# Patient Record
Sex: Female | Born: 1955 | Race: White | Hispanic: No | Marital: Married | Smoking: Former smoker
Health system: Southern US, Community
[De-identification: ages and names within clinical notes are randomized; demographics above are authoritative.]

## PROBLEM LIST (undated history)

## (undated) DIAGNOSIS — Z973 Presence of spectacles and contact lenses: Secondary | ICD-10-CM

## (undated) DIAGNOSIS — I82409 Acute embolism and thrombosis of unspecified deep veins of unspecified lower extremity: Secondary | ICD-10-CM

## (undated) DIAGNOSIS — S82899A Other fracture of unspecified lower leg, initial encounter for closed fracture: Secondary | ICD-10-CM

## (undated) DIAGNOSIS — I469 Cardiac arrest, cause unspecified: Secondary | ICD-10-CM

## (undated) DIAGNOSIS — G4730 Sleep apnea, unspecified: Secondary | ICD-10-CM

## (undated) DIAGNOSIS — I739 Peripheral vascular disease, unspecified: Secondary | ICD-10-CM

## (undated) DIAGNOSIS — I219 Acute myocardial infarction, unspecified: Secondary | ICD-10-CM

## (undated) DIAGNOSIS — I10 Essential (primary) hypertension: Secondary | ICD-10-CM

## (undated) DIAGNOSIS — E119 Type 2 diabetes mellitus without complications: Secondary | ICD-10-CM

## (undated) DIAGNOSIS — F419 Anxiety disorder, unspecified: Secondary | ICD-10-CM

## (undated) DIAGNOSIS — S065X9A Traumatic subdural hemorrhage with loss of consciousness of unspecified duration, initial encounter: Secondary | ICD-10-CM

## (undated) DIAGNOSIS — E7800 Pure hypercholesterolemia, unspecified: Secondary | ICD-10-CM

## (undated) DIAGNOSIS — F32A Depression, unspecified: Secondary | ICD-10-CM

## (undated) DIAGNOSIS — E669 Obesity, unspecified: Secondary | ICD-10-CM

## (undated) DIAGNOSIS — F329 Major depressive disorder, single episode, unspecified: Secondary | ICD-10-CM

## (undated) DIAGNOSIS — I2510 Atherosclerotic heart disease of native coronary artery without angina pectoris: Secondary | ICD-10-CM

## (undated) DIAGNOSIS — N189 Chronic kidney disease, unspecified: Secondary | ICD-10-CM

## (undated) DIAGNOSIS — Z5189 Encounter for other specified aftercare: Secondary | ICD-10-CM

## (undated) DIAGNOSIS — K219 Gastro-esophageal reflux disease without esophagitis: Secondary | ICD-10-CM

## (undated) DIAGNOSIS — T8859XA Other complications of anesthesia, initial encounter: Secondary | ICD-10-CM

## (undated) DIAGNOSIS — I6529 Occlusion and stenosis of unspecified carotid artery: Secondary | ICD-10-CM

## (undated) DIAGNOSIS — I701 Atherosclerosis of renal artery: Secondary | ICD-10-CM

## (undated) DIAGNOSIS — I639 Cerebral infarction, unspecified: Secondary | ICD-10-CM

## (undated) DIAGNOSIS — G931 Anoxic brain damage, not elsewhere classified: Secondary | ICD-10-CM

## (undated) DIAGNOSIS — M7580 Other shoulder lesions, unspecified shoulder: Secondary | ICD-10-CM

## (undated) DIAGNOSIS — T4145XA Adverse effect of unspecified anesthetic, initial encounter: Secondary | ICD-10-CM

## (undated) DIAGNOSIS — K759 Inflammatory liver disease, unspecified: Secondary | ICD-10-CM

## (undated) HISTORY — DX: Obesity, unspecified: E66.9

## (undated) HISTORY — PX: SPINE SURGERY: SHX786

## (undated) HISTORY — PX: CHOLECYSTECTOMY: SHX55

## (undated) HISTORY — DX: Essential (primary) hypertension: I10

## (undated) HISTORY — DX: Acute embolism and thrombosis of unspecified deep veins of unspecified lower extremity: I82.409

## (undated) HISTORY — DX: Occlusion and stenosis of unspecified carotid artery: I65.29

## (undated) HISTORY — DX: Anoxic brain damage, not elsewhere classified: G93.1

## (undated) HISTORY — DX: Other fracture of unspecified lower leg, initial encounter for closed fracture: S82.899A

## (undated) HISTORY — DX: Peripheral vascular disease, unspecified: I73.9

## (undated) HISTORY — PX: ABDOMINOPLASTY/PANNICULECTOMY: SHX5578

## (undated) HISTORY — DX: Acute myocardial infarction, unspecified: I21.9

## (undated) HISTORY — DX: Atherosclerosis of renal artery: I70.1

## (undated) HISTORY — DX: Other shoulder lesions, unspecified shoulder: M75.80

## (undated) HISTORY — DX: Cerebral infarction, unspecified: I63.9

## (undated) HISTORY — PX: POSTERIOR FUSION LUMBAR SPINE: SUR632

## (undated) HISTORY — DX: Anxiety disorder, unspecified: F41.9

## (undated) HISTORY — PX: BACK SURGERY: SHX140

## (undated) HISTORY — DX: Traumatic subdural hemorrhage with loss of consciousness of unspecified duration, initial encounter: S06.5X9A

---

## 1981-10-03 HISTORY — PX: OVARIAN CYST REMOVAL: SHX89

## 1998-10-03 HISTORY — PX: ABDOMINOPLASTY/PANNICULECTOMY: SHX5578

## 1998-10-03 HISTORY — PX: OTHER SURGICAL HISTORY: SHX169

## 2007-04-03 ENCOUNTER — Other Ambulatory Visit: Admission: RE | Admit: 2007-04-03 | Discharge: 2007-04-03 | Payer: Self-pay | Admitting: Family Medicine

## 2007-04-17 ENCOUNTER — Ambulatory Visit (HOSPITAL_COMMUNITY): Admission: RE | Admit: 2007-04-17 | Discharge: 2007-04-17 | Payer: Self-pay | Admitting: Family Medicine

## 2008-02-07 ENCOUNTER — Emergency Department (HOSPITAL_COMMUNITY): Admission: EM | Admit: 2008-02-07 | Discharge: 2008-02-07 | Payer: Self-pay | Admitting: Emergency Medicine

## 2008-06-04 ENCOUNTER — Other Ambulatory Visit: Admission: RE | Admit: 2008-06-04 | Discharge: 2008-06-04 | Payer: Self-pay | Admitting: Family Medicine

## 2009-02-26 ENCOUNTER — Ambulatory Visit (HOSPITAL_COMMUNITY): Admission: RE | Admit: 2009-02-26 | Discharge: 2009-02-26 | Payer: Self-pay | Admitting: Family Medicine

## 2009-10-03 DIAGNOSIS — I639 Cerebral infarction, unspecified: Secondary | ICD-10-CM

## 2009-10-03 HISTORY — DX: Cerebral infarction, unspecified: I63.9

## 2010-06-03 ENCOUNTER — Ambulatory Visit (HOSPITAL_COMMUNITY): Admission: RE | Admit: 2010-06-03 | Discharge: 2010-06-03 | Payer: Self-pay | Admitting: Family Medicine

## 2010-08-03 DIAGNOSIS — G931 Anoxic brain damage, not elsewhere classified: Secondary | ICD-10-CM

## 2010-08-03 HISTORY — DX: Anoxic brain damage, not elsewhere classified: G93.1

## 2010-08-04 DIAGNOSIS — I469 Cardiac arrest, cause unspecified: Secondary | ICD-10-CM

## 2010-08-04 HISTORY — DX: Cardiac arrest, cause unspecified: I46.9

## 2010-09-02 DIAGNOSIS — Z5189 Encounter for other specified aftercare: Secondary | ICD-10-CM

## 2010-09-02 DIAGNOSIS — IMO0001 Reserved for inherently not codable concepts without codable children: Secondary | ICD-10-CM

## 2010-09-02 HISTORY — PX: CORONARY ARTERY BYPASS GRAFT: SHX141

## 2010-09-02 HISTORY — DX: Encounter for other specified aftercare: Z51.89

## 2010-09-02 HISTORY — DX: Reserved for inherently not codable concepts without codable children: IMO0001

## 2010-09-27 DIAGNOSIS — I219 Acute myocardial infarction, unspecified: Secondary | ICD-10-CM

## 2010-09-27 HISTORY — DX: Acute myocardial infarction, unspecified: I21.9

## 2010-10-03 DIAGNOSIS — F419 Anxiety disorder, unspecified: Secondary | ICD-10-CM

## 2010-10-03 HISTORY — DX: Anxiety disorder, unspecified: F41.9

## 2010-10-21 ENCOUNTER — Encounter
Admission: RE | Admit: 2010-10-21 | Discharge: 2010-11-02 | Payer: Self-pay | Source: Home / Self Care | Attending: Family Medicine | Admitting: Family Medicine

## 2010-11-03 ENCOUNTER — Ambulatory Visit: Payer: PRIVATE HEALTH INSURANCE | Attending: Family Medicine | Admitting: Speech Pathology

## 2010-11-03 ENCOUNTER — Ambulatory Visit: Payer: PRIVATE HEALTH INSURANCE | Admitting: Occupational Therapy

## 2010-11-03 DIAGNOSIS — R5381 Other malaise: Secondary | ICD-10-CM | POA: Insufficient documentation

## 2010-11-03 DIAGNOSIS — R269 Unspecified abnormalities of gait and mobility: Secondary | ICD-10-CM | POA: Insufficient documentation

## 2010-11-03 DIAGNOSIS — I69928 Other speech and language deficits following unspecified cerebrovascular disease: Secondary | ICD-10-CM | POA: Insufficient documentation

## 2010-11-03 DIAGNOSIS — Z5189 Encounter for other specified aftercare: Secondary | ICD-10-CM | POA: Insufficient documentation

## 2010-11-05 ENCOUNTER — Ambulatory Visit
Admission: RE | Admit: 2010-11-05 | Discharge: 2010-11-05 | Disposition: A | Discharge status: Home / Self Care | Payer: PRIVATE HEALTH INSURANCE | Source: Ambulatory Visit

## 2010-11-05 ENCOUNTER — Ambulatory Visit: Payer: PRIVATE HEALTH INSURANCE | Admitting: Physical Therapy

## 2010-11-05 ENCOUNTER — Other Ambulatory Visit: Payer: Self-pay | Admitting: Family Medicine

## 2010-11-05 ENCOUNTER — Ambulatory Visit: Payer: PRIVATE HEALTH INSURANCE

## 2010-11-05 ENCOUNTER — Ambulatory Visit: Payer: Self-pay | Admitting: Physical Therapy

## 2010-11-05 ENCOUNTER — Ambulatory Visit: Payer: PRIVATE HEALTH INSURANCE | Admitting: Occupational Therapy

## 2010-11-05 DIAGNOSIS — R05 Cough: Secondary | ICD-10-CM

## 2010-11-05 DIAGNOSIS — R059 Cough, unspecified: Secondary | ICD-10-CM

## 2010-11-09 ENCOUNTER — Ambulatory Visit: Payer: PRIVATE HEALTH INSURANCE | Admitting: Physical Therapy

## 2010-11-09 ENCOUNTER — Ambulatory Visit: Payer: PRIVATE HEALTH INSURANCE

## 2010-11-09 ENCOUNTER — Ambulatory Visit: Payer: PRIVATE HEALTH INSURANCE | Admitting: Occupational Therapy

## 2010-11-09 ENCOUNTER — Ambulatory Visit: Payer: Self-pay | Admitting: Physical Therapy

## 2010-11-11 ENCOUNTER — Ambulatory Visit: Payer: PRIVATE HEALTH INSURANCE | Admitting: Occupational Therapy

## 2010-11-11 ENCOUNTER — Ambulatory Visit: Payer: PRIVATE HEALTH INSURANCE | Admitting: Physical Therapy

## 2010-11-11 ENCOUNTER — Ambulatory Visit: Payer: Self-pay | Admitting: Physical Therapy

## 2010-11-11 ENCOUNTER — Ambulatory Visit: Payer: PRIVATE HEALTH INSURANCE

## 2010-11-12 ENCOUNTER — Ambulatory Visit: Payer: PRIVATE HEALTH INSURANCE | Admitting: Occupational Therapy

## 2010-11-12 ENCOUNTER — Ambulatory Visit: Payer: PRIVATE HEALTH INSURANCE | Admitting: Physical Therapy

## 2010-11-12 ENCOUNTER — Ambulatory Visit: Payer: PRIVATE HEALTH INSURANCE

## 2010-11-15 ENCOUNTER — Ambulatory Visit: Payer: Self-pay | Admitting: Physical Therapy

## 2010-11-15 ENCOUNTER — Ambulatory Visit: Payer: PRIVATE HEALTH INSURANCE

## 2010-11-15 ENCOUNTER — Ambulatory Visit: Payer: PRIVATE HEALTH INSURANCE | Admitting: Physical Therapy

## 2010-11-15 ENCOUNTER — Ambulatory Visit: Payer: PRIVATE HEALTH INSURANCE | Admitting: Occupational Therapy

## 2010-11-16 ENCOUNTER — Ambulatory Visit: Payer: PRIVATE HEALTH INSURANCE | Admitting: Physical Therapy

## 2010-11-16 ENCOUNTER — Ambulatory Visit: Payer: PRIVATE HEALTH INSURANCE

## 2010-11-16 ENCOUNTER — Ambulatory Visit: Payer: PRIVATE HEALTH INSURANCE | Admitting: Occupational Therapy

## 2010-11-16 ENCOUNTER — Ambulatory Visit: Payer: Self-pay | Admitting: Physical Therapy

## 2010-11-18 ENCOUNTER — Ambulatory Visit: Payer: PRIVATE HEALTH INSURANCE | Admitting: Physical Therapy

## 2010-11-18 ENCOUNTER — Ambulatory Visit: Payer: PRIVATE HEALTH INSURANCE | Admitting: Occupational Therapy

## 2010-11-18 ENCOUNTER — Ambulatory Visit: Payer: PRIVATE HEALTH INSURANCE

## 2010-11-23 ENCOUNTER — Ambulatory Visit: Payer: PRIVATE HEALTH INSURANCE

## 2010-11-23 ENCOUNTER — Ambulatory Visit: Payer: PRIVATE HEALTH INSURANCE | Admitting: Physical Therapy

## 2010-11-23 ENCOUNTER — Ambulatory Visit: Payer: PRIVATE HEALTH INSURANCE | Admitting: Occupational Therapy

## 2010-11-25 ENCOUNTER — Encounter: Payer: Self-pay | Admitting: Occupational Therapy

## 2010-11-25 ENCOUNTER — Ambulatory Visit: Payer: Self-pay | Admitting: Physical Therapy

## 2010-11-26 ENCOUNTER — Encounter: Payer: Self-pay | Admitting: Occupational Therapy

## 2010-11-26 ENCOUNTER — Ambulatory Visit: Payer: Self-pay | Admitting: Physical Therapy

## 2010-11-29 ENCOUNTER — Ambulatory Visit: Payer: Self-pay | Admitting: Physical Therapy

## 2010-11-29 ENCOUNTER — Encounter: Payer: Self-pay | Admitting: Occupational Therapy

## 2010-11-30 ENCOUNTER — Ambulatory Visit: Payer: PRIVATE HEALTH INSURANCE

## 2010-11-30 ENCOUNTER — Ambulatory Visit: Payer: PRIVATE HEALTH INSURANCE | Admitting: Physical Therapy

## 2010-11-30 ENCOUNTER — Encounter: Payer: Self-pay | Admitting: Occupational Therapy

## 2010-12-02 ENCOUNTER — Ambulatory Visit: Payer: PRIVATE HEALTH INSURANCE | Attending: Family Medicine | Admitting: Occupational Therapy

## 2010-12-02 ENCOUNTER — Ambulatory Visit: Payer: Self-pay | Admitting: Physical Therapy

## 2010-12-02 ENCOUNTER — Ambulatory Visit: Payer: PRIVATE HEALTH INSURANCE

## 2010-12-02 DIAGNOSIS — I69928 Other speech and language deficits following unspecified cerebrovascular disease: Secondary | ICD-10-CM | POA: Insufficient documentation

## 2010-12-02 DIAGNOSIS — Z5189 Encounter for other specified aftercare: Secondary | ICD-10-CM | POA: Insufficient documentation

## 2010-12-02 DIAGNOSIS — R269 Unspecified abnormalities of gait and mobility: Secondary | ICD-10-CM | POA: Insufficient documentation

## 2010-12-02 DIAGNOSIS — R5381 Other malaise: Secondary | ICD-10-CM | POA: Insufficient documentation

## 2010-12-06 ENCOUNTER — Encounter: Payer: Self-pay | Admitting: Occupational Therapy

## 2010-12-06 ENCOUNTER — Ambulatory Visit: Payer: Self-pay | Admitting: Physical Therapy

## 2010-12-07 ENCOUNTER — Encounter: Payer: Self-pay | Admitting: Occupational Therapy

## 2010-12-07 ENCOUNTER — Ambulatory Visit: Payer: Self-pay | Admitting: Physical Therapy

## 2010-12-09 ENCOUNTER — Encounter: Payer: Self-pay | Admitting: Occupational Therapy

## 2010-12-09 ENCOUNTER — Ambulatory Visit: Payer: Self-pay | Admitting: Physical Therapy

## 2010-12-13 ENCOUNTER — Encounter (HOSPITAL_COMMUNITY): Payer: PRIVATE HEALTH INSURANCE

## 2010-12-13 ENCOUNTER — Ambulatory Visit (HOSPITAL_BASED_OUTPATIENT_CLINIC_OR_DEPARTMENT_OTHER): Payer: PRIVATE HEALTH INSURANCE | Admitting: Physical Medicine & Rehabilitation

## 2010-12-13 ENCOUNTER — Encounter: Payer: PRIVATE HEALTH INSURANCE | Attending: Physical Medicine & Rehabilitation

## 2010-12-13 DIAGNOSIS — F341 Dysthymic disorder: Secondary | ICD-10-CM | POA: Insufficient documentation

## 2010-12-13 DIAGNOSIS — R5381 Other malaise: Secondary | ICD-10-CM | POA: Insufficient documentation

## 2010-12-13 DIAGNOSIS — M6240 Contracture of muscle, unspecified site: Secondary | ICD-10-CM | POA: Insufficient documentation

## 2010-12-13 DIAGNOSIS — M7530 Calcific tendinitis of unspecified shoulder: Secondary | ICD-10-CM

## 2010-12-13 DIAGNOSIS — M67919 Unspecified disorder of synovium and tendon, unspecified shoulder: Secondary | ICD-10-CM | POA: Insufficient documentation

## 2010-12-13 DIAGNOSIS — R5383 Other fatigue: Secondary | ICD-10-CM | POA: Insufficient documentation

## 2010-12-13 DIAGNOSIS — Z951 Presence of aortocoronary bypass graft: Secondary | ICD-10-CM | POA: Insufficient documentation

## 2010-12-13 DIAGNOSIS — M719 Bursopathy, unspecified: Secondary | ICD-10-CM | POA: Insufficient documentation

## 2010-12-13 DIAGNOSIS — G931 Anoxic brain damage, not elsewhere classified: Secondary | ICD-10-CM | POA: Insufficient documentation

## 2010-12-13 DIAGNOSIS — M216X9 Other acquired deformities of unspecified foot: Secondary | ICD-10-CM | POA: Insufficient documentation

## 2010-12-13 DIAGNOSIS — Z5189 Encounter for other specified aftercare: Secondary | ICD-10-CM | POA: Insufficient documentation

## 2010-12-13 DIAGNOSIS — I252 Old myocardial infarction: Secondary | ICD-10-CM | POA: Insufficient documentation

## 2010-12-13 DIAGNOSIS — Z7982 Long term (current) use of aspirin: Secondary | ICD-10-CM | POA: Insufficient documentation

## 2010-12-13 DIAGNOSIS — I10 Essential (primary) hypertension: Secondary | ICD-10-CM | POA: Insufficient documentation

## 2010-12-13 DIAGNOSIS — R413 Other amnesia: Secondary | ICD-10-CM | POA: Insufficient documentation

## 2010-12-13 DIAGNOSIS — M79609 Pain in unspecified limb: Secondary | ICD-10-CM | POA: Insufficient documentation

## 2010-12-13 DIAGNOSIS — Z79899 Other long term (current) drug therapy: Secondary | ICD-10-CM | POA: Insufficient documentation

## 2010-12-13 DIAGNOSIS — E119 Type 2 diabetes mellitus without complications: Secondary | ICD-10-CM | POA: Insufficient documentation

## 2010-12-13 DIAGNOSIS — IMO0002 Reserved for concepts with insufficient information to code with codable children: Secondary | ICD-10-CM

## 2010-12-13 DIAGNOSIS — F09 Unspecified mental disorder due to known physiological condition: Secondary | ICD-10-CM | POA: Insufficient documentation

## 2010-12-14 ENCOUNTER — Ambulatory Visit: Payer: PRIVATE HEALTH INSURANCE

## 2010-12-14 ENCOUNTER — Ambulatory Visit: Payer: PRIVATE HEALTH INSURANCE | Admitting: Physical Therapy

## 2010-12-14 ENCOUNTER — Ambulatory Visit: Payer: PRIVATE HEALTH INSURANCE | Admitting: Occupational Therapy

## 2010-12-15 ENCOUNTER — Other Ambulatory Visit: Payer: Self-pay

## 2010-12-15 ENCOUNTER — Encounter (HOSPITAL_COMMUNITY): Payer: PRIVATE HEALTH INSURANCE | Attending: Cardiovascular Disease

## 2010-12-15 ENCOUNTER — Encounter (HOSPITAL_COMMUNITY): Payer: PRIVATE HEALTH INSURANCE

## 2010-12-15 DIAGNOSIS — I252 Old myocardial infarction: Secondary | ICD-10-CM | POA: Insufficient documentation

## 2010-12-15 DIAGNOSIS — G931 Anoxic brain damage, not elsewhere classified: Secondary | ICD-10-CM | POA: Insufficient documentation

## 2010-12-15 DIAGNOSIS — Z5189 Encounter for other specified aftercare: Secondary | ICD-10-CM | POA: Insufficient documentation

## 2010-12-15 DIAGNOSIS — Z951 Presence of aortocoronary bypass graft: Secondary | ICD-10-CM | POA: Insufficient documentation

## 2010-12-16 ENCOUNTER — Ambulatory Visit: Payer: PRIVATE HEALTH INSURANCE | Admitting: Physical Therapy

## 2010-12-16 ENCOUNTER — Encounter: Payer: PRIVATE HEALTH INSURANCE | Admitting: Occupational Therapy

## 2010-12-16 LAB — GLUCOSE, CAPILLARY: Glucose-Capillary: 125 mg/dL — ABNORMAL HIGH (ref 70–99)

## 2010-12-17 ENCOUNTER — Encounter (HOSPITAL_COMMUNITY): Payer: PRIVATE HEALTH INSURANCE

## 2010-12-17 ENCOUNTER — Other Ambulatory Visit: Payer: Self-pay

## 2010-12-20 ENCOUNTER — Encounter (HOSPITAL_COMMUNITY): Payer: PRIVATE HEALTH INSURANCE

## 2010-12-20 ENCOUNTER — Ambulatory Visit (HOSPITAL_BASED_OUTPATIENT_CLINIC_OR_DEPARTMENT_OTHER): Payer: PRIVATE HEALTH INSURANCE | Admitting: Neurosurgery

## 2010-12-20 DIAGNOSIS — M19029 Primary osteoarthritis, unspecified elbow: Secondary | ICD-10-CM

## 2010-12-21 ENCOUNTER — Ambulatory Visit: Payer: PRIVATE HEALTH INSURANCE

## 2010-12-21 ENCOUNTER — Ambulatory Visit: Payer: PRIVATE HEALTH INSURANCE | Admitting: Occupational Therapy

## 2010-12-22 ENCOUNTER — Encounter (HOSPITAL_COMMUNITY): Payer: PRIVATE HEALTH INSURANCE

## 2010-12-22 ENCOUNTER — Other Ambulatory Visit: Payer: Self-pay | Admitting: Cardiovascular Disease

## 2010-12-22 ENCOUNTER — Encounter (HOSPITAL_COMMUNITY): Payer: PRIVATE HEALTH INSURANCE | Attending: Cardiovascular Disease

## 2010-12-22 DIAGNOSIS — Z951 Presence of aortocoronary bypass graft: Secondary | ICD-10-CM | POA: Insufficient documentation

## 2010-12-22 DIAGNOSIS — I252 Old myocardial infarction: Secondary | ICD-10-CM | POA: Insufficient documentation

## 2010-12-22 DIAGNOSIS — Z5189 Encounter for other specified aftercare: Secondary | ICD-10-CM | POA: Insufficient documentation

## 2010-12-22 DIAGNOSIS — G931 Anoxic brain damage, not elsewhere classified: Secondary | ICD-10-CM | POA: Insufficient documentation

## 2010-12-22 LAB — GLUCOSE, CAPILLARY: Glucose-Capillary: 100 mg/dL — ABNORMAL HIGH (ref 70–99)

## 2010-12-24 ENCOUNTER — Encounter (HOSPITAL_COMMUNITY): Payer: PRIVATE HEALTH INSURANCE

## 2010-12-27 ENCOUNTER — Ambulatory Visit: Payer: PRIVATE HEALTH INSURANCE | Admitting: Physical Therapy

## 2010-12-27 ENCOUNTER — Encounter (HOSPITAL_COMMUNITY): Payer: PRIVATE HEALTH INSURANCE

## 2010-12-27 ENCOUNTER — Ambulatory Visit (HOSPITAL_BASED_OUTPATIENT_CLINIC_OR_DEPARTMENT_OTHER): Payer: PRIVATE HEALTH INSURANCE | Admitting: Neurosurgery

## 2010-12-27 ENCOUNTER — Encounter: Payer: PRIVATE HEALTH INSURANCE | Attending: Neurosurgery

## 2010-12-27 DIAGNOSIS — M25519 Pain in unspecified shoulder: Secondary | ICD-10-CM | POA: Insufficient documentation

## 2010-12-27 DIAGNOSIS — I252 Old myocardial infarction: Secondary | ICD-10-CM | POA: Insufficient documentation

## 2010-12-27 DIAGNOSIS — F329 Major depressive disorder, single episode, unspecified: Secondary | ICD-10-CM | POA: Insufficient documentation

## 2010-12-27 DIAGNOSIS — F3289 Other specified depressive episodes: Secondary | ICD-10-CM | POA: Insufficient documentation

## 2010-12-27 DIAGNOSIS — M6240 Contracture of muscle, unspecified site: Secondary | ICD-10-CM | POA: Insufficient documentation

## 2010-12-27 DIAGNOSIS — M719 Bursopathy, unspecified: Secondary | ICD-10-CM | POA: Insufficient documentation

## 2010-12-27 DIAGNOSIS — G931 Anoxic brain damage, not elsewhere classified: Secondary | ICD-10-CM | POA: Insufficient documentation

## 2010-12-27 DIAGNOSIS — M25529 Pain in unspecified elbow: Secondary | ICD-10-CM

## 2010-12-27 DIAGNOSIS — M67919 Unspecified disorder of synovium and tendon, unspecified shoulder: Secondary | ICD-10-CM | POA: Insufficient documentation

## 2010-12-27 DIAGNOSIS — M19029 Primary osteoarthritis, unspecified elbow: Secondary | ICD-10-CM

## 2010-12-28 ENCOUNTER — Ambulatory Visit: Payer: PRIVATE HEALTH INSURANCE | Admitting: Occupational Therapy

## 2010-12-28 ENCOUNTER — Ambulatory Visit: Payer: PRIVATE HEALTH INSURANCE | Admitting: Physical Therapy

## 2010-12-28 ENCOUNTER — Ambulatory Visit: Payer: PRIVATE HEALTH INSURANCE

## 2010-12-29 ENCOUNTER — Encounter (HOSPITAL_COMMUNITY): Payer: PRIVATE HEALTH INSURANCE

## 2010-12-31 ENCOUNTER — Encounter (HOSPITAL_COMMUNITY): Payer: PRIVATE HEALTH INSURANCE

## 2011-01-03 ENCOUNTER — Encounter (HOSPITAL_COMMUNITY): Payer: PRIVATE HEALTH INSURANCE

## 2011-01-03 ENCOUNTER — Ambulatory Visit: Payer: PRIVATE HEALTH INSURANCE | Admitting: Occupational Therapy

## 2011-01-03 ENCOUNTER — Ambulatory Visit: Payer: PRIVATE HEALTH INSURANCE | Attending: Family Medicine

## 2011-01-03 DIAGNOSIS — Z5189 Encounter for other specified aftercare: Secondary | ICD-10-CM | POA: Insufficient documentation

## 2011-01-03 DIAGNOSIS — I69928 Other speech and language deficits following unspecified cerebrovascular disease: Secondary | ICD-10-CM | POA: Insufficient documentation

## 2011-01-03 DIAGNOSIS — R269 Unspecified abnormalities of gait and mobility: Secondary | ICD-10-CM | POA: Insufficient documentation

## 2011-01-03 DIAGNOSIS — R5381 Other malaise: Secondary | ICD-10-CM | POA: Insufficient documentation

## 2011-01-04 ENCOUNTER — Other Ambulatory Visit: Payer: Self-pay | Admitting: Physical Medicine & Rehabilitation

## 2011-01-04 DIAGNOSIS — M25519 Pain in unspecified shoulder: Secondary | ICD-10-CM

## 2011-01-04 DIAGNOSIS — M1990 Unspecified osteoarthritis, unspecified site: Secondary | ICD-10-CM

## 2011-01-05 ENCOUNTER — Encounter (HOSPITAL_COMMUNITY): Payer: PRIVATE HEALTH INSURANCE

## 2011-01-05 ENCOUNTER — Encounter (HOSPITAL_COMMUNITY): Payer: PRIVATE HEALTH INSURANCE | Attending: Cardiovascular Disease

## 2011-01-05 DIAGNOSIS — G931 Anoxic brain damage, not elsewhere classified: Secondary | ICD-10-CM | POA: Insufficient documentation

## 2011-01-05 DIAGNOSIS — I252 Old myocardial infarction: Secondary | ICD-10-CM | POA: Insufficient documentation

## 2011-01-05 DIAGNOSIS — Z5189 Encounter for other specified aftercare: Secondary | ICD-10-CM | POA: Insufficient documentation

## 2011-01-05 DIAGNOSIS — Z951 Presence of aortocoronary bypass graft: Secondary | ICD-10-CM | POA: Insufficient documentation

## 2011-01-07 ENCOUNTER — Encounter (HOSPITAL_COMMUNITY): Payer: PRIVATE HEALTH INSURANCE

## 2011-01-10 ENCOUNTER — Encounter (HOSPITAL_COMMUNITY): Payer: PRIVATE HEALTH INSURANCE

## 2011-01-11 ENCOUNTER — Ambulatory Visit (HOSPITAL_COMMUNITY)
Admission: RE | Admit: 2011-01-11 | Discharge: 2011-01-11 | Disposition: A | Discharge status: Home / Self Care | Payer: PRIVATE HEALTH INSURANCE | Source: Ambulatory Visit | Attending: Physical Medicine & Rehabilitation | Admitting: Physical Medicine & Rehabilitation

## 2011-01-11 DIAGNOSIS — M25519 Pain in unspecified shoulder: Secondary | ICD-10-CM | POA: Insufficient documentation

## 2011-01-11 DIAGNOSIS — M1990 Unspecified osteoarthritis, unspecified site: Secondary | ICD-10-CM

## 2011-01-11 DIAGNOSIS — M719 Bursopathy, unspecified: Secondary | ICD-10-CM | POA: Insufficient documentation

## 2011-01-11 DIAGNOSIS — M67919 Unspecified disorder of synovium and tendon, unspecified shoulder: Secondary | ICD-10-CM | POA: Insufficient documentation

## 2011-01-12 ENCOUNTER — Encounter (HOSPITAL_COMMUNITY): Payer: PRIVATE HEALTH INSURANCE

## 2011-01-14 ENCOUNTER — Encounter (HOSPITAL_COMMUNITY): Payer: PRIVATE HEALTH INSURANCE

## 2011-01-17 ENCOUNTER — Encounter (HOSPITAL_COMMUNITY): Payer: PRIVATE HEALTH INSURANCE

## 2011-01-19 ENCOUNTER — Encounter (HOSPITAL_COMMUNITY): Payer: PRIVATE HEALTH INSURANCE

## 2011-01-21 ENCOUNTER — Encounter (HOSPITAL_COMMUNITY): Payer: PRIVATE HEALTH INSURANCE

## 2011-01-24 ENCOUNTER — Other Ambulatory Visit: Payer: Self-pay | Admitting: Family Medicine

## 2011-01-24 ENCOUNTER — Encounter (HOSPITAL_COMMUNITY): Payer: PRIVATE HEALTH INSURANCE

## 2011-01-24 ENCOUNTER — Encounter
Payer: PRIVATE HEALTH INSURANCE | Attending: Physical Medicine & Rehabilitation | Admitting: Physical Medicine & Rehabilitation

## 2011-01-24 DIAGNOSIS — F481 Depersonalization-derealization syndrome: Secondary | ICD-10-CM

## 2011-01-24 DIAGNOSIS — M67919 Unspecified disorder of synovium and tendon, unspecified shoulder: Secondary | ICD-10-CM | POA: Insufficient documentation

## 2011-01-24 DIAGNOSIS — G931 Anoxic brain damage, not elsewhere classified: Secondary | ICD-10-CM | POA: Insufficient documentation

## 2011-01-24 DIAGNOSIS — R059 Cough, unspecified: Secondary | ICD-10-CM

## 2011-01-24 DIAGNOSIS — F341 Dysthymic disorder: Secondary | ICD-10-CM | POA: Insufficient documentation

## 2011-01-24 DIAGNOSIS — R05 Cough: Secondary | ICD-10-CM

## 2011-01-24 DIAGNOSIS — M7530 Calcific tendinitis of unspecified shoulder: Secondary | ICD-10-CM

## 2011-01-24 DIAGNOSIS — M719 Bursopathy, unspecified: Secondary | ICD-10-CM | POA: Insufficient documentation

## 2011-01-24 DIAGNOSIS — M79609 Pain in unspecified limb: Secondary | ICD-10-CM | POA: Insufficient documentation

## 2011-01-24 DIAGNOSIS — IMO0002 Reserved for concepts with insufficient information to code with codable children: Secondary | ICD-10-CM

## 2011-01-25 NOTE — Assessment & Plan Note (Signed)
Alicia Crawford is back regarding her anoxic brain injury as well as her right shoulder.  She had about 2 weeks of relief with the last shoulder injection.  MRI was done showing a small tear of the distal supraspinatus tendon and then some bursitis in the subacromial and subdeltoid space.  I called her about the results the mid last week when we received the study.  Today, her pain is about 6/10.  The Mobic helps a bit.  Pain bothers her with overhead activities and rotation of the arm.  Cognitively, she is improving with better memory and insight.  She still lacks some short-term memory, but copes with that using a calendar and other memory devices.  REVIEW OF SYSTEMS:  Notable for the above.  Full 12-point review is in the written health and history section of the chart.  She does report some bladder control issues and occasional coughing.  SOCIAL HISTORY:  The patient is married.  She is alone here in the office today.  Her nephew drove her here.  PHYSICAL EXAMINATION:  Blood pressure is 139/76, pulse 60, respiratory rate 18.  She is sating 97% on room air.  The patient is pleasant, alert and oriented x3.  Affect is generally bright and appropriate.  She is not anxious.  She moves all four extremities and limited pain except for the right shoulder.  It was painful with abduction as well as external rotation.  Impingement maneuver is positive.  She has some pain with pressure in the subacromial space.  She did have some tenderness over the tibialis anterior on the right side.  ASSESSMENT: 1. Anoxic brain injury. 2. Right rotator cuff tendonitis/bursitis. 3. Depression with anxiety, improved as cognition is improving. 4. Right shin pain in the tibialis anterior region likely due to     persistent muscle weakness there and exercise effect.  PLAN: 1. Continued stretching for the right leg before and after exercise.     Encouraged use of heat before and ice afterwards.  This should  improve with gradual strengthening of the limb and appropriate     mechanics.  She needs to wear appropriate shoe wear with good     medial arch support as well. 2. I will send the patient back to physical therapy for rotator cuff     strengthening exercise as well as scapular stabilizer exercises. 3. After informed consent, we injected the right shoulder via lateral     approach with 40 mg Kenalog and 3 mL of 1% lidocaine.  The patient     tolerated well. 4. Continue Mobic for now. 5. I will see her back here in about 2 month's time.  She will call me     with any problems or questions.     Ranelle Oyster, M.D. Electronically Signed    ZTS/MedQ D:  01/24/2011 13:25:21  T:  01/25/2011 01:00:46  Job #:  [id number]  cc:   Otilio Connors. Gerri Spore, M.D. Fax: [phone]

## 2011-01-26 ENCOUNTER — Encounter (HOSPITAL_COMMUNITY): Payer: PRIVATE HEALTH INSURANCE

## 2011-01-28 ENCOUNTER — Encounter (HOSPITAL_COMMUNITY): Payer: PRIVATE HEALTH INSURANCE

## 2011-01-31 ENCOUNTER — Encounter (HOSPITAL_COMMUNITY): Payer: PRIVATE HEALTH INSURANCE

## 2011-01-31 NOTE — Group Therapy Note (Signed)
CHIEF COMPLAINT:  Weakness, arm pain and cognitive deficits.  HISTORY OF PRESENT ILLNESS:  This is a pleasant 55 year old white female who suffered myocardial infarction on November 4, while visiting in [place].  The patient had significant anoxia secondary to ventricular fibrillation arrest.  She also suffered acute respiratory failure and V- tach, postoperative anemia.  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  The patient was discharged to home from [facility].  It sounds as if she was in quite ready to be home with that point.  Fortunately, the patient showed some improvement from a cognitive functional standpoint and is now doing quite a bit better. She is still receiving some therapy through the Outpatient Center over at [facility].  Mrs. Andrew biggest complaints today include memory and higher-level cognitive function issues.  She still has "drop foot" right lower extremity and complains of generalized weakness as well as significant pain in the arms.  The pain seems to be most prominent in the proximal area just at the distal end of the deltoid and into the triceps region. She has problems putting her bra, doing things overhead.  She cannot sleep on her side.  It was thought that perhaps her Lipitor was causing myalgias and this was stopped 4 days ago.  She has been using some ibuprofen up to 600 mg a day with some relief.  She rates her pain now 1/10, describes as constant aching.  Sleep is fair.  The patient was limited for some time due to bedrest and her critical illness as well as the postoperative restrictions from her four-vessel CABG.  She just was given clearance not to longer to move her arms without limitations.  CURRENT MEDICATIONS: 1. Sertraline 100 mg one and a half daily. 2. Lipitor 20 mg which was on hold. 3. Aspirin 325 mg daily. 4. Ibuprofen 200 mg q.8  h.  P.r.n. 5. Protonix 40 mg daily. 6. Metoprolol 25 mg two times a day. 7. Losartan 50 mg a day. 8. Amiodarone 200 mg day. 9. Lorazepam, 1 pill 2 at night, once in the morning.  ALLERGIES:  HALOTHANE which cause the increased liver enzymes.  PAST MEDICAL HISTORY:  Notable for the above as well as history of gallbladder surgery and ovarian cyst removal in the distant past.  She had lumbar spine surgery over at least 10 years ago in [place], [place] and had drop foot associated with the back problem which improved after surgery.  She had a stomach and skin tuck 10 years ago. Diet-controlled diabetes and hypertension.  SOCIAL HISTORY:  The patient is married, living with her husband. Nephew accompanies her today.  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 She currently needs assistance with meal prep, household duties, shopping and home.  REVIEW OF SYSTEMS:  Notable for the above as well as some anxiety and depression.  Full 12-point review is in the written health history section of the chart.  FAMILY HISTORY:  Positive for heart disease, diabetes, high blood pressure, psychiatric issues, and alcohol abuse.  PHYSICAL EXAMINATION:  Blood pressure is 156/75, pulse 77, respiratory rate 18.  She is sating 99% on room air.  The patient is pleasant, alert and oriented x2.  The patient cues for a date, but knew her name, essentially why she was here.  The patient's heart has a regular rate and rhythm.  Chest was clear.  Abdomen is soft, nontender.  The patient walk for me today as a bit of a steppage gait on the right side, but seems fairly functional.  Posture and sitting and standing was fair. Strength is grossly 5/5 in all muscle groups tested today.  I saw no focal sensory loss.  Right heel cord was tight as only able to passively dorsiflex it to -7 or 8 degrees from neutral.  SKIN appeared to be intact throughout  on examination today.  Moving to the upper extremities, the shoulders were painful with impingement testing. Apprehension test caused some discomfort as well.  She was limited overall with active abduction to about 80-90 degrees.  All the maneuvers today produced pain in her typical pain complaint distribution.  Cranial nerve testing was normal.  We tried simple math questions that she had some problems with sequencing numbers.  She was able to spell the world forward, but not backwards.  She had intact abstract thinking.  She is able to follow all simple commands but had difficulty with more complex equations.  ASSESSMENT: 1. Anoxic brain injury after myocardial infarction/coronary artery     bypass graft associated with prolonged hospital stay. 2. Bilateral rotator cuff tendonitis/bursitis. 3. Depression/anxiety disorder, likely worsened by her higher-level     memory and cognitive deficits. 4. Right heel cord tightness.  PLAN: 1. Therapy is looking at a AFO for the right lower extremity.  This     could help.  I am not sure exactly what type has been ordered.  We     will need to discuss with them.  I think more than anything else     for the ankle she needs to work on range of motion, stretching     which we discussed today. 2. We will send the patient back for bilateral upper extremity rotator     cuff and scapular stabilizer stretching, strengthening, range of     motion and modalities with home exercise program to be established.     If she fails therapy will need to look at potentially injecting the     shoulders, although I do not want to do this considering her     diabetes.  I did give the patient some tramadol to use for more     severe pain which she can try before therapies or at night.  She     can stay with ibuprofen as well as long as this is okay with her     Primary Medical Team.  Tylenol is also an option. 3. Encouraged ongoing cognitive exercises at home to work  on memory,     concentration, focus etc..  She would benefit in an ideal situation     from a stimulant, but considering her cardiac history, this is not     an option. 4. I will see the patient back in about 4-6 weeks' time.     Ranelle Oyster, M.D. Electronically Signed    ZTS/MedQ D:  12/13/2010 11:16:35  T:  12/13/2010 21:31:08  Job #:  [id number]  cc:   Nanetta Batty, M.D. Fax: [phone]  Carola J. Gerri Spore, M.D. Fax: [phone]

## 2011-02-02 ENCOUNTER — Encounter (HOSPITAL_COMMUNITY): Payer: PRIVATE HEALTH INSURANCE | Attending: Cardiovascular Disease

## 2011-02-02 ENCOUNTER — Encounter (HOSPITAL_COMMUNITY): Payer: PRIVATE HEALTH INSURANCE

## 2011-02-02 DIAGNOSIS — I252 Old myocardial infarction: Secondary | ICD-10-CM | POA: Insufficient documentation

## 2011-02-02 DIAGNOSIS — Z5189 Encounter for other specified aftercare: Secondary | ICD-10-CM | POA: Insufficient documentation

## 2011-02-02 DIAGNOSIS — G931 Anoxic brain damage, not elsewhere classified: Secondary | ICD-10-CM | POA: Insufficient documentation

## 2011-02-02 DIAGNOSIS — Z951 Presence of aortocoronary bypass graft: Secondary | ICD-10-CM | POA: Insufficient documentation

## 2011-02-03 ENCOUNTER — Ambulatory Visit: Payer: PRIVATE HEALTH INSURANCE | Admitting: Physical Medicine & Rehabilitation

## 2011-02-04 ENCOUNTER — Encounter (HOSPITAL_COMMUNITY): Payer: PRIVATE HEALTH INSURANCE

## 2011-02-07 ENCOUNTER — Encounter (HOSPITAL_COMMUNITY): Payer: PRIVATE HEALTH INSURANCE

## 2011-02-09 ENCOUNTER — Encounter (HOSPITAL_COMMUNITY): Payer: PRIVATE HEALTH INSURANCE

## 2011-02-11 ENCOUNTER — Encounter (HOSPITAL_COMMUNITY): Payer: PRIVATE HEALTH INSURANCE

## 2011-02-14 ENCOUNTER — Encounter (HOSPITAL_COMMUNITY): Payer: PRIVATE HEALTH INSURANCE

## 2011-02-16 ENCOUNTER — Encounter (HOSPITAL_COMMUNITY): Payer: PRIVATE HEALTH INSURANCE

## 2011-02-16 ENCOUNTER — Other Ambulatory Visit: Payer: Self-pay | Admitting: Cardiovascular Disease

## 2011-02-16 LAB — GLUCOSE, CAPILLARY: Glucose-Capillary: 106 mg/dL — ABNORMAL HIGH (ref 70–99)

## 2011-02-18 ENCOUNTER — Other Ambulatory Visit: Payer: Self-pay | Admitting: Cardiovascular Disease

## 2011-02-18 ENCOUNTER — Encounter (HOSPITAL_COMMUNITY): Payer: PRIVATE HEALTH INSURANCE

## 2011-02-18 LAB — GLUCOSE, CAPILLARY: Glucose-Capillary: 94 mg/dL (ref 70–99)

## 2011-02-21 ENCOUNTER — Encounter (HOSPITAL_COMMUNITY): Payer: PRIVATE HEALTH INSURANCE

## 2011-02-23 ENCOUNTER — Encounter (HOSPITAL_COMMUNITY): Payer: PRIVATE HEALTH INSURANCE

## 2011-02-25 ENCOUNTER — Encounter (HOSPITAL_COMMUNITY): Payer: PRIVATE HEALTH INSURANCE

## 2011-02-28 ENCOUNTER — Encounter (HOSPITAL_COMMUNITY): Payer: PRIVATE HEALTH INSURANCE

## 2011-03-02 ENCOUNTER — Encounter (HOSPITAL_COMMUNITY): Payer: PRIVATE HEALTH INSURANCE

## 2011-03-04 ENCOUNTER — Encounter (HOSPITAL_COMMUNITY): Payer: PRIVATE HEALTH INSURANCE

## 2011-03-04 ENCOUNTER — Encounter (HOSPITAL_COMMUNITY): Payer: PRIVATE HEALTH INSURANCE | Attending: Cardiovascular Disease

## 2011-03-04 DIAGNOSIS — I252 Old myocardial infarction: Secondary | ICD-10-CM | POA: Insufficient documentation

## 2011-03-04 DIAGNOSIS — G931 Anoxic brain damage, not elsewhere classified: Secondary | ICD-10-CM | POA: Insufficient documentation

## 2011-03-04 DIAGNOSIS — Z951 Presence of aortocoronary bypass graft: Secondary | ICD-10-CM | POA: Insufficient documentation

## 2011-03-04 DIAGNOSIS — Z5189 Encounter for other specified aftercare: Secondary | ICD-10-CM | POA: Insufficient documentation

## 2011-03-07 ENCOUNTER — Encounter (HOSPITAL_COMMUNITY): Payer: PRIVATE HEALTH INSURANCE

## 2011-03-09 ENCOUNTER — Encounter (HOSPITAL_COMMUNITY): Payer: PRIVATE HEALTH INSURANCE

## 2011-03-11 ENCOUNTER — Encounter (HOSPITAL_COMMUNITY): Payer: PRIVATE HEALTH INSURANCE

## 2011-03-14 ENCOUNTER — Encounter (HOSPITAL_COMMUNITY): Payer: PRIVATE HEALTH INSURANCE

## 2011-03-14 ENCOUNTER — Ambulatory Visit: Payer: PRIVATE HEALTH INSURANCE | Attending: Family Medicine

## 2011-03-14 DIAGNOSIS — R5381 Other malaise: Secondary | ICD-10-CM | POA: Insufficient documentation

## 2011-03-14 DIAGNOSIS — Z5189 Encounter for other specified aftercare: Secondary | ICD-10-CM | POA: Insufficient documentation

## 2011-03-14 DIAGNOSIS — R269 Unspecified abnormalities of gait and mobility: Secondary | ICD-10-CM | POA: Insufficient documentation

## 2011-03-14 DIAGNOSIS — I69928 Other speech and language deficits following unspecified cerebrovascular disease: Secondary | ICD-10-CM | POA: Insufficient documentation

## 2011-03-16 ENCOUNTER — Encounter (HOSPITAL_COMMUNITY): Payer: PRIVATE HEALTH INSURANCE

## 2011-03-18 ENCOUNTER — Encounter (HOSPITAL_COMMUNITY): Payer: PRIVATE HEALTH INSURANCE

## 2011-03-21 ENCOUNTER — Encounter (HOSPITAL_COMMUNITY): Payer: PRIVATE HEALTH INSURANCE

## 2011-03-22 ENCOUNTER — Encounter: Payer: PRIVATE HEALTH INSURANCE | Admitting: Physical Medicine & Rehabilitation

## 2011-03-23 ENCOUNTER — Encounter (HOSPITAL_COMMUNITY): Payer: PRIVATE HEALTH INSURANCE

## 2011-03-24 ENCOUNTER — Ambulatory Visit: Payer: PRIVATE HEALTH INSURANCE

## 2011-03-25 ENCOUNTER — Encounter (HOSPITAL_COMMUNITY): Payer: PRIVATE HEALTH INSURANCE

## 2011-03-28 ENCOUNTER — Encounter (HOSPITAL_COMMUNITY): Payer: PRIVATE HEALTH INSURANCE

## 2011-03-29 ENCOUNTER — Encounter
Payer: PRIVATE HEALTH INSURANCE | Attending: Physical Medicine & Rehabilitation | Admitting: Physical Medicine & Rehabilitation

## 2011-03-29 DIAGNOSIS — G931 Anoxic brain damage, not elsewhere classified: Secondary | ICD-10-CM | POA: Insufficient documentation

## 2011-03-29 DIAGNOSIS — M719 Bursopathy, unspecified: Secondary | ICD-10-CM | POA: Insufficient documentation

## 2011-03-29 DIAGNOSIS — M79609 Pain in unspecified limb: Secondary | ICD-10-CM | POA: Insufficient documentation

## 2011-03-29 DIAGNOSIS — F341 Dysthymic disorder: Secondary | ICD-10-CM | POA: Insufficient documentation

## 2011-03-29 DIAGNOSIS — M25519 Pain in unspecified shoulder: Secondary | ICD-10-CM | POA: Insufficient documentation

## 2011-03-29 DIAGNOSIS — IMO0002 Reserved for concepts with insufficient information to code with codable children: Secondary | ICD-10-CM

## 2011-03-29 DIAGNOSIS — F481 Depersonalization-derealization syndrome: Secondary | ICD-10-CM

## 2011-03-29 DIAGNOSIS — M7530 Calcific tendinitis of unspecified shoulder: Secondary | ICD-10-CM

## 2011-03-29 DIAGNOSIS — M67919 Unspecified disorder of synovium and tendon, unspecified shoulder: Secondary | ICD-10-CM | POA: Insufficient documentation

## 2011-03-30 ENCOUNTER — Encounter (HOSPITAL_COMMUNITY): Payer: PRIVATE HEALTH INSURANCE

## 2011-03-30 NOTE — Assessment & Plan Note (Signed)
Alicia Crawford is back regarding her anoxic brain injury as well as her right shoulder pain.  She had some results of the right shoulder injection.  Unfortunately, she had problems with insurance approval and has just begun physical therapy on the shoulder last week.  She reports occasional loss of balance when she is up standing.  She denies any dizziness or vertigo like symptoms.  Does have occasional calf pain.  REVIEW OF SYSTEMS:  Notable for the above.  Full 12-point review is in the written health and history section of the chart.  She has been on the Mobic as well as Ultram for breakthrough symptoms.  SOCIAL HISTORY:  The patient is married.  No other changes noted here.  PHYSICAL EXAMINATION:  VITAL SIGNS:  Blood pressure 148/93, pulse is 57, respiratory rate 18, she is satting 100% on room air. GENERAL:  The patient is pleasant and alert.  She still has some problems with attention and memory, but to a large extent has improved. MUSCULOSKELETAL:  She has some pain in the right shoulder with straight arm raising as well as impingement testing.  There is some mild tenderness over the biceps tendons and along the subacromial bursa. Left shoulder is minimally tender with rotator cuff maneuvers, however, triceps seemed to be a bit sore with resistance as well as shoulder extension.  Balance was notable for a narrow stride width.  She had some difficulty with changing directions.  She was wearing flip-flops, however which complicated matters.  When she ambulated without these, she was much more steady.  ASSESSMENT: 1. Anoxic brain injury. 2. Right rotator cuff tendonitis/bursitis. 3. Depression with anxiety.  PLAN: 1. I would hesitate injecting her again at this point as I feel that     she is improved.  She needs to really go to the physical therapy to     work on shoulder range of motion and strengthening and scapular     stabilization. 2. I urge patient to wear more appropriate  shoes.  She is at risk for     falling with her flip-flop/sandals.  I think some of her balance     loss is due to lack of overall coordination in the setting of     attentional issues.  She might be a candidate for aquatic physical     therapy which we could pursue later this summer. 3. We will stop Mobic as her blood pressure was elevated today. 4. She will use Ultram for breakthrough pain. 5. I will see her back here in about 2 months.     Alicia Crawford, M.D. Electronically Signed    ZTS/MedQ D:  03/29/2011 10:21:16  T:  03/29/2011 22:17:38  Job #:  [id number]  cc:   Otilio Connors. Gerri Spore, M.D. Fax: [phone]  Nanetta Batty, M.D. Fax: [phone]

## 2011-03-31 ENCOUNTER — Ambulatory Visit: Payer: PRIVATE HEALTH INSURANCE

## 2011-04-01 ENCOUNTER — Encounter (HOSPITAL_COMMUNITY): Payer: PRIVATE HEALTH INSURANCE

## 2011-04-04 ENCOUNTER — Encounter (HOSPITAL_COMMUNITY): Payer: PRIVATE HEALTH INSURANCE

## 2011-04-04 ENCOUNTER — Ambulatory Visit: Payer: PRIVATE HEALTH INSURANCE | Attending: Family Medicine

## 2011-04-04 ENCOUNTER — Encounter (HOSPITAL_COMMUNITY): Payer: PRIVATE HEALTH INSURANCE | Attending: Cardiovascular Disease

## 2011-04-04 DIAGNOSIS — R269 Unspecified abnormalities of gait and mobility: Secondary | ICD-10-CM | POA: Insufficient documentation

## 2011-04-04 DIAGNOSIS — I69928 Other speech and language deficits following unspecified cerebrovascular disease: Secondary | ICD-10-CM | POA: Insufficient documentation

## 2011-04-04 DIAGNOSIS — Z5189 Encounter for other specified aftercare: Secondary | ICD-10-CM | POA: Insufficient documentation

## 2011-04-04 DIAGNOSIS — R5381 Other malaise: Secondary | ICD-10-CM | POA: Insufficient documentation

## 2011-04-04 DIAGNOSIS — G931 Anoxic brain damage, not elsewhere classified: Secondary | ICD-10-CM | POA: Insufficient documentation

## 2011-04-04 DIAGNOSIS — Z951 Presence of aortocoronary bypass graft: Secondary | ICD-10-CM | POA: Insufficient documentation

## 2011-04-04 DIAGNOSIS — I252 Old myocardial infarction: Secondary | ICD-10-CM | POA: Insufficient documentation

## 2011-04-06 ENCOUNTER — Encounter (HOSPITAL_COMMUNITY): Payer: PRIVATE HEALTH INSURANCE

## 2011-04-08 ENCOUNTER — Encounter (HOSPITAL_COMMUNITY): Payer: PRIVATE HEALTH INSURANCE

## 2011-04-11 ENCOUNTER — Encounter (HOSPITAL_COMMUNITY): Payer: PRIVATE HEALTH INSURANCE

## 2011-04-13 ENCOUNTER — Encounter (HOSPITAL_COMMUNITY): Payer: PRIVATE HEALTH INSURANCE

## 2011-04-15 ENCOUNTER — Encounter (HOSPITAL_COMMUNITY): Payer: PRIVATE HEALTH INSURANCE

## 2011-04-18 ENCOUNTER — Ambulatory Visit: Payer: PRIVATE HEALTH INSURANCE

## 2011-04-20 ENCOUNTER — Encounter: Payer: PRIVATE HEALTH INSURANCE | Admitting: Physical Therapy

## 2011-04-28 ENCOUNTER — Emergency Department (HOSPITAL_COMMUNITY)
Admission: EM | Admit: 2011-04-28 | Discharge: 2011-04-28 | Disposition: A | Discharge status: Home / Self Care | Payer: PRIVATE HEALTH INSURANCE | Attending: Emergency Medicine | Admitting: Emergency Medicine

## 2011-04-28 DIAGNOSIS — I10 Essential (primary) hypertension: Secondary | ICD-10-CM | POA: Insufficient documentation

## 2011-04-28 DIAGNOSIS — R110 Nausea: Secondary | ICD-10-CM | POA: Insufficient documentation

## 2011-04-28 DIAGNOSIS — R209 Unspecified disturbances of skin sensation: Secondary | ICD-10-CM | POA: Insufficient documentation

## 2011-04-28 DIAGNOSIS — E7800 Pure hypercholesterolemia, unspecified: Secondary | ICD-10-CM | POA: Insufficient documentation

## 2011-04-28 DIAGNOSIS — R42 Dizziness and giddiness: Secondary | ICD-10-CM | POA: Insufficient documentation

## 2011-04-28 LAB — CBC
MCV: 90.2 fL (ref 78.0–100.0)
Platelets: 238 10*3/uL (ref 150–400)
RBC: 4.09 MIL/uL (ref 3.87–5.11)
WBC: 8.1 10*3/uL (ref 4.0–10.5)

## 2011-04-28 LAB — TROPONIN I: Troponin I: <0.3 ng/mL (ref <0.30)

## 2011-04-28 LAB — BASIC METABOLIC PANEL
CO2: 25 mEq/L (ref 19–32)
Chloride: 104 mEq/L (ref 96–112)
GFR calc Af Amer: >60 mL/min (ref >60)
Potassium: 4 mEq/L (ref 3.5–5.1)
Sodium: 138 mEq/L (ref 135–145)

## 2011-04-28 LAB — URINALYSIS, ROUTINE W REFLEX MICROSCOPIC
Nitrite: NEGATIVE
Specific Gravity, Urine: 1.016 (ref 1.005–1.030)
Urobilinogen, UA: 0.2 mg/dL (ref 0.0–1.0)
pH: 5.5 (ref 5.0–8.0)

## 2011-04-28 LAB — URINE MICROSCOPIC-ADD ON

## 2011-05-23 ENCOUNTER — Other Ambulatory Visit (HOSPITAL_COMMUNITY): Payer: Self-pay | Admitting: Family Medicine

## 2011-05-23 DIAGNOSIS — Z1231 Encounter for screening mammogram for malignant neoplasm of breast: Secondary | ICD-10-CM

## 2011-05-24 ENCOUNTER — Encounter: Payer: PRIVATE HEALTH INSURANCE | Admitting: Physical Medicine & Rehabilitation

## 2011-05-25 ENCOUNTER — Encounter
Payer: PRIVATE HEALTH INSURANCE | Attending: Physical Medicine & Rehabilitation | Admitting: Physical Medicine & Rehabilitation

## 2011-05-25 DIAGNOSIS — S069XAS Unspecified intracranial injury with loss of consciousness status unknown, sequela: Secondary | ICD-10-CM | POA: Insufficient documentation

## 2011-05-25 DIAGNOSIS — F481 Depersonalization-derealization syndrome: Secondary | ICD-10-CM

## 2011-05-25 DIAGNOSIS — M25519 Pain in unspecified shoulder: Secondary | ICD-10-CM | POA: Insufficient documentation

## 2011-05-25 DIAGNOSIS — M67919 Unspecified disorder of synovium and tendon, unspecified shoulder: Secondary | ICD-10-CM | POA: Insufficient documentation

## 2011-05-25 DIAGNOSIS — M79609 Pain in unspecified limb: Secondary | ICD-10-CM | POA: Insufficient documentation

## 2011-05-25 DIAGNOSIS — M7530 Calcific tendinitis of unspecified shoulder: Secondary | ICD-10-CM

## 2011-05-25 DIAGNOSIS — G931 Anoxic brain damage, not elsewhere classified: Secondary | ICD-10-CM

## 2011-05-25 DIAGNOSIS — F341 Dysthymic disorder: Secondary | ICD-10-CM | POA: Insufficient documentation

## 2011-05-25 DIAGNOSIS — IMO0002 Reserved for concepts with insufficient information to code with codable children: Secondary | ICD-10-CM

## 2011-05-25 DIAGNOSIS — S069X9S Unspecified intracranial injury with loss of consciousness of unspecified duration, sequela: Secondary | ICD-10-CM | POA: Insufficient documentation

## 2011-05-25 DIAGNOSIS — X58XXXS Exposure to other specified factors, sequela: Secondary | ICD-10-CM | POA: Insufficient documentation

## 2011-05-25 DIAGNOSIS — M719 Bursopathy, unspecified: Secondary | ICD-10-CM | POA: Insufficient documentation

## 2011-06-02 ENCOUNTER — Other Ambulatory Visit: Payer: Self-pay | Admitting: Lab

## 2011-06-02 DIAGNOSIS — I8000 Phlebitis and thrombophlebitis of superficial vessels of unspecified lower extremity: Secondary | ICD-10-CM

## 2011-06-07 ENCOUNTER — Ambulatory Visit (HOSPITAL_COMMUNITY): Payer: PRIVATE HEALTH INSURANCE

## 2011-06-09 ENCOUNTER — Ambulatory Visit (HOSPITAL_COMMUNITY)
Admission: RE | Admit: 2011-06-09 | Discharge: 2011-06-09 | Disposition: A | Discharge status: Home / Self Care | Payer: PRIVATE HEALTH INSURANCE | Source: Ambulatory Visit | Attending: Family Medicine | Admitting: Family Medicine

## 2011-06-09 DIAGNOSIS — Z1231 Encounter for screening mammogram for malignant neoplasm of breast: Secondary | ICD-10-CM | POA: Insufficient documentation

## 2011-06-22 ENCOUNTER — Encounter: Payer: Self-pay | Admitting: Vascular Surgery

## 2011-06-23 ENCOUNTER — Ambulatory Visit (INDEPENDENT_AMBULATORY_CARE_PROVIDER_SITE_OTHER): Payer: PRIVATE HEALTH INSURANCE | Admitting: Vascular Surgery

## 2011-06-23 ENCOUNTER — Encounter: Payer: Self-pay | Admitting: Vascular Surgery

## 2011-06-23 ENCOUNTER — Other Ambulatory Visit (INDEPENDENT_AMBULATORY_CARE_PROVIDER_SITE_OTHER): Payer: PRIVATE HEALTH INSURANCE | Admitting: *Deleted

## 2011-06-23 VITALS — BP 151/87 | HR 59 | Resp 20 | Ht 65.0 in | Wt 187.0 lb

## 2011-06-23 DIAGNOSIS — M79609 Pain in unspecified limb: Secondary | ICD-10-CM

## 2011-06-23 DIAGNOSIS — I70219 Atherosclerosis of native arteries of extremities with intermittent claudication, unspecified extremity: Secondary | ICD-10-CM

## 2011-06-23 DIAGNOSIS — I739 Peripheral vascular disease, unspecified: Secondary | ICD-10-CM

## 2011-06-23 MED ORDER — CILOSTAZOL 100 MG PO TABS: 100.0000 mg | ORAL_TABLET | Freq: Two times a day (BID) | ORAL | Status: DC

## 2011-06-23 NOTE — Progress Notes (Signed)
[facility] [place] HISTORY AND PHYSICAL   CC: Referring Physician:  History of Present Illness:  Patient is a 55 y.o. year old female who presents for evaluation of  Past Medical History  Diagnosis Date  . Myocardial infarction   . Diabetes mellitus   . Hypertension   . Leg pain   . Anoxic brain injury   . Rotator cuff tendonitis     Past Surgical History  Procedure Date  . Cholecystectomy     Gall Bladder  . Ovarian cyst removal   . Lumbar spine surgery   . Stomach and skin tuck 2000  . Coronary artery bypass graft     ROS: [x]  Positive   [ ]  Negative   [ ]  All sytems reviewed and are negative  General:[ ]  Weight loss, [ ]  Fever, [ ]  chills Neurologic: [ ]  Dizziness, [ ]  Blackouts, [ ]  Seizure [ ]  Stroke, [ ]  "Mini stroke", [ ]  Slurred speech, [ ]  Temporary blindness;  [ ] weakness,  [ ]  Hoarseness Cardiac: [ ]  Chest pain/pressure, [ ]  Shortness of breath at rest [ ]  Shortness of breath with exertion,  [ ]   Atrial fibrillation or irregular heartbeat Vascular:[ ]  Pain in legs with walking, [ ]  Pain in legs at rest ,[ ]  Pain in legs at night,  [ ]   Non-healing ulcer, [ ]  Blood clot in vein/DVT,   Pulmonary: [ ]  Home oxygen, [ ]   Productive cough, [ ]  Coughing up blood,  [ ]  Asthma,  [ ]  Wheezing Musculoskeletal:  [ ]  Arthritis, [ ]  Low back pain,  [ ]  Joint pain Hematologic:[ ]  Easy Bruising, [ ]  Anemia; [ ]  Hepatitis Gastrointestinal: [ ]  Blood in stool,  [ ]  Gastroesophageal Reflux, [ ]  Trouble swallowing Urinary: [ ]  chronic Kidney disease, [ ]  on HD - [ ]  MWF or [ ]  TTHS, [ ]  Burning with urination, [ ]  Frequent urination, [ ]  Difficulty urinating;  Skin: [ ]  Rashes, [ ]  Wounds Psychological: [ ]  Anxiety,  [ ]  Depression  Social History History  Substance Use Topics  . Smoking status: Former Smoker -- 20 years    Types: Cigarettes    Quit date: 06/22/2005  . Smokeless tobacco: Never Used  . Alcohol Use: No    Family History Family  History  Problem Relation Age of Onset  . Heart disease Other   . Diabetes Other   . Hypertension Other   . Alcohol abuse Other   . Mental illness Other   . Heart disease Mother   . Heart disease Father   . Heart disease Sister     Allergies  Allergies  Allergen Reactions  . Halothane      Current Outpatient Prescriptions  Medication Sig Dispense Refill  . aspirin EC 325 MG tablet Take 325 mg by mouth daily. TAKE 1/2 DAILY      . atorvastatin (LIPITOR) 20 MG tablet Take 40 mg by mouth daily.       [name] ibuprofen (ADVIL,MOTRIN) 200 MG tablet Take 200 mg by mouth every 8 (eight) hours as needed.        [name] LORazepam (ATIVAN) 1 MG tablet Take 1 mg by mouth every 8 (eight) hours. TAKE 1 1/2 NIGHTLY       . losartan (COZAAR) 50 MG tablet Take 50 mg by mouth daily.        . metoprolol succinate (TOPROL-XL) 25 MG 24 hr tablet Take 25 mg by mouth 2 (two) times  daily. TAKE 1 1/2 TWICE DAILY      . pantoprazole (PROTONIX) 40 MG tablet Take 40 mg by mouth daily.        . sertraline (ZOLOFT) 100 MG tablet Take 100 mg by mouth daily. TAKE 1 1/2 DAILY      . amiodarone (PACERONE) 200 MG tablet Take 200 mg by mouth daily.          Physical Examination  Filed Vitals:   06/23/11 1240  BP: 151/87  Pulse: 59  Resp: 20    Body mass index is 31.12 kg/(m^2).  General:  Alert and oriented, no acute distress HEENT: Normal Neck: No bruit or JVD Pulmonary: Clear to auscultation bilaterally Cardiac: Regular Rate and Rhythm without murmur Gastrointestinal: Soft, non-tender, non-distended, no mass, upper midline scar Skin: No rash Extremity Pulses:  2+ radial, brachial, femoral pulses, absent popliteal dorsalis pedis, posterior tibial pulses bilaterally Musculoskeletal: No deformity or edema  Neurologic: Upper and lower extremity motor 5/5 and symmetric  DATA:  Venous duplex ultrasound today shows deep venous incompetence with a competent superficial system not really consistent with  superficial thrombophlebitis  Bilateral ABIs Right side 0.49, left 0.84, monophasic waveforms bilaterally   ASSESSMENT: Bilateral lower extremity claudication   PLAN: #1 Will start the patient on Pletal and a walking program of 30 minutes daily today. She is already on antiplatelet therapy in the form of aspirin  #2 she return for followup ABIs in 6 months time or sooner if her symptoms become worse

## 2011-06-23 NOTE — Progress Notes (Signed)
Addended by: Sharee Pimple on: 06/23/2011 03:21 PM   Modules accepted: Orders

## 2011-06-30 ENCOUNTER — Telehealth: Payer: Self-pay

## 2011-06-30 NOTE — Telephone Encounter (Signed)
Pt. called to make Dr. Darrick Penna aware that she has been having "heart palpitations" since starting Cilostazol.  Questions what she should do?  Discussed w Dr. Darrick Penna, and recommends that pt. stop the medication.  Pt. made aware, and verb. understanding.

## 2011-07-01 NOTE — Procedures (Unsigned)
DUPLEX DEEP VENOUS EXAM - LOWER EXTREMITY  INDICATION:  Bilateral lower extremity pain.  HISTORY:  Edema:  No. Trauma/Surgery:  Left GSV used for quadruple bypass. Pain:  Yes. PE:  No. Previous DVT:  No. Anticoagulants:  Aspirin. Other:  DUPLEX EXAM:               CFV   SFV   PopV  PTV    GSV               R  L  R  L  R  L  R   L  R  L Thrombosis    o  o  o  o  o  o  o   o  o  NV Spontaneous   +  +  +  +  +  +  +   +  + Phasic        +  +  +  +  +  +  +   +  + Augmentation  +  +  +  +  +  +  +   +  + Compressible  +  +  +  +  +  +  +   +  + Competent     0  0  0  0  +  +  +   +  +  Legend:  + - yes  o - no  p - partial  D - decreased  IMPRESSION:  No evidence of deep venous thrombosis or superficial thrombophlebitis.  There is evidence of deep vein insufficiency.   _____________________________ Janetta Hora Darrick Penna, MD  EM/MEDQ  D:  06/23/2011  T:  06/23/2011  Job:  [id number]

## 2011-07-28 NOTE — Assessment & Plan Note (Signed)
Alicia Crawford is back regarding her anoxic brain injury and pain issues.  Left shoulder has been steadily improving as well as the right shoulder.  She has been swimming and really has maximized her range of motion.  She still has some problems with leg pain particularly in the lateral lower leg and calf.  It has not been improving but she states that generally, she has just become more used to it.  She denies any warmth or swelling in either leg.  She states that the pain seems often fairly superficial as she even feels that it may be in the veins.  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  This has been working out well for her.  REVIEW OF SYSTEMS:  Notable for the above.  Full 12-point review is in the written health and history section of the chart.  SOCIAL HISTORY:  Unchanged.  PHYSICAL EXAMINATION:  VITAL SIGNS:  Blood pressure is 122/67, pulse 60, respiratory 16, she is satting 97% on room air.  The patient is pleasant, alert.  Memory and attention are gradually improving.  She is very appropriate today.  She walks with no significant antalgia.  She seems to have compensated for the pain.  She does have prominence of her superficial veins of either calf and lateral leg regions.  They are slightly firm to touch.  There is no swelling or redness or warmth that I can see.  She has intact strength.  Calf movement and knee movement does not seem to exacerbate pain.  She is wearing appropriate shoe wear today.  ASSESSMENT: 1. Anoxic brain injury 2. Right rotator cuff tendonitis/bursitis. 3. Questionable superficial thrombophlebitis.  PLAN: 1. At this point, I think it might be best to send her to a vein     specialist to assess her legs as it may be in fact her veins     causing some of this pain.  I did give her prescription of Voltaren     gel and recommended she could try some heat as well as stockings     for legs to. 2. Continue with her exercise to  tolerance.  Hopefully as her legs are     feeling bit better, she can push herself a bit more.  I did tell     her to check with her cardiologist regarding her cardiac     parameters/ limitations. 3. I will see her back in 6 months' time.  Overall, she has done very     nicely.     Ranelle Oyster, M.D. Electronically Signed    ZTS/MedQ D:  05/25/2011 12:20:43  T:  05/25/2011 19:53:07  Job #:  [id number]

## 2011-11-21 ENCOUNTER — Encounter: Payer: PRIVATE HEALTH INSURANCE | Admitting: Physical Medicine & Rehabilitation

## 2011-12-02 ENCOUNTER — Other Ambulatory Visit (HOSPITAL_COMMUNITY)
Admission: RE | Admit: 2011-12-02 | Discharge: 2011-12-02 | Disposition: A | Discharge status: Home / Self Care | Payer: PRIVATE HEALTH INSURANCE | Source: Ambulatory Visit | Attending: Family Medicine | Admitting: Family Medicine

## 2011-12-02 DIAGNOSIS — Z124 Encounter for screening for malignant neoplasm of cervix: Secondary | ICD-10-CM | POA: Insufficient documentation

## 2011-12-15 ENCOUNTER — Telehealth: Payer: Self-pay | Admitting: Vascular Surgery

## 2011-12-15 NOTE — Telephone Encounter (Signed)
Patient called on 12/15/11 to cancel her appt and lab with Dr. Darrick Penna on 12/22/11. She did not wish to reschedule and stated that she is "going to have legs followed by Dr. Gery Pray"

## 2011-12-22 ENCOUNTER — Ambulatory Visit: Payer: PRIVATE HEALTH INSURANCE | Admitting: Vascular Surgery

## 2012-01-02 HISTORY — PX: LOWER EXTREMITY ANGIOGRAM: SHX5955

## 2012-01-03 ENCOUNTER — Encounter (HOSPITAL_COMMUNITY): Payer: Self-pay | Admitting: Pharmacy Technician

## 2012-01-04 ENCOUNTER — Other Ambulatory Visit: Payer: Self-pay | Admitting: Cardiovascular Disease

## 2012-01-11 ENCOUNTER — Other Ambulatory Visit: Payer: Self-pay | Admitting: Cardiovascular Disease

## 2012-01-11 ENCOUNTER — Ambulatory Visit
Admission: RE | Admit: 2012-01-11 | Discharge: 2012-01-11 | Disposition: A | Discharge status: Home / Self Care | Payer: PRIVATE HEALTH INSURANCE | Source: Ambulatory Visit | Attending: Cardiovascular Disease | Admitting: Cardiovascular Disease

## 2012-01-11 DIAGNOSIS — I2510 Atherosclerotic heart disease of native coronary artery without angina pectoris: Secondary | ICD-10-CM

## 2012-01-11 DIAGNOSIS — I270 Primary pulmonary hypertension: Secondary | ICD-10-CM

## 2012-01-17 ENCOUNTER — Encounter (HOSPITAL_COMMUNITY): Payer: Self-pay | Admitting: General Practice

## 2012-01-17 ENCOUNTER — Ambulatory Visit (HOSPITAL_COMMUNITY)
Admission: RE | Admit: 2012-01-17 | Discharge: 2012-01-18 | Disposition: A | Discharge status: Home / Self Care | Payer: PRIVATE HEALTH INSURANCE | Source: Ambulatory Visit | Attending: Cardiovascular Disease | Admitting: Cardiovascular Disease

## 2012-01-17 ENCOUNTER — Encounter (HOSPITAL_COMMUNITY)
Admission: RE | Disposition: A | Discharge status: Home / Self Care | Payer: Self-pay | Source: Ambulatory Visit | Attending: Cardiovascular Disease

## 2012-01-17 DIAGNOSIS — I701 Atherosclerosis of renal artery: Secondary | ICD-10-CM | POA: Diagnosis present

## 2012-01-17 DIAGNOSIS — I70219 Atherosclerosis of native arteries of extremities with intermittent claudication, unspecified extremity: Secondary | ICD-10-CM | POA: Insufficient documentation

## 2012-01-17 DIAGNOSIS — Z951 Presence of aortocoronary bypass graft: Secondary | ICD-10-CM | POA: Insufficient documentation

## 2012-01-17 DIAGNOSIS — I739 Peripheral vascular disease, unspecified: Secondary | ICD-10-CM | POA: Diagnosis present

## 2012-01-17 DIAGNOSIS — I10 Essential (primary) hypertension: Secondary | ICD-10-CM

## 2012-01-17 DIAGNOSIS — E785 Hyperlipidemia, unspecified: Secondary | ICD-10-CM | POA: Diagnosis present

## 2012-01-17 DIAGNOSIS — I150 Renovascular hypertension: Secondary | ICD-10-CM | POA: Insufficient documentation

## 2012-01-17 HISTORY — DX: Depression, unspecified: F32.A

## 2012-01-17 HISTORY — DX: Encounter for other specified aftercare: Z51.89

## 2012-01-17 HISTORY — DX: Type 2 diabetes mellitus without complications: E11.9

## 2012-01-17 HISTORY — PX: ATHERECTOMY: SHX5502

## 2012-01-17 HISTORY — DX: Pure hypercholesterolemia, unspecified: E78.00

## 2012-01-17 HISTORY — DX: Major depressive disorder, single episode, unspecified: F32.9

## 2012-01-17 HISTORY — DX: Other complications of anesthesia, initial encounter: T88.59XA

## 2012-01-17 HISTORY — DX: Adverse effect of unspecified anesthetic, initial encounter: T41.45XA

## 2012-01-17 HISTORY — DX: Cardiac arrest, cause unspecified: I46.9

## 2012-01-17 LAB — GLUCOSE, CAPILLARY
Glucose-Capillary: 145 mg/dL — ABNORMAL HIGH (ref 70–99)
Glucose-Capillary: 97 mg/dL (ref 70–99)

## 2012-01-17 SURGERY — ATHERECTOMY
Anesthesia: LOCAL

## 2012-01-17 MED ORDER — HEPARIN (PORCINE) IN NACL 2-0.9 UNIT/ML-% IJ SOLN
INTRAMUSCULAR | Status: AC
  Filled 2012-01-17: qty 1000

## 2012-01-17 MED ORDER — SODIUM CHLORIDE 0.9 % IJ SOLN: 3.0000 mL | INTRAMUSCULAR | Status: DC | PRN

## 2012-01-17 MED ORDER — MORPHINE SULFATE 4 MG/ML IJ SOLN: 1.0000 mg | INTRAMUSCULAR | Status: DC | PRN

## 2012-01-17 MED ORDER — ACETAMINOPHEN 325 MG PO TABS
650.0000 mg | ORAL_TABLET | ORAL | Status: DC | PRN
  Administered 2012-01-18: 650 mg via ORAL
  Filled 2012-01-17: qty 2

## 2012-01-17 MED ORDER — DIAZEPAM 5 MG PO TABS
5.0000 mg | ORAL_TABLET | ORAL | Status: AC
  Administered 2012-01-17: 5 mg via ORAL
  Filled 2012-01-17: qty 1

## 2012-01-17 MED ORDER — ONDANSETRON HCL 4 MG/2ML IJ SOLN: 4.0000 mg | Freq: Four times a day (QID) | INTRAMUSCULAR | Status: DC | PRN

## 2012-01-17 MED ORDER — LIDOCAINE HCL (PF) 1 % IJ SOLN
INTRAMUSCULAR | Status: AC
  Filled 2012-01-17: qty 30

## 2012-01-17 MED ORDER — SODIUM CHLORIDE 0.9 % IV SOLN: INTRAVENOUS | Status: AC

## 2012-01-17 MED ORDER — SODIUM CHLORIDE 0.9 % IV SOLN
INTRAVENOUS | Status: DC
  Administered 2012-01-17: 12:00:00 via INTRAVENOUS

## 2012-01-17 NOTE — H&P (Signed)
  H & P will be scanned in.  Pt was reexamined and existing H & P reviewed. No changes found.  [name], MD [facility] 01/17/2012 2:56 PM

## 2012-01-17 NOTE — Progress Notes (Signed)
REPORT CALLED TO [facility],RN FOR [id number] AND TRANSFERRED VIA BED

## 2012-01-17 NOTE — Op Note (Signed)
Alicia Crawford is a 56 y.o. female    [id number] LOCATION:  FACILITY: [facility]  PHYSICIAN: Nanetta Batty, M.D. [birth year]   DATE OF PROCEDURE:  01/17/2012  DATE OF DISCHARGE:  [facility]  PV Angiogram    History obtained from chart review. [name] is a 56 year old moderately overweight married Caucasian female mother of 3 history remote tobacco abuse having quit 10 years ago, hypertension, hyperlipidemia and family history of heart disease. She had an episode of witnessed cardiac arrest, CPR, defibrillation and ultimately coronary artery bypass grafting in [facility] 09/27/2010 after being catheterized. She spent 10 days in the ICU ventilator in 10 days in great rehabilitation. I initially met her January 2012 at that time she was expressing tremendous anxiety. This is typically improve. EF by 2-D echo in April of this year was normal MIP was normal as well. She denies chest pain or shortness of breath she does complain of lifestyle limiting claudication. Dopplers the office 12/06/2011 revealed a right ABI of 0.46 with an occluded right SFA and high-grade distal right common femoral artery stenosis, a left ABI 0.62 with an occluded left SFA. She presents now for angiography and potential percutaneous intervention for lifestyle limiting claudication.   PROCEDURE DESCRIPTION:    The patient was brought to the second floor  [facility] lab in the postabsorptive state. She was  premedicated with Valium 5 mg by mouth. Her left groin was prepped and shaved in usual sterile fashion. Xylocaine 1% was used  for local anesthesia. A 5 French sheath was inserted into the left common femoral  artery using standard Seldinger technique.a 5 French pigtail catheter along with a 5 [place] crossover and no catheters were used for abdominal aortography with bifemoral runoff using bolus chase digital subtraction step table technique. Visipaque dye was used for the  entirety of the case. Retrograde aortic pressures monitored in the case.   HEMODYNAMICS:    AO SYSTOLIC/AO DIASTOLIC: 158/66   ANGIOGRAPHIC RESULTS:   1: Abdominal aortogram -75% proximal right renal artery stenosis. The infrarenal abdominal aorta was free of significant gross sclerotic changes  2: Left lower extremity-60% distal left common femoral artery stenosis. Left SFA was occluded with reconstitution in Hunter's canal. There was one vessel runoff via the peroneal artery.  3. Right lower extremity: There was a 99% calcified lesion at the trifurcation of the distal common femoral, profunda and subtotally occluded SFA. The SFA was occluded segmentally reconstituted in Hunter's canal. There is three-vessel runoff   IMPRESSION:[name] has severe PAD probably not best treated percutaneously. I reviewed the angiogram with Dr. Durene Cal who thought that the best option is either endarterectomy with patch angioplasty of the right distal common femoral profunda versus femoropopliteal bypass grafting. The sheath was removed and pressure was held on the groin to achieve hemostasis. The patient left the lab in stable condition. She'll be discharged home after remaining recumbent for 4 hours and will see back in the office in one week for followup.  [name] MD, [facility] 01/17/2012 3:53 PM

## 2012-01-17 NOTE — Discharge Instructions (Signed)

## 2012-01-17 NOTE — Progress Notes (Signed)
PRESSURE DRESSING REMOVED FROM LEFT GROIN AND CLIENT C/O GROIN BEING TENDER;LEFT GROIN BRUISED;NO BRUIT; RIDGE OF FIRMNESS FELT THAT IS NOT FELT ON RIGHT GROIN; DR BERRY NOTIFIED AND ORDER NOTED

## 2012-01-18 ENCOUNTER — Other Ambulatory Visit: Payer: Self-pay | Admitting: *Deleted

## 2012-01-18 DIAGNOSIS — I739 Peripheral vascular disease, unspecified: Secondary | ICD-10-CM

## 2012-01-18 DIAGNOSIS — Z01810 Encounter for preprocedural cardiovascular examination: Secondary | ICD-10-CM

## 2012-01-18 DIAGNOSIS — I7092 Chronic total occlusion of artery of the extremities: Secondary | ICD-10-CM

## 2012-01-18 DIAGNOSIS — I10 Essential (primary) hypertension: Secondary | ICD-10-CM

## 2012-01-18 DIAGNOSIS — I701 Atherosclerosis of renal artery: Secondary | ICD-10-CM | POA: Diagnosis present

## 2012-01-18 DIAGNOSIS — E785 Hyperlipidemia, unspecified: Secondary | ICD-10-CM | POA: Diagnosis present

## 2012-01-18 LAB — GLUCOSE, CAPILLARY: Glucose-Capillary: 142 mg/dL — ABNORMAL HIGH (ref 70–99)

## 2012-01-18 NOTE — Discharge Summary (Signed)
Alicia Crawford C. Zella Dewan, MD, FACC Attending Cardiologist [facility]  

## 2012-01-18 NOTE — Discharge Summary (Signed)
Physician Discharge Summary  Patient ID: Alicia Crawford MRN: [id number] DOB/AGE: 58-Aug-1957 58 y.o.  Admit date: 01/17/2012 Discharge date: 01/18/2012  Admission Diagnoses:  PAD  Discharge Diagnoses:  Principal Problem:  *Peripheral artery disease Active Problems:  HLD (hyperlipidemia)  HTN (hypertension)  Renal artery stenosis, Right   Discharged Condition: stable  Hospital Course:   Alicia Crawford is a 56 year old moderately overweight married Caucasian female mother of 3 history remote tobacco abuse having quit 10 years ago, hypertension, hyperlipidemia and family history of heart disease. She had an episode of witnessed cardiac arrest, CPR, defibrillation and ultimately coronary artery bypass grafting in [facility] 09/27/2010 after being catheterized. She spent 10 days in the ICU ventilator in 10 days in great rehabilitation. I initially met her January 2012 at that time she was expressing tremendous anxiety. This is typically improve. EF by 2-D echo in April of this year was normal MIP was normal as well. She denied chest pain or shortness of breath she does complain of lifestyle limiting claudication. Dopplers the office 12/06/2011 revealed a right ABI of 0.46 with an occluded right SFA and high-grade distal right common femoral artery stenosis, a left ABI 0.62 with an occluded left SFA. She presented for angiography and potential percutaneous intervention for lifestyle limiting claudication.    PV angio results are listed below.  Angiogram results were reviewed with Dr. Myra Gianotti and options include either endarterectomy with patch angioplasty of the right distal common femoral profunda versus femoropopliteal bypass grafting.  The patient was discharged in stable condition after being seen by Dr. Rennis Golden.  Follow-up one one week with Dr. Allyson Sabal.  Consults: Vascular Surgery   Significant Diagnostic Studies:  PV Angio/abd aortography  HEMODYNAMICS:  AO SYSTOLIC/AO DIASTOLIC: 158/66    ANGIOGRAPHIC RESULTS:  1: Abdominal aortogram -75% proximal right renal artery stenosis. The infrarenal abdominal aorta was free of significant gross sclerotic changes  2: Left lower extremity-60% distal left common femoral artery stenosis. Left SFA was occluded with reconstitution in Hunter's canal. There was one vessel runoff via the peroneal artery.  3. Right lower extremity: There was a 99% calcified lesion at the trifurcation of the distal common femoral, profunda and subtotally occluded SFA. The SFA was occluded segmentally reconstituted in Hunter's canal. There is three-vessel runoff  IMPRESSION:Ms. Alicia Crawford has severe PAD probably not best treated percutaneously. I reviewed the angiogram with Dr. Durene Cal who thought that the best option is either endarterectomy with patch angioplasty of the right distal common femoral profunda versus femoropopliteal bypass grafting. The sheath was removed and pressure was held on the groin to achieve hemostasis. The patient left the lab in stable condition.   Treatments:    Discharge Exam: Blood pressure 140/83, pulse 78, temperature 97 F (36.1 C), temperature source Oral, resp. rate 16, height 5' 5.5" (1.664 m), weight 89.359 kg (197 lb), SpO2 97.00%.   Disposition: 01-Home or Self Care  Discharge Orders    Future Orders Please Complete By Expires   Diet - low sodium heart healthy      Increase activity slowly      Discharge instructions      Comments:   No driving or lifting greater than 5 pounds for the next three days.  Ok to resume walking tomorrow.     Medication List  As of 01/18/2012 11:10 AM   TAKE these medications         ACETYLCARNITINE HCL PO   Take 1 capsule by mouth daily.  aspirin EC 325 MG tablet   Take 162.5 mg by mouth daily. TAKE 1/2 DAILY      atorvastatin 80 MG tablet   Commonly known as: LIPITOR   Take 80 mg by mouth daily.      EQL COQ10 300 MG Caps   Generic drug: Coenzyme Q10   Take 1 capsule by mouth  daily.      IBUPROFEN PM PO   Take 2 tablets by mouth at bedtime.      LECITHIN PO   Take 1,200 mg by mouth daily.      LORazepam 1 MG tablet   Commonly known as: ATIVAN   Take 1 mg by mouth at bedtime.      losartan 50 MG tablet   Commonly known as: COZAAR   Take 50 mg by mouth daily.      metFORMIN 500 MG tablet   Commonly known as: GLUCOPHAGE   Take 500 mg by mouth daily.      metoprolol succinate 25 MG 24 hr tablet   Commonly known as: TOPROL-XL   Take 37.5 mg by mouth 2 (two) times daily.      mulitivitamin with minerals Tabs   Take 1 tablet by mouth daily.      OVER THE COUNTER MEDICATION   Take 1 capsule by mouth daily. Mega Red      pantoprazole 40 MG tablet   Commonly known as: PROTONIX   Take 40 mg by mouth daily.      polycarbophil 625 MG tablet   Commonly known as: FIBERCON   Take 625 mg by mouth daily.      sertraline 50 MG tablet   Commonly known as: ZOLOFT   Take 150 mg by mouth daily.           Follow-up Information    Follow up with [name], MD. (Our office will call you with the appt. date and time.)    Contact information:   [address] [place] [address] [phone]          Signed: Dwana Melena 01/18/2012, 11:10 AM

## 2012-01-18 NOTE — Progress Notes (Signed)
Pt. Seen and examined. Agree with the NP/PA-C note as written.  Ok for d/c home today and follow-up with Dr. Allyson Sabal. Labs stable and groin stable.  Chrystie Nose, MD, [facility] Attending Cardiologist [facility]

## 2012-01-18 NOTE — Progress Notes (Signed)
[facility]  Subjective: No Complaints.  Objective: Vital signs in last 24 hours: Temp:  [97 F (36.1 C)-98.1 F (36.7 C)] 97 F (36.1 C) (04/17 0800) Pulse Rate:  [62-90] 78  (04/17 0800) Resp:  [16-20] 16  (04/17 0800) BP: (111-154)/(57-83) 140/83 mmHg (04/17 0800) SpO2:  [96 %-98 %] 97 % (04/17 0800) Weight:  [89.359 kg (197 lb)-89.812 kg (198 lb)] 89.359 kg (197 lb) (04/17 0630) Last BM Date: 01/17/12  Intake/Output from previous day: 04/16 0701 - 04/17 0700 In: 300 [P.O.:300] Out: 825 [Urine:825] Intake/Output this shift: Total I/O In: -  Out: 100 [Urine:100]  Medications Current Facility-Administered Medications  Medication Dose Route Frequency Provider Last Rate Last Dose  . 0.9 %  sodium chloride infusion   Intravenous Continuous [name], MD      . acetaminophen (TYLENOL) tablet 650 mg  650 mg Oral Q4H PRN [name], MD   650 mg at 01/18/12 0138  . diazepam (VALIUM) tablet 5 mg  5 mg Oral On Call [name], MD   5 mg at 01/17/12 1143  . heparin 2-0.9 UNIT/ML-% infusion           . lidocaine (XYLOCAINE) 1 % injection           . morphine 4 MG/ML injection 1 mg  1 mg Intravenous Q1H PRN [name], MD      . ondansetron [facility]) injection 4 mg  4 mg Intravenous Q6H PRN [name], MD      . DISCONTD: 0.9 %  sodium chloride infusion   Intravenous Continuous [name], MD 75 mL/hr at 01/17/12 1144    . DISCONTD: sodium chloride 0.9 % injection 3 mL  3 mL Intravenous PRN [name], MD        PE: General appearance: alert, cooperative and no distress Lungs: clear to auscultation bilaterally Heart: regular rate and rhythm, S1, S2 normal, no murmur, click, rub or gallop Extremities: No LEE  Pulses: 1+ right radials, 2+ left radial.  Lower extremities warm. Left Groin:  Mild ecchymosis and tenderness.  No bruit or hematoma.  Studies/Results: PV angiogram/Abdominal  aortogram  HEMODYNAMICS:  AO SYSTOLIC/AO DIASTOLIC: 158/66  ANGIOGRAPHIC RESULTS:  1: Abdominal aortogram -75% proximal right renal artery stenosis. The infrarenal abdominal aorta was free of significant gross sclerotic changes  2: Left lower extremity-60% distal left common femoral artery stenosis. Left SFA was occluded with reconstitution in Hunter's canal. There was one vessel runoff via the peroneal artery.  3. Right lower extremity: There was a 99% calcified lesion at the trifurcation of the distal common femoral, profunda and subtotally occluded SFA. The SFA was occluded segmentally reconstituted in Hunter's canal. There is three-vessel runoff  IMPRESSION:Ms. Kiger has severe PAD probably not best treated percutaneously. I reviewed the angiogram with Dr. Durene Cal who thought that the best option is either endarterectomy with patch angioplasty of the right distal common femoral profunda versus femoropopliteal bypass grafting. The sheath was removed and pressure was held on the groin to achieve hemostasis. The patient left the lab in stable condition. She'll be discharged home after remaining recumbent for 4 hours and will see back in the office in one week for followup.   Assessment/Plan   Active Problems: 1. PAD  2. HTN 3. HLD 4. CAD 5. CABG 2011  Plan: S/P PV angio, Abdominal aortogram.   Ms. Firman has severe PAD probably not best treated percutaneously. Dr. Durene Cal  thought that the best option is either endarterectomy with patch angioplasty of the right distal common femoral profunda versus femoropopliteal bypass grafting. Also, 75% right RAS.  DC home today with Follow up with Dr. Allyson Sabal in one week.   LOS: 1 day    Tiwanna Tuch W 01/18/2012 9:36 AM

## 2012-02-06 ENCOUNTER — Encounter: Payer: PRIVATE HEALTH INSURANCE | Admitting: Surgery

## 2012-02-08 NOTE — Progress Notes (Signed)
02/08/2012 Malyia Moro SPARKS Case Management Note [phone]  Utilization review completed.  

## 2012-02-25 ENCOUNTER — Emergency Department (HOSPITAL_COMMUNITY): Payer: PRIVATE HEALTH INSURANCE

## 2012-02-25 ENCOUNTER — Encounter (HOSPITAL_COMMUNITY): Payer: Self-pay | Admitting: Anesthesiology

## 2012-02-25 ENCOUNTER — Encounter (HOSPITAL_COMMUNITY): Payer: Self-pay | Admitting: *Deleted

## 2012-02-25 ENCOUNTER — Encounter (HOSPITAL_COMMUNITY): Payer: Self-pay | Admitting: Neurological Surgery

## 2012-02-25 ENCOUNTER — Encounter (HOSPITAL_COMMUNITY)
Admission: EM | Disposition: A | Discharge status: Rehabilitation Facility | Payer: Self-pay | Source: Home / Self Care | Attending: Neurological Surgery

## 2012-02-25 ENCOUNTER — Inpatient Hospital Stay (HOSPITAL_COMMUNITY)
Admission: EM | Admit: 2012-02-25 | Discharge: 2012-03-01 | DRG: 026 | Disposition: A | Discharge status: Rehabilitation Facility | Payer: PRIVATE HEALTH INSURANCE | Attending: Neurological Surgery | Admitting: Neurological Surgery

## 2012-02-25 ENCOUNTER — Inpatient Hospital Stay (HOSPITAL_COMMUNITY): Payer: PRIVATE HEALTH INSURANCE | Admitting: Anesthesiology

## 2012-02-25 DIAGNOSIS — E7800 Pure hypercholesterolemia, unspecified: Secondary | ICD-10-CM | POA: Diagnosis present

## 2012-02-25 DIAGNOSIS — I701 Atherosclerosis of renal artery: Secondary | ICD-10-CM | POA: Diagnosis present

## 2012-02-25 DIAGNOSIS — Z8674 Personal history of sudden cardiac arrest: Secondary | ICD-10-CM

## 2012-02-25 DIAGNOSIS — Z23 Encounter for immunization: Secondary | ICD-10-CM

## 2012-02-25 DIAGNOSIS — R1110 Vomiting, unspecified: Secondary | ICD-10-CM

## 2012-02-25 DIAGNOSIS — T380X5A Adverse effect of glucocorticoids and synthetic analogues, initial encounter: Secondary | ICD-10-CM | POA: Diagnosis not present

## 2012-02-25 DIAGNOSIS — S065XAA Traumatic subdural hemorrhage with loss of consciousness status unknown, initial encounter: Secondary | ICD-10-CM

## 2012-02-25 DIAGNOSIS — R404 Transient alteration of awareness: Secondary | ICD-10-CM | POA: Diagnosis present

## 2012-02-25 DIAGNOSIS — Z79899 Other long term (current) drug therapy: Secondary | ICD-10-CM

## 2012-02-25 DIAGNOSIS — E785 Hyperlipidemia, unspecified: Secondary | ICD-10-CM | POA: Diagnosis present

## 2012-02-25 DIAGNOSIS — I739 Peripheral vascular disease, unspecified: Secondary | ICD-10-CM | POA: Diagnosis present

## 2012-02-25 DIAGNOSIS — F411 Generalized anxiety disorder: Secondary | ICD-10-CM | POA: Diagnosis present

## 2012-02-25 DIAGNOSIS — R001 Bradycardia, unspecified: Secondary | ICD-10-CM | POA: Diagnosis present

## 2012-02-25 DIAGNOSIS — I2510 Atherosclerotic heart disease of native coronary artery without angina pectoris: Secondary | ICD-10-CM | POA: Diagnosis present

## 2012-02-25 DIAGNOSIS — W19XXXA Unspecified fall, initial encounter: Secondary | ICD-10-CM | POA: Diagnosis present

## 2012-02-25 DIAGNOSIS — Z7982 Long term (current) use of aspirin: Secondary | ICD-10-CM

## 2012-02-25 DIAGNOSIS — S065X9A Traumatic subdural hemorrhage with loss of consciousness of unspecified duration, initial encounter: Secondary | ICD-10-CM

## 2012-02-25 DIAGNOSIS — Z981 Arthrodesis status: Secondary | ICD-10-CM

## 2012-02-25 DIAGNOSIS — F0390 Unspecified dementia without behavioral disturbance: Secondary | ICD-10-CM | POA: Diagnosis present

## 2012-02-25 DIAGNOSIS — R4701 Aphasia: Secondary | ICD-10-CM | POA: Diagnosis present

## 2012-02-25 DIAGNOSIS — E119 Type 2 diabetes mellitus without complications: Secondary | ICD-10-CM | POA: Diagnosis present

## 2012-02-25 DIAGNOSIS — E139 Other specified diabetes mellitus without complications: Secondary | ICD-10-CM | POA: Diagnosis not present

## 2012-02-25 DIAGNOSIS — F3289 Other specified depressive episodes: Secondary | ICD-10-CM | POA: Diagnosis present

## 2012-02-25 DIAGNOSIS — F419 Anxiety disorder, unspecified: Secondary | ICD-10-CM | POA: Diagnosis present

## 2012-02-25 DIAGNOSIS — I252 Old myocardial infarction: Secondary | ICD-10-CM

## 2012-02-25 DIAGNOSIS — Z87891 Personal history of nicotine dependence: Secondary | ICD-10-CM

## 2012-02-25 DIAGNOSIS — Z951 Presence of aortocoronary bypass graft: Secondary | ICD-10-CM

## 2012-02-25 DIAGNOSIS — E669 Obesity, unspecified: Secondary | ICD-10-CM | POA: Diagnosis present

## 2012-02-25 DIAGNOSIS — Z9889 Other specified postprocedural states: Secondary | ICD-10-CM

## 2012-02-25 DIAGNOSIS — S065X0A Traumatic subdural hemorrhage without loss of consciousness, initial encounter: Principal | ICD-10-CM | POA: Diagnosis present

## 2012-02-25 DIAGNOSIS — F329 Major depressive disorder, single episode, unspecified: Secondary | ICD-10-CM | POA: Diagnosis present

## 2012-02-25 DIAGNOSIS — Z6831 Body mass index (BMI) 31.0-31.9, adult: Secondary | ICD-10-CM

## 2012-02-25 DIAGNOSIS — R4182 Altered mental status, unspecified: Secondary | ICD-10-CM

## 2012-02-25 DIAGNOSIS — R51 Headache: Secondary | ICD-10-CM

## 2012-02-25 DIAGNOSIS — I10 Essential (primary) hypertension: Secondary | ICD-10-CM | POA: Diagnosis present

## 2012-02-25 HISTORY — DX: Atherosclerotic heart disease of native coronary artery without angina pectoris: I25.10

## 2012-02-25 HISTORY — PX: CRANIOTOMY: SHX93

## 2012-02-25 HISTORY — DX: Traumatic subdural hemorrhage with loss of consciousness status unknown, initial encounter: S06.5XAA

## 2012-02-25 LAB — CBC
HCT: 34.6 % — ABNORMAL LOW (ref 36.0–46.0)
Hemoglobin: 11.7 g/dL — ABNORMAL LOW (ref 12.0–15.0)
MCH: 30.9 pg (ref 26.0–34.0)
MCHC: 33.8 g/dL (ref 30.0–36.0)
MCV: 91.3 fL (ref 78.0–100.0)
Platelets: 234 10*3/uL (ref 150–400)
RBC: 3.79 MIL/uL — ABNORMAL LOW (ref 3.87–5.11)
RDW: 13.7 % (ref 11.5–15.5)
WBC: 9.7 10*3/uL (ref 4.0–10.5)

## 2012-02-25 LAB — GLUCOSE, CAPILLARY
Glucose-Capillary: 137 mg/dL — ABNORMAL HIGH (ref 70–99)
Glucose-Capillary: 192 mg/dL — ABNORMAL HIGH (ref 70–99)

## 2012-02-25 LAB — BASIC METABOLIC PANEL WITH GFR
BUN: 17 mg/dL (ref 6–23)
CO2: 26 meq/L (ref 19–32)
Calcium: 9 mg/dL (ref 8.4–10.5)
Chloride: 105 meq/L (ref 96–112)
Creatinine, Ser: 0.56 mg/dL (ref 0.50–1.10)
GFR calc Af Amer: >90 mL/min
GFR calc non Af Amer: >90 mL/min
Glucose, Bld: 152 mg/dL — ABNORMAL HIGH (ref 70–99)
Potassium: 4.4 meq/L (ref 3.5–5.1)
Sodium: 140 meq/L (ref 135–145)

## 2012-02-25 LAB — ABO/RH: ABO/RH(D): O POS

## 2012-02-25 LAB — PLATELET FUNCTION ASSAY: Collagen / Epinephrine: 159 s (ref 0–184)

## 2012-02-25 LAB — TYPE AND SCREEN: Antibody Screen: NEGATIVE

## 2012-02-25 LAB — PROTIME-INR: Prothrombin Time: 13.2 seconds (ref 11.6–15.2)

## 2012-02-25 SURGERY — CRANIOTOMY HEMATOMA EVACUATION SUBDURAL
Anesthesia: General | Site: Head | Laterality: Left | Wound class: Clean

## 2012-02-25 MED ORDER — SODIUM CHLORIDE 0.9 % IR SOLN
Status: DC | PRN
  Administered 2012-02-25: 20:00:00

## 2012-02-25 MED ORDER — SODIUM CHLORIDE 0.9 % IV SOLN
INTRAVENOUS | Status: DC
  Administered 2012-02-25 – 2012-02-28 (×4): via INTRAVENOUS

## 2012-02-25 MED ORDER — PANTOPRAZOLE SODIUM 40 MG PO TBEC: 40.0000 mg | DELAYED_RELEASE_TABLET | Freq: Every day | ORAL | Status: DC

## 2012-02-25 MED ORDER — GLYCOPYRROLATE 0.2 MG/ML IJ SOLN
INTRAMUSCULAR | Status: DC | PRN
  Administered 2012-02-25: .6 mg via INTRAVENOUS

## 2012-02-25 MED ORDER — ACETAMINOPHEN 325 MG PO TABS: 650.0000 mg | ORAL_TABLET | ORAL | Status: DC | PRN

## 2012-02-25 MED ORDER — SODIUM CHLORIDE 0.9 % IV SOLN
INTRAVENOUS | Status: DC | PRN
  Administered 2012-02-25: 19:00:00 via INTRAVENOUS

## 2012-02-25 MED ORDER — SODIUM CHLORIDE 0.9 % IV BOLUS (SEPSIS)
1000.0000 mL | Freq: Once | INTRAVENOUS | Status: AC
  Administered 2012-02-25: 1000 mL via INTRAVENOUS

## 2012-02-25 MED ORDER — ROCURONIUM BROMIDE 100 MG/10ML IV SOLN
INTRAVENOUS | Status: DC | PRN
  Administered 2012-02-25: 40 mg via INTRAVENOUS

## 2012-02-25 MED ORDER — SODIUM CHLORIDE 0.9 % IV SOLN
INTRAVENOUS | Status: DC | PRN
  Administered 2012-02-25 (×2): via INTRAVENOUS

## 2012-02-25 MED ORDER — ADULT MULTIVITAMIN W/MINERALS CH
1.0000 | ORAL_TABLET | Freq: Every day | ORAL | Status: DC
  Administered 2012-02-26 – 2012-03-01 (×4): 1 via ORAL
  Filled 2012-02-25 (×5): qty 1

## 2012-02-25 MED ORDER — SENNOSIDES-DOCUSATE SODIUM 8.6-50 MG PO TABS
1.0000 | ORAL_TABLET | Freq: Two times a day (BID) | ORAL | Status: DC
  Administered 2012-02-26 – 2012-03-01 (×6): 1 via ORAL
  Filled 2012-02-25 (×11): qty 1

## 2012-02-25 MED ORDER — SODIUM CHLORIDE 0.9 % IV SOLN
10.0000 mg | INTRAVENOUS | Status: DC | PRN
  Administered 2012-02-25: 100 ug/min via INTRAVENOUS

## 2012-02-25 MED ORDER — BACITRACIN ZINC 500 UNIT/GM EX OINT
TOPICAL_OINTMENT | CUTANEOUS | Status: DC | PRN
  Administered 2012-02-25: 1 via TOPICAL

## 2012-02-25 MED ORDER — METOPROLOL SUCCINATE ER 25 MG PO TB24
37.5000 mg | ORAL_TABLET | Freq: Two times a day (BID) | ORAL | Status: DC
  Administered 2012-02-26: 37.5 mg via ORAL
  Filled 2012-02-25 (×5): qty 1

## 2012-02-25 MED ORDER — THROMBIN 20000 UNITS EX KIT
PACK | CUTANEOUS | Status: DC | PRN
  Administered 2012-02-25: 20:00:00 via TOPICAL

## 2012-02-25 MED ORDER — LABETALOL HCL 5 MG/ML IV SOLN
INTRAVENOUS | Status: DC | PRN
  Administered 2012-02-25: 5 mg via INTRAVENOUS

## 2012-02-25 MED ORDER — ONDANSETRON HCL 4 MG/2ML IJ SOLN
4.0000 mg | Freq: Four times a day (QID) | INTRAMUSCULAR | Status: DC | PRN
  Administered 2012-02-26 – 2012-02-29 (×4): 4 mg via INTRAVENOUS
  Filled 2012-02-25 (×4): qty 2

## 2012-02-25 MED ORDER — SODIUM CHLORIDE 0.9 % IV BOLUS (SEPSIS): 1000.0000 mL | Freq: Once | INTRAVENOUS | Status: DC

## 2012-02-25 MED ORDER — DEXAMETHASONE SODIUM PHOSPHATE 4 MG/ML IJ SOLN
4.0000 mg | Freq: Four times a day (QID) | INTRAMUSCULAR | Status: AC
  Administered 2012-02-25 – 2012-02-26 (×4): 4 mg via INTRAVENOUS
  Filled 2012-02-25 (×4): qty 1

## 2012-02-25 MED ORDER — NEOSTIGMINE METHYLSULFATE 1 MG/ML IJ SOLN
INTRAMUSCULAR | Status: DC | PRN
  Administered 2012-02-25: 4 mg via INTRAVENOUS

## 2012-02-25 MED ORDER — FENTANYL CITRATE 0.05 MG/ML IJ SOLN
INTRAMUSCULAR | Status: DC | PRN
  Administered 2012-02-25: 50 ug via INTRAVENOUS
  Administered 2012-02-25: 150 ug via INTRAVENOUS
  Administered 2012-02-25 (×2): 100 ug via INTRAVENOUS

## 2012-02-25 MED ORDER — EPHEDRINE SULFATE 50 MG/ML IJ SOLN
INTRAMUSCULAR | Status: DC | PRN
  Administered 2012-02-25: 5 mg via INTRAVENOUS
  Administered 2012-02-25 (×3): 10 mg via INTRAVENOUS
  Administered 2012-02-25: 5 mg via INTRAVENOUS

## 2012-02-25 MED ORDER — 0.9 % SODIUM CHLORIDE (POUR BTL) OPTIME
TOPICAL | Status: DC | PRN
  Administered 2012-02-25: 1000 mL

## 2012-02-25 MED ORDER — CEFAZOLIN SODIUM 1-5 GM-% IV SOLN
INTRAVENOUS | Status: DC | PRN
  Administered 2012-02-25: 2 g via INTRAVENOUS

## 2012-02-25 MED ORDER — LOSARTAN POTASSIUM 50 MG PO TABS
50.0000 mg | ORAL_TABLET | Freq: Every day | ORAL | Status: DC
  Administered 2012-02-26: 50 mg via ORAL
  Filled 2012-02-25 (×2): qty 1

## 2012-02-25 MED ORDER — ONDANSETRON HCL 4 MG/2ML IJ SOLN
INTRAMUSCULAR | Status: AC
  Administered 2012-02-25: 18:00:00
  Filled 2012-02-25: qty 2

## 2012-02-25 MED ORDER — ACETAMINOPHEN 325 MG PO TABS
650.0000 mg | ORAL_TABLET | Freq: Once | ORAL | Status: DC
  Filled 2012-02-25: qty 2

## 2012-02-25 MED ORDER — SODIUM CHLORIDE 0.9 % IV SOLN
500.0000 mg | Freq: Two times a day (BID) | INTRAVENOUS | Status: DC
  Administered 2012-02-26 – 2012-03-01 (×10): 500 mg via INTRAVENOUS
  Filled 2012-02-25 (×11): qty 5

## 2012-02-25 MED ORDER — LABETALOL HCL 5 MG/ML IV SOLN
10.0000 mg | INTRAVENOUS | Status: DC | PRN
  Administered 2012-02-25 – 2012-02-26 (×3): 10 mg via INTRAVENOUS
  Filled 2012-02-25 (×2): qty 4

## 2012-02-25 MED ORDER — THROMBIN 5000 UNITS EX SOLR
CUTANEOUS | Status: DC | PRN
  Administered 2012-02-25: 20:00:00 via TOPICAL

## 2012-02-25 MED ORDER — ONDANSETRON HCL 4 MG/2ML IJ SOLN: 4.0000 mg | Freq: Once | INTRAMUSCULAR | Status: DC | PRN

## 2012-02-25 MED ORDER — LIDOCAINE HCL (CARDIAC) 20 MG/ML IV SOLN
INTRAVENOUS | Status: DC | PRN
  Administered 2012-02-25: 100 mg via INTRAVENOUS

## 2012-02-25 MED ORDER — ACETAMINOPHEN 650 MG RE SUPP: 650.0000 mg | RECTAL | Status: DC | PRN

## 2012-02-25 MED ORDER — ONDANSETRON HCL 4 MG/2ML IJ SOLN
INTRAMUSCULAR | Status: DC | PRN
  Administered 2012-02-25 (×2): 4 mg via INTRAVENOUS

## 2012-02-25 MED ORDER — HYDROMORPHONE HCL PF 1 MG/ML IJ SOLN
0.2500 mg | INTRAMUSCULAR | Status: DC | PRN
  Administered 2012-02-25 – 2012-02-27 (×3): 0.5 mg via INTRAVENOUS
  Filled 2012-02-25 (×2): qty 1

## 2012-02-25 MED ORDER — PANTOPRAZOLE SODIUM 40 MG IV SOLR
40.0000 mg | Freq: Every day | INTRAVENOUS | Status: DC
  Administered 2012-02-26 – 2012-02-29 (×4): 40 mg via INTRAVENOUS
  Filled 2012-02-25 (×5): qty 40

## 2012-02-25 MED ORDER — MORPHINE SULFATE 2 MG/ML IJ SOLN
1.0000 mg | INTRAMUSCULAR | Status: DC | PRN
  Administered 2012-02-26 (×2): 2 mg via INTRAVENOUS
  Administered 2012-02-26: 4 mg via INTRAVENOUS
  Administered 2012-02-26 (×2): 2 mg via INTRAVENOUS
  Administered 2012-02-26: 4 mg via INTRAVENOUS
  Administered 2012-02-26 – 2012-02-27 (×6): 2 mg via INTRAVENOUS
  Administered 2012-02-27: 4 mg via INTRAVENOUS
  Administered 2012-02-28 (×3): 2 mg via INTRAVENOUS
  Administered 2012-02-28: 4 mg via INTRAVENOUS
  Administered 2012-02-28 – 2012-03-01 (×12): 2 mg via INTRAVENOUS
  Filled 2012-02-25 (×9): qty 1
  Filled 2012-02-25: qty 2
  Filled 2012-02-25 (×4): qty 1
  Filled 2012-02-25: qty 2
  Filled 2012-02-25 (×2): qty 1
  Filled 2012-02-25: qty 2
  Filled 2012-02-25: qty 1
  Filled 2012-02-25: qty 2
  Filled 2012-02-25 (×10): qty 1

## 2012-02-25 MED ORDER — METFORMIN HCL 500 MG PO TABS
500.0000 mg | ORAL_TABLET | Freq: Every day | ORAL | Status: DC
  Administered 2012-02-26: 500 mg via ORAL
  Filled 2012-02-25 (×3): qty 1

## 2012-02-25 MED ORDER — ONDANSETRON 4 MG PO TBDP
4.0000 mg | ORAL_TABLET | Freq: Once | ORAL | Status: AC
  Administered 2012-02-25: 4 mg via ORAL
  Filled 2012-02-25: qty 1

## 2012-02-25 MED ORDER — HEMOSTATIC AGENTS (NO CHARGE) OPTIME
TOPICAL | Status: DC | PRN
  Administered 2012-02-25 (×2): 1 via TOPICAL

## 2012-02-25 MED ORDER — SUCCINYLCHOLINE CHLORIDE 20 MG/ML IJ SOLN
INTRAMUSCULAR | Status: DC | PRN
  Administered 2012-02-25: 100 mg via INTRAVENOUS

## 2012-02-25 MED ORDER — PROPOFOL 10 MG/ML IV EMUL
INTRAVENOUS | Status: DC | PRN
  Administered 2012-02-25: 160 mg via INTRAVENOUS
  Administered 2012-02-25: 40 mg via INTRAVENOUS
  Administered 2012-02-25: 50 mg via INTRAVENOUS

## 2012-02-25 MED ORDER — SERTRALINE HCL 50 MG PO TABS
150.0000 mg | ORAL_TABLET | Freq: Every day | ORAL | Status: DC
  Administered 2012-02-26 – 2012-03-01 (×4): 150 mg via ORAL
  Filled 2012-02-25 (×6): qty 1

## 2012-02-25 MED ORDER — LIDOCAINE-EPINEPHRINE 1 %-1:100000 IJ SOLN
INTRAMUSCULAR | Status: DC | PRN
  Administered 2012-02-25: 10 mL

## 2012-02-25 SURGICAL SUPPLY — 63 items
BAG DECANTER FOR FLEXI CONT (MISCELLANEOUS) ×2 IMPLANT
BUR ROUTER D-58 CRANI (BURR) ×2 IMPLANT
CANISTER SUCTION 2500CC (MISCELLANEOUS) ×2 IMPLANT
CLIP TI MEDIUM 6 (CLIP) IMPLANT
CLOTH BEACON ORANGE TIMEOUT ST (SAFETY) ×2 IMPLANT
CONT SPEC 4OZ CLIKSEAL STRL BL (MISCELLANEOUS) ×2 IMPLANT
CORDS BIPOLAR (ELECTRODE) ×2 IMPLANT
COVER TABLE BACK 60X90 (DRAPES) IMPLANT
DRAIN CHANNEL 10M FLAT 3/4 FLT (DRAIN) ×2 IMPLANT
DRAPE MICROSCOPE ZEISS OPMI (DRAPES) IMPLANT
DRAPE NEUROLOGICAL W/INCISE (DRAPES) ×2 IMPLANT
DRAPE SURG 17X23 STRL (DRAPES) IMPLANT
DRAPE WARM FLUID 44X44 (DRAPE) ×2 IMPLANT
DRESSING TELFA 8X3 (GAUZE/BANDAGES/DRESSINGS) IMPLANT
DURAPREP 6ML APPLICATOR 50/CS (WOUND CARE) ×2 IMPLANT
ELECT CAUTERY BLADE 6.4 (BLADE) ×2 IMPLANT
ELECT REM PT RETURN 9FT ADLT (ELECTROSURGICAL) ×2
ELECTRODE REM PT RTRN 9FT ADLT (ELECTROSURGICAL) ×1 IMPLANT
EVACUATOR 1/8 PVC DRAIN (DRAIN) ×4 IMPLANT
EVACUATOR SILICONE 100CC (DRAIN) ×2 IMPLANT
GAUZE SPONGE 4X4 16PLY XRAY LF (GAUZE/BANDAGES/DRESSINGS) IMPLANT
GLOVE BIO SURGEON STRL SZ8 (GLOVE) ×6 IMPLANT
GLOVE BIOGEL PI IND STRL 8.5 (GLOVE) ×1 IMPLANT
GLOVE BIOGEL PI INDICATOR 8.5 (GLOVE) ×1
GLOVE SURG SS PI 8.0 STRL IVOR (GLOVE) ×4 IMPLANT
GOWN BRE IMP SLV AUR LG STRL (GOWN DISPOSABLE) IMPLANT
GOWN BRE IMP SLV AUR XL STRL (GOWN DISPOSABLE) ×2 IMPLANT
GOWN STRL REIN 2XL LVL4 (GOWN DISPOSABLE) IMPLANT
HEMOSTAT POWDER KIT SURGIFOAM (HEMOSTASIS) ×2 IMPLANT
HEMOSTAT SURGICEL 2X14 (HEMOSTASIS) ×2 IMPLANT
HOOK DURA (MISCELLANEOUS) ×2 IMPLANT
KIT BASIN OR (CUSTOM PROCEDURE TRAY) ×2 IMPLANT
KIT ROOM TURNOVER OR (KITS) ×2 IMPLANT
NEEDLE HYPO 22GX1.5 SAFETY (NEEDLE) ×2 IMPLANT
NS IRRIG 1000ML POUR BTL (IV SOLUTION) ×4 IMPLANT
PACK CRANIOTOMY (CUSTOM PROCEDURE TRAY) ×2 IMPLANT
PAD ARMBOARD 7.5X6 YLW CONV (MISCELLANEOUS) ×2 IMPLANT
PATTIES SURGICAL .25X.25 (GAUZE/BANDAGES/DRESSINGS) IMPLANT
PATTIES SURGICAL .5 X.5 (GAUZE/BANDAGES/DRESSINGS) IMPLANT
PATTIES SURGICAL .5 X3 (DISPOSABLE) IMPLANT
PATTIES SURGICAL 1X1 (DISPOSABLE) IMPLANT
PERFORATOR LRG  14-11MM (BIT) ×1
PERFORATOR LRG 14-11MM (BIT) ×1 IMPLANT
PIN MAYFIELD SKULL DISP (PIN) ×2 IMPLANT
PLATE 1.5  2HOLE LNG NEURO (Plate) ×3 IMPLANT
PLATE 1.5 2HOLE LNG NEURO (Plate) ×3 IMPLANT
RUBBERBAND STERILE (MISCELLANEOUS) IMPLANT
SCREW SELF DRILL HT 1.5/4MM (Screw) ×12 IMPLANT
SPONGE GAUZE 4X4 12PLY (GAUZE/BANDAGES/DRESSINGS) ×2 IMPLANT
SPONGE NEURO XRAY DETECT 1X3 (DISPOSABLE) IMPLANT
SPONGE SURGIFOAM ABS GEL 100 (HEMOSTASIS) ×2 IMPLANT
STAPLER VISISTAT 35W (STAPLE) ×2 IMPLANT
SUT ETHILON 3 0 FSL (SUTURE) IMPLANT
SUT NURALON 4 0 TR CR/8 (SUTURE) ×4 IMPLANT
SUT VIC AB 2-0 CP2 18 (SUTURE) ×4 IMPLANT
SYR 20ML ECCENTRIC (SYRINGE) ×2 IMPLANT
SYR CONTROL 10ML LL (SYRINGE) ×2 IMPLANT
TAPE CLOTH SURG 4X10 WHT LF (GAUZE/BANDAGES/DRESSINGS) ×2 IMPLANT
TOWEL OR 17X24 6PK STRL BLUE (TOWEL DISPOSABLE) ×2 IMPLANT
TOWEL OR 17X26 10 PK STRL BLUE (TOWEL DISPOSABLE) ×2 IMPLANT
TRAY FOLEY CATH 14FRSI W/METER (CATHETERS) ×2 IMPLANT
UNDERPAD 30X30 INCONTINENT (UNDERPADS AND DIAPERS) IMPLANT
WATER STERILE IRR 1000ML POUR (IV SOLUTION) ×2 IMPLANT

## 2012-02-25 NOTE — ED Notes (Addendum)
MD in room at this time. Pt alert oriented x4. Speech slow and slurred at times, pupils equal and sluggish, pt reports feeling "more tired" than usual. When not verbally stimulated pt rest with eyes closed.

## 2012-02-25 NOTE — Anesthesia Preprocedure Evaluation (Addendum)
Anesthesia Evaluation  Patient identified by MRN, date of birth, ID band Patient awake    Reviewed: Allergy & Precautions, H&P , NPO status , Patient's Chart, lab work & pertinent test results, reviewed documented beta blocker date and time   Airway Mallampati: I TM Distance: >3 FB Neck ROM: Full    Dental  (+) Teeth Intact and Dental Advisory Given   Pulmonary former smoker breath sounds clear to auscultation        Cardiovascular hypertension, Pt. on home beta blockers + Past MI, + CABG and + Peripheral Vascular Disease Rhythm:Regular Rate:Normal     Neuro/Psych  Headaches, PSYCHIATRIC DISORDERS Depression    GI/Hepatic   Endo/Other  Diabetes mellitus-, Well Controlled, Type 2, Oral Hypoglycemic AgentsMorbid obesity  Renal/GU      Musculoskeletal   Abdominal   Peds  Hematology   Anesthesia Other Findings   Reproductive/Obstetrics                       Anesthesia Physical Anesthesia Plan  ASA: IV  Anesthesia Plan: General   Post-op Pain Management:    Induction: Intravenous  Airway Management Planned: Oral ETT  Additional Equipment: Arterial line  Intra-op Plan:   Post-operative Plan: Extubation in OR  Informed Consent: I have reviewed the patients History and Physical, chart, labs and discussed the procedure including the risks, benefits and alternatives for the proposed anesthesia with the patient or authorized representative who has indicated his/her understanding and acceptance.   Dental advisory given  Plan Discussed with: Anesthesiologist, CRNA and Surgeon  Anesthesia Plan Comments:         Anesthesia Quick Evaluation

## 2012-02-25 NOTE — Op Note (Signed)
02/25/2012  8:52 PM  PATIENT:  Alicia Crawford  56 y.o. female  PRE-OPERATIVE DIAGNOSIS:  Left acute subdural hematoma  POST-OPERATIVE DIAGNOSIS:  Same  PROCEDURE:  Left craniotomy for evacuation of subdural hematoma  SURGEON:  Marikay Alar, MD  ASSISTANTS: None  ANESTHESIA:   General  EBL: 100 ml  Total I/O In: 1000 [I.V.:1000] Out: 350 [Urine:250; Blood:100]  BLOOD ADMINISTERED:none  DRAINS: Medium Hemovac and 10 flat fluted Blake   SPECIMEN:  No Specimen  INDICATION FOR PROCEDURE: This patient fell yesterday and presented today with severe headache. CT scan showed a large left acute subdural hematoma with mass effect and shift. I recommended a craniotomy for evacuation of the lesion. Patient understood the risks, benefits, and alternatives and potential outcomes and wished to proceed.  PROCEDURE DETAILS: The patient was taken to the operating room and after induction of adequate generalized endotracheal anesthesia, the head was affixed in a 3 point May[place] head rest, and turned to the right to expose the  left frontotemporal parietal region. The head was shaved and then cleaned and then prepped with DuraPrep and draped in the usual sterile fashion. 10 cc of local anesthetic was injected, and a left frontotemporal incision was made on the left of the head. Raney clips were placed to establish hemostasis of the scalp, the muscle was reflected with the scalp flap, to expose the left frontotemporal region. A burr hole was placed near the keyhole and in the inferior temporal region, and a craniotomy flap was turned utilizing the high-speed, air poweedr drill. The flap was then placed in bacitracin-containing saline solution, and the dura was opened to expose a large left acute subdural hematoma. A hematoma was then removed with a combination of irrigation and suction. A small left cortical arterial bleeder was encountered and coagulated . I continued to irrigate until the irrigant  was clear to, and dried any bleeding with bipolar cautery. I then placed a subdural drain through separate stab incision and close the dura with a running 4-0 Nurolon suture. Dural tack up sutures were placed. The dura was lined with Gelfoam, and the craniotomy flap was replaced with doggie-bone plates. The wound was copiously irrigated. A subgaleal drain was placed, and the galea was then closed with interrupted 2-0 Vicryl suture. The skin was then closed with staples a sterile dressing was applied. The patient was then taken out of the 3-point Mayfield headrest and awakened from general anesthesia, and transported to the recovery room in guarded condition. At the end of the procedure all sponge, needle, and instrument counts were correct.   PLAN OF CARE: Admit to inpatient   PATIENT DISPOSITION:  PACU - hemodynamically stable.   Delay start of Pharmacological VTE agent (>24hrs) due to surgical blood loss or risk of bleeding:  yes

## 2012-02-25 NOTE — OR Nursing (Signed)
Husbands # [phone]

## 2012-02-25 NOTE — ED Notes (Addendum)
Pt in per [facility] from home, pt fell yesterday reported hitting head on wood reports dizziness afterwards, denies LOC, woke up today 15:00 reported N/V, vomiting x 3 episodes with constant nausea, pt given 4mg  Zofran in route, pt hx of cardiac arrest 2011, pt lives with husband, pt A&O x4, pupils reactive, speech clear, will continue to monitor

## 2012-02-25 NOTE — Transfer of Care (Signed)
Immediate Anesthesia Transfer of Care Note  Patient: Alicia Crawford  Procedure(s) Performed: Procedure(s) (LRB): CRANIOTOMY HEMATOMA EVACUATION SUBDURAL (Left)  Patient Location: PACU  Anesthesia Type: General  Level of Consciousness: awake and alert   Airway & Oxygen Therapy: Patient connected to face mask oxygen  Post-op Assessment: Report given to PACU RN, Post -op Vital signs reviewed and stable and Patient moving all extremities X 4  Post vital signs: Reviewed and stable  Complications: persistent nausea and vomiting

## 2012-02-25 NOTE — Preoperative (Addendum)
Beta Blockers   Reason not to administer Beta Blockers: Toprol XL taken this am at 1000

## 2012-02-25 NOTE — ED Notes (Signed)
Pt husband reports pt fell yesterday AM around 0700. Pt states she started with a HA yesterday afternoon/evening with increased pain today with nausea/vomiting.

## 2012-02-25 NOTE — H&P (Signed)
Reason for Consult: Left subdural hematoma Referring Physician: EDP  Alicia Crawford is an 56 y.o. female.   HPI:  This is a 56 year old female on aspirin who fell backwards yesterday on a wet ramp and struck the back of her head. She denies loss of consciousness. She had some headache yesterday but it was mild. He worsens today to the point that she had nausea and vomiting. It is a frontal headache that is rated as a 7/10 on the pain scale and is aching and throbbing in character. Denies numbness tingling weakness seizures or visual changes. Takes no anticoagulants. History of heart disease and peripheral vascular disease. No other history of trauma. Presented to the emergency department today with these complaints and head CT showed a large left subdural hematoma and neurosurgical evaluation was requested.  Past Medical History  Diagnosis Date  . Myocardial infarction   . Hypertension   . Leg pain   . Anoxic brain injury 08/2010    "w/cardiac arrest"  . Rotator cuff tendonitis   . Complication of anesthesia     [place]  . High cholesterol   . Cardiac arrest 08/04/2010  . Type II diabetes mellitus   . Blood transfusion 09/2010  . Depression   . DEMENTIA     Past Surgical History  Procedure Date  . Ovarian cyst removal 1983  . Stomach and skin tuck 2000  . Posterior fusion lumbar spine ` 2000  . Cholecystectomy ~ 1980's  . 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 Cardiac surgery   . Back surgery     Allergies  Allergen Reactions  . Halothane Hives and Other (See Comments)    Gas used 1980's Reaction: affected her liver    History  Substance Use Topics  . Smoking status: Former Smoker -- 1.0 packs/day for 30 years    Types: Cigarettes    Quit date: 06/22/2005  . Smokeless tobacco: Never Used  . Alcohol Use: Yes     01/17/12 "used to have glass of wine now & then; last wine was ~ 2011"    Family History  Problem Relation Age of Onset  . Heart disease  Other   . Diabetes Other   . Hypertension Other   . Alcohol abuse Other   . Mental illness Other   . Heart disease Mother   . Heart disease Father   . Heart disease Sister      Review of Systems  Positive ROS: negative, no fevers chills or chest pain  All other systems have been reviewed and were otherwise negative with the exception of those mentioned in the HPI and as above.  Objective: Vital signs in last 24 hours: Temp:  [97.5 F (36.4 C)] 97.5 F (36.4 C) (05/25 1646) Pulse Rate:  [65-126] 126  (05/25 1800) Resp:  [10-19] 18  (05/25 1800) BP: (88-117)/(35-62) 117/55 mmHg (05/25 1800) SpO2:  [76 %-100 %] 76 % (05/25 1800)  General Appearance: Alert, cooperative, no distress, appears stated age Head: Normocephalic, without obvious abnormality, atraumatic Eyes: PERRL, conjunctiva/corneas clear, EOM's intact, fundi benign, both eyes      Ears: Normal TM's and external ear canals, both ears Throat: benign  Neck: Supple, symmetrical, trachea midline, no adenopathy; thyroid: No enlargement/tenderness/nodules; no carotid bruit or JVD Back: Symmetric, no curvature, ROM normal, no CVA tenderness Lungs: Clear to auscultation bilaterally, respirations unlabored Heart: Regular rate and rhythm, S1 and S2 normal, no murmur, rub or gallop Abdomen: Soft, non-tender, bowel  sounds active all four quadrants, no masses, no organomegaly Extremities: Extremities normal, atraumatic, no cyanosis or edema Pulses: 2+ and symmetric all extremities Skin: Skin color, texture, turgor normal, no rashes or lesions  NEUROLOGIC:   Mental status: A&O x4, no aphasia, good attention span, Memory and fund of knowledge Motor Exam - grossly normal, normal tone and bulk, no pronator drift Sensory Exam - grossly normal Reflexes: symmetric, no pathologic reflexes, No Hoffman's, No clonus Coordination - grossly normal Gait - not tested Balance - not tested Cranial Nerves: I: smell Not tested  II: visual  acuity  OS: nl    OD: nl  Visual fields  Full to confrontation  II: pupils Equal, round, reactive to light  III,VII: ptosis None  III,IV,VI: extraocular muscles  Full ROM  V: mastication Normal  V: facial light touch sensation  Normal  V,VII: corneal reflex  Present  VII: facial muscle function - upper  Normal  VII: facial muscle function - lower Normal  VIII: hearing Not tested  IX: soft palate elevation  Normal  IX,X: gag reflex Present  XI: trapezius strength  5/5  XI: sternocleidomastoid strength 5/5  XI: neck flexion strength  5/5  XII: tongue strength  Normal    Data Review Lab Results  Component Value Date   WBC 9.7 02/25/2012   HGB 11.7* 02/25/2012   HCT 34.6* 02/25/2012   MCV 91.3 02/25/2012   PLT 234 02/25/2012   Lab Results  Component Value Date   NA 138 04/28/2011   K 4.0 04/28/2011   CL 104 04/28/2011   CO2 25 04/28/2011   BUN 16 04/28/2011   CREATININE 0.67 04/28/2011   GLUCOSE 114* 04/28/2011   Lab Results  Component Value Date   INR 0.98 02/25/2012    Radiology: No results found. CT scan: Large left acute subdural hematoma with mass effect and shift of the midline structures. This measured greater than 1 cm interfaces the basal cistern on the left  Assessment/Plan: Large left acute subdural hematoma. The patient is doing quite well neurologically at this point, but I feel is at a risk of deterioration. I believe this needs surgical evacuation. I have gone over the films with the patient and her husband in detail and described the findings on the films and how they relate to her symptoms. I described surgery to them as best I can. We have talked about typical outcomes and recovery times. He understands the risk of the surgery include but are not limited to bleeding, infection, stroke, numbness, weakness, paralysis visual loss, seizures, aphasia, and even death. He understands she is doing quite well neurologically and could deteriorate after surgery or before surgery.  He understands the risk of aphasia is higher because of the left-sided location. They understand that her cardiac, diabetic, peripheral vascular risks must be accounted for and increase the risk of treatment. They understand she will be admitted to the ICU after surgery.    Alicia Crawford S 02/25/2012 6:30 PM

## 2012-02-25 NOTE — Anesthesia Procedure Notes (Signed)
Procedure Name: Intubation Date/Time: 02/25/2012 7:10 PM Performed by: Molli Hazard Pre-anesthesia Checklist: Patient identified, Emergency Drugs available, Suction available and Patient being monitored Patient Re-evaluated:Patient Re-evaluated prior to inductionOxygen Delivery Method: Circle system utilized Preoxygenation: Pre-oxygenation with 100% oxygen Intubation Type: IV induction Ventilation: Mask ventilation without difficulty Laryngoscope Size: Miller and 2 Grade View: Grade I Tube type: Oral (subglottic ) Tube size: 7.5 mm Number of attempts: 1 Airway Equipment and Method: Stylet Placement Confirmation: ETT inserted through vocal cords under direct vision,  positive ETCO2 and breath sounds checked- equal and bilateral Secured at: 21 cm Tube secured with: Tape Dental Injury: Teeth and Oropharynx as per pre-operative assessment

## 2012-02-25 NOTE — Anesthesia Postprocedure Evaluation (Signed)
  Anesthesia Post-op Note  Patient: Alicia Crawford  Procedure(s) Performed: Procedure(s) (LRB): CRANIOTOMY HEMATOMA EVACUATION SUBDURAL (Left)  Patient Location: PACU  Anesthesia Type: General  Level of Consciousness: awake, alert  and oriented  Airway and Oxygen Therapy: Patient Spontanous Breathing and Patient connected to nasal cannula oxygen  Post-op Pain: mild  Post-op Assessment: Post-op Vital signs reviewed  Post-op Vital Signs: Reviewed  Complications: No apparent anesthesia complications

## 2012-02-25 NOTE — ED Notes (Signed)
Pt husband at bedside at this time.

## 2012-02-25 NOTE — ED Provider Notes (Signed)
History     CSN: [id number]  Arrival date & time 02/25/12  1637   First MD Initiated Contact with Patient 02/25/12 1646      Chief Complaint  Patient presents with  . Emesis    (Consider location/radiation/quality/duration/timing/severity/associated sxs/prior treatment) Patient is a 56 y.o. female presenting with headaches. The history is provided by the patient.  Headache  This is a new problem. The current episode started yesterday. The problem occurs constantly. The problem has been gradually worsening. Associated with: after hitting head in mechanical fall yesterday. The quality of the pain is described as throbbing. The pain is moderate. The pain does not radiate. Associated symptoms include nausea and vomiting (x3 today). Pertinent negatives include no shortness of breath. She has tried nothing for the symptoms.    Past Medical History  Diagnosis Date  . Myocardial infarction   . Hypertension   . Leg pain   . Anoxic brain injury 08/2010    "w/cardiac arrest"  . Rotator cuff tendonitis   . Complication of anesthesia     [place]  . High cholesterol   . Cardiac arrest 08/04/2010  . Type II diabetes mellitus   . Blood transfusion 09/2010  . Depression   . DEMENTIA     Past Surgical History  Procedure Date  . Ovarian cyst removal 1983  . Stomach and skin tuck 2000  . Posterior fusion lumbar spine ` 2000  . Cholecystectomy ~ 1980's  . 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 Cardiac surgery   . Back surgery     Family History  Problem Relation Age of Onset  . Heart disease Other   . Diabetes Other   . Hypertension Other   . Alcohol abuse Other   . Mental illness Other   . Heart disease Mother   . Heart disease Father   . Heart disease Sister     History  Substance Use Topics  . Smoking status: Former Smoker -- 1.0 packs/day for 30 years    Types: Cigarettes    Quit date: 06/22/2005  . Smokeless tobacco: Never Used  . Alcohol Use: Yes   01/17/12 "used to have glass of wine now & then; last wine was ~ 2011"    OB History    Grav Para Term Preterm Abortions TAB SAB Ect Mult Living                  Review of Systems  Constitutional: Negative for activity change.  HENT: Negative for congestion.   Eyes: Negative for visual disturbance.  Respiratory: Negative for chest tightness and shortness of breath.   Cardiovascular: Negative for chest pain and leg swelling.  Gastrointestinal: Positive for nausea and vomiting (x3 today). Negative for abdominal pain.  Genitourinary: Negative for dysuria.  Skin: Negative for rash.  Neurological: Positive for headaches. Negative for syncope.  Psychiatric/Behavioral: Negative for behavioral problems.    Allergies  [place]  Home Medications   Current Outpatient Rx  Name Route Sig Dispense Refill  . ACETYLCARNITINE HCL PO Oral Take 1 capsule by mouth daily.    . ASPIRIN EC 325 MG PO TBEC Oral Take 162.5 mg by mouth daily. TAKE 1/2 DAILY    . ATORVASTATIN CALCIUM 80 MG PO TABS Oral Take 80 mg by mouth daily.    [name] COENZYME Q10 300 MG PO CAPS Oral Take 1 capsule by mouth daily.    . IBUPROFEN PM PO Oral Take 2 tablets by mouth at  bedtime.    [name] LECITHIN PO Oral Take 1,200 mg by mouth daily.    [name] LORAZEPAM 1 MG PO TABS Oral Take 1 mg by mouth at bedtime.     [name] LOSARTAN POTASSIUM 50 MG PO TABS Oral Take 50 mg by mouth daily.      [name] METFORMIN HCL 500 MG PO TABS Oral Take 500 mg by mouth daily.    [name] METOPROLOL SUCCINATE ER 25 MG PO TB24 Oral Take 37.5 mg by mouth 2 (two) times daily.     . ADULT MULTIVITAMIN W/MINERALS CH Oral Take 1 tablet by mouth daily.    [name] OVER THE COUNTER MEDICATION Oral Take 1 capsule by mouth daily. Mega Red    . PANTOPRAZOLE SODIUM 40 MG PO TBEC Oral Take 40 mg by mouth daily.      [name] CALCIUM POLYCARBOPHIL 625 MG PO TABS Oral Take 625 mg by mouth daily.    . SERTRALINE HCL 50 MG PO TABS Oral Take 150 mg by mouth daily.      BP 117/55  Pulse 126  Temp(Src)  97.5 F (36.4 C) (Oral)  Resp 18  SpO2 76%  Physical Exam  Constitutional: She is oriented to person, place, and time. She appears well-developed and well-nourished.  HENT:  Head: Normocephalic and atraumatic.  Eyes: Conjunctivae and EOM are normal. Pupils are equal, round, and reactive to light. No scleral icterus.  Neck: Normal range of motion. Neck supple.       No c spine ttp.  Cardiovascular: Normal rate and regular rhythm.  Exam reveals no gallop and no friction rub.   No murmur heard. Pulmonary/Chest: Effort normal and breath sounds normal. No respiratory distress. She has no wheezes. She has no rales. She exhibits no tenderness.  Abdominal: Soft. She exhibits no distension and no mass. There is no tenderness. There is no rebound and no guarding.  Musculoskeletal: Normal range of motion.  Neurological: She is alert and oriented to person, place, and time. She has normal reflexes. No cranial nerve deficit.       5/5 strength in all extremities.  Skin: Skin is warm and dry. No rash noted.  Psychiatric: She has a normal mood and affect. Her behavior is normal. Judgment and thought content normal.    ED Course  Procedures (including critical care time)   Date: 02/25/2012  Rate: 61  Rhythm: normal sinus rhythm  QRS Axis: normal  Intervals: normal  ST/T Wave abnormalities: normal  Conduction Disutrbances:none  Narrative Interpretation:   Old EKG Reviewed: unchanged and changes noted    Labs Reviewed  CBC - Abnormal; Notable for the following:    RBC 3.79 (*)    Hemoglobin 11.7 (*)    HCT 34.6 (*)    All other components within normal limits  BASIC METABOLIC PANEL - Abnormal; Notable for the following:    Glucose, Bld 152 (*)    All other components within normal limits  GLUCOSE, CAPILLARY - Abnormal; Notable for the following:    Glucose-Capillary 137 (*)    All other components within normal limits  PROTIME-INR  TYPE AND SCREEN  PLATELET FUNCTION ASSAY   Ct  Head Wo Contrast  02/25/2012  *RADIOLOGY REPORT*  Clinical Data: Fall, hit back of head, headaches  CT HEAD WITHOUT CONTRAST  Technique:  Contiguous axial images were obtained from the base of the skull through the vertex without contrast.  Comparison: None.  Findings: Subdural hematoma overlying the left frontal convexity measuring up to 1.8  cm in thickness (series 2/image 18). Hemorrhage overlies the temporal lobe (series 2/image 7) and is also along the left falx (series 2/image 22).  Underlying mass effect with sulcal effacement.  10 mm rightward midline shift (series 2/image 12).  Mass effect on the posterior and temporal horns of the left lateral ventricle.  The basal cisterns remain patent.  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 Intracranial atherosclerosis.  Cerebral volume is age appropriate.  No ventriculomegaly.  No intraventricular hemorrhage.  The visualized paranasal sinuses are essentially clear. The mastoid air cells are unopacified.  No evidence of calvarial fracture.  IMPRESSION: Subdural hematoma overlying the left frontal convexity measuring up to 1.8 cm in thickness, as described above.  Underlying mass effect with sulcal effacement and 10 mm rightward midline shift.  The basal cisterns remain patent.  Critical Value/emergent results were called by telephone at the time of interpretation on 02/25/2012  at 1730 hours  to  Dr. Radford Pax, who verbally acknowledged these results.  Original Report Authenticated By: Charline Bills, M.D.     1. Subdural hematoma   2. Vomiting   3. Headache       MDM  Mechanical fall yesterday and hit head on wood floor.  Vomiting, headache today.  VSS and well appearing.  No neuro deficits.  No abdominal pain or tenderness.  VSS.  Head CT with large SDH.  GCS 15.  [facility] consulted and to admit. BP well controlled.  No blood thinners.         Army Chaco, MD 02/25/12 [phone]

## 2012-02-26 ENCOUNTER — Inpatient Hospital Stay (HOSPITAL_COMMUNITY): Payer: PRIVATE HEALTH INSURANCE

## 2012-02-26 ENCOUNTER — Encounter (HOSPITAL_COMMUNITY): Payer: Self-pay

## 2012-02-26 LAB — GLUCOSE, CAPILLARY: Glucose-Capillary: 191 mg/dL — ABNORMAL HIGH (ref 70–99)

## 2012-02-26 LAB — MRSA PCR SCREENING: MRSA by PCR: NEGATIVE

## 2012-02-26 NOTE — Evaluation (Signed)
Physical Therapy Evaluation Patient Details Name: Alicia Crawford MRN: [id number] DOB: [birth year] Today's Date: 02/26/2012 Time: 3244-0102 PT Time Calculation (min): 30 min  PT Assessment / Plan / Recommendation Clinical Impression  Pt presents with a SDH following a fall s/p crani. Pt with aphasia and difficulty maintaining arousal during evaluation. Unable to determine any cognition loss due to aphasia and consciousness. Will continue to evaluate. Pts mobility limited to dizziness upon standing and feelings of maliase. Pt will benefit from skilled PT in the acute care setting in order to maximize functional mobility and safety    PT Assessment  Patient needs continued PT services    Follow Up Recommendations  Inpatient Rehab;Supervision/Assistance - 24 hour    Barriers to Discharge        lEquipment Recommendations  Defer to next venue    Recommendations for Other Services Rehab consult   Frequency Min 4X/week    Precautions / Restrictions Precautions Precautions: Other (comment) (crani) Restrictions Weight Bearing Restrictions: No   Pertinent Vitals/Pain BP increased with sitting and standing, RN aware.      Mobility  Bed Mobility Bed Mobility: Supine to Sit;Sitting - Scoot to Edge of Bed;Sit to Supine Supine to Sit: 4: Min assist;With rails;HOB elevated Sitting - Scoot to Edge of Bed: 4: Min assist;With rail Sit to Supine: 4: Min assist;With rail;HOB elevated Details for Bed Mobility Assistance: VC for sequencing. Pt was impulsive to get up but frustrated due to aphasia. Minimal assistance for safety Transfers Transfers: Sit to Stand;Stand to Sit;Stand Pivot Transfers Sit to Stand: 4: Min assist;With upper extremity assist;From bed;From chair/3-in-1 Stand to Sit: 4: Min assist;With upper extremity assist;To bed;To chair/3-in-1 Stand Pivot Transfers: 4: Min assist;With armrests Details for Transfer Assistance: VC for sequencing. Assist for stability and safety.  Transferred from bed to chair and back to bed after ~10 minutes per patients request Ambulation/Gait Ambulation/Gait Assistance: Not tested (comment)    Exercises     PT Diagnosis: Abnormality of gait;Acute pain  PT Problem List: Decreased activity tolerance;Decreased balance;Decreased mobility;Decreased knowledge of use of DME;Decreased safety awareness;Decreased knowledge of precautions;Pain PT Treatment Interventions: DME instruction;Gait training;Stair training;Functional mobility training;Therapeutic activities;Therapeutic exercise;Balance training;Neuromuscular re-education;Patient/family education   PT Goals Acute Rehab PT Goals PT Goal Formulation: With patient/family Time For Goal Achievement: 03/11/12 Potential to Achieve Goals: Fair Pt will go Supine/Side to Sit: with modified independence PT Goal: Supine/Side to Sit - Progress: Goal set today Pt will go Sit to Supine/Side: with modified independence PT Goal: Sit to Supine/Side - Progress: Goal set today Pt will go Sit to Stand: with supervision PT Goal: Sit to Stand - Progress: Goal set today Pt will go Stand to Sit: with supervision PT Goal: Stand to Sit - Progress: Goal set today Pt will Transfer Bed to Chair/Chair to Bed: with supervision PT Transfer Goal: Bed to Chair/Chair to Bed - Progress: Goal set today Pt will Ambulate: 51 - 150 feet;with supervision;with least restrictive assistive device PT Goal: Ambulate - Progress: Goal set today  Visit Information  Last PT Received On: 02/26/12 Assistance Needed: +2    Subjective Data      Prior Functioning  Home Living Lives With: Spouse Available Help at Discharge: Family;Available 24 hours/day Type of Home: House Home Access: Stairs to enter [employer] of Steps: 4 Entrance Stairs-Rails: None Home Layout: Two level;Able to live on main level with bedroom/bathroom Alternate Level Stairs-Number of Steps: 13 Alternate Level Stairs-Rails: Can reach  both Bathroom Shower/Tub: Walk-in shower;Door [employer] Toilet: Administrator  Accessibility: Yes How Accessible: Accessible via walker Home Adaptive Equipment: Shower chair with back;Walker - rolling;Straight cane Prior Function Level of Independence: Independent Able to Take Stairs?: Yes Driving: Yes Vocation: Retired Musician: Expressive difficulties Dominant Hand: Right    Cognition  Overall Cognitive Status: Difficult to assess Difficult to assess due to: Level of arousal;Impaired communication Arousal/Alertness: Lethargic Orientation Level: Other (comment) (unable to determine) Behavior During Session: Lethargic Cognition - Other Comments: pt aphasic and difficult to tell if any cognitive deficits    Extremity/Trunk Assessment Right Lower Extremity Assessment RLE ROM/Strength/Tone: Within functional levels Left Lower Extremity Assessment LLE ROM/Strength/Tone: Within functional levels   Balance    End of Session PT - End of Session Equipment Utilized During Treatment: Gait belt;Oxygen Activity Tolerance: Patient limited by fatigue;Treatment limited secondary to agitation Patient left: in bed;with call bell/phone within reach;with family/visitor present;with nursing in room Nurse Communication: Mobility status   Milana Kidney 02/26/2012, 4:29 PM  02/26/2012 Milana Kidney DPT PAGER: [phone] OFFICE: [phone]

## 2012-02-26 NOTE — Evaluation (Signed)
Clinical/Bedside Swallow Evaluation Patient Details  Name: Alicia Crawford MRN: [id number] Date of Birth: [birth year]  Today's Date: 02/26/2012 Time: 1115-1200 SLP Time Calculation (min): 45 min  Past Medical History:  Past Medical History  Diagnosis Date  . Myocardial infarction   . Hypertension   . Leg pain   . Anoxic brain injury 08/2010    "w/cardiac arrest"  . Rotator cuff tendonitis   . Complication of anesthesia     [place]  . High cholesterol   . Cardiac arrest 08/04/2010  . Type II diabetes mellitus   . Blood transfusion 09/2010  . Depression   . DEMENTIA   . Coronary artery disease    Past Surgical History:  Past Surgical History  Procedure Date  . Ovarian cyst removal 1983  . Stomach and skin tuck 2000  . Posterior fusion lumbar spine ` 2000  . Cholecystectomy ~ 1980's  . 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 Cardiac surgery   . Back surgery    HPI:   56 year old female admitted to ED s/p fall.  CT showed a large left subdural hematoma and neurosurgical evaluation was requested. S/p craniotomy hematoma evacuation subdural L. Patient referred for BSE to assess risk for aspiration and recommend safest, possible PO diet.  No prior reports of dysphagia.  Assessment / Plan / Recommendation Clinical Impression  Evaluation limited secondary to fluctuating LOA and s/s of nausea. Per family member patient presented with emesis this am and was givern medication for nausea. Pt. transferred to recliner by PT prior to BSE. Patient with eyes closed during evaluation but followed simple one step directions with tactile cues.  No s/s of aspiration noted with thin liquid and puree trials. Oropharyngeal swallow WFL for trials administered. Confirmed with RN patient's swallow deemed safe for thin liquid with aspiration precautions as diet ordered is due to reported nausea. . ST to follow for safe  diet advancement to solids as unable to assess swallow with regular  solids this date.     Aspiration Risk  Moderate due to LOA and mentation    Diet Recommendation Thin liquid and dysphagia 1 as tolerated   Other  Recommendations   Full supervision with all meals due to cognition  Follow Up Recommendations    CIR consult   Frequency and Duration min 3x week  2 weeks       SLP Swallow Goals Patient will consume recommended diet without observed clinical signs of aspiration with: Minimal assistance Patient will utilize recommended strategies during swallow to increase swallowing safety with: Minimal assistance   Swallow Study Prior Functional Status  Type of Home: House Lives With: Spouse Available Help at Discharge: Family;Available 24 hours/day Vocation: Retired    General Date of Onset: 02/25/12 Type of Study: Bedside swallow evaluation Previous Swallow Assessment: N/a Diet Prior to this Study: NPO Temperature Spikes Noted: No Respiratory Status: Room air History of Intubation: No Behavior/Cognition: Lethargic;Distractible;Requires cueing Oral Cavity - Dentition: Adequate natural dentition Patient Positioning: Upright in chair Baseline Vocal Quality: Clear Volitional Cough: Cognitively unable to elicit Volitional Swallow: Unable to elicit    Oral/Motor/Sensory Function Overall Oral Motor/Sensory Function: Appears within functional limits for tasks assessed   Ice Chips Ice chips: Within functional limits Presentation: Spoon   Thin Liquid Thin Liquid: Within functional limits Presentation: Cup;Spoon    Nectar Thick Nectar Thick Liquid: Not tested   Honey Thick Honey Thick Liquid: Not tested   Puree Puree: Within functional limits Presentation: Spoon  Solid Solid: Not tested    Moreen Fowler MS, CCC-SLP [phone] [facility] 02/26/2012,1:27 PM

## 2012-02-26 NOTE — ED Provider Notes (Signed)
I saw and evaluated the patient, reviewed the resident's note and I agree with the findings and plan.   .Face to face Exam:  General:  Awake Resp:  Normal effort Abd:  Nondistended Neuro:No focal weakness Lymph: No adenopathy   CRITICAL CARE Performed by: Nelva Nay L   Total critical care time: 30 min  Critical care time was exclusive of separately billable procedures and treating other patients.  Critical care was necessary to treat or prevent imminent or life-threatening deterioration.  Critical care was time spent personally by me on the following activities: development of treatment plan with patient and/or surrogate as well as nursing, discussions with consultants, evaluation of patient's response to treatment, examination of patient, obtaining history from patient or surrogate, ordering and performing treatments and interventions, ordering and review of laboratory studies, ordering and review of radiographic studies, pulse oximetry and re-evaluation of patient's condition.   Nelia Shi, MD 02/26/12 1016

## 2012-02-26 NOTE — Progress Notes (Signed)
Patient ID: Alicia Crawford, female   DOB: 12/16/1955, 56 y.o.   MRN: [id number] Subjective: Patient appears to be aphasic, but follows the occasional simple command and answer some yes no questions, and states raises like "thank you." Does say she has some headache.  Objective: Vital signs in last 24 hours: Temp:  [96.2 F (35.7 C)-97.7 F (36.5 C)] 97.7 F (36.5 C) (05/25 2235) Pulse Rate:  [62-126] 108  (05/26 0700) Resp:  [0-36] 20  (05/26 0700) BP: (79-173)/(23-86) 132/51 mmHg (05/26 0145) SpO2:  [76 %-100 %] 97 % (05/26 0700) Arterial Line BP: (129-182)/(46-69) 175/69 mmHg (05/26 0700) Weight:  [95.5 kg (210 lb 8.6 oz)] 95.5 kg (210 lb 8.6 oz) (05/25 2235)  Intake/Output from previous day: 05/25 0701 - 05/26 0700 In: 1865 [I.V.:1800] Out: 1725 [Urine:1245; Drains:380; Blood:100] Intake/Output this shift:    Neurologic exam: patient has some aphasia with difficulty with repeating, comprehension, and fluency. She does understand some speech, she regards the examiner, and follow some simple commands especially she has visual cues. He was to move all extremities well, awake and alert, smiles easily, no facial symmetry, gaze conjugate, pupils equal. Dressing dry  Lab Results: Lab Results  Component Value Date   WBC 9.7 02/25/2012   HGB 11.7* 02/25/2012   HCT 34.6* 02/25/2012   MCV 91.3 02/25/2012   PLT 234 02/25/2012   Lab Results  Component Value Date   INR 0.98 02/25/2012   BMET Lab Results  Component Value Date   NA 140 02/25/2012   K 4.4 02/25/2012   CL 105 02/25/2012   CO2 26 02/25/2012   GLUCOSE 152* 02/25/2012   BUN 17 02/25/2012   CREATININE 0.56 02/25/2012   CALCIUM 9.0 02/25/2012    Studies/Results: Ct Head Wo Contrast  02/26/2012  *RADIOLOGY REPORT*  Clinical Data: Follow up subdural hematoma  CT HEAD WITHOUT CONTRAST  Technique:  Contiguous axial images were obtained from the base of the skull through the vertex without contrast.  Comparison: 02/25/2012  Findings:  Motion degraded images.  Interval left frontal craniotomy with evacuation of subdural hematoma and placement of a surgical drain.  Associated gas overlying the left frontal convexity.  1.6 x 2.7 cm parenchymal hemorrhage/contusion in the left temporal lobe (series 3/image 11), new.  Surrounding vasogenic edema.  New diffuse subarachnoid hemorrhage, most prominent in the left sylvian fissure.  Small amount of intraventricular hemorrhage is suspected in the occipital horn of the left lateral ventricle (series 3/image 14). Ventricles are mildly prominent.  Near complete resolution of prior rightward midline shift, now measuring 2 mm.  The visualized paranasal sinuses are essentially clear. The mastoid air cells are unopacified.  No evidence of calvarial fracture.  IMPRESSION: Interval left frontal craniotomy with evacuation of subdural hematoma and placement of a surgical drain.  Near complete resolution of prior rightward midline shift, now measuring 2 mm.  1.6 x 2.7 cm parenchymal hemorrhage/contusion in the left temporal lobe, new.  Surrounding vasogenic edema.  New diffuse subarachnoid hemorrhage.  Suspected small amount of intraventricular hemorrhage.  These results were called by telephone on 02/26/2012  at  0725 hours to  Ferman Hamming, the nurse caring for the patient, who verbally acknowledged these results.  Original Report Authenticated By: Charline Bills, M.D.   Ct Head Wo Contrast  02/25/2012  *RADIOLOGY REPORT*  Clinical Data: Fall, hit back of head, headaches  CT HEAD WITHOUT CONTRAST  Technique:  Contiguous axial images were obtained from the base of the skull through the vertex  without contrast.  Comparison: None.  Findings: Subdural hematoma overlying the left frontal convexity measuring up to 1.8 cm in thickness (series 2/image 18). Hemorrhage overlies the temporal lobe (series 2/image 7) and is also along the left falx (series 2/image 22).  Underlying mass effect with sulcal effacement.  10  mm rightward midline shift (series 2/image 12).  Mass effect on the posterior and temporal horns of the left lateral ventricle.  The basal cisterns remain patent.  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 Intracranial atherosclerosis.  Cerebral volume is age appropriate.  No ventriculomegaly.  No intraventricular hemorrhage.  The visualized paranasal sinuses are essentially clear. The mastoid air cells are unopacified.  No evidence of calvarial fracture.  IMPRESSION: Subdural hematoma overlying the left frontal convexity measuring up to 1.8 cm in thickness, as described above.  Underlying mass effect with sulcal effacement and 10 mm rightward midline shift.  The basal cisterns remain patent.  Critical Value/emergent results were called by telephone at the time of interpretation on 02/25/2012  at 1730 hours  to  Dr. Radford Pax, who verbally acknowledged these results.  Original Report Authenticated By: Charline Bills, M.D.    Assessment/Plan: Unfortunately she has evidence of a left posterior temporal hemorrhagic contusion with some surrounding hypodensity it out significant mass effect or shift which is blossomed after her craniotomy for a large left subdural hematoma. Even has some subarachnoid hemorrhage on the right-hand side now, scattered subarachnoid hemorrhage on the left-hand side. The large subdural hematoma is now gone and a drain is in place. Hopefully her aphasia will improve as the contusion improves. Otherwise she seems to be doing quite well in regards to her mental status and her motor exam.   LOS: 1 day    Alicia Crawford 02/26/2012, 7:59 AM

## 2012-02-27 ENCOUNTER — Inpatient Hospital Stay (HOSPITAL_COMMUNITY): Payer: PRIVATE HEALTH INSURANCE

## 2012-02-27 DIAGNOSIS — I10 Essential (primary) hypertension: Secondary | ICD-10-CM

## 2012-02-27 DIAGNOSIS — I6200 Nontraumatic subdural hemorrhage, unspecified: Secondary | ICD-10-CM

## 2012-02-27 DIAGNOSIS — E119 Type 2 diabetes mellitus without complications: Secondary | ICD-10-CM | POA: Diagnosis present

## 2012-02-27 DIAGNOSIS — R51 Headache: Secondary | ICD-10-CM

## 2012-02-27 DIAGNOSIS — I498 Other specified cardiac arrhythmias: Secondary | ICD-10-CM

## 2012-02-27 DIAGNOSIS — Z9889 Other specified postprocedural states: Secondary | ICD-10-CM

## 2012-02-27 DIAGNOSIS — F419 Anxiety disorder, unspecified: Secondary | ICD-10-CM | POA: Diagnosis present

## 2012-02-27 DIAGNOSIS — E669 Obesity, unspecified: Secondary | ICD-10-CM | POA: Diagnosis present

## 2012-02-27 DIAGNOSIS — R001 Bradycardia, unspecified: Secondary | ICD-10-CM | POA: Diagnosis present

## 2012-02-27 DIAGNOSIS — R4182 Altered mental status, unspecified: Secondary | ICD-10-CM

## 2012-02-27 DIAGNOSIS — R1110 Vomiting, unspecified: Secondary | ICD-10-CM

## 2012-02-27 DIAGNOSIS — I2510 Atherosclerotic heart disease of native coronary artery without angina pectoris: Secondary | ICD-10-CM | POA: Diagnosis present

## 2012-02-27 LAB — GLUCOSE, CAPILLARY
Glucose-Capillary: 205 mg/dL — ABNORMAL HIGH (ref 70–99)
Glucose-Capillary: 138 mg/dL — ABNORMAL HIGH (ref 70–99)
Glucose-Capillary: 149 mg/dL — ABNORMAL HIGH (ref 70–99)
Glucose-Capillary: 116 mg/dL — ABNORMAL HIGH (ref 70–99)

## 2012-02-27 LAB — BLOOD GAS, ARTERIAL
Acid-Base Excess: 0.9 mmol/L (ref 0.0–2.0)
Drawn by: 225631
FIO2: 0.5 %
O2 Saturation: 98.3 %
TCO2: 27.8 mmol/L (ref 0–100)
pCO2 arterial: 51.5 mmHg — ABNORMAL HIGH (ref 35.0–45.0)

## 2012-02-27 LAB — BASIC METABOLIC PANEL
BUN: 15 mg/dL (ref 6–23)
Calcium: 8.7 mg/dL (ref 8.4–10.5)
Creatinine, Ser: 0.52 mg/dL (ref 0.50–1.10)
GFR calc Af Amer: >90 mL/min (ref >90)

## 2012-02-27 LAB — CARDIAC PANEL(CRET KIN+CKTOT+MB+TROPI)
CK, MB: 3.5 ng/mL (ref 0.3–4.0)
Relative Index: 2 (ref 0.0–2.5)
Total CK: 174 U/L (ref 7–177)
Troponin I: <0.3 ng/mL (ref <0.30)

## 2012-02-27 LAB — PRO B NATRIURETIC PEPTIDE: Pro B Natriuretic peptide (BNP): 1016 pg/mL — ABNORMAL HIGH (ref 0–125)

## 2012-02-27 MED ORDER — LORAZEPAM 2 MG/ML IJ SOLN
1.0000 mg | Freq: Once | INTRAMUSCULAR | Status: AC
  Administered 2012-02-27: 1 mg via INTRAVENOUS

## 2012-02-27 MED ORDER — LORAZEPAM 2 MG/ML IJ SOLN: 0.5000 mg | Freq: Four times a day (QID) | INTRAMUSCULAR | Status: DC | PRN

## 2012-02-27 MED ORDER — INSULIN ASPART 100 UNIT/ML ~~LOC~~ SOLN
0.0000 [IU] | SUBCUTANEOUS | Status: DC
  Administered 2012-02-27 – 2012-02-28 (×2): 1 [IU] via SUBCUTANEOUS
  Administered 2012-02-28 (×3): 3 [IU] via SUBCUTANEOUS
  Administered 2012-02-28: 1 [IU] via SUBCUTANEOUS
  Administered 2012-02-29: 3 [IU] via SUBCUTANEOUS
  Administered 2012-02-29 – 2012-03-01 (×6): 1 [IU] via SUBCUTANEOUS
  Administered 2012-03-01 (×2): 3 [IU] via SUBCUTANEOUS

## 2012-02-27 MED ORDER — LORAZEPAM 2 MG/ML IJ SOLN
INTRAMUSCULAR | Status: AC
  Filled 2012-02-27: qty 1

## 2012-02-27 MED ORDER — HALOPERIDOL LACTATE 5 MG/ML IJ SOLN
1.0000 mg | INTRAMUSCULAR | Status: DC | PRN
  Administered 2012-02-27 – 2012-02-28 (×3): 2 mg via INTRAVENOUS
  Administered 2012-02-28 – 2012-02-29 (×3): 3 mg via INTRAVENOUS
  Filled 2012-02-27 (×7): qty 1

## 2012-02-27 MED ORDER — DEXTROSE 10 % IV SOLN: INTRAVENOUS | Status: DC | PRN

## 2012-02-27 NOTE — Progress Notes (Signed)
Patient ID: Alicia Crawford, female   DOB: 1956-01-18, 56 y.o.   MRN: [id number] Patient is easily arousable, but still aphasic with poor repetition,  Fluency, and comprehension. Does not follow commands but smiles and says " thank you" and "okay." Dressing dry. Moves all extremities. Pupils equal and reactive and no facial asymmetry. Continue PT/OT/ST. Head CT tomorrow.  Neurologic exam stable. Hopefully her aphasia will improve as her contusion improves.

## 2012-02-27 NOTE — Progress Notes (Signed)
Nursing 1455 Patient agitated, moving about bed, moaning/crying out, not consolable.  RN and NT at bedside to console patient. Haldol 2mg  IV given at 1503.  QTC checked prior to Haldol administration, QTC 0.44.  Patient reassessed at 1515, patient is restless at times with occasional moaning, will continue to assess.

## 2012-02-27 NOTE — Progress Notes (Signed)
Nursing 1100 Patient had bradycardia as low as 33 with a 1.77 sec pause.  ABG results back. Brett Canales Minor, NP for CCM paged and notified of bradycardia and ABG results.  Will continue to hold all medications that can cause bradycardia. NP requested that RN page cardiology.  Dr. Allyson Sabal for cardiology paged and consulted, RN made MD aware of pt's situation.  MD to see patient.

## 2012-02-27 NOTE — Progress Notes (Signed)
Nursing [phone] Patient continuing to have agitation, calling out, moaning and not consolable. Dr. Yetta Barre paged and made aware that Morphine 2mg  IV did not provide relief.  Per Dr. Yetta Barre, hold additional Morphine IV and get STAT cardiac enzymes and BMET and STAT 12 lead EKG.  Orders placed.  RN to bedside, husband now at bedside.  RN updated patient's husband, husband reports that patient has had anxiety issues during previous hospitalizations.  Dr. Yetta Barre now at bedside to assess patient. Order to give Ativan 1mg  IV now.  Ativan dose given at 0929 with Dr. Yetta Barre at bedside.  Patient continuing to drop HR in the 30s. CCM consulted.  Patient's O2 saturations dropped to 84% after Ativan dose, oxygen increased to 6L [place].  Oxygen did not increase so a venturi mask placed at 50%.  Oxygen saturations slowly increased to 97%.  EKG completed, Dr. Yetta Barre aware, labs drawn.  Brett Canales Minor, NP for CCM assessed patient, will get ABG at 1020 to assess respiratory status.  Will continue to assess patient.

## 2012-02-27 NOTE — Progress Notes (Signed)
02/27/12  11:00 PT Note: Pt agitated this am and given Ativan.  Now currently very lethargic and on venturi mask.  Will hold PT today and follow along.  02/27/2012 Cephus Shelling, PT, DPT [phone]

## 2012-02-27 NOTE — Progress Notes (Signed)
Nursing 1945  Patient experiencing agitation, sitter and family at bedside to console. RN assessed patient and patient having expressive aphasia, will allow continue to assess and allow patient time to calm down.  Patient reassessed and continuing to have agitation without resolve.  Patient's HR increased to 110s and RR increased to 40s.  RN gave Haldol 2mg  IV at 1924 for agitation.  Patient reassessed and found to be calm and HR and RR decreased to baseline.

## 2012-02-27 NOTE — Progress Notes (Signed)
Nursing 0900 Patient agitated and moaning, calling out, sitting up in bed, not consolable. Dr. Yetta Barre made aware that patient's HR has been dropping to the 30s.  Patient has also been lethargic and not responding with speech. Ok to give Morphine IV for pain and hold beta blockers at this time.

## 2012-02-27 NOTE — Progress Notes (Signed)
SLP Cancellation Note  Treatment cancelled today due to medical issues with patient which prohibited therapy.  Attempted swallowing treatment and cognitive-linguistic evaluation earlier this am, patient lethargic with minimal focused attention. Patient now alert, moaning. RN in room, recommended holding treatment for today. Will f/u 5/28.   Ferdinand Lango MA, CCC-SLP [phone]   Jenasia Dolinar Meryl 02/27/2012, 9:10 AM

## 2012-02-27 NOTE — Progress Notes (Signed)
OT Cancellation Note  Treatment cancelled today pt very lethargic.  Pt agitated earlier this AM and given Ativan.  Pt also with bradycardia this AM.  Will hold OT today and re-attempt as appropriate.  02/27/2012 Cipriano Mile OTR/L Pager [phone] Office [phone]

## 2012-02-27 NOTE — Consult Note (Signed)
Name: Alicia Crawford MRN: [id number] DOB: 11-10-1955    LOS: 2  Referring Provider:  Yetta Barre Reason for Referral:  Agitation, bradycardia, post Lt craniotomy 5/25 for traumatic Lt SDH.   PULMONARY / CRITICAL CARE MEDICINE  HPI:  56 yo remote smoker with known anxiety disorder who fell 5/24 , subsequently developed headache and was transported to ED and found to have Left SDH. Post craniotomy she developed agitation and delirium(as she does with every hospital admit). Treatment with benzodiazepines created somnolence,  Hypoventilation and bradycardia. PCCM was asked to consult on 5/27.   Past Medical History  Diagnosis Date  . Myocardial infarction   . Hypertension   . Leg pain   . Anoxic brain injury 08/2010    "w/cardiac arrest"  . Rotator cuff tendonitis   . Complication of anesthesia     [place]  . High cholesterol   . Cardiac arrest 08/04/2010  . Type II diabetes mellitus   . Blood transfusion 09/2010  . Depression   . DEMENTIA   . Coronary artery disease    Past Surgical History  Procedure Date  . Ovarian cyst removal 1983  . Stomach and skin tuck 2000  . Posterior fusion lumbar spine ` 2000  . Cholecystectomy ~ 1980's  . 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 Cardiac surgery   . Back surgery    Prior to Admission medications   Medication Sig Start Date End Date Taking? Authorizing Provider  ACETYLCARNITINE HCL PO Take 1 capsule by mouth daily.   Yes Historical Provider, MD  aspirin EC 325 MG tablet Take 162.5 mg by mouth daily. TAKE 1/2 DAILY   Yes Historical Provider, MD  atorvastatin (LIPITOR) 80 MG tablet Take 80 mg by mouth daily.   Yes Historical Provider, MD  cilostazol (PLETAL) 100 MG tablet Take 100 mg by mouth 2 (two) times daily.   Yes Historical Provider, MD  Coenzyme Q10 (EQL COQ10) 300 MG CAPS Take 1 capsule by mouth daily.   Yes Historical Provider, MD  Ibuprofen-Diphenhydramine Cit (IBUPROFEN PM PO) Take 2 tablets by mouth at bedtime.    Yes Historical Provider, MD  LECITHIN PO Take 1,200 mg by mouth daily.   Yes Historical Provider, MD  LORazepam (ATIVAN) 1 MG tablet Take 1 mg by mouth at bedtime.    Yes Historical Provider, MD  losartan (COZAAR) 50 MG tablet Take 50 mg by mouth daily.     Yes Historical Provider, MD  metFORMIN (GLUCOPHAGE) 500 MG tablet Take 500 mg by mouth daily.   Yes Historical Provider, MD  metoprolol tartrate (LOPRESSOR) 25 MG tablet Take 37.5 mg by mouth 2 (two) times daily.   Yes Historical Provider, MD  Multiple Vitamin (MULITIVITAMIN WITH MINERALS) TABS Take 1 tablet by mouth daily.   Yes Historical Provider, MD  OVER THE COUNTER MEDICATION Take 1 capsule by mouth daily. Mega Red   Yes Historical Provider, MD  pantoprazole (PROTONIX) 40 MG tablet Take 40 mg by mouth daily.     Yes Historical Provider, MD  polycarbophil (FIBERCON) 625 MG tablet Take 625 mg by mouth daily.   Yes Historical Provider, MD  sertraline (ZOLOFT) 50 MG tablet Take 150 mg by mouth daily.   Yes Historical Provider, MD   Allergies Allergies  Allergen Reactions  . [place] Hives and Other (See Comments)    Gas used 1980's Reaction: affected her liver    Family History Family History  Problem Relation Age of Onset  .  Heart disease Other   . Diabetes Other   . Hypertension Other   . Alcohol abuse Other   . Mental illness Other   . Heart disease Mother   . Heart disease Father   . Heart disease Sister    Social History  reports that she quit smoking about 6 years ago. Her smoking use included Cigarettes. She has a 30 pack-year smoking history. She has never used smokeless tobacco. She reports that she drinks alcohol. She reports that she does not use illicit drugs.  Review Of Systems:  NA  Brief patient description:  56 yo wf sedated.  Events Since Admission: 5/25 lt crani for sdh. 5/27 Agitation and delirium..   Current Status: Sedated but arousable. Vital Signs: Temp:  [98.5 F (36.9 C)-99.4 F (37.4 C)]  98.5 F (36.9 C) (05/27 1200) Pulse Rate:  [51-102] 56  (05/27 1200) Resp:  [7-34] 19  (05/27 1200) BP: (110-178)/(44-89) 124/57 mmHg (05/27 1200) SpO2:  [88 %-100 %] 99 % (05/27 1200) FiO2 (%):  [40 %-50 %] 40 % (05/27 1200) Weight:  [201 lb 11.5 oz (91.5 kg)] 201 lb 11.5 oz (91.5 kg) (05/27 0400)  Physical Examination: General:  WDWNWF sedated Neuro:  Arouses to voice. Agitated and confused. MAEx4 HEENT:  Lt crani dressing intact. Hemovac with bloody drainge. Neck:  No jvd Cardiovascular:  hsr sb Lungs:  Decreased bs thru out Abdomen:  soft Musculoskeletal:  intact Skin:  Intact as noted to kt head   Active Problems:  Subdural hematoma  S/P craniotomy  HLD (hyperlipidemia)  Peripheral artery disease  HTN (hypertension)  Renal artery stenosis, Right   ASSESSMENT AND PLAN  PULMONARY  Lab 02/27/12 1042  PHART 7.327*  PCO2ART 51.5*  PO2ART 110.0*  HCO3 26.2*  O2SAT 98.3   Ventilator Settings: Vent Mode:  [-]  FiO2 (%):  [40 %-50 %] 40 % CXR:  none ETT:  none  A:  Hypoventilation secondary to oversedation and post crani. P:   -O2 as needed -dc benzo's -use haldol if needede  CARDIOVASCULAR  Lab 02/27/12 1026  TROPONINI <0.30  LATICACIDVEN --  PROBNP --   ECG: sb Lines: lt hemovac   A: SB P:  -dc bb -dc sedation -cards consult caleld 5/27 dr Allyson Sabal  NEUROLOGIC  A:  Post Lt craniotomy for traumatic SDH  P:   -PER ns   RENAL  Lab 02/27/12 1027 02/25/12 1730  NA 142 140  K 3.5 4.4  CL 107 105  CO2 26 26  BUN 15 17  CREATININE 0.52 0.56  CALCIUM 8.7 9.0  MG -- --  PHOS -- --   Intake/Output      05/26 0701 - 05/27 0700 05/27 0701 - 05/28 0700   P.O. 180    I.V. (mL/kg) 825 (9) 375 (4.1)   Other     IV Piggyback 109 105   Total Intake(mL/kg) 1114 (12.2) 480 (5.2)   Urine (mL/kg/hr) 2410 (1.1) 211 (0.4)   Drains 375 90   Blood     Total Output 2785 301   Net -1671 +179        Emesis Occurrence 2 x     Foley:  5/25  A:  HX  OF RENAL STENOSIS P:   -MONITOR RENAL FUNCTION  GASTROINTESTINAL No results found for this basename: AST:5,ALT:5,ALKPHOS:5,BILITOT:5,PROT:5,ALBUMIN:5 in the last 168 hours  A:  Gi PROTECTION P:   -PPI  HEMATOLOGIC  Lab 02/25/12 1730  HGB 11.7*  HCT 34.6*  PLT 234  INR 0.98  APTT --   A:  NONE P:  -MONITOR CBC  INFECTIOUS  Lab 02/25/12 1730  WBC 9.7  PROCALCITON --   Cultures: NONE Antibiotics: NONE  A:  NO ISSUE P:     ENDOCRINE  Lab 02/26/12 1150 02/26/12 0801 02/25/12 2130 02/25/12 1732  GLUCAP 191* 218* 192* 137*   A:  STEROID INDUCED HYPERGYCEMIA   P:   -SSI    BEST PRACTICE / DISPOSITION - Level of Care:  icu - Primary Service:  ns - Consultants:  SVC cards, pccm - Code Status:  full - Diet:  liquid - DVT Px:  pas - GI Px:  ppi - Skin Integrity:  Intact except lt sdh /crani incision. - Social / Family: updated at bedside 5/27. sm  Brett Canales Minor ACNP Adolph Pollack PCCM Pager [phone] till 3 pm If no answer page [phone] 02/27/2012, 12:54 PM  Bradycardia due to beta blockers and sedation, at this point would hold beta blockers watching for rebound tachycardia and minimize sedation.  Patient now appears comfortable and HR is more stable.  Would hold in the ICU for monitoring.  Patient seen and examined, agree with above note.  I dictated the care and orders written for this patient under my direction.  Koren Bound, M.D. [phone]

## 2012-02-27 NOTE — Consult Note (Signed)
Reason for Consult: bradycardia  Requesting Physician: CCM  HPI: This is a 56 y.o. female with a past medical history significant for CAD. She had a cardiac arrest with CPR and ultimately CABG while in [facility] in December of 2011. She was apparently intubated for 2 weeks and spent some time in rehab. Dr Allyson Sabal saw her in April of this year for claudication. Echo and Myoview done in our office were reportedly unremarkable, office records pending. PVA done April this year revealed bilat femoral artery disease as well as 75% Rt RAS. Dr Allyson Sabal was going to get an opinion from Dr Myra Gianotti as an out patient  Before making final recommendations for treatment. The patient was admitted 02/25/12 after a fall which resulted in a SDH requiring surgery later that day. Postop she developed delirium and agitation followed by somnolence after benzo diazepam administration. She also was noted to have episodic bradycardia and pauses up to 1.7 seconds. Her HR did drop into to the 30s briefly but recover quickly. Dr Yetta Barre notes that the patient has had Lt posterior bleeding after her craniotomy.   PMHx:  Past Medical History  Diagnosis Date  . Myocardial infarction   . Hypertension   . Leg pain   . Anoxic brain injury 08/2010    "w/cardiac arrest"  . Rotator cuff tendonitis   . Complication of anesthesia     [place]  . High cholesterol   . Cardiac arrest 08/04/2010  . Type II diabetes mellitus   . Blood transfusion 09/2010  . Depression   . DEMENTIA   . Coronary artery disease    Past Surgical History  Procedure Date  . Ovarian cyst removal 1983  . Stomach and skin tuck 2000  . Posterior fusion lumbar spine ` 2000  . Cholecystectomy ~ 1980's  . 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 Cardiac surgery   . Back surgery     FAMHx: Family History  Problem Relation Age of Onset  . Heart disease Other   . Diabetes Other   . Hypertension Other   . Alcohol abuse Other   . Mental illness  Other   . Heart disease Mother   . Heart disease Father   . Heart disease Sister     SOCHx:  reports that she quit smoking about 6 years ago. Her smoking use included Cigarettes. She has a 30 pack-year smoking history. She has never used smokeless tobacco. She reports that she drinks alcohol. She reports that she does not use illicit drugs.  ALLERGIES: Allergies  Allergen Reactions  . [place] Hives and Other (See Comments)    Gas used 1980's Reaction: affected her liver    ROS: Review of systems not obtained due to patient factors.  HOME MEDICATIONS: Prescriptions prior to admission  Medication Sig Dispense Refill  . ACETYLCARNITINE HCL PO Take 1 capsule by mouth daily.      [name] aspirin EC 325 MG tablet Take 162.5 mg by mouth daily. TAKE 1/2 DAILY      . atorvastatin (LIPITOR) 80 MG tablet Take 80 mg by mouth daily.      . cilostazol (PLETAL) 100 MG tablet Take 100 mg by mouth 2 (two) times daily.      . Coenzyme Q10 (EQL COQ10) 300 MG CAPS Take 1 capsule by mouth daily.      . Ibuprofen-Diphenhydramine Cit (IBUPROFEN PM PO) Take 2 tablets by mouth at bedtime.      [name] LECITHIN PO Take  1,200 mg by mouth daily.      [name] LORazepam (ATIVAN) 1 MG tablet Take 1 mg by mouth at bedtime.       [name] losartan (COZAAR) 50 MG tablet Take 50 mg by mouth daily.        . metFORMIN (GLUCOPHAGE) 500 MG tablet Take 500 mg by mouth daily.      . metoprolol tartrate (LOPRESSOR) 25 MG tablet Take 37.5 mg by mouth 2 (two) times daily.      . Multiple Vitamin (MULITIVITAMIN WITH MINERALS) TABS Take 1 tablet by mouth daily.      [name] OVER THE COUNTER MEDICATION Take 1 capsule by mouth daily. Mega Red      . pantoprazole (PROTONIX) 40 MG tablet Take 40 mg by mouth daily.        . polycarbophil (FIBERCON) 625 MG tablet Take 625 mg by mouth daily.      . sertraline (ZOLOFT) 50 MG tablet Take 150 mg by mouth daily.        HOSPITAL MEDICATIONS: I have reviewed the patient's current medications.  VITALS: Blood  pressure 117/49, pulse 78, temperature 98.5 F (36.9 C), temperature source Axillary, resp. rate 32, height 5\' 7"  (1.702 m), weight 91.5 kg (201 lb 11.5 oz), SpO2 100.00%.  PHYSICAL EXAM: General appearance: sedated Neck: no carotid bruit Lungs: decreased breath sounds Heart: regular rate and rhythm Abdomen: obese, midline surgical scar Extremities: no edema Pulses: diminnished Skin: cool and dry Neurologic: sedated, she did not respond to verbal stimulation. Cranial drain in place. Rn reports she has been very agitated at times.  LABS: Results for orders placed during the hospital encounter of 02/25/12 (from the past 48 hour(s))  CBC     Status: Abnormal   Collection Time   02/25/12  5:30 PM      Component Value Range Comment   WBC 9.7  4.0 - 10.5 (K/uL)    RBC 3.79 (*) 3.87 - 5.11 (MIL/uL)    Hemoglobin 11.7 (*) 12.0 - 15.0 (g/dL)    HCT 47.8 (*) 29.5 - 46.0 (%)    MCV 91.3  78.0 - 100.0 (fL)    MCH 30.9  26.0 - 34.0 (pg)    MCHC 33.8  30.0 - 36.0 (g/dL)    RDW 62.1  30.8 - 65.7 (%)    Platelets 234  150 - 400 (K/uL)   PROTIME-INR     Status: Normal   Collection Time   02/25/12  5:30 PM      Component Value Range Comment   Prothrombin Time 13.2  11.6 - 15.2 (seconds)    INR 0.98  0.00 - 1.49    BASIC METABOLIC PANEL     Status: Abnormal   Collection Time   02/25/12  5:30 PM      Component Value Range Comment   Sodium 140  135 - 145 (mEq/L)    Potassium 4.4  3.5 - 5.1 (mEq/L)    Chloride 105  96 - 112 (mEq/L)    CO2 26  19 - 32 (mEq/L)    Glucose, Bld 152 (*) 70 - 99 (mg/dL)    BUN 17  6 - 23 (mg/dL)    Creatinine, Ser 8.46  0.50 - 1.10 (mg/dL)    Calcium 9.0  8.4 - 10.5 (mg/dL)    GFR calc non Af Amer >90  >90 (mL/min)    GFR calc Af Amer >90  >90 (mL/min)   GLUCOSE, CAPILLARY     Status: Abnormal  Collection Time   02/25/12  5:32 PM      Component Value Range Comment   Glucose-Capillary 137 (*) 70 - 99 (mg/dL)   TYPE AND SCREEN     Status: Normal   Collection  Time   02/25/12  5:40 PM      Component Value Range Comment   ABO/RH(D) O POS      Antibody Screen NEG      Sample Expiration 02/28/2012     ABO/RH     Status: Normal   Collection Time   02/25/12  5:40 PM      Component Value Range Comment   ABO/RH(D) O POS     PLATELET FUNCTION ASSAY     Status: Normal   Collection Time   02/25/12  5:58 PM      Component Value Range Comment   Collagen / Epinephrine 159  0 - 184 (seconds) CALLED TO JOEL MCCAIN 02/25/12 21:40  MEYERST   PFA Interpretation (NOTE)     GLUCOSE, CAPILLARY     Status: Abnormal   Collection Time   02/25/12  9:30 PM      Component Value Range Comment   Glucose-Capillary 192 (*) 70 - 99 (mg/dL)   MRSA PCR SCREENING     Status: Normal   Collection Time   02/25/12 10:37 PM      Component Value Range Comment   MRSA by PCR NEGATIVE  NEGATIVE    GLUCOSE, CAPILLARY     Status: Abnormal   Collection Time   02/26/12  8:01 AM      Component Value Range Comment   Glucose-Capillary 218 (*) 70 - 99 (mg/dL)    Comment 1 Documented in Chart      Comment 2 Notify RN     GLUCOSE, CAPILLARY     Status: Abnormal   Collection Time   02/26/12 11:50 AM      Component Value Range Comment   Glucose-Capillary 191 (*) 70 - 99 (mg/dL)   CARDIAC PANEL(CRET KIN+CKTOT+MB+TROPI)     Status: Normal   Collection Time   02/27/12 10:26 AM      Component Value Range Comment   Total CK 174  7 - 177 (U/L)    CK, MB 3.5  0.3 - 4.0 (ng/mL)    Troponin I <0.30  <0.30 (ng/mL)    Relative Index 2.0  0.0 - 2.5    BASIC METABOLIC PANEL     Status: Abnormal   Collection Time   02/27/12 10:27 AM      Component Value Range Comment   Sodium 142  135 - 145 (mEq/L)    Potassium 3.5  3.5 - 5.1 (mEq/L)    Chloride 107  96 - 112 (mEq/L)    CO2 26  19 - 32 (mEq/L)    Glucose, Bld 184 (*) 70 - 99 (mg/dL)    BUN 15  6 - 23 (mg/dL)    Creatinine, Ser 6.21  0.50 - 1.10 (mg/dL)    Calcium 8.7  8.4 - 10.5 (mg/dL)    GFR calc non Af Amer >90  >90 (mL/min)    GFR calc  Af Amer >90  >90 (mL/min)   BLOOD GAS, ARTERIAL     Status: Abnormal   Collection Time   02/27/12 10:42 AM      Component Value Range Comment   FIO2 0.50      Delivery systems VENTURI MASK      pH, Arterial 7.327 (*) 7.350 - 7.400  pCO2 arterial 51.5 (*) 35.0 - 45.0 (mmHg)    pO2, Arterial 110.0 (*) 80.0 - 100.0 (mmHg)    Bicarbonate 26.2 (*) 20.0 - 24.0 (mEq/L)    TCO2 27.8  0 - 100 (mmol/L)    Acid-Base Excess 0.9  0.0 - 2.0 (mmol/L)    O2 Saturation 98.3      Patient temperature 98.6      Collection site RADIOLOGY      Drawn by [id number]      Sample type ARTERIAL      Allens test (pass/fail) PASS  PASS    PRO B NATRIURETIC PEPTIDE     Status: Abnormal   Collection Time   02/27/12 12:50 PM      Component Value Range Comment   Pro B Natriuretic peptide (BNP) 1016.0 (*) 0 - 125 (pg/mL)     IMAGING: .    IMPRESSION: Active Problems:  Subdural hematoma  Bradycardia, she has had baseline bradycardia prior to admission  Altered mental status  Peripheral artery disease, bilta LE PVD by angio April 2013  S/P craniotomy, 02/25/12 after fall, SDH  Anxiety, ? residual hypoxic brain inhury from cardiac arrest 2011  HLD (hyperlipidemia)  HTN (hypertension)  Renal artery stenosis, Right, 75%  Obesity  Diabetes mellitus  CAD, cardiac arrest, CPR, CABG in [facility] Dec 2011-NL LVF by Echo, neg Nuc April 2013   RECOMMENDATIONS: Baseline bradycardia worse in setting of brain injury and chronic low dose beta blocker. Dr Allyson Sabal to see. Agree with d/c beta blocker (last dose yesterday am ?). BNP not reliable for CHF in this setting (1016).   Time Spent Directly with Patient: 40 minutes  KILROY,LUKE K 02/27/2012, 4:08 PM   Agree with note written by Corine Shelter PAC  Pt well known to me. H/O CAD S/P CABG in LV 2 years ago. Neg myoview since and nl LV fxn. 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 Pt admitted with SDH, S/P surgical drainage. Noted to be brady with 1.7 sec pauses. CV exam  benign. Cardiac stable. Was on low dose BB now on hold. It's not uncommon to have brady arrhthymias with CNS insults. Will follow with you. Doubt increased BNP clinically relevant.    [name] 02/27/2012 4:57 PM

## 2012-02-28 ENCOUNTER — Encounter (HOSPITAL_COMMUNITY): Payer: Self-pay | Admitting: Neurological Surgery

## 2012-02-28 ENCOUNTER — Inpatient Hospital Stay (HOSPITAL_COMMUNITY): Payer: PRIVATE HEALTH INSURANCE

## 2012-02-28 DIAGNOSIS — E669 Obesity, unspecified: Secondary | ICD-10-CM

## 2012-02-28 LAB — GLUCOSE, CAPILLARY
Glucose-Capillary: 116 mg/dL — ABNORMAL HIGH (ref 70–99)
Glucose-Capillary: 127 mg/dL — ABNORMAL HIGH (ref 70–99)
Glucose-Capillary: 133 mg/dL — ABNORMAL HIGH (ref 70–99)
Glucose-Capillary: 176 mg/dL — ABNORMAL HIGH (ref 70–99)

## 2012-02-28 LAB — CBC
Hemoglobin: 9.4 g/dL — ABNORMAL LOW (ref 12.0–15.0)
MCH: 30.2 pg (ref 26.0–34.0)
Platelets: 208 10*3/uL (ref 150–400)
RBC: 3.11 MIL/uL — ABNORMAL LOW (ref 3.87–5.11)
WBC: 10.3 10*3/uL (ref 4.0–10.5)

## 2012-02-28 LAB — BASIC METABOLIC PANEL
CO2: 27 mEq/L (ref 19–32)
Calcium: 8.2 mg/dL — ABNORMAL LOW (ref 8.4–10.5)
Chloride: 109 mEq/L (ref 96–112)
Glucose, Bld: 124 mg/dL — ABNORMAL HIGH (ref 70–99)
Potassium: 3.5 mEq/L (ref 3.5–5.1)
Sodium: 143 mEq/L (ref 135–145)

## 2012-02-28 LAB — PHOSPHORUS: Phosphorus: 1.9 mg/dL — ABNORMAL LOW (ref 2.3–4.6)

## 2012-02-28 LAB — MAGNESIUM: Magnesium: 2 mg/dL (ref 1.5–2.5)

## 2012-02-28 MED ORDER — HYDRALAZINE HCL 20 MG/ML IJ SOLN
10.0000 mg | Freq: Four times a day (QID) | INTRAMUSCULAR | Status: DC | PRN
  Administered 2012-02-28 – 2012-02-29 (×3): 10 mg via INTRAVENOUS
  Filled 2012-02-28 (×2): qty 1

## 2012-02-28 MED ORDER — HYDRALAZINE HCL 20 MG/ML IJ SOLN
INTRAMUSCULAR | Status: AC
  Administered 2012-02-28: 5 mg via INTRAVENOUS
  Filled 2012-02-28: qty 1

## 2012-02-28 MED ORDER — HYDRALAZINE HCL 20 MG/ML IJ SOLN
5.0000 mg | Freq: Four times a day (QID) | INTRAMUSCULAR | Status: DC | PRN
  Administered 2012-02-28: 5 mg via INTRAVENOUS
  Filled 2012-02-28: qty 1

## 2012-02-28 MED ORDER — PNEUMOCOCCAL VAC POLYVALENT 25 MCG/0.5ML IJ INJ
0.5000 mL | INJECTION | INTRAMUSCULAR | Status: AC
  Filled 2012-02-28: qty 0.5

## 2012-02-28 NOTE — Progress Notes (Signed)
Name: Alicia Crawford MRN: [id number] DOB: Mar 04, 1956    LOS: 3  Referring Provider:  Yetta Barre Reason for Referral:  Agitation, bradycardia, post Lt craniotomy 5/25 for traumatic Lt SDH.   PULMONARY / CRITICAL CARE MEDICINE  HPI:  56 yo remote smoker with known anxiety disorder who fell 5/24, subsequently developed headache and was transported to ED and found to have Left SDH. Post craniotomy she developed agitation and delirium(as she does with every hospital admit). Treatment with benzodiazepines created somnolence,  Hypoventilation and bradycardia. PCCM was asked to consult on 5/27.   Vital Signs: Temp:  [97.6 F (36.4 C)-99.5 F (37.5 C)] 99 F (37.2 C) (05/28 0800) Pulse Rate:  [53-92] 62  (05/28 1000) Resp:  [13-39] 19  (05/28 1000) BP: (108-160)/(43-117) 143/55 mmHg (05/28 1000) SpO2:  [90 %-100 %] 96 % (05/28 1000) FiO2 (%):  [35 %-50 %] 35 % (05/27 1230)  Physical Examination: General:  WDWNWF sedated Neuro:  Arouses to voice. Agitated and confused. MAEx4 HEENT:  Lt crani dressing intact. Hemovac with bloody drainge. Neck:  No jvd Cardiovascular:  hsr sb Lungs:  Decreased bs thru out Abdomen:  soft Musculoskeletal:  intact Skin:  Intact as noted to kt head   Active Problems:  Peripheral artery disease, bilta LE PVD by angio April 2013  HLD (hyperlipidemia)  HTN (hypertension)  Renal artery stenosis, Right, 75%  Subdural hematoma  S/P craniotomy, 02/25/12 after fall, SDH  Bradycardia, she has had baseline bradycardia prior to admission  Altered mental status  Obesity  Diabetes mellitus  CAD, cardiac arrest, CPR, CABG in [facility] Dec 2011-NL LVF by Echo, neg Nuc April 2013  Anxiety, ? residual hypoxic brain inhury from cardiac arrest 2011   ASSESSMENT AND PLAN  PULMONARY  Lab 02/27/12 1042  PHART 7.327*  PCO2ART 51.5*  PO2ART 110.0*  HCO3 26.2*  O2SAT 98.3   Ventilator Settings: Vent Mode:  [-]  FiO2 (%):  [35 %-50 %] 35 % CXR:  none ETT:   none  A:  Hypoventilation secondary to oversedation and post crani. P:   - Titrate down O2. - D/C benzo's. - Use haldol if needed.  CARDIOVASCULAR  Lab 02/27/12 1250 02/27/12 1026  TROPONINI -- <0.30  LATICACIDVEN -- --  PROBNP 1016.0* --   ECG: sb Lines: lt hemovac  A: Sinus Brady stabilizing after beta blockers have been stopped. P:  - D/C Beta blockers. - D/C sedation. - Cards consult appreciated and will defer to cards.  NEUROLOGIC  A:  Post Lt craniotomy for traumatic SDH. P:   - Per NS.  RENAL  Lab 02/28/12 0416 02/27/12 1027 02/25/12 1730  NA 143 142 140  K 3.5 3.5 --  CL 109 107 105  CO2 27 26 26   BUN 13 15 17   CREATININE 0.52 0.52 0.56  CALCIUM 8.2* 8.7 9.0  MG 2.0 -- --  PHOS 1.9* -- --   Intake/Output      05/27 0701 - 05/28 0700 05/28 0701 - 05/29 0700   P.O.     I.V. (mL/kg) 1800 (19.7) 225 (2.5)   IV Piggyback 105    Total Intake(mL/kg) 1905 (20.8) 225 (2.5)   Urine (mL/kg/hr) 1702 (0.8) 50   Drains 275    Total Output 1977 50   Net -72 +175        Foley:  5/25  A:  Hx of renal artery stenosis P:   -MONITOR RENAL FUNCTION  GASTROINTESTINAL No results found for this basename: AST:5,ALT:5,ALKPHOS:5,BILITOT:5,PROT:5,ALBUMIN:5 in the  last 168 hours  A:  Gi PROTECTION P:   -PPI  HEMATOLOGIC  Lab 02/28/12 0416 02/25/12 1730  HGB 9.4* 11.7*  HCT 29.0* 34.6*  PLT 208 234  INR -- 0.98  APTT -- --   A:  NONE P:  -MONITOR CBC  INFECTIOUS  Lab 02/28/12 0416 02/25/12 1730  WBC 10.3 9.7  PROCALCITON -- --   Cultures: NONE Antibiotics: NONE  A:  NO ISSUE P:     ENDOCRINE  Lab 02/28/12 0016 02/27/12 1949 02/27/12 1606 02/27/12 1445 02/27/12 1146  GLUCAP 116* 116* 149* 138* 191*   A:  STEROID INDUCED HYPERGYCEMIA   P:   -SSI  No further PCCM issues at this point, PCCM signing off, please call back if needed.  Koren Bound, M.D. [phone]

## 2012-02-28 NOTE — Progress Notes (Signed)
Physical Therapy Treatment Patient Details Name: Alicia Crawford MRN: [id number] DOB: [birth year] Today's Date: 02/28/2012 Time: 4098-1191 PT Time Calculation (min): 23 min  PT Assessment / Plan / Recommendation Comments on Treatment Session  Pt admitted s/p craniotomy and continues to progress.  Pt able to arouse and follow simple 1-step commands throughout treatment session with attention focused (< 5 seconds).  Pt continues to be a great CIR candidate.    Follow Up Recommendations  Inpatient Rehab    Barriers to Discharge        Equipment Recommendations  Defer to next venue    Recommendations for Other Services Rehab consult  Frequency Min 4X/week   Plan Discharge plan remains appropriate;Frequency remains appropriate    Precautions / Restrictions Precautions Precautions: Fall Restrictions Weight Bearing Restrictions: No   Pertinent Vitals/Pain N/A    Mobility  Bed Mobility Bed Mobility: Supine to Sit Supine to Sit: 4: Min assist;With rails;HOB elevated Sitting - Scoot to Edge of Bed: 4: Min assist;With rail Details for Bed Mobility Assistance: Assist to trunk to translate anterior as well as to initiate movement due to lethargy.  Cues for sequence. Transfers Transfers: Sit to Stand;Stand to Sit Sit to Stand: 4: Min assist;With upper extremity assist;From bed Stand to Sit: 4: Min assist;With upper extremity assist;To chair/3-in-1 Details for Transfer Assistance: Assist to trunk for balance with cues for safest hand placement/sequence.   Ambulation/Gait Ambulation/Gait Assistance: 4: Min assist Ambulation Distance (Feet): 5 Feet Assistive device: 1 person hand held assist Ambulation/Gait Assistance Details: Assist for balance with cues for safety.  Distance limited by lethargy. Gait Pattern: Decreased stride length;Step-through pattern;Trunk flexed Stairs: No Wheelchair Mobility Wheelchair Mobility: No    Exercises     PT Diagnosis:    PT Problem List:   PT  Treatment Interventions:     PT Goals Acute Rehab PT Goals PT Goal Formulation: With patient/family Time For Goal Achievement: 03/11/12 Potential to Achieve Goals: Fair PT Goal: Supine/Side to Sit - Progress: Progressing toward goal PT Goal: Sit to Stand - Progress: Progressing toward goal PT Goal: Stand to Sit - Progress: Progressing toward goal PT Goal: Ambulate - Progress: Progressing toward goal  Visit Information  Last PT Received On: 02/28/12 Assistance Needed: +1    Subjective Data  Subjective: Pt with slight lethargy supine in bed, but able to attend to session with focused attention.  Neice present throughout. Patient Stated Goal: Get stronger.   Cognition  Overall Cognitive Status: Difficult to assess Difficult to assess due to: Level of arousal;Impaired communication Arousal/Alertness: Lethargic Orientation Level: Disoriented to;Person Behavior During Session: Lethargic Cognition - Other Comments: Pt with expressive aphasia.  Able to follow 1-step commands about 75% of the time.  Limited mostly by lethargy.    Balance  Balance Balance Assessed: No  End of Session PT - End of Session Equipment Utilized During Treatment: Gait belt;Oxygen Activity Tolerance: Patient limited by fatigue;Patient tolerated treatment well Patient left: in chair;with call bell/phone within reach;with family/visitor present Nurse Communication: Mobility status    Cephus Shelling 02/28/2012, 10:15 AM  02/28/2012 Cephus Shelling, PT, DPT [phone]

## 2012-02-28 NOTE — Evaluation (Signed)
Occupational Therapy Evaluation Patient Details Name: Alicia Crawford MRN: [id number] DOB: Dec 28, 1955 Today's Date: 02/28/2012 Time: 4098-1191 OT Time Calculation (min): 21 min  OT Assessment / Plan / Recommendation Clinical Impression    Pt presents with a SDH following a fall s/p crani. Pt with aphasia and agitated. Limited eval today secondary to agitation. Pt will benefit from skilled OT in the acute setting followed by CIR prior to d/c    OT Assessment  Patient needs continued OT Services    Follow Up Recommendations  Inpatient Rehab    Barriers to Discharge      Equipment Recommendations  Defer to next venue    Recommendations for Other Services Rehab consult  Frequency  Min 2X/week    Precautions / Restrictions Precautions Precautions: Fall Restrictions Weight Bearing Restrictions: No   Pertinent Vitals/Pain Pt unable to indicated pain    ADL  Grooming: Performed;+1 Total assistance Where Assessed - Grooming: Unsupported sitting Lower Body Dressing: Performed;Minimal assistance (don socks) Where Assessed - Lower Body Dressing: Supine, head of bed up;Unsupported sitting ((circle sitting)) Toilet Transfer: Performed;Minimal assistance Toilet Transfer Method: Sit to Barista: Bedside commode Toileting - Clothing Manipulation and Hygiene: Performed;Minimal assistance Where Assessed - Engineer, mining and Hygiene: Standing Transfers/Ambulation Related to ADLs: Min A with side-step ambulation to/from toilet ADL Comments: Pt's agitation limited eval     OT Diagnosis: Cognitive deficits  OT Problem List: Decreased activity tolerance;Impaired balance (sitting and/or standing);Decreased safety awareness;Decreased knowledge of precautions;Cardiopulmonary status limiting activity OT Treatment Interventions: Self-care/ADL training;DME and/or AE instruction;Therapeutic activities;Cognitive remediation/compensation;Patient/family  education;Balance training   OT Goals Acute Rehab OT Goals OT Goal Formulation: With family Time For Goal Achievement: 03/13/12 Potential to Achieve Goals: Good ADL Goals Pt Will Perform Grooming: with supervision;Standing at sink ADL Goal: Grooming - Progress: Goal set today Pt Will Perform Upper Body Bathing: with set-up;with supervision;Sitting, chair;Sitting, edge of bed ADL Goal: Upper Body Bathing - Progress: Goal set today Pt Will Perform Lower Body Bathing: with set-up;with supervision;Sit to stand from chair;Sit to stand from bed ADL Goal: Lower Body Bathing - Progress: Goal set today Pt Will Perform Upper Body Dressing: with set-up;with supervision;Sitting, bed;Sitting, chair ADL Goal: Upper Body Dressing - Progress: Goal set today Pt Will Perform Lower Body Dressing: with set-up;with supervision;Sit to stand from bed;Sit to stand from chair ADL Goal: Lower Body Dressing - Progress: Goal set today Pt Will Transfer to Toilet: with supervision;Ambulation;with DME;3-in-1 ADL Goal: Toilet Transfer - Progress: Goal set today  Visit Information  Last OT Received On: 02/28/12 Assistance Needed: +2    Subjective Data  Subjective: Oh please, oh please! Patient Stated Goal: None stated   Prior Functioning  Home Living Lives With: Spouse Available Help at Discharge: Family;Available 24 hours/day Type of Home: House Home Access: Stairs to enter [employer] of Steps: 4 Entrance Stairs-Rails: None Home Layout: Two level;Able to live on main level with bedroom/bathroom Alternate Level Stairs-Number of Steps: 13 Alternate Level Stairs-Rails: Can reach both Bathroom Shower/Tub: Walk-in shower;Door [employer] Toilet: Standard Bathroom Accessibility: Yes How Accessible: Accessible via walker Home Adaptive Equipment: Shower chair with back;Walker - rolling;Straight cane Prior Function Level of Independence: Independent Able to Take Stairs?: Yes Driving: Yes Vocation:  Retired Musician: Expressive difficulties Dominant Hand: Right    Cognition  Overall Cognitive Status: Difficult to assess Difficult to assess due to: Impaired communication Arousal/Alertness: Awake/alert Orientation Level:  (NT) Behavior During Session: Agitated Cognition - Other Comments: Pt continually stating, "Oh please!" "Yes!"  and "No!" to all questions and commands     Extremity/Trunk Assessment Right Upper Extremity Assessment RUE ROM/Strength/Tone: Unable to fully assess;Due to impaired cognition Left Upper Extremity Assessment LUE ROM/Strength/Tone: Unable to fully assess;Due to impaired cognition   Mobility Bed Mobility Supine to Sit: 4: Min guard;With rails;HOB elevated Sitting - Scoot to Edge of Bed: 4: Min guard;With rail Sit to Supine: 4: Min guard;With rail;HOB elevated Details for Bed Mobility Assistance: pt with complete disregard for safety   Exercise    Balance    End of Session OT - End of Session Equipment Utilized During Treatment: Gait belt Activity Tolerance: Treatment limited secondary to agitation Patient left: in bed;with call bell/phone within reach;with nursing in room (sitter in room) Nurse Communication: Mobility status   Bach Rocchi 02/28/2012, 3:30 PM

## 2012-02-28 NOTE — Evaluation (Signed)
Speech Language Pathology Evaluation Patient Details Name: Alicia Crawford MRN: [id number] DOB: November 11, 1955 Today's Date: 02/28/2012 Time: 0910-0930 SLP Time Calculation (min): 20 min  Problem List:  Patient Active Problem List  Diagnoses  . Peripheral artery disease, bilta LE PVD by angio April 2013  . HLD (hyperlipidemia)  . HTN (hypertension)  . Renal artery stenosis, Right, 75%  . Subdural hematoma  . S/P craniotomy, 02/25/12 after fall, SDH  . Bradycardia, she has had baseline bradycardia prior to admission  . Altered mental status  . Obesity  . Diabetes mellitus  . CAD, cardiac arrest, CPR, CABG in [facility] Dec 2011-NL LVF by Echo, neg Nuc April 2013  . Anxiety, ? residual hypoxic brain inhury from cardiac arrest 2011   Past Medical History:  Past Medical History  Diagnosis Date  . Myocardial infarction   . Hypertension   . Leg pain   . Anoxic brain injury 08/2010    "w/cardiac arrest"  . Rotator cuff tendonitis   . Complication of anesthesia     [place]  . High cholesterol   . Cardiac arrest 08/04/2010  . Type II diabetes mellitus   . Blood transfusion 09/2010  . Depression   . DEMENTIA   . Coronary artery disease    Past Surgical History:  Past Surgical History  Procedure Date  . Ovarian cyst removal 1983  . Stomach and skin tuck 2000  . Posterior fusion lumbar spine ` 2000  . Cholecystectomy ~ 1980's  . 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 Cardiac surgery   . Back surgery     Assessment / Plan / Recommendation Clinical Impression  Demonstrates severe cognitive impairment with behaviors consistent with a Rancho V (confused, inappropiriate) as well as a severe expressive/receptive aphasia.  Patient will benefit from skilled SLP services to maximize her cognitive-linguistic abilities for return to home with 24 hour assistance. *Noteworthy: niece reports h/o anoxia s/p MI last year with some changes in her attention thereafter, however she  has returned to independence prior to this admission.    SLP Assessment  Patient needs continued Speech Language Pathology Services    Follow Up Recommendations  Inpatient Rehab    Frequency and Duration min 3x week  2 weeks   Pertinent Vitals/Pain n/a   SLP Goals  SLP Goals Potential to Achieve Goals: Good Potential Considerations: Co-morbidities;Previous level of function Progress/Goals/Alternative treatment plan discussed with pt/caregiver and they: Agree SLP Goal #1: Patient will sustain attention in functional ADL tasks for 1-2 minutes with moderate verbal/tactile/contextual cues. SLP Goal #2: Patient will verbalize automatics with 90% accuracy and maximum verbal cues. SLP Goal #3: Patient will follow basic 1-2 step commands in functional, familiar tasks with moderate contextual cues.  SLP Evaluation Prior Functioning  Cognitive/Linguistic Baseline: Baseline deficits Baseline deficit details: Niece reports decreased higher level attention after MI with anoxia in 2012 during a trip to [facility].  Patient has been living with husband, independent and driving, yet "she has been a little different since that episode." Type of Home: House Lives With: Spouse Available Help at Discharge: Family;Available 24 hours/day Vocation: Retired   IT consultant  Overall Cognitive Status: Impaired Arousal/Alertness: Child psychotherapist Level: Oriented to person Attention: Focused Memory: Impaired Awareness: Impaired Problem Solving: Impaired Behaviors: Restless Safety/Judgment: Impaired    Comprehension  Auditory Comprehension Overall Auditory Comprehension: Impaired Yes/No Questions: Impaired Basic Biographical Questions: 0-25% accurate Basic Immediate Environment Questions: 0-24% accurate Commands: Impaired One Step Basic Commands: 0-24% accurate  Interfering Components: Attention;Processing speed Visual Recognition/Discrimination Discrimination: Exceptions to [facility] Common  Objects: Unable to indentify Reading Comprehension Reading Status: Not tested    Expression Verbal Expression Overall Verbal Expression: Impaired Initiation: Impaired Automatic Speech: Name Level of Generative/Spontaneous Verbalization: Word;Phrase Repetition: Impaired Naming: Impairment Responsive: 0-25% accurate Confrontation: Impaired Common Objects: Unable to indentify Convergent: 0-24% accurate Interfering Components: Attention Written Expression Dominant Hand: Right Written Expression: Not tested   Oral / Motor Oral Motor/Sensory Function Overall Oral Motor/Sensory Function: Appears within functional limits for tasks assessed Motor Speech Articulation: Within functional limitis Motor Planning: Not tested     Myra Rude, M.S.,CCC-SLP Pager [phone] 02/28/2012, 10:01 AM

## 2012-02-28 NOTE — Progress Notes (Signed)
Speech Language Pathology Dysphagia Treatment Patient Details Name: Alicia Crawford MRN: [id number] DOB: 05/25/1956 Today's Date: 02/28/2012 Time: 0900-0910 SLP Time Calculation (min): 10 min  Assessment / Plan / Recommendation Clinical Impression  Demonstrates slightly improved attention span, although still mostly at the focused attention level (brief seconds) patient able to self feed all offered consistencies with a mildly delayed oral phase mainly due to her diminished attention span needing verbal/tactile cues to facitiate a quicker oral phase.  Pharyngeal phase is grossly WFL with 1 incident of a brief, delayed throat clear.   All other trials WFL.  See separate cognitive evaluation .    Diet Recommendation  Initiate / Change Diet: Regular;Thin liquid    SLP Plan Continue with current plan of care   Pertinent Vitals/Pain n/a   Swallowing Goals  SLP Swallowing Goals Patient will consume recommended diet without observed clinical signs of aspiration with: Minimal assistance Swallow Study Goal #1 - Progress: Progressing toward goal Patient will utilize recommended strategies during swallow to increase swallowing safety with: Minimal assistance Swallow Study Goal #2 - Progress: Progressing toward goal  Dysphagia Treatment Treatment focused on: Skilled observation of diet tolerance;Upgraded PO texture trials;Patient/family/caregiver education;Facilitation of oral phase Family/Caregiver Educated: Niece Treatment Methods/Modalities: Skilled observation;Differential diagnosis Patient observed directly with PO's: Yes Type of PO's observed: Regular;Dysphagia 1 (puree);Thin liquids Feeding: Able to feed self;Needs assist Liquids provided via: Cup Oral Phase Signs & Symptoms: Prolonged mastication;Prolonged bolus formation;Prolonged oral phase Pharyngeal Phase Signs & Symptoms: Delayed throat clear Type of cueing: Verbal;Tactile;Visual Amount of cueing: Total   Myra Rude,  M.S.,CCC-SLP Pager [phone] 02/28/2012, 9:46 AM

## 2012-02-28 NOTE — Progress Notes (Signed)
Subjective: No events overnight. Some agitation, difficulty with word finding. Responds "yes" to questions. Short attention span. No chest pain.  Objective: Vital signs in last 24 hours: Temp:  [97.6 F (36.4 C)-99.5 F (37.5 C)] 99 F (37.2 C) (05/28 0800) Pulse Rate:  [53-92] 65  (05/28 1100) Resp:  [10-39] 10  (05/28 1100) BP: (108-160)/(43-117) 153/57 mmHg (05/28 1100) SpO2:  [90 %-100 %] 97 % (05/28 1100) FiO2 (%):  [35 %-40 %] 35 % (05/27 1230) Weight change:    Intake/Output from previous day: -147 05/27 0701 - 05/28 0700 In: 1905 [I.V.:1800; IV Piggyback:105] Out: 1977 [Urine:1702; Drains:275] Intake/Output this shift: Total I/O In: 300 [I.V.:300] Out: 250 [Urine:250]  PE:  General: Opens eyes to vocal stimulus Skin: dry, warm HEENT: PERRLA, staples noted on front left scalp Neck: supple Heart: irregular, s1/s2, no M/Crawford/G's Lungs: lungs clear Abd: obese, soft, non-tender Ext: 2+ pulses, no edema Neuro: A&Ox3, CN II-XII intact   Lab Results:  Basename 02/28/12 0416 02/25/12 1730  WBC 10.3 9.7  HGB 9.4* 11.7*  HCT 29.0* 34.6*  PLT 208 234   BMET  Basename 02/28/12 0416 02/27/12 1027  NA 143 142  K 3.5 3.5  CL 109 107  CO2 27 26  GLUCOSE 124* 184*  BUN 13 15  CREATININE 0.52 0.52  CALCIUM 8.2* 8.7    Basename 02/27/12 1026  TROPONINI <0.30    No results found for this basename: CHOL, HDL, LDLCALC, LDLDIRECT, TRIG, CHOLHDL   No results found for this basename: HGBA1C     No results found for this basename: TSH    Hepatic Function Panel No results found for this basename: PROT,ALBUMIN,AST,ALT,ALKPHOS,BILITOT,BILIDIR,IBILI in the last 72 hours No results found for this basename: CHOL in the last 72 hours No results found for this basename: PROTIME in the last 72 hours  EKG: Orders placed during the hospital encounter of 02/25/12  . EKG 12-LEAD  . EKG 12-LEAD  . EKG 12-LEAD  . EKG 12-LEAD    Studies/Results: Ct Head Wo  Contrast  02/27/2012  *RADIOLOGY REPORT*  Clinical Data: Subdural hematoma.  CT HEAD WITHOUT CONTRAST  Technique:  Contiguous axial images were obtained from the base of the skull through the vertex without contrast.  Comparison: 05/26 and 02/25/2012  Findings: There has been no change in the appearance of the brain since prior study.  Diffuse subarachnoid hemorrhage, left posterior temporal parenchymal hematoma, and 2 mm of midline shift from left to right all appear stable.  Subdural hematoma has been evacuated. Drain and air is seen in the anterior left subdural space.  Left craniotomy is noted and appears unchanged.  IMPRESSION: No change in the appearance of brain since prior study.  Stable subarachnoid and parenchymal hemorrhage.  No recurrent subdural hematoma.  Stable 2 mm midline shift.  Original Report Authenticated By: Gwynn Burly, M.D.   Dg Chest Port 1 View  02/28/2012  *RADIOLOGY REPORT*  Clinical Data: Evaluate for atelectasis.  PORTABLE CHEST - 1 VIEW  Comparison: Chest x-ray 01/11/2012.  Findings: Lung volumes are low.  There are some minimal bibasilar predominately linear opacities favored to represent areas of subsegmental atelectasis.  No definite focal airspace consolidation.  No pleural effusions.  Pulmonary venous congestion without frank pulmonary edema.  Heart size is borderline enlarged, likely accentuated by low lung volumes and rotation. The patient is rotated to the right on today's exam, resulting in distortion of the mediastinal contours and reduced diagnostic sensitivity and specificity for mediastinal pathology.  Atherosclerosis in the thoracic aorta. The patient is status post median sternotomyfor CABG with a LIMA.  IMPRESSION: 1.  Low lung volumes with bibasilar subsegmental atelectasis. 2.  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 3.  Atherosclerosis.  Original Report Authenticated By: Florencia Reasons, M.D.    Medications: I have reviewed the patient's  current medications.    . insulin aspart  0-4 Units Subcutaneous Q4H  . levetiracetam  500 mg Intravenous Q12H  . mulitivitamin with minerals  1 tablet Oral Daily  . pantoprazole (PROTONIX) IV  40 mg Intravenous QHS  . senna-docusate  1 tablet Oral BID  . sertraline  150 mg Oral Daily  . DISCONTD: losartan  50 mg Oral Daily  . DISCONTD: metFORMIN  500 mg Oral Q breakfast  . DISCONTD: metoprolol succinate  37.5 mg Oral BID    Assessment/Plan: Patient Active Problem List  Diagnoses  . Peripheral artery disease, bilta LE PVD by angio April 2013  . HLD (hyperlipidemia)  . HTN (hypertension)  . Renal artery stenosis, Right, 75%  . Subdural hematoma  . S/P craniotomy, 02/25/12 after fall, SDH  . Bradycardia, she has had baseline bradycardia prior to admission  . Altered mental status  . Obesity  . Diabetes mellitus  . CAD, cardiac arrest, CPR, CABG in [facility] Dec 2011-NL LVF by Echo, neg Nuc April 2013  . Anxiety, ? 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   LOS: 3 days   Alicia Crawford,Alicia Crawford 02/28/2012, 11:58 AM  Pt. Seen and examined. Agree with the NP/PA-C note as written.  Telemetry reviewed overnight, there is sinus arrhythmia but no evidence for further pause. She is now off of b-blocker. Would continue to monitor telemetry. Arrhythmias may take several weeks to resolve, however, need for a pacemaker is unlikely.    Chrystie Nose, MD, [facility] Attending Cardiologist [facility]

## 2012-02-28 NOTE — Progress Notes (Signed)
Patient ID: Alicia Crawford, female   DOB: 31-May-1956, 56 y.o.   MRN: [id number] Subjective: Patient remains aphasic. Was less agitated through the night.  Objective: Vital signs in last 24 hours: Temp:  [97.6 F (36.4 C)-99.5 F (37.5 C)] 97.6 F (36.4 C) (05/28 0400) Pulse Rate:  [51-92] 65  (05/28 0600) Resp:  [12-39] 29  (05/28 0600) BP: (108-160)/(43-117) 138/61 mmHg (05/28 0600) SpO2:  [90 %-100 %] 97 % (05/28 0600) FiO2 (%):  [35 %-50 %] 35 % (05/27 1230)  Intake/Output from previous day: 05/27 0701 - 05/28 0700 In: 1905 [I.V.:1800; IV Piggyback:105] Out: 1977 [Urine:1702; Drains:275] Intake/Output this shift:    Neurologic exam: Patient remains aphasic but stable. She is alert and regards to the examiner. Seems to move all extremities equally. Pupils are equal reactive and gaze is conjugate. No facial asymmetry. Incision is clean dry and intact. Removed her drains today.  Lab Results: Lab Results  Component Value Date   WBC 10.3 02/28/2012   HGB 9.4* 02/28/2012   HCT 29.0* 02/28/2012   MCV 93.2 02/28/2012   PLT 208 02/28/2012   Lab Results  Component Value Date   INR 0.98 02/25/2012   BMET Lab Results  Component Value Date   NA 143 02/28/2012   K 3.5 02/28/2012   CL 109 02/28/2012   CO2 27 02/28/2012   GLUCOSE 124* 02/28/2012   BUN 13 02/28/2012   CREATININE 0.52 02/28/2012   CALCIUM 8.2* 02/28/2012    Studies/Results: Ct Head Wo Contrast  02/27/2012  *RADIOLOGY REPORT*  Clinical Data: Subdural hematoma.  CT HEAD WITHOUT CONTRAST  Technique:  Contiguous axial images were obtained from the base of the skull through the vertex without contrast.  Comparison: 05/26 and 02/25/2012  Findings: There has been no change in the appearance of the brain since prior study.  Diffuse subarachnoid hemorrhage, left posterior temporal parenchymal hematoma, and 2 mm of midline shift from left to right all appear stable.  Subdural hematoma has been evacuated. Drain and air is seen in the  anterior left subdural space.  Left craniotomy is noted and appears unchanged.  IMPRESSION: No change in the appearance of brain since prior study.  Stable subarachnoid and parenchymal hemorrhage.  No recurrent subdural hematoma.  Stable 2 mm midline shift.  Original Report Authenticated By: Gwynn Burly, M.D.    Assessment/Plan: Patient is stable. Continue therapy.   LOS: 3 days    Kalimah Capurro S 02/28/2012, 8:03 AM

## 2012-02-28 NOTE — Progress Notes (Signed)
eLink Physician-Brief Progress Note Patient Name: Alicia Crawford DOB: [birth year] MRN: [id number]  Date of Service  02/28/2012   HPI/Events of Note  Hypertensive, goal SBP < 170  eICU Interventions  Increased hydralazine to 10-20mg  q6h prn   Intervention Category Intermediate Interventions: Hypertension - evaluation and management  Alicia Crawford S. 02/28/2012, 6:52 PM

## 2012-02-29 DIAGNOSIS — W19XXXA Unspecified fall, initial encounter: Secondary | ICD-10-CM

## 2012-02-29 DIAGNOSIS — S065X9A Traumatic subdural hemorrhage with loss of consciousness of unspecified duration, initial encounter: Secondary | ICD-10-CM

## 2012-02-29 DIAGNOSIS — S065XAA Traumatic subdural hemorrhage with loss of consciousness status unknown, initial encounter: Secondary | ICD-10-CM

## 2012-02-29 LAB — GLUCOSE, CAPILLARY
Glucose-Capillary: 132 mg/dL — ABNORMAL HIGH (ref 70–99)
Glucose-Capillary: 156 mg/dL — ABNORMAL HIGH (ref 70–99)

## 2012-02-29 NOTE — Progress Notes (Addendum)
Subjective:  Awake, responds with "OK"  Objective:  Vital Signs in the last 24 hours: Temp:  [97.5 F (36.4 C)-99 F (37.2 C)] 97.6 F (36.4 C) (05/29 0800) Pulse Rate:  [64-98] 67  (05/29 0900) Resp:  [10-32] 22  (05/29 0900) BP: (108-189)/(41-124) 132/67 mmHg (05/29 0900) SpO2:  [92 %-100 %] 95 % (05/29 0900)  Intake/Output from previous day:  Intake/Output Summary (Last 24 hours) at 02/29/12 1023 Last data filed at 02/29/12 0900  Gross per 24 hour  Intake   2177 ml  Output   2875 ml  Net   -698 ml    Physical Exam: General appearance: alert, cooperative, no distress and slowed mentation Lungs: clear to auscultation bilaterally Heart: regular rate and rhythm   Rate: 55-80  Rhythm: normal sinus rhythm, no further significant bradycardia  Lab Results:  Basename 02/28/12 0416  WBC 10.3  HGB 9.4*  PLT 208    Basename 02/28/12 0416 02/27/12 1027  NA 143 142  K 3.5 3.5  CL 109 107  CO2 27 26  GLUCOSE 124* 184*  BUN 13 15  CREATININE 0.52 0.52    Basename 02/27/12 1026  TROPONINI <0.30   Hepatic Function Panel No results found for this basename: PROT,ALBUMIN,AST,ALT,ALKPHOS,BILITOT,BILIDIR,IBILI in the last 72 hours No results found for this basename: CHOL in the last 72 hours No results found for this basename: INR in the last 72 hours  Imaging: Imaging results have been reviewed  Cardiac Studies:  Assessment/Plan:   Active Problems:  Subdural hematoma  Bradycardia, she has had baseline bradycardia prior to admission  Altered mental status  Peripheral artery disease, bilta LE PVD by angio April 2013  S/P craniotomy, 02/25/12 after fall, SDH  Anxiety, ? 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 We can consider resuming beta blocker at low dose as an OP. Please call if any questions, we will be available as needed. MD to see.   Corine Shelter PA-C 02/29/2012, 10:23 AM  I have seen & evaluated the patient & reviewed the chart.  She has remained stable - with HR now up in the 80s off BB, but BP also trending up.  Would consider re-starting PO ARB as 1st choice to combat hypertension, next would consider restarting low dose BB (was on 37.5 mg Metoprolol bid - would restart @ ~12.5 mg bid)    On statin for HLD/CAD  We will continue to be available as needed for ?s or concerns.    Marykay Lex, M.D., M.S. [facility] [address] [place], [place]  [address]  [phone] Pager # [phone]  02/29/2012 3:19 PM

## 2012-02-29 NOTE — Plan of Care (Signed)
Problem: Consults Goal: Diagnosis - Craniotomy Outcome: Not Applicable Date Met:  02/29/12 Subdural hematoma  Comments:  Subdural hematoma

## 2012-02-29 NOTE — Progress Notes (Signed)
OT Cancellation Note  Treatment cancelled today due to pt. sleeping and unable to arrouse.Alicia Crawford [phone] 02/29/2012, 5:06 PM

## 2012-02-29 NOTE — Progress Notes (Signed)
Patient ID: Alicia Crawford, female   DOB: [birth year], 56 y.o.   MRN: [id number] Pt awake/ alert. Still some words, but can't repeat. Comprehension improved. Now follows simple commands and answers some yes/no questions. Incision CDI. MAE. Cont PT/OT/ST. Needs rehab.

## 2012-02-29 NOTE — Progress Notes (Signed)
Physical Therapy Treatment Patient Details Name: Alicia Crawford MRN: [id number] DOB: 06-Nov-1955 Today's Date: 02/29/2012 Time: 4098-1191 PT Time Calculation (min): 21 min  PT Assessment / Plan / Recommendation Comments on Treatment Session  Patient admitted from craniotomy. Patient progressing with ambulation today. Pt alert and followed simple concise one step commands with sustained attention (15 sec). Patient contimues to be a great CIR candidate    Follow Up Recommendations  Inpatient Rehab    Barriers to Discharge        Equipment Recommendations  Defer to next venue    Recommendations for Other Services Rehab consult  Frequency Min 4X/week   Plan Discharge plan remains appropriate;Frequency remains appropriate    Precautions / Restrictions Precautions Precautions: Fall   Pertinent Vitals/Pain     Mobility  Bed Mobility Bed Mobility: Not assessed Transfers Sit to Stand: 4: Min assist;With upper extremity assist;With armrests;From chair/3-in-1 Stand to Sit: 4: Min assist;With armrests;To chair/3-in-1 Details for Transfer Assistance: Patient stood x2 from [employer] forces operational officer. Cues for safest technique and positioning. A for stabilization with stand Ambulation/Gait Ambulation/Gait Assistance: 1: +2 Total assist Ambulation/Gait: Patient Percentage: 70% Ambulation Distance (Feet): 60 Feet Assistive device: 2 person hand held assist Ambulation/Gait Assistance Details: Assist for balance and cues for safety. Patient with various stride lengths. Patient required one sitting rest break Gait Pattern: Step-through pattern;Step-to pattern;Decreased stride length;Trunk flexed    Exercises     PT Diagnosis:    PT Problem List:   PT Treatment Interventions:     PT Goals Acute Rehab PT Goals PT Goal: Sit to Stand - Progress: Progressing toward goal PT Goal: Stand to Sit - Progress: Progressing toward goal PT Transfer Goal: Bed to Chair/Chair to Bed - Progress: Progressing toward  goal PT Goal: Ambulate - Progress: Progressing toward goal  Visit Information  Last PT Received On: 02/29/12 Assistance Needed: +2    Subjective Data      Cognition  Overall Cognitive Status: Impaired Area of Impairment: Safety/judgement;Following commands;Attention Difficult to assess due to: Impaired communication Arousal/Alertness: Awake/alert Orientation Level: Disoriented X4 Behavior During Session: Agitated Current Attention Level: Sustained Following Commands: Follows one step commands inconsistently Safety/Judgement: Decreased safety judgement for tasks assessed;Impulsive Cognition - Other Comments: patient answering "yes" to all questions asked.     Balance     End of Session      Fredrich Birks 02/29/2012, 2:05 PM 02/29/2012 Fredrich Birks PTA [phone] pager [phone] office

## 2012-02-29 NOTE — Consult Note (Signed)
Physical Medicine and Rehabilitation Consult Reason for Consult: Left SDH Referring Phsyician: Dr. Marikay Alar Koral Alicia Crawford is an 56 y.o. female.   HPI: 56 year old right-handed female with noted history of anoxic brain injury November 2011 with cardiac arrest, diabetes mellitus and dementia. Admitted 02/25/2012 after she fell backwards on a wet ramp and struck the back of her head. She denied loss of consciousness. Patient noted headache with nausea and vomiting. CT of the head showed a large subdural hematoma. Underwent left craniotomy for evacuation of subdural hematoma 02/25/2012 per Dr. Marikay Alar. Placed on Keppra for seizure prophylaxis. Postoperatively with aphasia/ agitation and delirium. Occupational therapy evaluation and speech therapy completed 02/28/2012 with recommendations of physical medicine rehabilitation consult and consider inpatient rehabilitation services Patient was very high functioning after her prior anoxic brain injury. She was driving, walking the dogs, totally independent. She lives with her niece Review of Systems  Unable to perform ROS: language   Past Medical History  Diagnosis Date  . Myocardial infarction   . Hypertension   . Leg pain   . Anoxic brain injury 08/2010    "w/cardiac arrest"  . Rotator cuff tendonitis   . Complication of anesthesia     [place]  . High cholesterol   . Cardiac arrest 08/04/2010  . Type II diabetes mellitus   . Blood transfusion 09/2010  . Depression   . DEMENTIA   . Coronary artery disease    Past Surgical History  Procedure Date  . Ovarian cyst removal 1983  . Stomach and skin tuck 2000  . Posterior fusion lumbar spine ` 2000  . Cholecystectomy ~ 1980's  . 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 Cardiac surgery   . Back surgery   . Craniotomy 02/25/2012    Procedure: CRANIOTOMY HEMATOMA EVACUATION SUBDURAL;  Surgeon: Alicia Alert, MD;  Location: [facility];  Service: Neurosurgery;  Laterality:  Left;  Left craniotomy for evacuation of subdural hematoma   Family History  Problem Relation Age of Onset  . Heart disease Other   . Diabetes Other   . Hypertension Other   . Alcohol abuse Other   . Mental illness Other   . Heart disease Mother   . Heart disease Father   . Heart disease Sister    Social History:  reports that she quit smoking about 6 years ago. Her smoking use included Cigarettes. She has a 30 pack-year smoking history. She has never used smokeless tobacco. She reports that she drinks alcohol. She reports that she does not use illicit drugs. Allergies:  Allergies  Allergen Reactions  . [place] Hives and Other (See Comments)    Gas used 1980's Reaction: affected her liver   Medications Prior to Admission  Medication Sig Dispense Refill  . ACETYLCARNITINE HCL PO Take 1 capsule by mouth daily.      [name] aspirin EC 325 MG tablet Take 162.5 mg by mouth daily. TAKE 1/2 DAILY      . atorvastatin (LIPITOR) 80 MG tablet Take 80 mg by mouth daily.      . cilostazol (PLETAL) 100 MG tablet Take 100 mg by mouth 2 (two) times daily.      . Coenzyme Q10 (EQL COQ10) 300 MG CAPS Take 1 capsule by mouth daily.      . Ibuprofen-Diphenhydramine Cit (IBUPROFEN PM PO) Take 2 tablets by mouth at bedtime.      [name] LECITHIN PO Take 1,200 mg by mouth daily.      [name]  LORazepam (ATIVAN) 1 MG tablet Take 1 mg by mouth at bedtime.       [name] losartan (COZAAR) 50 MG tablet Take 50 mg by mouth daily.        . metFORMIN (GLUCOPHAGE) 500 MG tablet Take 500 mg by mouth daily.      . metoprolol tartrate (LOPRESSOR) 25 MG tablet Take 37.5 mg by mouth 2 (two) times daily.      . Multiple Vitamin (MULITIVITAMIN WITH MINERALS) TABS Take 1 tablet by mouth daily.      [name] OVER THE COUNTER MEDICATION Take 1 capsule by mouth daily. Mega Red      . pantoprazole (PROTONIX) 40 MG tablet Take 40 mg by mouth daily.        . polycarbophil (FIBERCON) 625 MG tablet Take 625 mg by mouth daily.      . sertraline (ZOLOFT) 50  MG tablet Take 150 mg by mouth daily.        Home: Home Living Lives With: Spouse Available Help at Discharge: Family;Available 24 hours/day Type of Home: House Home Access: Stairs to enter [employer] of Steps: 4 Entrance Stairs-Rails: None Home Layout: Two level;Able to live on main level with bedroom/bathroom Alternate Level Stairs-Number of Steps: 13 Alternate Level Stairs-Rails: Can reach both Bathroom Shower/Tub: Walk-in shower;Door [employer] Toilet: Standard Bathroom Accessibility: Yes How Accessible: Accessible via walker Home Adaptive Equipment: Shower chair with back;Walker - rolling;Straight cane  Functional History: Prior Function Able to Take Stairs?: Yes Driving: Yes Vocation: Retired Functional Status:  Mobility: Bed Mobility Bed Mobility: Supine to Sit Supine to Sit: 4: Min guard;With rails;HOB elevated Sitting - Scoot to Edge of Bed: 4: Min guard;With rail Sit to Supine: 4: Min guard;With rail;HOB elevated Transfers Transfers: Sit to Stand;Stand to Sit Sit to Stand: 4: Min assist;With upper extremity assist;From bed Stand to Sit: 4: Min assist;With upper extremity assist;To chair/3-in-1 Stand Pivot Transfers: 4: Min assist;With armrests Ambulation/Gait Ambulation/Gait Assistance: 4: Min assist Ambulation Distance (Feet): 5 Feet Assistive device: 1 person hand held assist Ambulation/Gait Assistance Details: Assist for balance with cues for safety.  Distance limited by lethargy. Gait Pattern: Decreased stride length;Step-through pattern;Trunk flexed Stairs: No Wheelchair Mobility Wheelchair Mobility: No  ADL: ADL Grooming: Performed;+1 Total assistance Where Assessed - Grooming: Unsupported sitting Lower Body Dressing: Performed;Minimal assistance (don socks) Where Assessed - Lower Body Dressing: Supine, head of bed up;Unsupported sitting ((circle sitting)) Toilet Transfer: Performed;Minimal assistance Toilet Transfer Method: Sit to  stand Toilet Transfer Equipment: Bedside commode Transfers/Ambulation Related to ADLs: Min A with side-step ambulation to/from toilet ADL Comments: Pt's agitation limited eval   Cognition: Cognition Overall Cognitive Status: Impaired Arousal/Alertness: Awake/Crawford Orientation Level: Other (comment) (Pt. aphasic) Attention: Focused Memory: Impaired Awareness: Impaired Problem Solving: Impaired Behaviors: Restless Safety/Judgment: Impaired Cognition Overall Cognitive Status: Difficult to assess Difficult to assess due to: Impaired communication Arousal/Alertness: Awake/Crawford Orientation Level:  (NT) Behavior During Session: Agitated Cognition - Other Comments: Pt continually stating, "Oh please!" "Yes!" and "No!" to all questions and commands   Blood pressure 158/93, pulse 69, temperature 98.5 F (36.9 C), temperature source Oral, resp. rate 19, height 5\' 7"  (1.702 m), weight 91.5 kg (201 lb 11.5 oz), SpO2 94.00%. Physical Exam  Vitals reviewed. Neck: Neck supple. No thyromegaly present.  Cardiovascular:       Cardiac rate control  Pulmonary/Chest: Breath sounds normal. She has no wheezes.  Abdominal: She exhibits no distension. There is no tenderness.  Musculoskeletal: She exhibits no edema.  Neurological:  Patient was lethargic but arousable. She was a phasic and able to use yes and no with inconsistency area she would not follow commands  Skin:       Craniotomy site dressed  Psychiatric:       Patient with limited awareness of her deficits again difficult to assess due to her aphasia  patient can state her name area unable to state place or time. Obvious word finding deficits. Perseverates on yes when presented with yes no questions. Difficulty following more complex commands. He is able to imitate gestures. He is able to perform manual muscle testing. Sensory testing needs to be modified based on her aphasia Sensory withdraws to pain she and all 4 extremities however  less acute sensation in the right upper extremity Motor strength is 3/5 in the right deltoid, biceps, triceps, grip, 4/5 in the right hip flexor, knee extensors, ankle dorsiflexor 5/5 on the left side Scalp which has healing craniotomy incision with staples no evidence of drainage. There is moderate amount of swelling in the right frontal parietal area  Results for orders placed during the hospital encounter of 02/25/12 (from the past 24 hour(s))  GLUCOSE, CAPILLARY     Status: Abnormal   Collection Time   02/28/12  7:15 AM      Component Value Range   Glucose-Capillary 133 (*) 70 - 99 (mg/dL)  GLUCOSE, CAPILLARY     Status: Abnormal   Collection Time   02/28/12 12:04 PM      Component Value Range   Glucose-Capillary 161 (*) 70 - 99 (mg/dL)  GLUCOSE, CAPILLARY     Status: Abnormal   Collection Time   02/28/12  4:27 PM      Component Value Range   Glucose-Capillary 153 (*) 70 - 99 (mg/dL)   Comment 1 Notify RN     Comment 2 Documented in Chart    GLUCOSE, CAPILLARY     Status: Abnormal   Collection Time   02/28/12  7:42 PM      Component Value Range   Glucose-Capillary 196 (*) 70 - 99 (mg/dL)   Comment 1 Documented in Chart     Comment 2 Notify RN    GLUCOSE, CAPILLARY     Status: Abnormal   Collection Time   02/28/12 11:35 PM      Component Value Range   Glucose-Capillary 176 (*) 70 - 99 (mg/dL)   Comment 1 Notify RN    GLUCOSE, CAPILLARY     Status: Abnormal   Collection Time   02/29/12  4:10 AM      Component Value Range   Glucose-Capillary 132 (*) 70 - 99 (mg/dL)   Comment 1 Notify RN     Dg Chest Port 1 View  02/28/2012  *RADIOLOGY REPORT*  Clinical Data: Evaluate for atelectasis.  PORTABLE CHEST - 1 VIEW  Comparison: Chest x-ray 01/11/2012.  Findings: Lung volumes are low.  There are some minimal bibasilar predominately linear opacities favored to represent areas of subsegmental atelectasis.  No definite focal airspace consolidation.  No pleural effusions.  Pulmonary venous  congestion without frank pulmonary edema.  Heart size is borderline enlarged, likely accentuated by low lung volumes and rotation. The patient is rotated to the right on today's exam, resulting in distortion of the mediastinal contours and reduced diagnostic sensitivity and specificity for mediastinal pathology.  Atherosclerosis in the thoracic aorta. The patient is status post median sternotomyfor CABG with a LIMA.  IMPRESSION: 1.  Low lung volumes with bibasilar subsegmental atelectasis.  2.  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 3.  Atherosclerosis.  Original Report Authenticated By: Florencia Reasons, M.D.    Assessment/Plan: Diagnosis: left frontal subdural hemorrhage status post evacuation postoperative day 3. In addition she has a left temporal contusion and hemorrhage which is likely contributing to her receptive language deficits. She has expressive language deficits as well as right upper tremor the weakness and right lower extremity weakness. 1. Does the need for close, 24 hr/day medical supervision in concert with the patient's rehab needs make it unreasonable for this patient to be served in a less intensive setting? Yes 2. Co-Morbidities requiring supervision/potential complications: coronary artery disease, diabetes, renal artery stenosis, peripheral artery disease, prior anoxic encephalopathy 3. Due to bladder management, bowel management, safety, skin/wound care, disease management, medication administration, pain management and patient education, does the patient require 24 hr/day rehab nursing? Yes 4. Does the patient require coordinated care of a physician, rehab nurse, PT (1-2 hrs/day, 5 days/week), OT (1-2 hrs/day, 5 days/week) and SLP (0.5-1 hrs/day, 5 days/week) to address physical and functional deficits in the context of the above medical diagnosis(es)? Yes Addressing deficits in the following areas: balance, endurance, locomotion, strength, transferring,  bowel/bladder control, bathing, dressing, feeding, grooming, toileting, cognition, speech and language 5. Can the patient actively participate in an intensive therapy program of at least 3 hrs of therapy per day at least 5 days per week? Yes 6. The potential for patient to make measurable gains while on inpatient rehab is good 7. Anticipated functional outcomes upon discharge from inpatient rehab are supervision mobility with PT, supervision ADLs with OT, answer yes no questions with 90% accuracy with SLP. 8. Estimated rehab length of stay to reach the above functional goals is: 2-3 weeks 9. Does the patient have adequate social supports to accommodate these discharge functional goals? Potentially 10. Anticipated D/C setting: Home 11. Anticipated post D/C treatments: Outpt therapy 12. Overall Rehab/Functional Prognosis: good  RECOMMENDATIONS: This patient's condition is appropriate for continued rehabilitative care in the following setting: CIR Patient has agreed to participate in recommended program. Potentially Note that insurance prior authorization may be required for reimbursement for recommended care.  Comment:   ANGIULLI,DANIEL J. 02/29/2012

## 2012-02-29 NOTE — Progress Notes (Signed)
Speech Language Pathology Dysphagia Treatment Patient Details Name: Alicia Crawford MRN: [id number] DOB: 1955/12/07 Today's Date: 02/29/2012 Time: 6578-4696 SLP Time Calculation (min): 38 min  Assessment / Plan / Recommendation Clinical Impression  Treatment focused on both cognitive-linguistic and swallow goals.  Patient more alert in this session and verbalizing social, automatic responses more timely and accurately.  Yes/no response continues to be perseverative on "yes" either verbally or via head nods.  Attention still at the focused level (a few seconds) and likely still a [place] V (confused, appropriate).  Language abilities are likely impaired yet difficult to effectively assess due to cognitive impairment.  PO assessment of regular and thin liquids was judged to be [facility] with oral anterior spillage due to poor attention with cup sipping, requiring minimal assist with slow rate and small sips.    Diet Recommendation  Continue with Current Diet: Regular;Thin liquid    SLP Plan Continue with current plan of care   Pertinent Vitals/Pain n/a   Swallowing Goals  SLP Swallowing Goals Patient will consume recommended diet without observed clinical signs of aspiration with: Minimal assistance Swallow Study Goal #1 - Progress: Progressing toward goal Patient will utilize recommended strategies during swallow to increase swallowing safety with: Minimal assistance Swallow Study Goal #2 - Progress: Progressing toward goal  Dysphagia Treatment Treatment focused on: Skilled observation of diet tolerance;Patient/family/caregiver education Family/Caregiver Educated: Niece Treatment Methods/Modalities: Skilled observation Patient observed directly with PO's: Yes Type of PO's observed: Regular;Thin liquids Feeding: Able to feed self;Needs assist Liquids provided via: Cup Oral Phase Signs & Symptoms: Anterior loss/spillage Type of cueing: Verbal;Tactile Amount of cueing: Minimal   Myra Rude, M.S.,CCC-SLP Pager [phone] 02/29/2012, 10:20 AM

## 2012-03-01 ENCOUNTER — Inpatient Hospital Stay (HOSPITAL_COMMUNITY)
Admission: RE | Admit: 2012-03-01 | Discharge: 2012-03-16 | DRG: 945 | Disposition: A | Discharge status: Home / Self Care | Payer: PRIVATE HEALTH INSURANCE | Source: Ambulatory Visit | Attending: Physical Medicine & Rehabilitation | Admitting: Physical Medicine & Rehabilitation

## 2012-03-01 DIAGNOSIS — Z79899 Other long term (current) drug therapy: Secondary | ICD-10-CM

## 2012-03-01 DIAGNOSIS — S065XAA Traumatic subdural hemorrhage with loss of consciousness status unknown, initial encounter: Secondary | ICD-10-CM

## 2012-03-01 DIAGNOSIS — W19XXXA Unspecified fall, initial encounter: Secondary | ICD-10-CM

## 2012-03-01 DIAGNOSIS — Z5189 Encounter for other specified aftercare: Secondary | ICD-10-CM

## 2012-03-01 DIAGNOSIS — Z9889 Other specified postprocedural states: Secondary | ICD-10-CM

## 2012-03-01 DIAGNOSIS — F0390 Unspecified dementia without behavioral disturbance: Secondary | ICD-10-CM

## 2012-03-01 DIAGNOSIS — Z7982 Long term (current) use of aspirin: Secondary | ICD-10-CM

## 2012-03-01 DIAGNOSIS — F411 Generalized anxiety disorder: Secondary | ICD-10-CM

## 2012-03-01 DIAGNOSIS — I252 Old myocardial infarction: Secondary | ICD-10-CM

## 2012-03-01 DIAGNOSIS — E7800 Pure hypercholesterolemia, unspecified: Secondary | ICD-10-CM

## 2012-03-01 DIAGNOSIS — F329 Major depressive disorder, single episode, unspecified: Secondary | ICD-10-CM

## 2012-03-01 DIAGNOSIS — Z951 Presence of aortocoronary bypass graft: Secondary | ICD-10-CM

## 2012-03-01 DIAGNOSIS — R4701 Aphasia: Secondary | ICD-10-CM

## 2012-03-01 DIAGNOSIS — F3289 Other specified depressive episodes: Secondary | ICD-10-CM

## 2012-03-01 DIAGNOSIS — S065X0A Traumatic subdural hemorrhage without loss of consciousness, initial encounter: Secondary | ICD-10-CM

## 2012-03-01 DIAGNOSIS — Z87891 Personal history of nicotine dependence: Secondary | ICD-10-CM

## 2012-03-01 DIAGNOSIS — E119 Type 2 diabetes mellitus without complications: Secondary | ICD-10-CM

## 2012-03-01 DIAGNOSIS — G8190 Hemiplegia, unspecified affecting unspecified side: Secondary | ICD-10-CM

## 2012-03-01 DIAGNOSIS — R488 Other symbolic dysfunctions: Secondary | ICD-10-CM

## 2012-03-01 DIAGNOSIS — I10 Essential (primary) hypertension: Secondary | ICD-10-CM

## 2012-03-01 DIAGNOSIS — S065X9A Traumatic subdural hemorrhage with loss of consciousness of unspecified duration, initial encounter: Secondary | ICD-10-CM

## 2012-03-01 DIAGNOSIS — W010XXA Fall on same level from slipping, tripping and stumbling without subsequent striking against object, initial encounter: Secondary | ICD-10-CM

## 2012-03-01 DIAGNOSIS — I2510 Atherosclerotic heart disease of native coronary artery without angina pectoris: Secondary | ICD-10-CM

## 2012-03-01 LAB — GLUCOSE, CAPILLARY
Glucose-Capillary: 139 mg/dL — ABNORMAL HIGH (ref 70–99)
Glucose-Capillary: 144 mg/dL — ABNORMAL HIGH (ref 70–99)
Glucose-Capillary: 163 mg/dL — ABNORMAL HIGH (ref 70–99)
Glucose-Capillary: 190 mg/dL — ABNORMAL HIGH (ref 70–99)

## 2012-03-01 MED ORDER — CLONIDINE HCL 0.1 MG PO TABS
0.1000 mg | ORAL_TABLET | ORAL | Status: DC | PRN
  Administered 2012-03-01: 0.1 mg via ORAL
  Filled 2012-03-01: qty 1

## 2012-03-01 MED ORDER — ONDANSETRON HCL 4 MG PO TABS
4.0000 mg | ORAL_TABLET | Freq: Four times a day (QID) | ORAL | Status: DC | PRN
  Administered 2012-03-02 – 2012-03-14 (×2): 4 mg via ORAL
  Filled 2012-03-01 (×2): qty 1

## 2012-03-01 MED ORDER — HYDRALAZINE HCL 20 MG/ML IJ SOLN: 10.0000 mg | Freq: Four times a day (QID) | INTRAMUSCULAR | Status: DC | PRN

## 2012-03-01 MED ORDER — ADULT MULTIVITAMIN W/MINERALS CH
1.0000 | ORAL_TABLET | Freq: Every day | ORAL | Status: DC
  Administered 2012-03-02 – 2012-03-16 (×15): 1 via ORAL
  Filled 2012-03-01 (×18): qty 1

## 2012-03-01 MED ORDER — POLYETHYLENE GLYCOL 3350 17 G PO PACK
17.0000 g | PACK | Freq: Every day | ORAL | Status: DC | PRN
  Administered 2012-03-15: 17 g via ORAL
  Filled 2012-03-01: qty 1

## 2012-03-01 MED ORDER — ACETAMINOPHEN 325 MG PO TABS
325.0000 mg | ORAL_TABLET | ORAL | Status: DC | PRN
  Administered 2012-03-02 – 2012-03-03 (×3): 650 mg via ORAL
  Administered 2012-03-03 – 2012-03-04 (×2): 325 mg via ORAL
  Administered 2012-03-08 – 2012-03-15 (×7): 650 mg via ORAL
  Filled 2012-03-01 (×4): qty 2
  Filled 2012-03-01: qty 1
  Filled 2012-03-01 (×7): qty 2

## 2012-03-01 MED ORDER — ONDANSETRON HCL 4 MG/2ML IJ SOLN
4.0000 mg | Freq: Four times a day (QID) | INTRAMUSCULAR | Status: DC | PRN
  Administered 2012-03-02: 4 mg via INTRAVENOUS
  Filled 2012-03-01: qty 2

## 2012-03-01 MED ORDER — LEVETIRACETAM 500 MG PO TABS
500.0000 mg | ORAL_TABLET | Freq: Two times a day (BID) | ORAL | Status: DC
  Administered 2012-03-01 – 2012-03-15 (×29): 500 mg via ORAL
  Filled 2012-03-01 (×33): qty 1

## 2012-03-01 MED ORDER — INSULIN ASPART 100 UNIT/ML ~~LOC~~ SOLN
0.0000 [IU] | SUBCUTANEOUS | Status: DC
  Administered 2012-03-02 (×3): 3 [IU] via SUBCUTANEOUS
  Administered 2012-03-02: 1 [IU] via SUBCUTANEOUS
  Administered 2012-03-02: 3 [IU] via SUBCUTANEOUS
  Administered 2012-03-02 – 2012-03-03 (×4): 1 [IU] via SUBCUTANEOUS
  Administered 2012-03-03 – 2012-03-04 (×4): 3 [IU] via SUBCUTANEOUS
  Administered 2012-03-04: 1 [IU] via SUBCUTANEOUS
  Administered 2012-03-04: 3 [IU] via SUBCUTANEOUS

## 2012-03-01 MED ORDER — SORBITOL 70 % SOLN
30.0000 mL | Freq: Every day | Status: DC | PRN
  Administered 2012-03-05: 30 mL via ORAL
  Filled 2012-03-01: qty 30

## 2012-03-01 MED ORDER — SERTRALINE HCL 50 MG PO TABS
150.0000 mg | ORAL_TABLET | Freq: Every day | ORAL | Status: DC
  Administered 2012-03-02 – 2012-03-16 (×15): 150 mg via ORAL
  Filled 2012-03-01 (×17): qty 1

## 2012-03-01 MED ORDER — HYDROCODONE-ACETAMINOPHEN 5-325 MG PO TABS
1.0000 | ORAL_TABLET | ORAL | Status: DC | PRN
  Administered 2012-03-02 – 2012-03-16 (×57): 1 via ORAL
  Filled 2012-03-01 (×58): qty 1

## 2012-03-01 MED ORDER — PANTOPRAZOLE SODIUM 40 MG IV SOLR
40.0000 mg | Freq: Every day | INTRAVENOUS | Status: DC
  Administered 2012-03-01: 40 mg via INTRAVENOUS
  Filled 2012-03-01 (×2): qty 40

## 2012-03-01 NOTE — Progress Notes (Signed)
Physical Therapy Treatment Patient Details Name: Alicia Crawford MRN: [id number] DOB: May 08, 1956 Today's Date: 03/01/2012 Time: 0850-0920 PT Time Calculation (min): 30 min  PT Assessment / Plan / Recommendation Comments on Treatment Session  Patient admitted for craniotomy. Patient verbalizing more today. Able to stated her name and family members name. Patient stated her dogs names and able to verbalize that she was in pain and needed medication    Follow Up Recommendations       Barriers to Discharge        Equipment Recommendations  Defer to next venue    Recommendations for Other Services Rehab consult  Frequency Min 4X/week   Plan Discharge plan remains appropriate;Frequency remains appropriate    Precautions / Restrictions Precautions Precautions: Fall   Pertinent Vitals/Pain Complaints of headache. Not able to rate    Mobility  Bed Mobility Supine to Sit: 5: Supervision Sitting - Scoot to Edge of Bed: 5: Supervision Transfers Sit to Stand: 4: Min assist;With armrests;From bed;From chair/3-in-1 Stand to Sit: 4: Min assist;With armrests;To chair/3-in-1;With upper extremity assist Details for Transfer Assistance: Patient stood x4 times total. Cues for safe technique. A to control descent onto sitting surface Ambulation/Gait Ambulation/Gait Assistance: 4: Min assist Ambulation Distance (Feet): 200 Feet Assistive device: Rolling walker Ambulation/Gait Assistance Details: Patient very ataxic with ambulation today. A for balance. Cues and A with RW management and proximity of RW to body Gait Pattern: Step-through pattern;Decreased stride length;Decreased hip/knee flexion - right;Decreased dorsiflexion - right;Decreased hip/knee flexion - left;Decreased dorsiflexion - left Gait velocity: decreased    Exercises     PT Diagnosis:    PT Problem List:   PT Treatment Interventions:     PT Goals Acute Rehab PT Goals PT Goal: Supine/Side to Sit - Progress: Progressing  toward goal PT Goal: Sit to Stand - Progress: Progressing toward goal PT Goal: Stand to Sit - Progress: Progressing toward goal PT Transfer Goal: Bed to Chair/Chair to Bed - Progress: Progressing toward goal PT Goal: Ambulate - Progress: Progressing toward goal  Visit Information  Last PT Received On: 03/01/12 Assistance Needed: +2 (for safety and lines)    Subjective Data  Subjective: I have been through a number   Cognition  Overall Cognitive Status: Impaired Area of Impairment: Attention;Memory;Following commands;Safety/judgement Arousal/Alertness: Awake/alert Orientation Level: Disoriented X4;Situation;Time;Place Behavior During Session: [facility] for tasks performed Current Attention Level: Sustained Following Commands: Follows one step commands inconsistently Safety/Judgement: Decreased awareness of safety precautions;Decreased safety judgement for tasks assessed;Impulsive    Balance     End of Session PT - End of Session Equipment Utilized During Treatment: Gait belt Activity Tolerance: Patient tolerated treatment well Patient left: in chair;with call bell/phone within reach (sitting) Nurse Communication: Mobility status    Fredrich Birks 03/01/2012, 2:20 PM 03/01/2012 Fredrich Birks PTA [phone] pager [phone] office

## 2012-03-01 NOTE — H&P (Signed)
Physical Medicine and Rehabilitation Admission H&P    No chief complaint on file. : HPI: 56 year old right-handed female with noted history of anoxic brain injury November 2011 with cardiac arrest, diabetes mellitus and dementia. Patient was very high functioning after prior anoxic brain injury. She was driving, walking the dogs and independent. Admitted 02/25/2012 after she fell backwards on a wet ramp and struck the back of her head. She denied loss of consciousness. Patient noted headache with nausea and vomiting. CT of the head showed a large subdural hematoma. Underwent left craniotomy for evacuation of subdural hematoma 02/25/2012 per Dr. Marikay Alar. Placed on Keppra for seizure prophylaxis. Postoperatively with aphasia/ agitation and delirium. Followup cardiology services for history of hypertension with cardiac arrest in the past as well as bradycardia. Currently on no antihypertensive medications considering resuming low-dose beta blocker and close monitoring of heart rate. She is on regular diet. Occupational therapy evaluation and speech therapy completed 02/28/2012 with recommendations of physical medicine rehabilitation consult and consider inpatient rehabilitation services    Review of Systems  Unable to perform ROS: Aphasic   Past Medical History  Diagnosis Date  . Myocardial infarction   . Hypertension   . Leg pain   . Anoxic brain injury 08/2010    "w/cardiac arrest"  . Rotator cuff tendonitis   . Complication of anesthesia     [place]  . High cholesterol   . Cardiac arrest 08/04/2010  . Type II diabetes mellitus   . Blood transfusion 09/2010  . Depression   . DEMENTIA   . Coronary artery disease    Past Surgical History  Procedure Date  . Ovarian cyst removal 1983  . Stomach and skin tuck 2000  . Posterior fusion lumbar spine ` 2000  . Cholecystectomy ~ 1980's  . 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 Cardiac surgery   . Back surgery   .  Craniotomy 02/25/2012    Procedure: CRANIOTOMY HEMATOMA EVACUATION SUBDURAL;  Surgeon: Tia Alert, MD;  Location: [facility];  Service: Neurosurgery;  Laterality: Left;  Left craniotomy for evacuation of subdural hematoma   Family History  Problem Relation Age of Onset  . Heart disease Other   . Diabetes Other   . Hypertension Other   . Alcohol abuse Other   . Mental illness Other   . Heart disease Mother   . Heart disease Father   . Heart disease Sister    Social History:  reports that she quit smoking about 6 years ago. Her smoking use included Cigarettes. She has a 30 pack-year smoking history. She has never used smokeless tobacco. She reports that she drinks alcohol. She reports that she does not use illicit drugs. Allergies:  Allergies  Allergen Reactions  . [place] Hives and Other (See Comments)    Gas used 1980's Reaction: affected her liver   Medications Prior to Admission  Medication Sig Dispense Refill  . ACETYLCARNITINE HCL PO Take 1 capsule by mouth daily.      [name] aspirin EC 325 MG tablet Take 162.5 mg by mouth daily. TAKE 1/2 DAILY      . atorvastatin (LIPITOR) 80 MG tablet Take 80 mg by mouth daily.      . cilostazol (PLETAL) 100 MG tablet Take 100 mg by mouth 2 (two) times daily.      . Coenzyme Q10 (EQL COQ10) 300 MG CAPS Take 1 capsule by mouth daily.      . Ibuprofen-Diphenhydramine Cit (IBUPROFEN PM  PO) Take 2 tablets by mouth at bedtime.      [name] LECITHIN PO Take 1,200 mg by mouth daily.      [name] LORazepam (ATIVAN) 1 MG tablet Take 1 mg by mouth at bedtime.       [name] losartan (COZAAR) 50 MG tablet Take 50 mg by mouth daily.        . metFORMIN (GLUCOPHAGE) 500 MG tablet Take 500 mg by mouth daily.      . metoprolol tartrate (LOPRESSOR) 25 MG tablet Take 37.5 mg by mouth 2 (two) times daily.      . Multiple Vitamin (MULITIVITAMIN WITH MINERALS) TABS Take 1 tablet by mouth daily.      [name] OVER THE COUNTER MEDICATION Take 1 capsule by mouth daily. Mega Red      .  pantoprazole (PROTONIX) 40 MG tablet Take 40 mg by mouth daily.        . polycarbophil (FIBERCON) 625 MG tablet Take 625 mg by mouth daily.      . sertraline (ZOLOFT) 50 MG tablet Take 150 mg by mouth daily.        Home:     Functional History:    Functional Status:  Mobility:          ADL:    Cognition:       Blood pressure 206/67. Physical Exam  Vitals reviewed.  Neck: Neck supple. No thyromegaly present.  Cardiovascular:  Cardiac rate control  Pulmonary/Chest: Breath sounds normal. She has no wheezes.  Abdominal: She exhibits no distension. There is no tenderness.  Musculoskeletal: She exhibits no edema.  Neurological:  Patient was lethargic but arousable. She was a phasic and able to use yes and no with some inconsistency and perseverates. She followed basic commands with cues Skin:  Craniotomy site dressed  Psychiatric:  Patient with limited awareness of her deficits again difficult to assess due to her aphasia  patient can state her name area unable to state place or time. Obvious word finding deficits. Perseverates on yes when presented with yes no questions. Difficulty following more complex commands. He is able to imitate gestures. He is able to perform manual muscle testing. Sensory testing needs to be modified based on her aphasia  Sensory withdraws to pain she and all 4 extremities however less acute sensation in the right upper extremity  Motor strength is 3/5 in the right deltoid, biceps, triceps, grip, 4/5 in the right hip flexor, knee extensors, ankle dorsiflexor  5/5 on the left side  Scalp which has healing craniotomy incision with staples no evidence of drainage. There is moderate amount of swelling in the right frontal parietal area   Results for orders placed during the hospital encounter of 02/25/12 (from the past 48 hour(s))  GLUCOSE, CAPILLARY     Status: Abnormal   Collection Time   02/28/12 11:35 PM      Component Value Range Comment    Glucose-Capillary 176 (*) 70 - 99 (mg/dL)    Comment 1 Notify RN     GLUCOSE, CAPILLARY     Status: Abnormal   Collection Time   02/29/12  4:10 AM      Component Value Range Comment   Glucose-Capillary 132 (*) 70 - 99 (mg/dL)    Comment 1 Notify RN     GLUCOSE, CAPILLARY     Status: Abnormal   Collection Time   02/29/12  7:58 AM      Component Value Range Comment   Glucose-Capillary 156 (*) 70 -  99 (mg/dL)   GLUCOSE, CAPILLARY     Status: Abnormal   Collection Time   02/29/12 11:53 AM      Component Value Range Comment   Glucose-Capillary 158 (*) 70 - 99 (mg/dL)   GLUCOSE, CAPILLARY     Status: Abnormal   Collection Time   02/29/12  4:22 PM      Component Value Range Comment   Glucose-Capillary 152 (*) 70 - 99 (mg/dL)   GLUCOSE, CAPILLARY     Status: Abnormal   Collection Time   02/29/12  7:38 PM      Component Value Range Comment   Glucose-Capillary 184 (*) 70 - 99 (mg/dL)    Comment 1 Notify RN     GLUCOSE, CAPILLARY     Status: Abnormal   Collection Time   02/29/12 11:37 PM      Component Value Range Comment   Glucose-Capillary 139 (*) 70 - 99 (mg/dL)    Comment 1 Notify RN     GLUCOSE, CAPILLARY     Status: Abnormal   Collection Time   03/01/12  3:36 AM      Component Value Range Comment   Glucose-Capillary 144 (*) 70 - 99 (mg/dL)    Comment 1 Notify RN     GLUCOSE, CAPILLARY     Status: Abnormal   Collection Time   03/01/12  7:21 AM      Component Value Range Comment   Glucose-Capillary 159 (*) 70 - 99 (mg/dL)   GLUCOSE, CAPILLARY     Status: Abnormal   Collection Time   03/01/12 11:36 AM      Component Value Range Comment   Glucose-Capillary 190 (*) 70 - 99 (mg/dL)   GLUCOSE, CAPILLARY     Status: Abnormal   Collection Time   03/01/12  5:07 PM      Component Value Range Comment   Glucose-Capillary 163 (*) 70 - 99 (mg/dL)    Comment 1 Notify RN      Comment 2 Documented in Chart      No results found.  Post Admission Physician Evaluation: 1. Functional  deficits secondary  to Left subdural hematoma with R hemiparesis and aphasia and apraxia. 2. Patient is admitted to receive collaborative, interdisciplinary care between the physiatrist, rehab nursing staff, and therapy team. 3. Patient's level of medical complexity and substantial therapy needs in context of that medical necessity cannot be provided at a lesser intensity of care such as a SNF. 4. Patient has experienced substantial functional loss from his/her baseline which was documented above under the "Functional History" and "Functional Status" headings.  Judging by the patient's diagnosis, physical exam, and functional history, the patient has potential for functional progress which will result in measurable gains while on inpatient rehab.  These gains will be of substantial and practical use upon discharge  in facilitating mobility and self-care at the household level. 5. Physiatrist will provide 24 hour management of medical needs as well as oversight of the therapy plan/treatment and provide guidance as appropriate regarding the interaction of the two. 6. 24 hour rehab nursing will assist with bladder management, bowel management, safety, skin/wound care, disease management, medication administration, pain management and patient education  and help integrate therapy concepts, techniques,education, etc. 7. PT will assess and treat for:  Pre gait,gait, endurance equipment safety.  Goals are: Sup mobility. 8. OT will assess and treat for: ADL,Cog/perceptual, endurance safety equipment.   Goals are: supervision ADLs and mobility. 9. SLP will assess and treat  for: Aphasia,apraxia.  Goals are: express basic needs. 10. Case Management and Social Worker will assess and treat for psychological issues and discharge planning. 11. Team conference will be held weekly to assess progress toward goals and to determine barriers to discharge. 12.  Patient will receive at least 3 hours of therapy per day at least  5 days per week. 13. ELOS and Prognosis: 2 weeks good   Medical Problem List and Plan: 1. frontal subdural hemorrhage. Status post evacuation of hematoma 02/25/2012 2. DVT Prophylaxis/Anticoagulation: Support hose. Monitor for DVT 3. Pain Management: Norco as needed . Monitor mental status 4. seizure prophylaxis. Keppra 500 mg every 12 hours. Monitor for any seizure activity 5. 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 Patient independent prior to admission 6. Hypertension with history of bradycardia. Followup cardiology services as needed. Currently on no antihypertensive medications. Patient on Cozaar 50 mg daily, Lopressor 37.5 mg twice daily prior to admission. Plan to resume beta blocker 12.5 mg twice daily and titrate as needed if heart rate and blood pressure remained controlled 7. Diabetes mellitus. No current diabetic agents. Patient on Glucophage 500 mg daily prior to admission. Check CBGs a.c. and at bedtime 8. Depression. Zoloft 150 mg daily. Provide emotional support and positive reinforcement  Savyon Loken E 03/01/2012, 10:12 PM

## 2012-03-01 NOTE — PMR Pre-admission (Signed)
PMR Admission Coordinator Pre-Admission Assessment  Patient: Alicia Crawford is an 56 y.o., female MRN: [id number] DOB: [birth year] Height: 5\' 7"  (170.2 cm) Weight: 91.5 kg (201 lb 11.5 oz)  Insurance Information HMO:     PPO: yes     PCP:      IPA:      80/20:      OTHER:  PRIMARY: [employer]      Policy#: [id number]      Subscriber: spouse CM Name: Tilford Pillar     Phone#: [phone] Ext [phone]     Fax#: [phone] Pre-Cert#: [id number] cert for 7 days      Employer:fulltime self employed Benefits:  Phone #: [phone]     Name: Joni Reining [birth year]/ Eff. Date:10/04/07 active     Deduct: $1500/ met      Out of Pocket Max: $3000 not include deductible / met $1820.12      Life Max: unlimited CIR: 70 %      SNF: 70% 75 days max Outpatient: 70%     Co-Pay: 30% 20 visit limit per discipline Home Health: 70%      Co-Pay: 30% 30 visits DME: 70%     Co-Pay: 30% Providers: pt choice  SECONDARY: none      Policy#:       Subscriber:  CM Name:       Phone#:      Fax#:  Pre-Cert#:       Employer:  Benefits:  Phone #:      Name:  Eff. Date:      Deduct:       Out of Pocket Max:       Life Max:  CIR:       SNF:  Outpatient:      Co-Pay:  Home Health:       Co-Pay:  DME:      Co-Pay:   Medicaid Application Date:       Case Manager:  Disability Application Date:       Case Worker:   Emergency Contact Information Contact Information    Name Relation Home Work Mobile   Aloia,John Spouse  [phone]     Current Medical History  Patient Admitting Diagnosis: traumatic Brain Injury, History of ANoxic Brain Injury  History of Present Illness: 56 year old right-handed female with noted history of anoxic brain injury November 2011 with cardiac arrest, diabetes mellitus and dementia. Admitted 02/25/2012 after she fell backwards on a wet ramp and struck the back of her head. She denied loss of consciousness. Patient noted headache with nausea and vomiting. CT of the  head showed a large subdural hematoma. Underwent left craniotomy for evacuation of subdural hematoma 02/25/2012 per Dr. Marikay Alar. Placed on Keppra for seizure prophylaxis. Postoperatively with aphasia/ agitation and delirium. Occupational therapy evaluation and speech therapy completed 02/28/2012 with recommendations of physical medicine rehabilitation consult and consider inpatient rehabilitation services  Patient was very high functioning after her prior anoxic brain injury. She was driving, walking the dogs, totally independent.   Past Medical History  Past Medical History  Diagnosis Date  . Myocardial infarction   . Hypertension   . Leg pain   . Anoxic brain injury 08/2010    "w/cardiac arrest"  . Rotator cuff tendonitis   . Complication of anesthesia     [place]  . High cholesterol   . Cardiac arrest 08/04/2010  . Type II diabetes mellitus   . Blood transfusion 09/2010  . Depression   . DEMENTIA   .  Coronary artery disease     Family History  family history includes Alcohol abuse in her other; Diabetes in her other; Heart disease in her father, mother, other, and sister; Hypertension in her other; and Mental illness in her other.  Prior Rehab/Hospitalizations: CIR after anoxic brain injury 09/2011 then outpatient rehab her in [facility]  Current Medications  Current facility-administered medications:0.9 %  sodium chloride infusion, , Intravenous, Continuous, Tia Alert, MD, Last Rate: 75 mL/hr at 03/01/12 0700;  acetaminophen (TYLENOL) suppository 650 mg, 650 mg, Rectal, Q4H PRN, Tia Alert, MD;  acetaminophen (TYLENOL) tablet 650 mg, 650 mg, Oral, Q4H PRN, Tia Alert, MD;  dextrose 10 % infusion, , Intravenous, Continuous PRN, Vilinda Blanks Minor, NP haloperidol lactate (HALDOL) injection 1-4 mg, 1-4 mg, Intravenous, Q3H PRN, Vilinda Blanks Minor, NP, 3 mg at 02/29/12 2324;  hydrALAZINE (APRESOLINE) injection 10-20 mg, 10-20 mg, Intravenous, Q6H PRN, Tia Alert, MD;  insulin aspart (novoLOG) injection 0-4 Units, 0-4 Units, Subcutaneous, Q4H, Vilinda Blanks Minor, NP, 3 Units at 03/01/12 1211 levETIRAcetam (KEPPRA) 500 mg in sodium chloride 0.9 % 100 mL IVPB, 500 mg, Intravenous, Q12H, Tia Alert, MD, 500 mg at 03/01/12 1108;  morphine 2 MG/ML injection 1-4 mg, 1-4 mg, Intravenous, Q1H PRN, Tia Alert, MD, 2 mg at 03/01/12 0926;  mulitivitamin with minerals tablet 1 tablet, 1 tablet, Oral, Daily, Tia Alert, MD, 1 tablet at 03/01/12 0926 ondansetron [facility]) injection 4 mg, 4 mg, Intravenous, Q6H PRN, Tia Alert, MD, 4 mg at 02/29/12 1526;  pantoprazole (PROTONIX) injection 40 mg, 40 mg, Intravenous, QHS, Tia Alert, MD, 40 mg at 02/29/12 2208;  pneumococcal 23 valent vaccine (PNU-IMMUNE) injection 0.5 mL, 0.5 mL, Intramuscular, Tomorrow-1000, Tia Alert, MD;  senna-docusate (Senokot-S) tablet 1 tablet, 1 tablet, Oral, BID, Tia Alert, MD, 1 tablet at 03/01/12 1610 sertraline (ZOLOFT) tablet 150 mg, 150 mg, Oral, Daily, Tia Alert, MD, 150 mg at 03/01/12 9604;  DISCONTD: hydrALAZINE (APRESOLINE) injection 10-20 mg, 10-20 mg, Intravenous, Q6H PRN, Leslye Peer, MD, 10 mg at 02/29/12 1346  Patients Current Diet: General  Precautions / Restrictions Precautions Precautions: Fall Restrictions Weight Bearing Restrictions: No   Prior Activity Level Community (5-7x/wk): active pta since last summer and independent  Home Assistive Devices / Equipment Home Assistive Devices/Equipment: Cane (specify quad or straight);Shower chair with back;Walker (specify type) Home Adaptive Equipment: Shower chair with back;Walker - rolling;Straight cane  Prior Functional Level Prior Function Level of Independence: Independent Able to Take Stairs?: Yes Driving: Yes Vocation:  (has not worked since her Anoxic BI)  Current Functional Level Cognition  Arousal/Alertness: Lethargic Overall Cognitive Status: Impaired Overall Cognitive Status:  Impaired Difficult to assess due to: Impaired communication Current Attention Level: Selective Attention - Other Comments: x 3 mins Orientation Level: Oriented to person;Disoriented to place;Disoriented to time;Disoriented to situation Following Commands: Follows one step commands consistently (with gestures and physical cues) Safety/Judgement: Decreased awareness of safety precautions;Decreased safety judgement for tasks assessed;Impulsive Awareness of Errors: Assistance required to correct errors made Cognition - Other Comments: Pt. with significant communication deficits and therefore, cognition unable to be fully and accurately assesed Attention: Focused Memory: Impaired Awareness: Impaired Problem Solving: Impaired Behaviors: Restless Safety/Judgment: Impaired    Extremity Assessment (includes Sensation/Coordination)  RUE ROM/Strength/Tone: Unable to fully assess;Due to impaired cognition  RLE ROM/Strength/Tone: Within functional levels    ADLs  Grooming: Performed;Teeth care;Minimal assistance;Brushing hair Where Assessed - Grooming: Unsupported standing Lower Body Dressing:  Performed;Minimal assistance (don socks) Where Assessed - Lower Body Dressing: Supine, head of bed up;Unsupported sitting ((circle sitting)) Toilet Transfer: Performed;Moderate assistance Toilet Transfer Method: Sit to stand (ambulating to BR) Toilet Transfer Equipment: Comfort height toilet Toileting - Clothing Manipulation and Hygiene: Performed;Moderate assistance Where Assessed - Toileting Clothing Manipulation and Hygiene: Standing Equipment Used: Rolling walker Transfers/Ambulation Related to ADLs: Pt. requires min A to ambulate in room. Pt. very impulsive.  Required mod A for toilet transfer due to difficulty manipulating RW in small environment ADL Comments: Pt very impulsive, follows one step commands with gestures.  Poor safety awareness    Mobility  Bed Mobility: Supine to Sit;Sit to  Supine;Sitting - Scoot to Edge of Bed Supine to Sit: 5: Supervision;HOB elevated Sitting - Scoot to Edge of Bed: 5: Supervision Sit to Supine: 5: Supervision;HOB flat    Transfers  Transfers: Sit to Stand;Stand to Sit Sit to Stand: 4: Min assist;With armrests;From bed;From chair/3-in-1;From toilet Stand to Sit: 4: Min assist;With armrests;To chair/3-in-1;With upper extremity assist;To toilet Stand Pivot Transfers: 4: Min assist;With armrests    Ambulation / Gait / Stairs / Wheelchair Mobility  Ambulation/Gait Ambulation/Gait Assistance: 4: Min assist Ambulation/Gait: Patient Percentage: 70% Ambulation Distance (Feet): 200 Feet Assistive device: Rolling walker Ambulation/Gait Assistance Details: Patient very ataxic with ambulation today. A for balance. Cues and A with RW management and proximity of RW to body Gait Pattern: Step-through pattern;Decreased stride length;Decreased hip/knee flexion - right;Decreased dorsiflexion - right;Decreased hip/knee flexion - left;Decreased dorsiflexion - left Gait velocity: decreased Stairs: No Wheelchair Mobility Wheelchair Mobility: No    Posture / Balance       Previous Home Environment Lives With: Spouse Available Help at Discharge: Family;Available 24 hours/day Type of Home: House Home Layout: Two level;Able to live on main level with bedroom/bathroom Alternate Level Stairs-Rails: Can reach both Alternate Level Stairs-Number of Steps: 13 Home Access: Stairs to enter Entrance Stairs-Rails: None Entrance Stairs-Number of Steps: 4 Bathroom Shower/Tub: Psychologist, counselling;Door [employer] Toilet: Standard Bathroom Accessibility: Yes How Accessible: Accessible via walker Home Care Services: No  Discharge Living Setting Plans for Discharge Living Setting: Patient's home;Lives with (comment) (spouse) Type of Home at Discharge: House Discharge Home Layout: Two level;Able to live on main level with bedroom/bathroom Alternate Level Stairs-Rails:  Can reach both Alternate Level Stairs-Number of Steps: 13 steps Discharge Home Access: Stairs to enter Entrance Stairs-Rails: Can reach both Entrance Stairs-Number of Steps: 4 steps Discharge Bathroom Shower/Tub: Walk-in shower Discharge Bathroom Toilet: Standard Discharge Bathroom Accessibility: Yes How Accessible: Accessible via walker Do you have any problems obtaining your medications?: No  Social/Family/Support Systems Patient Roles: Spouse;Parent Contact Information: Allena Earing, spouse Anticipated Caregiver: spouse, daughter, Osa Craver and sister, Lurena Joiner Anticipated Industrial/product designer Information: work [phone]; cell [phone] Ability/Limitations of Caregiver: spouse works. out of town 6/2 for one week. Daughter in town, [place] Caregiver Availability: 24/7 Discharge Plan Discussed with Primary Caregiver: Yes Is Caregiver In Agreement with Plan?: Yes Does Caregiver/Family have Issues with Lodging/Transportation while Pt is in Rehab?: No  Goals/Additional Needs Patient/Family Goal for Rehab: supervision PT, supervision OT, supervision SLP Expected length of stay: ELOS 2 to 3 weeks Special Service Needs: Anoxic BI after cardiac arrest in [facility] 08/2010. Had 24/7 supervision by family until 04/2011 and then Independent until PTA Additional Information: patient received 2 weeks CIR in [place] before returning home and doing outpatient. Saw Riley Kill last time 05/24/12 Pt/Family Agrees to Admission and willing to participate: Yes Program Orientation Provided & Reviewed with Pt/Caregiver Including Roles  &  Responsibilities: Yes  Patient Condition: This patient's condition remains as documented in the Consult dated 02/29/12, in which the Rehabilitation Physician determined and documented that the patient's condition is appropriate for intensive rehabilitative care in an inpatient rehabilitation facility.  Preadmission Screen Completed By:  Clois Dupes, 03/01/2012 3:34  PM ______________________________________________________________________   Discussed status with Dr. Wynn Banker on 03/01/12 at  1535 and received telephone approval for admission today.  Admission Coordinator:  Clois Dupes, time 7829 Date 03/01/12

## 2012-03-01 NOTE — Progress Notes (Signed)
Occupational Therapy Treatment Patient Details Name: Linzy Laury MRN: [id number] DOB: 1956-07-08 Today's Date: 03/01/2012 Time: 5621-3086 OT Time Calculation (min): 27 min  OT Assessment / Plan / Recommendation Comments on Treatment Session Pt. demonstrates improving ability to perform BADLs.  Pt continues with profound language deficits, impulsivity and poor safety awareness.  She is beginning to perform familiar, routine self care activities, but requirs cues and assist.  Recommend CIR    Follow Up Recommendations  Inpatient Rehab    Barriers to Discharge       Equipment Recommendations  Defer to next venue    Recommendations for Other Services Rehab consult  Frequency Min 2X/week   Plan Discharge plan remains appropriate    Precautions / Restrictions Precautions Precautions: Fall Restrictions Weight Bearing Restrictions: No   Pertinent Vitals/Pain     ADL  Grooming: Performed;Teeth care;Minimal assistance;Brushing hair Where Assessed - Grooming: Unsupported standing Toilet Transfer: Performed;Moderate assistance Toilet Transfer Method: Sit to stand (ambulating to BR) Toilet Transfer Equipment: Comfort height toilet Toileting - Clothing Manipulation and Hygiene: Performed;Moderate assistance Where Assessed - Toileting Clothing Manipulation and Hygiene: Standing Equipment Used: Rolling walker Transfers/Ambulation Related to ADLs: Pt. requires min A to ambulate in room. Pt. very impulsive.  Required mod A for toilet transfer due to difficulty manipulating RW in small environment ADL Comments: Pt very impulsive, follows one step commands with gestures.  Poor safety awareness    OT Diagnosis:    OT Problem List:   OT Treatment Interventions:     OT Goals ADL Goals ADL Goal: Grooming - Progress: Progressing toward goals ADL Goal: Toilet Transfer - Progress: Progressing toward goals  Visit Information  Last OT Received On: 03/01/12 Assistance Needed: +1      Subjective Data      Prior Functioning       Cognition  Overall Cognitive Status: Impaired Area of Impairment: Attention;Safety/judgement;Awareness of deficits;Awareness of errors Difficult to assess due to: Impaired communication Arousal/Alertness: Lethargic Orientation Level: Disoriented X4;Situation;Time;Place Behavior During Session: Lethargic Current Attention Level: Selective Attention - Other Comments: x 3 mins Following Commands: Follows one step commands consistently (with gestures and physical cues) Safety/Judgement: Decreased awareness of safety precautions;Decreased safety judgement for tasks assessed;Impulsive Awareness of Errors: Assistance required to correct errors made Cognition - Other Comments: Pt. with significant communication deficits and therefore, cognition unable to be fully and accurately assesed    Mobility Bed Mobility Bed Mobility: Supine to Sit;Sit to Supine;Sitting - Scoot to Edge of Bed Supine to Sit: 5: Supervision;HOB elevated Sitting - Scoot to Edge of Bed: 5: Supervision Sit to Supine: 5: Supervision;HOB flat Transfers Transfers: Sit to Stand;Stand to Sit Sit to Stand: 4: Min assist;With armrests;From bed;From chair/3-in-1;From toilet Stand to Sit: 4: Min assist;With armrests;To chair/3-in-1;With upper extremity assist;To toilet Details for Transfer Assistance: Patient stood x4 times total. Cues for safe technique. A to control descent onto sitting surface   Exercises    Balance    End of Session OT - End of Session Activity Tolerance: Patient limited by fatigue Patient left: in bed;with call bell/phone within reach (sitter present) Nurse Communication: Mobility status   Ashawnti Tangen, Ursula Alert M 03/01/2012, 2:34 PM

## 2012-03-01 NOTE — Discharge Summary (Signed)
Physician Discharge Summary  Patient ID: Alicia Crawford MRN: [id number] DOB/AGE: [birth year] 58 y.o.  Admit date: 02/25/2012 Discharge date: 03/01/2012  Admission Diagnoses: Left acute subdural hematoma    Discharge Diagnoses: Same   Discharged Condition: good  Hospital Course: The patient was admitted on 02/25/2012 and taken to the operating room where the patient underwent left craniotomy for evacuation of a left acute subdural hematoma. The patient tolerated the procedure well and was taken to the recovery room and then to the ICU in stable condition. The hospital course was dictated by postoperative brain contusion with aphasia. The wound remained clean dry and intact. Pt's a fascia continued to slowly improve through her hospital stay to the point that her repetition, hypertension, and fluids he improved prior to discharge to rehabilitation. The patient remained afebrile with stable vital signs, and tolerated a regular diet. The patient continued to increase activities, and pain was well controlled with oral pain medications.   Consults: rehabilitation medicine  Significant Diagnostic Studies:  Results for orders placed during the hospital encounter of 02/25/12  CBC      Component Value Range   WBC 9.7  4.0 - 10.5 (K/uL)   RBC 3.79 (*) 3.87 - 5.11 (MIL/uL)   Hemoglobin 11.7 (*) 12.0 - 15.0 (g/dL)   HCT 40.9 (*) 81.1 - 46.0 (%)   MCV 91.3  78.0 - 100.0 (fL)   MCH 30.9  26.0 - 34.0 (pg)   MCHC 33.8  30.0 - 36.0 (g/dL)   RDW 91.4  78.2 - 95.6 (%)   Platelets 234  150 - 400 (K/uL)  PROTIME-INR      Component Value Range   Prothrombin Time 13.2  11.6 - 15.2 (seconds)   INR 0.98  0.00 - 1.49   BASIC METABOLIC PANEL      Component Value Range   Sodium 140  135 - 145 (mEq/L)   Potassium 4.4  3.5 - 5.1 (mEq/L)   Chloride 105  96 - 112 (mEq/L)   CO2 26  19 - 32 (mEq/L)   Glucose, Bld 152 (*) 70 - 99 (mg/dL)   BUN 17  6 - 23 (mg/dL)   Creatinine, Ser 2.13  0.50 - 1.10 (mg/dL)     Calcium 9.0  8.4 - 10.5 (mg/dL)   GFR calc non Af Amer >90  >90 (mL/min)   GFR calc Af Amer >90  >90 (mL/min)  GLUCOSE, CAPILLARY      Component Value Range   Glucose-Capillary 137 (*) 70 - 99 (mg/dL)  TYPE AND SCREEN      Component Value Range   ABO/RH(D) O POS     Antibody Screen NEG     Sample Expiration 02/28/2012    PLATELET FUNCTION ASSAY      Component Value Range   Collagen / Epinephrine 159  0 - 184 (seconds)   PFA Interpretation (NOTE)    ABO/RH      Component Value Range   ABO/RH(D) O POS    GLUCOSE, CAPILLARY      Component Value Range   Glucose-Capillary 192 (*) 70 - 99 (mg/dL)  MRSA PCR SCREENING      Component Value Range   MRSA by PCR NEGATIVE  NEGATIVE   GLUCOSE, CAPILLARY      Component Value Range   Glucose-Capillary 218 (*) 70 - 99 (mg/dL)   Comment 1 Documented in Chart     Comment 2 Notify RN    GLUCOSE, CAPILLARY      Component Value  Range   Glucose-Capillary 191 (*) 70 - 99 (mg/dL)  CARDIAC PANEL(CRET KIN+CKTOT+MB+TROPI)      Component Value Range   Total CK 174  7 - 177 (U/L)   CK, MB 3.5  0.3 - 4.0 (ng/mL)   Troponin I <0.30  <0.30 (ng/mL)   Relative Index 2.0  0.0 - 2.5   BASIC METABOLIC PANEL      Component Value Range   Sodium 142  135 - 145 (mEq/L)   Potassium 3.5  3.5 - 5.1 (mEq/L)   Chloride 107  96 - 112 (mEq/L)   CO2 26  19 - 32 (mEq/L)   Glucose, Bld 184 (*) 70 - 99 (mg/dL)   BUN 15  6 - 23 (mg/dL)   Creatinine, Ser 0.96  0.50 - 1.10 (mg/dL)   Calcium 8.7  8.4 - 04.5 (mg/dL)   GFR calc non Af Amer >90  >90 (mL/min)   GFR calc Af Amer >90  >90 (mL/min)  BLOOD GAS, ARTERIAL      Component Value Range   FIO2 0.50     Delivery systems VENTURI MASK     pH, Arterial 7.327 (*) 7.350 - 7.400    pCO2 arterial 51.5 (*) 35.0 - 45.0 (mmHg)   pO2, Arterial 110.0 (*) 80.0 - 100.0 (mmHg)   Bicarbonate 26.2 (*) 20.0 - 24.0 (mEq/L)   TCO2 27.8  0 - 100 (mmol/L)   Acid-Base Excess 0.9  0.0 - 2.0 (mmol/L)   O2 Saturation 98.3     Patient  temperature 98.6     Collection site RADIOLOGY     Drawn by [id number]     Sample type ARTERIAL     Allens test (pass/fail) PASS  PASS   PRO B NATRIURETIC PEPTIDE      Component Value Range   Pro B Natriuretic peptide (BNP) 1016.0 (*) 0 - 125 (pg/mL)  GLUCOSE, CAPILLARY      Component Value Range   Glucose-Capillary 205 (*) 70 - 99 (mg/dL)  GLUCOSE, CAPILLARY      Component Value Range   Glucose-Capillary 186 (*) 70 - 99 (mg/dL)  GLUCOSE, CAPILLARY      Component Value Range   Glucose-Capillary 170 (*) 70 - 99 (mg/dL)   Comment 1 Notify RN    GLUCOSE, CAPILLARY      Component Value Range   Glucose-Capillary 191 (*) 70 - 99 (mg/dL)   Comment 1 Notify RN    GLUCOSE, CAPILLARY      Component Value Range   Glucose-Capillary 138 (*) 70 - 99 (mg/dL)   Comment 1 Notify RN    GLUCOSE, CAPILLARY      Component Value Range   Glucose-Capillary 149 (*) 70 - 99 (mg/dL)  CBC      Component Value Range   WBC 10.3  4.0 - 10.5 (K/uL)   RBC 3.11 (*) 3.87 - 5.11 (MIL/uL)   Hemoglobin 9.4 (*) 12.0 - 15.0 (g/dL)   HCT 91.4 (*) 78.2 - 46.0 (%)   MCV 93.2  78.0 - 100.0 (fL)   MCH 30.2  26.0 - 34.0 (pg)   MCHC 32.4  30.0 - 36.0 (g/dL)   RDW 95.6  21.3 - 08.6 (%)   Platelets 208  150 - 400 (K/uL)  BASIC METABOLIC PANEL      Component Value Range   Sodium 143  135 - 145 (mEq/L)   Potassium 3.5  3.5 - 5.1 (mEq/L)   Chloride 109  96 - 112 (mEq/L)   CO2 27  19 - 32 (mEq/L)   Glucose, Bld 124 (*) 70 - 99 (mg/dL)   BUN 13  6 - 23 (mg/dL)   Creatinine, Ser 8.65  0.50 - 1.10 (mg/dL)   Calcium 8.2 (*) 8.4 - 10.5 (mg/dL)   GFR calc non Af Amer >90  >90 (mL/min)   GFR calc Af Amer >90  >90 (mL/min)  MAGNESIUM      Component Value Range   Magnesium 2.0  1.5 - 2.5 (mg/dL)  PHOSPHORUS      Component Value Range   Phosphorus 1.9 (*) 2.3 - 4.6 (mg/dL)  GLUCOSE, CAPILLARY      Component Value Range   Glucose-Capillary 116 (*) 70 - 99 (mg/dL)  GLUCOSE, CAPILLARY      Component Value Range    Glucose-Capillary 116 (*) 70 - 99 (mg/dL)  GLUCOSE, CAPILLARY      Component Value Range   Glucose-Capillary 127 (*) 70 - 99 (mg/dL)  GLUCOSE, CAPILLARY      Component Value Range   Glucose-Capillary 133 (*) 70 - 99 (mg/dL)  GLUCOSE, CAPILLARY      Component Value Range   Glucose-Capillary 161 (*) 70 - 99 (mg/dL)  GLUCOSE, CAPILLARY      Component Value Range   Glucose-Capillary 153 (*) 70 - 99 (mg/dL)   Comment 1 Notify RN     Comment 2 Documented in Chart    GLUCOSE, CAPILLARY      Component Value Range   Glucose-Capillary 196 (*) 70 - 99 (mg/dL)   Comment 1 Documented in Chart     Comment 2 Notify RN    GLUCOSE, CAPILLARY      Component Value Range   Glucose-Capillary 176 (*) 70 - 99 (mg/dL)   Comment 1 Notify RN    GLUCOSE, CAPILLARY      Component Value Range   Glucose-Capillary 132 (*) 70 - 99 (mg/dL)   Comment 1 Notify RN    GLUCOSE, CAPILLARY      Component Value Range   Glucose-Capillary 158 (*) 70 - 99 (mg/dL)  GLUCOSE, CAPILLARY      Component Value Range   Glucose-Capillary 152 (*) 70 - 99 (mg/dL)  GLUCOSE, CAPILLARY      Component Value Range   Glucose-Capillary 156 (*) 70 - 99 (mg/dL)  GLUCOSE, CAPILLARY      Component Value Range   Glucose-Capillary 184 (*) 70 - 99 (mg/dL)   Comment 1 Notify RN    GLUCOSE, CAPILLARY      Component Value Range   Glucose-Capillary 139 (*) 70 - 99 (mg/dL)   Comment 1 Notify RN    GLUCOSE, CAPILLARY      Component Value Range   Glucose-Capillary 144 (*) 70 - 99 (mg/dL)   Comment 1 Notify RN    GLUCOSE, CAPILLARY      Component Value Range   Glucose-Capillary 159 (*) 70 - 99 (mg/dL)    Ct Head Wo Contrast  02/27/2012  *RADIOLOGY REPORT*  Clinical Data: Subdural hematoma.  CT HEAD WITHOUT CONTRAST  Technique:  Contiguous axial images were obtained from the base of the skull through the vertex without contrast.  Comparison: 05/26 and 02/25/2012  Findings: There has been no change in the appearance of the brain since  prior study.  Diffuse subarachnoid hemorrhage, left posterior temporal parenchymal hematoma, and 2 mm of midline shift from left to right all appear stable.  Subdural hematoma has been evacuated. Drain and air is seen in the anterior left subdural space.  Left  craniotomy is noted and appears unchanged.  IMPRESSION: No change in the appearance of brain since prior study.  Stable subarachnoid and parenchymal hemorrhage.  No recurrent subdural hematoma.  Stable 2 mm midline shift.  Original Report Authenticated By: Gwynn Burly, M.D.   Ct Head Wo Contrast  02/26/2012  *RADIOLOGY REPORT*  Clinical Data: Follow up subdural hematoma  CT HEAD WITHOUT CONTRAST  Technique:  Contiguous axial images were obtained from the base of the skull through the vertex without contrast.  Comparison: 02/25/2012  Findings: Motion degraded images.  Interval left frontal craniotomy with evacuation of subdural hematoma and placement of a surgical drain.  Associated gas overlying the left frontal convexity.  1.6 x 2.7 cm parenchymal hemorrhage/contusion in the left temporal lobe (series 3/image 11), new.  Surrounding vasogenic edema.  New diffuse subarachnoid hemorrhage, most prominent in the left sylvian fissure.  Small amount of intraventricular hemorrhage is suspected in the occipital horn of the left lateral ventricle (series 3/image 14). Ventricles are mildly prominent.  Near complete resolution of prior rightward midline shift, now measuring 2 mm.  The visualized paranasal sinuses are essentially clear. The mastoid air cells are unopacified.  No evidence of calvarial fracture.  IMPRESSION: Interval left frontal craniotomy with evacuation of subdural hematoma and placement of a surgical drain.  Near complete resolution of prior rightward midline shift, now measuring 2 mm.  1.6 x 2.7 cm parenchymal hemorrhage/contusion in the left temporal lobe, new.  Surrounding vasogenic edema.  New diffuse subarachnoid hemorrhage.  Suspected  small amount of intraventricular hemorrhage.  These results were called by telephone on 02/26/2012  at  0725 hours to  Ferman Hamming, the nurse caring for the patient, who verbally acknowledged these results.  Original Report Authenticated By: Charline Bills, M.D.   Ct Head Wo Contrast  02/25/2012  *RADIOLOGY REPORT*  Clinical Data: Fall, hit back of head, headaches  CT HEAD WITHOUT CONTRAST  Technique:  Contiguous axial images were obtained from the base of the skull through the vertex without contrast.  Comparison: None.  Findings: Subdural hematoma overlying the left frontal convexity measuring up to 1.8 cm in thickness (series 2/image 18). Hemorrhage overlies the temporal lobe (series 2/image 7) and is also along the left falx (series 2/image 22).  Underlying mass effect with sulcal effacement.  10 mm rightward midline shift (series 2/image 12).  Mass effect on the posterior and temporal horns of the left lateral ventricle.  The basal cisterns remain patent.  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 Intracranial atherosclerosis.  Cerebral volume is age appropriate.  No ventriculomegaly.  No intraventricular hemorrhage.  The visualized paranasal sinuses are essentially clear. The mastoid air cells are unopacified.  No evidence of calvarial fracture.  IMPRESSION: Subdural hematoma overlying the left frontal convexity measuring up to 1.8 cm in thickness, as described above.  Underlying mass effect with sulcal effacement and 10 mm rightward midline shift.  The basal cisterns remain patent.  Critical Value/emergent results were called by telephone at the time of interpretation on 02/25/2012  at 1730 hours  to  Dr. Radford Pax, who verbally acknowledged these results.  Original Report Authenticated By: Charline Bills, M.D.   Dg Chest Port 1 View  02/28/2012  *RADIOLOGY REPORT*  Clinical Data: Evaluate for atelectasis.  PORTABLE CHEST - 1 VIEW  Comparison: Chest x-ray 01/11/2012.   Findings: Lung volumes are low.  There are some minimal bibasilar predominately linear opacities favored to represent areas of subsegmental atelectasis.  No definite focal  airspace consolidation.  No pleural effusions.  Pulmonary venous congestion without frank pulmonary edema.  Heart size is borderline enlarged, likely accentuated by low lung volumes and rotation. The patient is rotated to the right on today's exam, resulting in distortion of the mediastinal contours and reduced diagnostic sensitivity and specificity for mediastinal pathology.  Atherosclerosis in the thoracic aorta. The patient is status post median sternotomyfor CABG with a LIMA.  IMPRESSION: 1.  Low lung volumes with bibasilar subsegmental atelectasis. 2.  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 3.  Atherosclerosis.  Original Report Authenticated By: Florencia Reasons, M.D.    Antibiotics:  Anti-infectives     Start     Dose/Rate Route Frequency Ordered Stop   02/25/12 1952   bacitracin 50,000 Units in sodium chloride irrigation 0.9 % 500 mL irrigation  Status:  Discontinued          As needed 02/25/12 1953 02/25/12 2111          Discharge Exam: Blood pressure 160/56, pulse 59, temperature 99.3 F (37.4 C), temperature source Oral, resp. rate 32, height 5\' 7"  (1.702 m), weight 91.5 kg (201 lb 11.5 oz), SpO2 96.00%. Neurologic: Mental status: Alert, oriented, thought content appropriate, Aphasia improving Motor: Normal Incision okay  Discharge Medications:   Medication List  As of 03/01/2012  9:56 AM   TAKE these medications         ACETYLCARNITINE HCL PO   Take 1 capsule by mouth daily.      aspirin EC 325 MG tablet   Take 162.5 mg by mouth daily. TAKE 1/2 DAILY      atorvastatin 80 MG tablet   Commonly known as: LIPITOR   Take 80 mg by mouth daily.      cilostazol 100 MG tablet   Commonly known as: PLETAL   Take 100 mg by mouth 2 (two) times daily.      EQL COQ10 300 MG Caps   Generic drug:  Coenzyme Q10   Take 1 capsule by mouth daily.      IBUPROFEN PM PO   Take 2 tablets by mouth at bedtime.      LECITHIN PO   Take 1,200 mg by mouth daily.      LORazepam 1 MG tablet   Commonly known as: ATIVAN   Take 1 mg by mouth at bedtime.      losartan 50 MG tablet   Commonly known as: COZAAR   Take 50 mg by mouth daily.      metFORMIN 500 MG tablet   Commonly known as: GLUCOPHAGE   Take 500 mg by mouth daily.      metoprolol tartrate 25 MG tablet   Commonly known as: LOPRESSOR   Take 37.5 mg by mouth 2 (two) times daily.      mulitivitamin with minerals Tabs   Take 1 tablet by mouth daily.      OVER THE COUNTER MEDICATION   Take 1 capsule by mouth daily. Mega Red      pantoprazole 40 MG tablet   Commonly known as: PROTONIX   Take 40 mg by mouth daily.      polycarbophil 625 MG tablet   Commonly known as: FIBERCON   Take 625 mg by mouth daily.      sertraline 50 MG tablet   Commonly known as: ZOLOFT   Take 150 mg by mouth daily.            Disposition: Craniotomy for left acute  subdural hematoma  Final Dx: Rehabilitation  Discharge Orders    Future Appointments: Provider: Department: Dept Phone: Center:   05/07/2012 1:00 PM [facility] [phone] [facility]   05/07/2012 2:00 PM Nada Libman, MD [facility] [phone] [facility]         Signed: Tia Alert 03/01/2012, 9:56 AM

## 2012-03-01 NOTE — Progress Notes (Signed)
I have spoken with pt's spouse and he is agreeable to admit to inpt rehab when approved by insurance company. I have initiated approval and am hopeful to admit today. [phone] with questions.

## 2012-03-01 NOTE — Plan of Care (Signed)
Problem: Phase II Progression Outcomes Goal: Neuro exam at baseline or improved Outcome: Completed/Met Date Met:  03/01/12 improved

## 2012-03-01 NOTE — Progress Notes (Signed)
Patient ID: Alicia Crawford, female   DOB: 1955/11/19, 56 y.o.   MRN: [id number] Patient looks better this morning. Her 8 aphasia appears to be improved. She is now following commands with all 4 extremities. He can now repeat some words and state her first name. Her comprehension as well as repetition appears better. Still not overly fluent. I am encouraged by her progress. Her incision remained clean dry and intact. She moves all 4 extremities. She is awake and alert and more appropriate in her actions. Hopefully she will be accepted to rehabilitation. We will continue our therapy.

## 2012-03-02 DIAGNOSIS — S065XAA Traumatic subdural hemorrhage with loss of consciousness status unknown, initial encounter: Secondary | ICD-10-CM

## 2012-03-02 DIAGNOSIS — S065X9A Traumatic subdural hemorrhage with loss of consciousness of unspecified duration, initial encounter: Secondary | ICD-10-CM

## 2012-03-02 LAB — GLUCOSE, CAPILLARY
Glucose-Capillary: 164 mg/dL — ABNORMAL HIGH (ref 70–99)
Glucose-Capillary: 199 mg/dL — ABNORMAL HIGH (ref 70–99)

## 2012-03-02 LAB — COMPREHENSIVE METABOLIC PANEL
ALT: 19 U/L (ref 0–35)
Alkaline Phosphatase: 71 U/L (ref 39–117)
CO2: 27 mEq/L (ref 19–32)
GFR calc Af Amer: >90 mL/min (ref >90)
GFR calc non Af Amer: >90 mL/min (ref >90)
Glucose, Bld: 143 mg/dL — ABNORMAL HIGH (ref 70–99)
Potassium: 3.7 mEq/L (ref 3.5–5.1)
Sodium: 138 mEq/L (ref 135–145)

## 2012-03-02 LAB — CBC
Hemoglobin: 11.5 g/dL — ABNORMAL LOW (ref 12.0–15.0)
RBC: 3.78 MIL/uL — ABNORMAL LOW (ref 3.87–5.11)

## 2012-03-02 LAB — DIFFERENTIAL
Basophils Absolute: 0 10*3/uL (ref 0.0–0.1)
Eosinophils Absolute: 0.1 10*3/uL (ref 0.0–0.7)
Eosinophils Relative: 1 % (ref 0–5)

## 2012-03-02 MED ORDER — PANTOPRAZOLE SODIUM 40 MG PO TBEC
40.0000 mg | DELAYED_RELEASE_TABLET | Freq: Every day | ORAL | Status: DC
  Administered 2012-03-02 – 2012-03-15 (×14): 40 mg via ORAL
  Filled 2012-03-02 (×14): qty 1

## 2012-03-02 NOTE — Evaluation (Signed)
Speech Language Pathology Assessment and Plan & BSE  Patient Details  Name: Alicia Crawford MRN: [id number] Date of Birth: 04-Mar-1956  SLP Diagnosis: cognitive impairments, global aphasia  Rehab Potential: Good ELOS: 2 weeks   Today's Date: 03/02/2012 Time: 1300-1350 Time Calculation (min): 50 min  Skilled Therapeutic Intervention: Administered cognitive-linguistic evaluation. Please see below for details.   Problem List:  Patient Active Problem List  Diagnoses  . Peripheral artery disease, bilta LE PVD by angio April 2013  . HLD (hyperlipidemia)  . HTN (hypertension)  . Renal artery stenosis, Right, 75%  . Subdural hematoma  . S/P craniotomy, 02/25/12 after fall, SDH  . Bradycardia, she has had baseline bradycardia prior to admission  . Altered mental status  . Obesity  . Diabetes mellitus  . CAD, cardiac arrest, CPR, [facility]-NL LVF by Echo, neg Nuc April 2013  . Anxiety, ? residual hypoxic brain inhury from cardiac arrest 2011  . SDH (subdural hematoma)    Past Medical History:  Past Medical History  Diagnosis Date  . Myocardial infarction   . Hypertension   . Leg pain   . Anoxic brain injury 08/2010    "w/cardiac arrest"  . Rotator cuff tendonitis   . Complication of anesthesia     [place]  . High cholesterol   . Cardiac arrest 08/04/2010  . Type II diabetes mellitus   . Blood transfusion 09/2010  . Depression   . DEMENTIA   . Coronary artery disease    Past Surgical History:  Past Surgical History  Procedure Date  . Ovarian cyst removal 1983  . Stomach and skin tuck 2000  . Posterior fusion lumbar spine ` 2000  . Cholecystectomy ~ 1980's  . 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 Cardiac surgery   . Back surgery   . Craniotomy 02/25/2012    Procedure: CRANIOTOMY HEMATOMA EVACUATION SUBDURAL;  Surgeon: Tia Alert, MD;  Location: [facility];  Service: Neurosurgery;  Laterality: Left;  Left craniotomy for evacuation of  subdural hematoma    Assessment & Plan Clinical Impression: Patient is a 56 y.o. year old female with recent admission to the hospital on 02/25/2012 after she fell backwards on a wet ramp and struck the back of her head. She denied loss of consciousness. Patient noted headache with nausea and vomiting. CT of the head showed a large subdural hematoma. Underwent left craniotomy for evacuation of subdural hematoma 02/25/2012. Patient transferred to CIR on 03/01/2012 .   Patient currently demonstrates behaviors consistent with a [place] Level V (confused, inappropriate) and requires Max Assist for functional problem solving, sustained attention, intellectual awareness,  impulsivity and working memory which impacting pt's overall safety awareness. Pt also demonstrates global aphasia impacting all four modalities of language. Prior to hospitalization, patient was independent and lived at home with her husband.  Patient will benefit from skilled SLP intervention to maximize functional communication, cognitive function and overall independence to decrease caregiver burden for planned discharge home with 24 hour supervision. Anticipate patient will benefit from follow up OP at discharge.   SLP - End of Session Patient left: in bed;with call bell/phone within reach;with bed alarm set Nurse Communication: Aspiration precautions reviewed;Swallow strategies reviewed;Cognitive/Linguistic strategies reviewed;Diet recommendation Assessment SLP Recommendation/Assessment: Patient will need skilled Speech Lanaguage Pathology Services during CIR admission Rehab Potential: Good Therapy Diagnosis: Aphasia;Cognitive Impairments Type of Aphasia: Global SLP Plan SLP Frequency: 1-2 X/day, 30-60 minutes Estimated Length of Stay: 2 weeks  SLP Treatment/Interventions: Cognitive remediation/compensation;Cueing hierarchy;Functional tasks;Environmental controls;Internal/external aids;Speech/Language facilitation;Therapeutic  Activities;Patient/family education;Multimodal communication approach Recommendation Follow up Recommendations: Outpatient SLP Equipment Recommended: None recommended by SLP  SLP Evaluation Precautions/Restrictions  Precautions Precautions: Fall Precaution Comments: decreased safety awareness Restrictions Weight Bearing Restrictions: No Pain Pain Assessment Pain Assessment: 0-10 Pain Score: Asleep Faces Pain Scale: No hurt Prior Functioning Type of Home: House Lives With: Spouse Available Help at Discharge: Family Vocation: Retired IT consultant Overall Cognitive Status: Impaired Arousal/Alertness: Lethargic Orientation Level: Oriented to person;Oriented to time (Oriented to place and situation from field of 2 choices) Attention: Selective;Sustained Sustained Attention: Impaired Sustained Attention Impairment: Verbal basic;Functional basic Selective Attention: Impaired Memory: Impaired Memory Impairment: Decreased short term memory;Decreased recall of new information Decreased Short Term Memory: Verbal basic;Functional basic Awareness: Impaired Awareness Impairment: Intellectual impairment Problem Solving: Impaired Problem Solving Impairment: Verbal basic;Functional basic Executive Function: Reasoning;Sequencing;Organizing;Decision Making;Initiating;Self Monitoring;Self Correcting (all impaired) Reasoning: Impaired Reasoning Impairment: Verbal basic;Functional basic Sequencing: Impaired Sequencing Impairment: Verbal basic;Functional basic Organizing: Impaired Organizing Impairment: Verbal basic;Functional basic Decision Making Impairment: Verbal basic;Functional basic Initiating: Impaired Initiating Impairment: Verbal basic;Functional basic Self Monitoring: Impaired Self Monitoring Impairment: Verbal basic;Functional basic Self Correcting: Impaired Self Correcting Impairment: Verbal basic;Functional basic Behaviors: Impulsive Safety/Judgment: Impaired [employer] Scales of Cognitive Functioning: Confused/inappropriate/non-agitated Comprehension Auditory Comprehension Overall Auditory Comprehension: Impaired Yes/No Questions: Impaired Basic Biographical Questions: 51-75% accurate Basic Immediate Environment Questions: 50-74% accurate Commands: Impaired One Step Basic Commands: 75-100% accurate Two Step Basic Commands: 25-49% accurate Conversation: Simple Interfering Components: Attention;Processing speed EffectiveTechniques: Extra processing time;Visual/Gestural cues;Repetition Visual Recognition/Discrimination Discrimination: Within Function Limits Reading Comprehension Reading Status: Impaired Word level: Within functional limits Sentence Level: Within functional limits Paragraph Level: Impaired Functional Environmental (signs, name badge): Within functional limits Interfering Components: Attention;Processing time Expression Expression Primary Mode of Expression: Verbal Verbal Expression Overall Verbal Expression: Impaired Initiation: Impaired Automatic Speech: Name;Social Response;Month of year;Day of week;Counting Level of Generative/Spontaneous Verbalization: Phrase;Word;Sentence Naming: Impairment Responsive: 0-25% accurate (accuracy improved when given choices from field of 2 ) Confrontation: Within functional limits (basic items) Convergent: Not tested Divergent: Not tested Verbal Errors: Aware of errors Interfering Components: Attention Written Expression Dominant Hand: Right Written Expression: Exceptions to [facility] Self Formulation Ability: Word ( could not correct spelling errors ) Interfering Components: Attention;Thought organization Oral/Motor Oral Motor/Sensory Function Overall Oral Motor/Sensory Function: Appears within functional limits for tasks assessed Motor Speech Overall Motor Speech: Appears within functional limits for tasks assessed  See FIM for current functional status Refer to Care Plan for Long  Term Goals  Recommendations for other services: None  Discharge Criteria: Patient will be discharged from SLP if patient refuses treatment 3 consecutive times without medical reason, if treatment goals not met, if there is a change in medical status, if patient makes no progress towards goals or if patient is discharged from hospital.  The above assessment, treatment plan, treatment alternatives and goals were discussed and mutually agreed upon: by patient  Jizelle Conkey 03/02/2012, 4:20 PM  . Clinical/Bedside Swallow Evaluation Patient Details  Name: Alicia Crawford MRN: [id number] DOB: 1956-05-13  Today's Date: 03/02/2012 Time: 1300-1350 Time Calculation (min): 50 min  Past Medical History:  Past Medical History  Diagnosis Date  . Myocardial infarction   . Hypertension   . Leg pain   . Anoxic brain injury 08/2010    "w/cardiac arrest"  . Rotator cuff tendonitis   . Complication of anesthesia     [place]  . High cholesterol   . Cardiac arrest 08/04/2010  . Type  II diabetes mellitus   . Blood transfusion 09/2010  . Depression   . DEMENTIA   . Coronary artery disease    Past Surgical History:  Past Surgical History  Procedure Date  . Ovarian cyst removal 1983  . Stomach and skin tuck 2000  . Posterior fusion lumbar spine ` 2000  . Cholecystectomy ~ 1980's  . 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 Cardiac surgery   . Back surgery   . Craniotomy 02/25/2012    Procedure: CRANIOTOMY HEMATOMA EVACUATION SUBDURAL;  Surgeon: Tia Alert, MD;  Location: [facility];  Service: Neurosurgery;  Laterality: Left;  Left craniotomy for evacuation of subdural hematoma     Assessment/Recommendations/Treatment Plan   SLP Assessment Clinical Impression Statement: Pt demonstrates a mild-moderate cognitive based dysphagia characterized by impulsivity requiring full supervision and verbal cueing for utilization of small bites and slow rate.  Pt demonstrated  efficient mastication with regular textures and did not demonstrate any overt s/s of aspiration with thin liquids.   Risk for Aspiration: Mild Other Related Risk Factors: Cognitive impairment;Lethargy  Swallow Evaluation Recommendations Diet Recommendations: Thin liquid;Regular Liquid Administration via: Cup Medication Administration: Whole meds with liquid Supervision: Patient able to self feed;Full supervision/cueing for compensatory strategies Compensations: Slow rate;Small sips/bites Postural Changes and/or Swallow Maneuvers: Out of bed for meals Oral Care Recommendations: Oral care BID Follow up Recommendations: Outpatient SLP  Treatment Plan Treatment Plan Recommendations: Therapy as outlined in treatment plan below Speech Therapy Frequency: min 5x/week Treatment Duration: 2 weeks Interventions: Aspiration precaution training;Compensatory techniques;Diet toleration management by SLP;Patient/family education;Group dysphagia treatment  Prognosis Prognosis for Safe Diet Advancement: Good  Individuals Consulted Consulted and Agree with Results and Recommendations: Patient   Swallow Study Prior Functional Status  Type of Home: House Lives With: Spouse Available Help at Discharge: Family Vocation: Retired  Oral Motor/Sensory Function  Overall Oral Motor/Sensory Function: Appears within functional limits for tasks assessed  Consistency Results  Ice Chips Ice chips: Not tested  Thin Liquid Thin Liquid: Within functional limits Presentation: Self Fed;Cup  Nectar Thick Liquid Nectar Thick Liquid: Not tested  Honey Thick Liquid Honey Thick Liquid: Not tested  Puree Puree: Not tested  Solid Solid: Within functional limits Presentation: Self Fed Other Comments: Pt required verbal cues to decrease impulsivity and take small bites and utilize a slow pace   Aser Nylund 03/02/2012,4:23 PM

## 2012-03-02 NOTE — Care Management (Signed)
[facility] Individual Statement of Services  Patient Name:  Alicia Crawford  Date:  03/02/2012  Welcome to the [facility].  Our goal is to provide you with an individualized program based on your diagnosis and situation, designed to meet your specific needs.  With this comprehensive rehabilitation program, you will be expected to participate in at least 3 hours of rehabilitation therapies Monday-Friday, with modified therapy programming on the weekends.  Your rehabilitation program will include the following services:  Physical Therapy (PT), Occupational Therapy (OT), Speech Therapy (ST), 24 hour per day rehabilitation nursing, Therapeutic Recreaction (TR), Case Management (RN and Child psychotherapist), Rehabilitation Medicine, Nutrition Services and Pharmacy Services  Weekly team conferences will be held on  Tuesday  to discuss your progress.  Your RN Case Designer, television/film set will talk with you frequently to get your input and to update you on team discussions.  Team conferences with you and your family in attendance may also be held.  Expected length of stay: 10-14 days  Overall anticipated outcome: Supervision-Min Assist  Depending on your progress and recovery, your program may change.  Your RN Case Estate agent will coordinate services and will keep you informed of any changes.  Your RN Sports coach and SW names and contact numbers are listed  below.  The following services may also be recommended but are not provided by the [facility]:   Driving Evaluations  Home Health Rehabiltiation Services  Outpatient Rehabilitatation [facility]  Vocational Rehabilitation   Arrangements will be made to provide these services after discharge if needed.  Arrangements include referral to agencies that provide these services.  Your insurance has been verified to be:  [employer] Your primary doctor is:    Pertinent  information will be shared with your doctor and your insurance company.  Case Manager: Melanee Spry, [facility] [phone]  Social Worker:  Amada Jupiter, [place] [phone]  Information discussed with and copy given to patient and husband by: Brock Ra, 03/02/2012, 2:54 PM

## 2012-03-02 NOTE — Progress Notes (Signed)
Patient ID: Alicia Crawford, female   DOB: Sep 10, 1956, 56 y.o.   MRN: [id number] Subjective/Complaints: 56 year old right-handed female with noted history of anoxic brain injury November 2011 with cardiac arrest, diabetes mellitus and dementia. Patient was very high functioning after prior anoxic brain injury. She was driving, walking the dogs and independent. Admitted 02/25/2012 after she fell backwards on a wet ramp and struck the back of her head. She denied loss of consciousness. Patient noted headache with nausea and vomiting. CT of the head showed a large subdural hematoma. Underwent left craniotomy for evacuation of subdural hematoma 02/25/2012 per Dr. Marikay Alar. Placed on Keppra for seizure prophylaxis. Postoperatively with aphasia/ agitation and delirium.   Objective: Vital Signs: Blood pressure 149/70, pulse 63, resp. rate 18, SpO2 94.00%. No results found. Results for orders placed during the hospital encounter of 03/01/12 (from the past 72 hour(s))  GLUCOSE, CAPILLARY     Status: Abnormal   Collection Time   03/02/12 12:07 AM      Component Value Range Comment   Glucose-Capillary 164 (*) 70 - 99 (mg/dL)    Comment 1 Notify RN     GLUCOSE, CAPILLARY     Status: Abnormal   Collection Time   03/02/12  5:12 AM      Component Value Range Comment   Glucose-Capillary 138 (*) 70 - 99 (mg/dL)    Comment 1 Notify RN     CBC     Status: Abnormal   Collection Time   03/02/12  6:35 AM      Component Value Range Comment   WBC 10.5  4.0 - 10.5 (K/uL)    RBC 3.78 (*) 3.87 - 5.11 (MIL/uL)    Hemoglobin 11.5 (*) 12.0 - 15.0 (g/dL)    HCT 46.9 (*) 62.9 - 46.0 (%)    MCV 90.7  78.0 - 100.0 (fL)    MCH 30.4  26.0 - 34.0 (pg)    MCHC 33.5  30.0 - 36.0 (g/dL)    RDW 52.8  41.3 - 24.4 (%)    Platelets 293  150 - 400 (K/uL)   DIFFERENTIAL     Status: Normal   Collection Time   03/02/12  6:35 AM      Component Value Range Comment   Neutrophils Relative 67  43 - 77 (%)    Neutro Abs 7.0  1.7 -  7.7 (K/uL)    Lymphocytes Relative 24  12 - 46 (%)    Lymphs Abs 2.5  0.7 - 4.0 (K/uL)    Monocytes Relative 8  3 - 12 (%)    Monocytes Absolute 0.8  0.1 - 1.0 (K/uL)    Eosinophils Relative 1  0 - 5 (%)    Eosinophils Absolute 0.1  0.0 - 0.7 (K/uL)    Basophils Relative 0  0 - 1 (%)    Basophils Absolute 0.0  0.0 - 0.1 (K/uL)   GLUCOSE, CAPILLARY     Status: Abnormal   Collection Time   03/02/12  7:24 AM      Component Value Range Comment   Glucose-Capillary 166 (*) 70 - 99 (mg/dL)    Comment 1 Notify RN        HEENT: normal Cardio: RRR Resp: CTA B/L GI: BS positive Extremity:  No Edema Skin:   Wound C/D/I Neuro: Abnormal Motor 4/5 on R 5/5 on L, Abnormal FMC Ataxic/ dec FMC, Reflexes: 3+, Aphasic and Apraxic Musc/Skel:  Other swelling L scalp   Assessment/Plan: 1. Functional deficits secondary  to L SDH, RHP and aphasia which require 3+ hours per day of interdisciplinary therapy in a comprehensive inpatient rehab setting. Physiatrist is providing close team supervision and 24 hour management of active medical problems listed below. Physiatrist and rehab team continue to assess barriers to discharge/monitor patient progress toward functional and medical goals. FIM:       FIM - Toileting Toileting Assistive Devices: Grab bar or rail for support Toileting: 6: Assistive device: No helper           Comprehension Comprehension Mode: Auditory  Expression Expression Mode: Verbal Expression: 3-Expresses basic 50 - 74% of the time/requires cueing 25 - 50% of the time. Needs to repeat parts of sentences.  Social Interaction Social Interaction: 4-Interacts appropriately 75 - 89% of the time - Needs redirection for appropriate language or to initiate interaction.  Problem Solving Problem Solving: 4-Solves basic 75 - 89% of the time/requires cueing 10 - 24% of the time  Memory Memory: 3-Recognizes or recalls 50 - 74% of the time/requires cueing 25 - 49% of the  time  Medical Problem List and Plan:  1. frontal subdural hemorrhage. Status post evacuation of hematoma 02/25/2012  2. DVT Prophylaxis/Anticoagulation: Support hose. Monitor for DVT  3. Pain Management: Norco as needed . Monitor mental status  4. seizure prophylaxis. Keppra 500 mg every 12 hours. Monitor for any seizure activity  5. 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 Patient independent prior to admission  6. Hypertension with history of bradycardia. Followup cardiology services as needed. Currently on no antihypertensive medications. Patient on Cozaar 50 mg daily, Lopressor 37.5 mg twice daily prior to admission. Plan to resume beta blocker 12.5 mg twice daily and titrate as needed if heart rate and blood pressure remained controlled  7. Diabetes mellitus. No current diabetic agents. Patient on Glucophage 500 mg daily prior to admission. Check CBGs a.c. and at bedtime  8. Depression. Zoloft 150 mg daily. Provide emotional support and positive reinforcement       LOS (Days) 1 A FACE TO FACE EVALUATION WAS PERFORMED  Alicia Crawford E 03/02/2012, 8:14 AM

## 2012-03-02 NOTE — Evaluation (Signed)
Physical Therapy Assessment and Plan  Patient Details  Name: Alicia Crawford MRN: [id number] Date of Birth: 30-May-1956  PT Diagnosis: Abnormality of gait, Cognitive deficits and Muscle weakness Rehab Potential: Good ELOS: 2 weeks   Today's Date: 03/02/2012 Time: 900-940 Time Calculation (min): 40 min  Problem List:  Patient Active Problem List  Diagnoses  . Peripheral artery disease, bilta LE PVD by angio April 2013  . HLD (hyperlipidemia)  . HTN (hypertension)  . Renal artery stenosis, Right, 75%  . Subdural hematoma  . S/P craniotomy, 02/25/12 after fall, SDH  . Bradycardia, she has had baseline bradycardia prior to admission  . Altered mental status  . Obesity  . Diabetes mellitus  . CAD, cardiac arrest, CPR, CABG in [facility] Dec 2011-NL LVF by Echo, neg Nuc April 2013  . Anxiety, ? residual hypoxic brain inhury from cardiac arrest 2011  . SDH (subdural hematoma)    Past Medical History:  Past Medical History  Diagnosis Date  . Myocardial infarction   . Hypertension   . Leg pain   . Anoxic brain injury 08/2010    "w/cardiac arrest"  . Rotator cuff tendonitis   . Complication of anesthesia     [place]  . High cholesterol   . Cardiac arrest 08/04/2010  . Type II diabetes mellitus   . Blood transfusion 09/2010  . Depression   . DEMENTIA   . Coronary artery disease    Past Surgical History:  Past Surgical History  Procedure Date  . Ovarian cyst removal 1983  . Stomach and skin tuck 2000  . Posterior fusion lumbar spine ` 2000  . Cholecystectomy ~ 1980's  . 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 Cardiac surgery   . Back surgery   . Craniotomy 02/25/2012    Procedure: CRANIOTOMY HEMATOMA EVACUATION SUBDURAL;  Surgeon: Tia Alert, MD;  Location: [facility];  Service: Neurosurgery;  Laterality: Left;  Left craniotomy for evacuation of subdural hematoma    Assessment & Plan Clinical Impression: Patient is a 56 y.o. year old female with  recent admission to the hospital on 02/25/2012 after she fell backwards on a wet ramp and struck the back of her head. She denied loss of consciousness. Patient noted headache with nausea and vomiting. CT of the head showed a large subdural hematoma. Underwent left craniotomy for evacuation of subdural hematoma 02/25/2012.  Patient transferred to CIR on 03/01/2012 .   Patient currently requires min with mobility secondary to muscle weakness, ataxia, decreased coordination and decreased motor planning and decreased attention, decreased awareness, decreased problem solving, decreased safety awareness and delayed processing.  Prior to hospitalization, patient was independent with mobility and lived with Spouse in a House home.  Home access is 4Stairs to enter.  Patient will benefit from skilled PT intervention to maximize safe functional mobility, minimize fall risk and decrease caregiver burden for planned discharge home with 24 hour supervision.  Anticipate patient will benefit from follow up OP at discharge.  PT - End of Session Activity Tolerance: Tolerates 30+ min activity with multiple rests Endurance Deficit: Yes Endurance Deficit Description: requires frequent rests PT Assessment Rehab Potential: Good PT Plan PT Frequency: 1-2 X/day, 60-90 minutes Estimated Length of Stay: 2 weeks PT Treatment/Interventions: Ambulation/gait training;Balance/vestibular training;Cognitive remediation/compensation;Discharge planning;Community reintegration;DME/adaptive equipment instruction;Functional mobility training;Patient/family education;Pain management;Stair training;Therapeutic Activities;UE/LE Coordination activities;Wheelchair propulsion/positioning;UE/LE Strength taining/ROM;Therapeutic Exercise;Neuromuscular re-education PT Recommendation Follow Up Recommendations: Outpatient PT  PT Evaluation Precautions/Restrictions Precautions Precautions: Fall Precaution Comments: decreased  safety  awareness Restrictions Weight Bearing Restrictions: No    Pain Pain Assessment Faces Pain Scale: No hurt Home Living/Prior Functioning Home Living Lives With: Spouse Available Help at Discharge: Family Type of Home: House Home Access: Stairs to enter [employer] of Steps: 4 Entrance Stairs-Rails: None Home Layout: Two level;Able to live on main level with bedroom/bathroom Prior Function Level of Independence: Independent with gait;Independent with transfers Able to Take Stairs?: Yes Driving: Yes   Cognition Overall Cognitive Status: Impaired Attention: Impaired Sustained and Selective attention Awareness: Impaired Awareness Impairment: Intellectual impairment Behaviors: Impulsive Safety/Judgment: Impaired Sensation Sensation Light Touch: Appears Intact Proprioception: Appears Intact Coordination Gross Motor Movements are Fluid and Coordinated: No Fine Motor Movements are Fluid and Coordinated: No Coordination and Movement Description: mild atxia with gait training Motor  Motor Motor: Ataxia Motor - Skilled Clinical Observations: mild gait ataxia  Mobility Bed Mobility Sit to Supine: 5: Supervision Transfers Sit to Stand: 4: Min assist Sit to Stand Details (indicate cue type and reason): cues for safety, steadying assist for LOB due to impulsive movements Stand Pivot Transfers: 4: Min assist Stand Pivot Transfer Details (indicate cue type and reason): steadying assist for balance with turning, mild ataxia noted Locomotion  Ambulation Ambulation/Gait Assistance: 4: Min assist Ambulation Distance (Feet): 150 Feet Ambulation/Gait Assistance Details: ataxic gait noted, min A for balance, increased LOB when distracted Stairs / Additional Locomotion Stairs: Yes Stairs Assistance: 4: Min assist Stairs Assistance Details (indicate cue type and reason):  cues for safety, steadying assist Stair Management Technique: Two rails Number of Stairs: 5   Wheelchair Mobility Wheelchair Mobility: No  Trunk/Postural Assessment  Cervical Assessment Cervical Assessment: Within Functional Limits Thoracic Assessment Thoracic Assessment: Within Functional Limits Lumbar Assessment Lumbar Assessment: Within Functional Limits Postural Control Postural Control:  (generalized trunk weakness, mild ataxia)  Balance Balance Balance Assessed: Yes Static Standing Balance Static Standing - Level of Assistance: 4: Min assist (for functional activity) Dynamic Standing Balance Dynamic Standing - Level of Assistance: 3: Mod assist (for functional activities) Extremity Assessment  RLE Assessment RLE Assessment: Within Functional Limits (grossly 3/5) LLE Assessment LLE Assessment: Within Functional Limits (grossly 3/5)  See FIM for current functional status Refer to Care Plan for Long Term Goals  Recommendations for other services: None  Discharge Criteria: Patient will be discharged from PT if patient refuses treatment 3 consecutive times without medical reason, if treatment goals not met, if there is a change in medical status, if patient makes no progress towards goals or if patient is discharged from hospital.  The above assessment, treatment plan, treatment alternatives and goals were discussed and mutually agreed upon: by patient  Treatment initiated during session: Pt performed bed making tasks for functional balance, attention and problem solving.  Pt able to attend to task in quiet environment x 5 minutes with supervision cuing.  Pt able to perform bed making with supervision cues, no motor apraxia or perceptual difficulties noted.  Letter naming task with ball toss with pt able to accurately name letters 100% of time.  Pt required total A to name words that start with identified letters.  Gait with cognitive challenges with min A for gait, mod A for LOB when externally distracted.  Pt able to gait and recite alphabet, months of year with min  cuing for cognitive challenge, min A for balance.  Pt fatigued and asked x 3 during session "can I go back to bed?"  Pt returned to room after 40 minutes of therapeutic activity.  Jermaine Tholl  03/02/2012, 2:27 PM

## 2012-03-02 NOTE — Care Management Note (Signed)
    Page 1 of 1   03/02/2012     9:26:49 AM   CARE MANAGEMENT NOTE 03/02/2012  Patient:  TAMMATHA, COBB   Account Number:  [id number]  Date Initiated:  02/29/2012  Documentation initiated by:  Jacquelynn Cree  Subjective/Objective Assessment:   Admitted with SDH, crainiotomy 02/25/12.     Action/Plan:   PT eval-recommending inpatient rehab  OT eval-recommending inpatient rehab   Anticipated DC Date:  03/03/2012   Anticipated DC Plan:  IP REHAB FACILITY      DC Planning Services  CM consult      PAC Choice  IP REHAB   Choice offered to / List presented to:             Status of service:  Completed, signed off Medicare Important Message given?   (If response is "NO", the following Medicare IM given date fields will be blank) Date Medicare IM given:   Date Additional Medicare IM given:    Discharge Disposition:  IP REHAB FACILITY  Per UR Regulation:  Reviewed for med. necessity/level of care/duration of stay  If discussed at Long Length of Stay Meetings, dates discussed:    Comments:  02/29/12 Patient being evaluated for inpatient rehab. Will continue to follow for d/c needs. Jacquelynn Cree RN, BSn, CCM

## 2012-03-02 NOTE — Progress Notes (Signed)
Patient information reviewed and entered into UDS-PRO system by Kingdavid Leinbach, RN, CRRN, PPS Coordinator.  Information including medical coding and functional independence measure will be reviewed and updated through discharge.    

## 2012-03-02 NOTE — Plan of Care (Addendum)
Overall Plan of Care [facility]) Patient Details Name: Alicia Crawford MRN: [id number] DOB: [birth year]  Diagnosis:rehabilitation for left subdural hematoma   Primary Diagnosis:    SDH (subdural hematoma) Co-morbidities: prior anoxic encephalopathy, new aphasia and apraxia and right hemiparesis  Functional Problem List  Patient demonstrates impairments in the following areas: Balance, Cognition, Endurance and Safety  Basic ADL's: grooming, bathing, dressing and toileting Advanced ADL's: simple meal preparation  Transfers:  bed mobility, bed to chair, toilet, tub/shower, car and furniture Locomotion:  ambulation and stairs  Additional Impairments:  Communication  comprehension and expression and Social Cognition   social interaction, problem solving, memory, attention and awareness  Anticipated Outcomes Item Anticipated Outcome  Eating/Swallowing  Supervision  Basic self-care  supervision  Tolieting   supervision  Bowel/Bladder  Continent of bowel and bladder  Transfers  supervision  Locomotion  supervision  Communication  Min A  Cognition  Min A  Pain  Pain level < or = 3  Safety/Judgment  Min A  Other     Therapy Plan: PT Frequency: 1-2 X/day, 60-90 minutes OT Frequency: 2-3 X/day, 60-90 minutes SLP Frequency: 1-2 X/day, 30-60 minutes   Team Interventions: Item RN PT OT SLP SW TR Other  Self Care/Advanced ADL Retraining   x      Neuromuscular Re-Education  x x      Therapeutic Activities  x x x     UE/LE Strength Training/ROM  x xx      UE/LE Coordination Activities  x       Visual/Perceptual Remediation/Compensation         DME/Adaptive Equipment Instruction  x x      Therapeutic Exercise  x x      Balance/Vestibular Training  x x      Patient/Family Education x x x x     Cognitive Remediation/Compensation  x x x     Functional Mobility Training  x       Ambulation/Gait Training  x       Museum/gallery curator  x       Wheelchair Propulsion/Positioning  x        Surveyor, mining Facilitation    x     Bladder Management x        Bowel Management x        Disease Management/Prevention         Pain Management x x       Medication Management x        Skin Care/Wound Management x        Splinting/Orthotics         Discharge Planning  x x x x    Psychosocial Support     x                       Team Discharge Planning: Destination:  Home Projected Follow-up:  PT, OT, SLP and Outpatient Projected Equipment Needs:  Scientist, physiological Patient/family involved in discharge planning:  Yes  MD ELOS: 2 weeks Medical Rehab Prognosis:  Good Assessment: 56 year old female who fell striking her head and sustained a left subdural hematoma which required evacuation. Now has residual aphasia right hemiparesis requiring CIR level PT, OT, speech and language pathology, 24 7 rehabilitation RN and M.D.

## 2012-03-02 NOTE — Evaluation (Signed)
Occupational Therapy Assessment and Plan  Patient Details  Name: Alicia Crawford MRN: [id number] Date of Birth: 30-Nov-1955  OT Diagnosis: cognitive deficits Rehab Potential: Rehab Potential: Good ELOS:10-14 days Today's Date: 03/02/2012 Time:  1st session:  0800-0900;  2nd session 1130-1210 Time Calculation (min): 1st session (60)   2nd session: (40 min  Problem List:  Patient Active Problem List  Diagnoses  . Peripheral artery disease, bilta LE PVD by angio April 2013  . HLD (hyperlipidemia)  . HTN (hypertension)  . Renal artery stenosis, Right, 75%  . Subdural hematoma  . S/P craniotomy, 02/25/12 after fall, SDH  . Bradycardia, she has had baseline bradycardia prior to admission  . Altered mental status  . Obesity  . Diabetes mellitus  . CAD, cardiac arrest, CPR, CABG in [facility] Dec 2011-NL LVF by Echo, neg Nuc April 2013  . Anxiety, ? residual hypoxic brain inhury from cardiac arrest 2011  . SDH (subdural hematoma)    Past Medical History:  Past Medical History  Diagnosis Date  . Myocardial infarction   . Hypertension   . Leg pain   . Anoxic brain injury 08/2010    "w/cardiac arrest"  . Rotator cuff tendonitis   . Complication of anesthesia     [place]  . High cholesterol   . Cardiac arrest 08/04/2010  . Type II diabetes mellitus   . Blood transfusion 09/2010  . Depression   . DEMENTIA   . Coronary artery disease    Past Surgical History:  Past Surgical History  Procedure Date  . Ovarian cyst removal 1983  . Stomach and skin tuck 2000  . Posterior fusion lumbar spine ` 2000  . Cholecystectomy ~ 1980's  . 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 Cardiac surgery   . Back surgery   . Craniotomy 02/25/2012    Procedure: CRANIOTOMY HEMATOMA EVACUATION SUBDURAL;  Surgeon: Tia Alert, MD;  Location: [facility];  Service: Neurosurgery;  Laterality: Left;  Left craniotomy for evacuation of subdural hematoma    Assessment & Plan Clinical  Impression:   56 year old right-handed female with noted history of anoxic brain injury November 2011 with cardiac arrest, diabetes mellitus and dementia. Admitted 02/25/2012 after she fell backwards on a wet ramp and struck the back of her head. She denied loss of consciousness. Patient noted headache with nausea and vomiting. CT of the head showed a large subdural hematoma. Underwent left craniotomy for evacuation of subdural hematoma 02/25/2012 per Dr. Marikay Alar. Placed on Keppra for seizure prophylaxis. Postoperatively with aphasia/ agitation and delirium. Occupational therapy evaluation and speech therapy completed 02/28/2012 with recommendations of physical medicine rehabilitation consult and consider inpatient rehabilitation services   Patient was very high functioning after her prior anoxic brain injury. She was driving, walking the dogs, totally independent.      Patient transferred to CIR on 03/01/2012 .    Patient currently requires min with basic self-care skills secondary to muscle weakness, motor apraxia and decreased awareness, decreased problem solving, decreased safety awareness and decreased memory.  Prior to hospitalization, patient could complete BADL  with no assist.  Patient will benefit from skilled intervention to increase independence with basic self-care skills and increase level of independence with iADL prior to discharge home with care partner.  Anticipate patient will require intermittent supervision and follow up home health.  OT - End of Session Activity Tolerance: Tolerates 10 - 20 min activity with multiple rests Endurance Deficit: Yes Endurance Deficit  Description: unable to stand for more than 2 minutes OT Assessment Rehab Potential: Good Barriers to Discharge: None OT Plan OT Frequency: 2-3 X/day, 60-90 minutes Estimated Length of Stay: 10-14 days OT Treatment/Interventions: Balance/vestibular training;Cognitive remediation/compensation;DME/adaptive equipment  instruction;Functional mobility training;Patient/family education;Self Care/advanced ADL retraining;Therapeutic Activities;Therapeutic Exercise;UE/LE Strength taining/ROM;UE/LE Coordination activities  OT Evaluation Precautions/Restrictions  Precautions Precautions: Fall Precaution Comments: decreased safety awareness Restrictions Weight Bearing Restrictions: No General:  Pt has difficulty with expressive language ie "I have" and then point to her head.  Asked pt if needed something for pain and she replied yes.        Pain Pain Assessment Pain Assessment: 0-10 Pain Score: Asleep Faces Pain Scale: No hurt Pain Type: Surgical pain Pain Location: Head Pain Intervention(s): Medication (See eMAR) Home Living/Prior Functioning Home Living Lives With: Spouse Available Help at Discharge: Family Type of Home: House Home Access: Stairs to enter Secretary/administrator of Steps: 4 Entrance Stairs-Rails: None Home Layout: Two level;Able to live on main level with bedroom/bathroom Alternate Level Stairs-Number of Steps: 13 Alternate Level Stairs-Rails: Can reach both Bathroom Shower/Tub: Walk-in shower;Door [employer] Toilet: Standard Bathroom Accessibility: Yes How Accessible: Accessible via walker Home Adaptive Equipment: Shower chair with back;Walker - rolling;Straight cane IADL History Homemaking Responsibilities: Yes Meal Prep Responsibility: Primary Laundry Responsibility: Primary Cleaning Responsibility: Primary Current License: Yes Mode of Transportation: Car Occupation:  (homemaker) Prior Function Level of Independence: Independent with gait;Independent with transfers Able to Take Stairs?: Yes Driving: Yes Vocation: Retired ADL   Vision/Perception  Vision - History Baseline Vision: No visual deficits Patient Visual Report: No change from baseline Vision - Assessment Eye Alignment: Within Functional Limits Additional Comments: swelling around left  eyelid Perception Perception: Impaired Spatial Orientation:  (holding fork and knife incorrectly) Praxis Praxis: Not tested  Cognition Overall Cognitive Status: Impaired Arousal/Alertness: Awake/alert (awake, alert) Orientation Level: Oriented to person;Oriented to place;Oriented to situation Awareness: Impaired Awareness Impairment: Intellectual impairment Problem Solving: Impaired Problem Solving Impairment: Functional basic Executive Function: Organizing;Sequencing Sequencing: Impaired Sequencing Impairment: Functional basic Organizing: Impaired Organizing Impairment: Functional basic Behaviors: Impulsive Safety/Judgment: Impaired Sensation Sensation Light Touch: Appears Intact Proprioception: Appears Intact Coordination Gross Motor Movements are Fluid and Coordinated: No Fine Motor Movements are Fluid and Coordinated: No Coordination and Movement Description: mild atxia with gait training Motor  Motor Motor: Ataxia Motor - Skilled Clinical Observations: mild gait ataxia Mobility     Trunk/Postural Assessment  Cervical Assessment Cervical Assessment: Within Functional Limits Thoracic Assessment Thoracic Assessment: Within Functional Limits Lumbar Assessment Lumbar Assessment: Within Functional Limits Postural Control Postural Control: Within Functional Limits  Balance:  See FIM   Extremity/Trunk Assessment RUE Assessment RUE Assessment: Exceptions to [facility] RUE Strength RUE Overall Strength:  (4/5) LUE Assessment LUE Assessment: Exceptions to [facility] LUE Strength LUE Overall Strength Comments: strength 4/5  See FIM for current functional status Refer to Care Plan for Long Term Goals  Recommendations for other services: None  Discharge Criteria: Patient will be discharged from OT if patient refuses treatment 3 consecutive times without medical reason, if treatment goals not met, if there is a change in medical status, if patient makes no progress towards goals  or if patient is discharged from hospital.  The above assessment, treatment plan, treatment alternatives and goals were discussed and mutually agreed upon: by patient  Humberto Seals 03/02/2012, 12:58 PM

## 2012-03-02 NOTE — Progress Notes (Signed)
Social Work  Social Work Assessment and Plan  Patient Details  Name: Alicia Crawford MRN: [id number] Date of Birth: 08-17-1956  Today's Date: 03/02/2012  Problem List:  Patient Active Problem List  Diagnoses  . Peripheral artery disease, bilta LE PVD by angio April 2013  . HLD (hyperlipidemia)  . HTN (hypertension)  . Renal artery stenosis, Right, 75%  . Subdural hematoma  . S/P craniotomy, 02/25/12 after fall, SDH  . Bradycardia, she has had baseline bradycardia prior to admission  . Altered mental status  . Obesity  . Diabetes mellitus  . CAD, cardiac arrest, CPR, CABG in [facility] Dec 2011-NL LVF by Echo, neg Nuc April 2013  . Anxiety, ? residual hypoxic brain inhury from cardiac arrest 2011  . SDH (subdural hematoma)   Past Medical History:  Past Medical History  Diagnosis Date  . Myocardial infarction   . Hypertension   . Leg pain   . Anoxic brain injury 08/2010    "w/cardiac arrest"  . Rotator cuff tendonitis   . Complication of anesthesia     [place]  . High cholesterol   . Cardiac arrest 08/04/2010  . Type II diabetes mellitus   . Blood transfusion 09/2010  . Depression   . DEMENTIA   . Coronary artery disease    Past Surgical History:  Past Surgical History  Procedure Date  . Ovarian cyst removal 1983  . Stomach and skin tuck 2000  . Posterior fusion lumbar spine ` 2000  . Cholecystectomy ~ 1980's  . 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 Cardiac surgery   . Back surgery   . Craniotomy 02/25/2012    Procedure: CRANIOTOMY HEMATOMA EVACUATION SUBDURAL;  Surgeon: Tia Alert, MD;  Location: [facility];  Service: Neurosurgery;  Laterality: Left;  Left craniotomy for evacuation of subdural hematoma   Social History:  reports that she quit smoking about 6 years ago. Her smoking use included Cigarettes. She has a 30 pack-year smoking history. She has never used smokeless tobacco. She reports that she drinks alcohol. She reports that she does  not use illicit drugs.  Family / Support Systems Marital Status: Married How Long?: 7 years (a 2nd marriage for both) Patient Roles: Spouse;Parent Spouse/Significant Other: husband, Sarah Baez @ [phone]  or [phone] Children: pt's daughter, Alianah Lofton @ (C) [phone] plus husband's two daughter, Herbert Seta @ [phone] and [place] @ [phone] Other Supports: pt's neice, Engineer, materials (local) and pt's sister, Lurena Joiner Adela Lank, [place] Anticipated Caregiver: husband with assistance from pt's and husband's daughters.   Ability/Limitations of Caregiver: husband owns several businesses and is scheduled to be out of town 6/2-6/6 but can cancel this trip if needed. Caregiver Availability: 24/7 Family Dynamics: Husband appears very committed to providing pt with any assistance she may need upon d/c but does assume daughters and other family will assist him.  Notes good relationship with pt's family.  Social History Preferred language: English Religion: Christian Cultural Background: NA Education: 2 years of college. 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  Pt with cognitive and  speech impairments at this time, therefore, intake interview completed with spouse.  Pt does not appear to be in any emotional distress, but husband does report that she becomes "anxious" with any changes (i.e. move to current room caused her some anxiety yesterday) Recent Psychosocial Issues: MI with anoxic  brain injury in Nov 2011 and underwent several months of therapy.  Husband feels she had "recovered to about 92% of her old self". Pyschiatric History: Since the above events, husband notes that pt's anxiety did seem to worsen and she was being followed at [facility] for this (q 90 days med adjustment) Substance Abuse History: none  Patient / Family Perceptions, Expectations & Goals Pt/Family understanding of illness & functional limitations: Husband with good, basic understanding of pt's SDH, surgery and current functional deficits.   Premorbid pt/family roles/activities: Pt has not worked with family business since events at end of 2011, however, was independent and still driving at home and community level. Anticipated changes in roles/activities/participation: Pt will likely require resumption of some caregiver supports - family to, again, provide this to her. Pt/family expectations/goals: Husband reports he "has a strong faith.Marland KitchenMarland KitchenI think she will recover from this, too".  He is realistic that she will require 24/7 initially.  Community [employer] Agencies: None Premorbid Home Care/DME Agencies: Other (Comment) ([facility] after ABI) Transportation available at discharge: yes Resource referrals recommended: Neuropsychology;Support group (specify) (Brain Injury support group)  Discharge Planning Living Arrangements: Spouse/significant other Support Systems: Spouse/significant other;Children;Other relatives;Friends/neighbors;Church/faith community Type of Residence: Private residence Insurance Resources: Media planner (specify) Manufacturing engineer) Financial Resources: Family Support Financial Screen Referred: No Living Expenses: Database administrator Management: Spouse Do you have any problems obtaining your medications?: No Home Management: patient was primary caretaker of home and meal prep Patient/Family Preliminary Plans: Pt to return to her own home with husband  as her primary support.  Other family to share in support as well. Social Work Anticipated Follow Up Needs: HH/OP;Support Group Expected length of stay: 2-3 weeks  Clinical Impression Unfortunate woman here after a fall with SDH.  Prior h/o MI with anoxic BI Nov 2011 with good recovery.  Husband very involved and supportive along with several other family members.  Pt currently with speech and cognitive deficits and cannot really assess emotional status but will monitor.  Megan Salon, Dakisha Schoof 03/02/2012, 4:39 PM

## 2012-03-03 LAB — GLUCOSE, CAPILLARY
Glucose-Capillary: 129 mg/dL — ABNORMAL HIGH (ref 70–99)
Glucose-Capillary: 149 mg/dL — ABNORMAL HIGH (ref 70–99)
Glucose-Capillary: 183 mg/dL — ABNORMAL HIGH (ref 70–99)

## 2012-03-03 NOTE — Progress Notes (Signed)
Occupational Therapy Session Note  Patient Details  Name: Kanyia Heaslip MRN: [id number] Date of Birth: 05/13/1956  Today's Date: 03/03/2012 Time: 0800-0900 Time Calculation (min): 60 min    Skilled Therapeutic Interventions/Progress Updates: Patient seen this am for OT to address balance, activity tolerance, initiation and sequencing during basic self care activities.  Patient crying out for help when OT arrived.  Helped patient determine needs, headache, requested pain medicine.  Patient anxious initially, but very easily subdued.  Patient followed steps of bathing and dressing / hygiene tasks with cueing to sequence next step.  Patient fed herself with cueing to continue with task.     Therapy Documentation Precautions:  Precautions Precautions: Fall Precaution Comments: decreased safety awareness Restrictions Weight Bearing Restrictions: No   Pain:  Headache, "bad"    See FIM for current functional status  Therapy/Group: Individual Therapy  Collier Salina 03/03/2012, 9:05 AM

## 2012-03-03 NOTE — Progress Notes (Signed)
Physical Therapy Note  Patient Details  Name: Alicia Crawford MRN: [id number] Date of Birth: 09-21-1956 Today's Date: 03/03/2012  Time: 1300-1345 45 minutes  Pt continuously c/o headache throughout treatment session, RN aware, pt medicated prior to treatment.  Attempt to engage pt in plant watering, puzzle, card game.  Pt with decreased focused attention < 15 seconds due to distraction of pain.  Pt agreeable to attempt to eat some of her lunch.  Attention to lunch meal x 5 minutes with min-mod cues.  Pt able to engage in horseshoe toss activity with adding up score.  Pt min A for standing balance to toss and pick up horseshoes off of ground.  Adding score performed on paper with pt determining score with max cues, writing score with min cues for attention.  Pt able to add 2 numbers with min cues, required total A to add greater than 2 numbers.  Pt limited this session due to internal pain distraction.   Angelus Hoopes 03/03/2012, 1:46 PM

## 2012-03-03 NOTE — Progress Notes (Signed)
Occupational Therapy Session Note  Patient Details  Name: Alicia Crawford MRN: [id number] Date of Birth: 01-15-1956  Today's Date: 03/03/2012 Time: 1400-1430 Time Calculation (min): 30 min  Skilled Therapeutic Interventions/Progress Updates: Patient seen this pm to address ability to sustain attention within functional activity.  Assisted patient with finishing lunch.  Patient required up to moderate cueing visual, contextual, and verbal to continue with task.  Patient very easily lead to an activity or need, e.g. Toileting, but little to no ability to get needs known at this point.  Patient becomes anxious with pain, and when left alone.  Moves very quickly at times.  Able to state "I am in the hospital.  I had a fall."       Therapy Documentation Precautions:  Precautions Precautions: Fall Precaution Comments: decreased safety awareness Restrictions Weight Bearing Restrictions: No    Pain:  No report of pain.   See FIM for current functional status  Therapy/Group: Individual Therapy  Alicia Crawford 03/03/2012, 2:34 PM

## 2012-03-03 NOTE — Progress Notes (Signed)
Patient ID: Alicia Crawford, female   DOB: 06-04-1956, 56 y.o.   MRN: [id number] Subjective/Complaints: 56 year old right-handed female with noted history of anoxic brain injury November 2011 with cardiac arrest, diabetes mellitus and dementia. Patient was very high functioning after prior anoxic brain injury. She was driving, walking the dogs and independent. Admitted 02/25/2012 after she fell backwards on a wet ramp and struck the back of her head. She denied loss of consciousness. Patient noted headache with nausea and vomiting. CT of the head showed a large subdural hematoma. Underwent left craniotomy for evacuation of subdural hematoma 02/25/2012 per Dr. Marikay Alar. Placed on Keppra for seizure prophylaxis.   "I have to pee" Review of Systems  Unable to perform ROS: language   Objective: Vital Signs: Blood pressure 162/88, pulse 70, temperature 98.7 F (37.1 C), temperature source Oral, resp. rate 20, SpO2 97.00%. No results found. Results for orders placed during the hospital encounter of 03/01/12 (from the past 72 hour(s))  GLUCOSE, CAPILLARY     Status: Abnormal   Collection Time   03/02/12 12:07 AM      Component Value Range Comment   Glucose-Capillary 164 (*) 70 - 99 (mg/dL)    Comment 1 Notify RN     GLUCOSE, CAPILLARY     Status: Abnormal   Collection Time   03/02/12  5:12 AM      Component Value Range Comment   Glucose-Capillary 138 (*) 70 - 99 (mg/dL)    Comment 1 Notify RN     CBC     Status: Abnormal   Collection Time   03/02/12  6:35 AM      Component Value Range Comment   WBC 10.5  4.0 - 10.5 (K/uL)    RBC 3.78 (*) 3.87 - 5.11 (MIL/uL)    Hemoglobin 11.5 (*) 12.0 - 15.0 (g/dL)    HCT 40.9 (*) 81.1 - 46.0 (%)    MCV 90.7  78.0 - 100.0 (fL)    MCH 30.4  26.0 - 34.0 (pg)    MCHC 33.5  30.0 - 36.0 (g/dL)    RDW 91.4  78.2 - 95.6 (%)    Platelets 293  150 - 400 (K/uL)   COMPREHENSIVE METABOLIC PANEL     Status: Abnormal   Collection Time   03/02/12  6:35 AM   Component Value Range Comment   Sodium 138  135 - 145 (mEq/L)    Potassium 3.7  3.5 - 5.1 (mEq/L)    Chloride 100  96 - 112 (mEq/L)    CO2 27  19 - 32 (mEq/L)    Glucose, Bld 143 (*) 70 - 99 (mg/dL)    BUN 9  6 - 23 (mg/dL)    Creatinine, Ser 2.13  0.50 - 1.10 (mg/dL)    Calcium 8.9  8.4 - 10.5 (mg/dL)    Total Protein 6.8  6.0 - 8.3 (g/dL)    Albumin 3.1 (*) 3.5 - 5.2 (g/dL)    AST 13  0 - 37 (U/L)    ALT 19  0 - 35 (U/L)    Alkaline Phosphatase 71  39 - 117 (U/L)    Total Bilirubin 0.5  0.3 - 1.2 (mg/dL)    GFR calc non Af Amer >90  >90 (mL/min)    GFR calc Af Amer >90  >90 (mL/min)   DIFFERENTIAL     Status: Normal   Collection Time   03/02/12  6:35 AM      Component Value Range Comment  Neutrophils Relative 67  43 - 77 (%)    Neutro Abs 7.0  1.7 - 7.7 (K/uL)    Lymphocytes Relative 24  12 - 46 (%)    Lymphs Abs 2.5  0.7 - 4.0 (K/uL)    Monocytes Relative 8  3 - 12 (%)    Monocytes Absolute 0.8  0.1 - 1.0 (K/uL)    Eosinophils Relative 1  0 - 5 (%)    Eosinophils Absolute 0.1  0.0 - 0.7 (K/uL)    Basophils Relative 0  0 - 1 (%)    Basophils Absolute 0.0  0.0 - 0.1 (K/uL)   GLUCOSE, CAPILLARY     Status: Abnormal   Collection Time   03/02/12  7:24 AM      Component Value Range Comment   Glucose-Capillary 166 (*) 70 - 99 (mg/dL)    Comment 1 Notify RN     GLUCOSE, CAPILLARY     Status: Abnormal   Collection Time   03/02/12 11:38 AM      Component Value Range Comment   Glucose-Capillary 142 (*) 70 - 99 (mg/dL)    Comment 1 Notify RN     GLUCOSE, CAPILLARY     Status: Abnormal   Collection Time   03/02/12  4:18 PM      Component Value Range Comment   Glucose-Capillary 174 (*) 70 - 99 (mg/dL)   GLUCOSE, CAPILLARY     Status: Abnormal   Collection Time   03/02/12  8:29 PM      Component Value Range Comment   Glucose-Capillary 199 (*) 70 - 99 (mg/dL)   GLUCOSE, CAPILLARY     Status: Abnormal   Collection Time   03/03/12 12:20 AM      Component Value Range Comment    Glucose-Capillary 183 (*) 70 - 99 (mg/dL)    Comment 1 Notify RN     GLUCOSE, CAPILLARY     Status: Abnormal   Collection Time   03/03/12  4:41 AM      Component Value Range Comment   Glucose-Capillary 149 (*) 70 - 99 (mg/dL)    Comment 1 Notify RN        HEENT: normal Cardio: RRR Resp: CTA B/L GI: BS positive Extremity:  No Edema Skin:   Wound C/D/I Neuro: Abnormal Motor 4/5 on R 5/5 on L, Abnormal FMC Ataxic/ dec FMC, Reflexes: 3+, Aphasic and Apraxic Musc/Skel:  Other swelling L scalp   Assessment/Plan: 1. Functional deficits secondary to L SDH, RHP and aphasia which require 3+ hours per day of interdisciplinary therapy in a comprehensive inpatient rehab setting. Physiatrist is providing close team supervision and 24 hour management of active medical problems listed below. Physiatrist and rehab team continue to assess barriers to discharge/monitor patient progress toward functional and medical goals. FIM: FIM - Bathing Bathing Steps Patient Completed: Chest;Right Arm;Left Arm;Abdomen;Front perineal area;Buttocks;Right upper leg;Left upper leg;Right lower leg (including foot);Left lower leg (including foot) Bathing: 4: Min-Patient completes 8-9 5f 10 parts or 75+ percent  FIM - Upper Body Dressing/Undressing Upper body dressing/undressing: 0: Activity did not occur FIM - Lower Body Dressing/Undressing Lower body dressing/undressing: 0: Activity did not occur  FIM - Toileting Toileting steps completed by patient: Performs perineal hygiene;Adjust clothing prior to toileting;Adjust clothing after toileting Toileting Assistive Devices: Grab bar or rail for support Toileting: 4: Steadying assist  FIM - Diplomatic Services operational officer Devices: Grab bars Toilet Transfers: 4-To toilet/BSC: Min A (steadying Pt. > 75%);4-From toilet/BSC: Min A (steadying  Pt. > 75%)  FIM - Bed/Chair Transfer Bed/Chair Transfer Assistive Devices: Bed rails Bed/Chair Transfer: 4: Bed >  Chair or W/C: Min A (steadying Pt. > 75%);4: Chair or W/C > Bed: Min A (steadying Pt. > 75%)  FIM - Locomotion: Wheelchair Locomotion: Wheelchair: 0: Activity did not occur FIM - Locomotion: Ambulation Ambulation/Gait Assistance: 4: Min assist Locomotion: Ambulation: 4: Travels 150 ft or more with minimal assistance (Pt.>75%)  Comprehension Comprehension Mode: Auditory Comprehension: 3-Understands basic 50 - 74% of the time/requires cueing 25 - 50%  of the time  Expression Expression Mode: Verbal Expression: 2-Expresses basic 25 - 49% of the time/requires cueing 50 - 75% of the time. Uses single words/gestures.  Social Interaction Social Interaction: 2-Interacts appropriately 25 - 49% of time - Needs frequent redirection.  Problem Solving Problem Solving: 2-Solves basic 25 - 49% of the time - needs direction more than half the time to initiate, plan or complete simple activities  Memory Memory: 2-Recognizes or recalls 25 - 49% of the time/requires cueing 51 - 75% of the time  Medical Problem List and Plan:  1. frontal subdural hemorrhage. Status post evacuation of hematoma 02/25/2012  2. DVT Prophylaxis/Anticoagulation: Support hose. Monitor for DVT  3. Pain Management: Norco as needed . Monitor mental status  4. seizure prophylaxis. Keppra 500 mg every 12 hours. Monitor for any seizure activity  5. 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 Patient independent prior to admission  6. Hypertension with history of bradycardia. Followup cardiology services as needed. Currently on no antihypertensive medications. Patient on Cozaar 50 mg daily, Lopressor 37.5 mg twice daily prior to admission. Plan to resume beta blocker 12.5 mg twice daily and titrate as needed if heart rate and blood pressure remained controlled  7. Diabetes mellitus. No current diabetic agents. Patient on Glucophage 500 mg daily prior to admission. Check CBGs a.c. and at bedtime  8. Depression.  Zoloft 150 mg daily. Provide emotional support and positive reinforcement       LOS (Days) 2 A FACE TO FACE EVALUATION WAS PERFORMED  Tashon Capp E 03/03/2012, 7:41 AM

## 2012-03-03 NOTE — Progress Notes (Signed)
Physical Therapy Note  Patient Details  Name: Alicia Crawford MRN: [id number] Date of Birth: 04-25-1956 Today's Date: 03/03/2012  Time: 900-957 57 minutes  No c/o pain.  Treatment focused on mobility, language and cognitive challenges during functional kitchen task of baking cookies.  Pt able choose between 2 choices of type of cookies, choice of utensils to use in prep.  Pt required min cuing for naming items in the kitchen.  Pt required min cuing to recall steps of baking process after reading instructions.  Able to safely prepare cookie mix with min-mod cues for attention ? Due to fatigue or cognition.  Pt performed baking activity standing > 30 minutes with 2 seated rests showing increased muscular endurance, min A for balance with carrying items in small space, pt with some impulsive movements noted in kitchen but overall safety awareness with mod A.  Pt required < 10% cuing for sequencing with washing and drying dishes, max cues to use labels on cabinet doors to determine where dishes should be put away.  Overall pt with improved activity tolerance this session.  Pt states "this was good" and indicated she enjoys baking activities.  Individual therapy  Emmilee Reamer 03/03/2012, 10:38 AM

## 2012-03-04 LAB — GLUCOSE, CAPILLARY
Glucose-Capillary: 147 mg/dL — ABNORMAL HIGH (ref 70–99)
Glucose-Capillary: 159 mg/dL — ABNORMAL HIGH (ref 70–99)
Glucose-Capillary: 180 mg/dL — ABNORMAL HIGH (ref 70–99)
Glucose-Capillary: 173 mg/dL — ABNORMAL HIGH (ref 70–99)

## 2012-03-04 NOTE — Progress Notes (Signed)
Occupational Therapy Session Note  Patient Details  Name: Alicia Crawford MRN: [id number] Date of Birth: 23-Nov-1955  Today's Date: 03/04/2012 Time:  - 0805-0900   (55 min) Pain:  Left head pain Short Term Goals: Week 1:  OT Short Term Goal 1 (Week 1): Pt will be mod I with bathing OT Short Term Goal 2 (Week 1): Pt. will be mod I with dressing OT Short Term Goal 3 (Week 1): Pt. will be mod I with toileting OT Short Term Goal 4 (Week 1): Pt. will be mod I with toilet transfer OT Short Term Goal 5 (Week 1): Pt. will be supervision with shower transfer  Skilled Therapeutic Interventions/Progress Updates:    Pt. Lying in bed upon OT arrival.  Pt agreed to take shower.  Pt. Ambulated with min to supervision assist to shower.  Asked her  to cover seat with towel.  She put about 4 washcoths on seat.  Pt. Performed bathing and dressing.  She was not initiating any conversation.  Asked if she wanted to brush her teeth.  She did and then asked her to straighten her bed.  She moaned about doing the bed.  She did not want to do anything else.  She went back to bed.    Therapy Documentation Precautions:  Precautions Precautions: Fall Precaution Comments: decreased safety awareness Restrictions Weight Bearing Restrictions: No General:    Other Treatments:    See FIM for current functional status  Therapy/Group: Individual Therapy  Humberto Seals 03/04/2012, 9:00 AM

## 2012-03-04 NOTE — Progress Notes (Signed)
Patient ID: Alicia Crawford, female   DOB: 02/28/1956, 56 y.o.   MRN: [id number] Patient ID: Alicia Crawford, female   DOB: 1955/11/24, 56 y.o.   MRN: [id number] Subjective/Complaints: 56 year old right-handed female with noted history of anoxic brain injury November 2011 with cardiac arrest, diabetes mellitus and dementia. Patient was very high functioning after prior anoxic brain injury. She was driving, walking the dogs and independent. Admitted 02/25/2012 after she fell backwards on a wet ramp and struck the back of her head. She denied loss of consciousness. Patient noted headache with nausea and vomiting. CT of the head showed a large subdural hematoma. Underwent left craniotomy for evacuation of subdural hematoma 02/25/2012 per Dr. Marikay Alar. Placed on Keppra for seizure prophylaxis.   Tired but easily arousable.  No c/os Review of Systems  Unable to perform ROS: language   Objective: Vital Signs: Blood pressure 154/70, pulse 74, temperature 98.3 F (36.8 C), temperature source Oral, resp. rate 20, SpO2 100.00%. No results found. Results for orders placed during the hospital encounter of 03/01/12 (from the past 72 hour(s))  GLUCOSE, CAPILLARY     Status: Abnormal   Collection Time   03/02/12 12:07 AM      Component Value Range Comment   Glucose-Capillary 164 (*) 70 - 99 (mg/dL)    Comment 1 Notify RN     GLUCOSE, CAPILLARY     Status: Abnormal   Collection Time   03/02/12  5:12 AM      Component Value Range Comment   Glucose-Capillary 138 (*) 70 - 99 (mg/dL)    Comment 1 Notify RN     CBC     Status: Abnormal   Collection Time   03/02/12  6:35 AM      Component Value Range Comment   WBC 10.5  4.0 - 10.5 (K/uL)    RBC 3.78 (*) 3.87 - 5.11 (MIL/uL)    Hemoglobin 11.5 (*) 12.0 - 15.0 (g/dL)    HCT 62.9 (*) 52.8 - 46.0 (%)    MCV 90.7  78.0 - 100.0 (fL)    MCH 30.4  26.0 - 34.0 (pg)    MCHC 33.5  30.0 - 36.0 (g/dL)    RDW 41.3  24.4 - 01.0 (%)    Platelets 293  150 - 400  (K/uL)   COMPREHENSIVE METABOLIC PANEL     Status: Abnormal   Collection Time   03/02/12  6:35 AM      Component Value Range Comment   Sodium 138  135 - 145 (mEq/L)    Potassium 3.7  3.5 - 5.1 (mEq/L)    Chloride 100  96 - 112 (mEq/L)    CO2 27  19 - 32 (mEq/L)    Glucose, Bld 143 (*) 70 - 99 (mg/dL)    BUN 9  6 - 23 (mg/dL)    Creatinine, Ser 2.72  0.50 - 1.10 (mg/dL)    Calcium 8.9  8.4 - 10.5 (mg/dL)    Total Protein 6.8  6.0 - 8.3 (g/dL)    Albumin 3.1 (*) 3.5 - 5.2 (g/dL)    AST 13  0 - 37 (U/L)    ALT 19  0 - 35 (U/L)    Alkaline Phosphatase 71  39 - 117 (U/L)    Total Bilirubin 0.5  0.3 - 1.2 (mg/dL)    GFR calc non Af Amer >90  >90 (mL/min)    GFR calc Af Amer >90  >90 (mL/min)   DIFFERENTIAL  Status: Normal   Collection Time   03/02/12  6:35 AM      Component Value Range Comment   Neutrophils Relative 67  43 - 77 (%)    Neutro Abs 7.0  1.7 - 7.7 (K/uL)    Lymphocytes Relative 24  12 - 46 (%)    Lymphs Abs 2.5  0.7 - 4.0 (K/uL)    Monocytes Relative 8  3 - 12 (%)    Monocytes Absolute 0.8  0.1 - 1.0 (K/uL)    Eosinophils Relative 1  0 - 5 (%)    Eosinophils Absolute 0.1  0.0 - 0.7 (K/uL)    Basophils Relative 0  0 - 1 (%)    Basophils Absolute 0.0  0.0 - 0.1 (K/uL)   GLUCOSE, CAPILLARY     Status: Abnormal   Collection Time   03/02/12  7:24 AM      Component Value Range Comment   Glucose-Capillary 166 (*) 70 - 99 (mg/dL)    Comment 1 Notify RN     GLUCOSE, CAPILLARY     Status: Abnormal   Collection Time   03/02/12 11:38 AM      Component Value Range Comment   Glucose-Capillary 142 (*) 70 - 99 (mg/dL)    Comment 1 Notify RN     GLUCOSE, CAPILLARY     Status: Abnormal   Collection Time   03/02/12  4:18 PM      Component Value Range Comment   Glucose-Capillary 174 (*) 70 - 99 (mg/dL)   GLUCOSE, CAPILLARY     Status: Abnormal   Collection Time   03/02/12  8:29 PM      Component Value Range Comment   Glucose-Capillary 199 (*) 70 - 99 (mg/dL)   GLUCOSE,  CAPILLARY     Status: Abnormal   Collection Time   03/03/12 12:20 AM      Component Value Range Comment   Glucose-Capillary 183 (*) 70 - 99 (mg/dL)    Comment 1 Notify RN     GLUCOSE, CAPILLARY     Status: Abnormal   Collection Time   03/03/12  4:41 AM      Component Value Range Comment   Glucose-Capillary 149 (*) 70 - 99 (mg/dL)    Comment 1 Notify RN     GLUCOSE, CAPILLARY     Status: Abnormal   Collection Time   03/03/12  7:49 AM      Component Value Range Comment   Glucose-Capillary 127 (*) 70 - 99 (mg/dL)   GLUCOSE, CAPILLARY     Status: Abnormal   Collection Time   03/03/12 12:11 PM      Component Value Range Comment   Glucose-Capillary 146 (*) 70 - 99 (mg/dL)   GLUCOSE, CAPILLARY     Status: Abnormal   Collection Time   03/03/12  4:28 PM      Component Value Range Comment   Glucose-Capillary 195 (*) 70 - 99 (mg/dL)   GLUCOSE, CAPILLARY     Status: Abnormal   Collection Time   03/03/12  8:09 PM      Component Value Range Comment   Glucose-Capillary 209 (*) 70 - 99 (mg/dL)    Comment 1 Notify RN     GLUCOSE, CAPILLARY     Status: Abnormal   Collection Time   03/03/12 11:55 PM      Component Value Range Comment   Glucose-Capillary 129 (*) 70 - 99 (mg/dL)    Comment 1 Notify RN  GLUCOSE, CAPILLARY     Status: Abnormal   Collection Time   03/04/12  4:05 AM      Component Value Range Comment   Glucose-Capillary 147 (*) 70 - 99 (mg/dL)    Comment 1 Notify RN        HEENT: normal Cardio: RRR Resp: CTA B/L GI: BS positive Extremity:  No Edema Skin:   Wound C/D/I Neuro: Abnormal Motor 4/5 on R 5/5 on L, Abnormal FMC Ataxic/ dec FMC, Reflexes: 3+, Aphasic and Apraxic Musc/Skel:  Other swelling L scalp   Assessment/Plan: 1. Functional deficits secondary to L SDH, RHP and aphasia which require 3+ hours per day of interdisciplinary therapy in a comprehensive inpatient rehab setting. Physiatrist is providing close team supervision and 24 hour management of active medical  problems listed below. Physiatrist and rehab team continue to assess barriers to discharge/monitor patient progress toward functional and medical goals. FIM: FIM - Bathing Bathing Steps Patient Completed: Chest;Right Arm;Left Arm;Abdomen;Front perineal area;Buttocks;Right upper leg;Left upper leg Bathing: 4: Min-Patient completes 8-9 67f 10 parts or 75+ percent  FIM - Upper Body Dressing/Undressing Upper body dressing/undressing: 0: Wears gown/pajamas-no public clothing FIM - Lower Body Dressing/Undressing Lower body dressing/undressing: 0: Wears gown/pajamas-no public clothing  FIM - Toileting Toileting steps completed by patient: Adjust clothing prior to toileting;Adjust clothing after toileting;Performs perineal hygiene Toileting Assistive Devices: Grab bar or rail for support Toileting: 4: Steadying assist  FIM - Diplomatic Services operational officer Devices: Grab bars Toilet Transfers: 5-To toilet/BSC: Supervision (verbal cues/safety issues)  FIM - Banker Devices: Bed rails Bed/Chair Transfer: 5: Supine > Sit: Supervision (verbal cues/safety issues);4: Bed > Chair or W/C: Min A (steadying Pt. > 75%);4: Chair or W/C > Bed: Min A (steadying Pt. > 75%);6: Sit > Supine: No assist  FIM - Locomotion: Wheelchair Locomotion: Wheelchair: 0: Activity did not occur FIM - Locomotion: Ambulation Ambulation/Gait Assistance: 4: Min assist Locomotion: Ambulation: 4: Travels 150 ft or more with minimal assistance (Pt.>75%)  Comprehension Comprehension Mode: Auditory Comprehension: 5-Understands basic 90% of the time/requires cueing < 10% of the time  Expression Expression Mode: Verbal Expression: 2-Expresses basic 25 - 49% of the time/requires cueing 50 - 75% of the time. Uses single words/gestures.  Social Interaction Social Interaction: 3-Interacts appropriately 50 - 74% of the time - May be physically or verbally inappropriate.  Problem  Solving Problem Solving: 2-Solves basic 25 - 49% of the time - needs direction more than half the time to initiate, plan or complete simple activities  Memory Memory: 2-Recognizes or recalls 25 - 49% of the time/requires cueing 51 - 75% of the time  Medical Problem List and Plan:  1. frontal subdural hemorrhage. Status post evacuation of hematoma 02/25/2012  2. DVT Prophylaxis/Anticoagulation: Support hose. Monitor for DVT  3. Pain Management: Norco as needed . Monitor mental status  4. seizure prophylaxis. Keppra 500 mg every 12 hours. Monitor for any seizure activity  5. 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 Patient independent prior to admission  6. Hypertension with history of bradycardia. Followup cardiology services as needed. Currently on no antihypertensive medications. Patient on Cozaar 50 mg daily, Lopressor 37.5 mg twice daily prior to admission. Plan to resume beta blocker 12.5 mg twice daily and titrate as needed if heart rate and blood pressure remained controlled  7. Diabetes mellitus. No current diabetic agents. Patient on Glucophage 500 mg daily prior to admission. Check CBGs a.c. and at bedtime  8. Depression.  Zoloft 150 mg daily. Provide emotional support and positive reinforcement       LOS (Days) 3 A FACE TO FACE EVALUATION WAS PERFORMED  Michale Weikel E 03/04/2012, 7:01 AM

## 2012-03-05 DIAGNOSIS — R4701 Aphasia: Secondary | ICD-10-CM

## 2012-03-05 DIAGNOSIS — W19XXXA Unspecified fall, initial encounter: Secondary | ICD-10-CM

## 2012-03-05 DIAGNOSIS — S065XAA Traumatic subdural hemorrhage with loss of consciousness status unknown, initial encounter: Secondary | ICD-10-CM

## 2012-03-05 DIAGNOSIS — S065X9A Traumatic subdural hemorrhage with loss of consciousness of unspecified duration, initial encounter: Secondary | ICD-10-CM

## 2012-03-05 DIAGNOSIS — Z5189 Encounter for other specified aftercare: Secondary | ICD-10-CM

## 2012-03-05 LAB — GLUCOSE, CAPILLARY: Glucose-Capillary: 212 mg/dL — ABNORMAL HIGH (ref 70–99)

## 2012-03-05 MED ORDER — INSULIN ASPART 100 UNIT/ML ~~LOC~~ SOLN
0.0000 [IU] | Freq: Three times a day (TID) | SUBCUTANEOUS | Status: DC
  Administered 2012-03-05: 3 [IU] via SUBCUTANEOUS
  Administered 2012-03-05: 5 [IU] via SUBCUTANEOUS
  Administered 2012-03-05 – 2012-03-07 (×6): 3 [IU] via SUBCUTANEOUS
  Administered 2012-03-07 – 2012-03-08 (×4): 2 [IU] via SUBCUTANEOUS
  Administered 2012-03-09 – 2012-03-10 (×5): 3 [IU] via SUBCUTANEOUS
  Administered 2012-03-10 – 2012-03-11 (×3): 2 [IU] via SUBCUTANEOUS
  Administered 2012-03-11: 3 [IU] via SUBCUTANEOUS
  Administered 2012-03-12: 2 [IU] via SUBCUTANEOUS
  Administered 2012-03-12 (×2): 3 [IU] via SUBCUTANEOUS
  Administered 2012-03-13: 2 [IU] via SUBCUTANEOUS
  Administered 2012-03-13: 3 [IU] via SUBCUTANEOUS
  Administered 2012-03-13: 5 [IU] via SUBCUTANEOUS
  Administered 2012-03-14: 3 [IU] via SUBCUTANEOUS
  Administered 2012-03-14: 5 [IU] via SUBCUTANEOUS
  Administered 2012-03-14: 3 [IU] via SUBCUTANEOUS
  Administered 2012-03-15 (×2): 5 [IU] via SUBCUTANEOUS
  Administered 2012-03-15: 2 [IU] via SUBCUTANEOUS

## 2012-03-05 NOTE — Progress Notes (Signed)
Subjective/Complaints:  Denies pain. Slept well.  No c/os Review of Systems  Unable to perform ROS: language   Objective: Vital Signs: Blood pressure 172/78, pulse 71, temperature 98.4 F (36.9 C), temperature source Oral, resp. rate 20, SpO2 98.00%. No results found. Results for orders placed during the hospital encounter of 03/01/12 (from the past 72 hour(s))  GLUCOSE, CAPILLARY     Status: Abnormal   Collection Time   03/02/12 11:38 AM      Component Value Range Comment   Glucose-Capillary 142 (*) 70 - 99 (mg/dL)    Comment 1 Notify RN     GLUCOSE, CAPILLARY     Status: Abnormal   Collection Time   03/02/12  4:18 PM      Component Value Range Comment   Glucose-Capillary 174 (*) 70 - 99 (mg/dL)   GLUCOSE, CAPILLARY     Status: Abnormal   Collection Time   03/02/12  8:29 PM      Component Value Range Comment   Glucose-Capillary 199 (*) 70 - 99 (mg/dL)   GLUCOSE, CAPILLARY     Status: Abnormal   Collection Time   03/03/12 12:20 AM      Component Value Range Comment   Glucose-Capillary 183 (*) 70 - 99 (mg/dL)    Comment 1 Notify RN     GLUCOSE, CAPILLARY     Status: Abnormal   Collection Time   03/03/12  4:41 AM      Component Value Range Comment   Glucose-Capillary 149 (*) 70 - 99 (mg/dL)    Comment 1 Notify RN     GLUCOSE, CAPILLARY     Status: Abnormal   Collection Time   03/03/12  7:49 AM      Component Value Range Comment   Glucose-Capillary 127 (*) 70 - 99 (mg/dL)   GLUCOSE, CAPILLARY     Status: Abnormal   Collection Time   03/03/12 12:11 PM      Component Value Range Comment   Glucose-Capillary 146 (*) 70 - 99 (mg/dL)   GLUCOSE, CAPILLARY     Status: Abnormal   Collection Time   03/03/12  4:28 PM      Component Value Range Comment   Glucose-Capillary 195 (*) 70 - 99 (mg/dL)   GLUCOSE, CAPILLARY     Status: Abnormal   Collection Time   03/03/12  8:09 PM      Component Value Range Comment   Glucose-Capillary 209 (*) 70 - 99 (mg/dL)    Comment 1 Notify RN       GLUCOSE, CAPILLARY     Status: Abnormal   Collection Time   03/03/12 11:55 PM      Component Value Range Comment   Glucose-Capillary 129 (*) 70 - 99 (mg/dL)    Comment 1 Notify RN     GLUCOSE, CAPILLARY     Status: Abnormal   Collection Time   03/04/12  4:05 AM      Component Value Range Comment   Glucose-Capillary 147 (*) 70 - 99 (mg/dL)    Comment 1 Notify RN     GLUCOSE, CAPILLARY     Status: Abnormal   Collection Time   03/04/12  8:05 AM      Component Value Range Comment   Glucose-Capillary 159 (*) 70 - 99 (mg/dL)   GLUCOSE, CAPILLARY     Status: Abnormal   Collection Time   03/04/12 12:29 PM      Component Value Range Comment   Glucose-Capillary 180 (*) 70 -  99 (mg/dL)   GLUCOSE, CAPILLARY     Status: Abnormal   Collection Time   03/04/12  4:16 PM      Component Value Range Comment   Glucose-Capillary 162 (*) 70 - 99 (mg/dL)   GLUCOSE, CAPILLARY     Status: Abnormal   Collection Time   03/04/12  8:40 PM      Component Value Range Comment   Glucose-Capillary 173 (*) 70 - 99 (mg/dL)    Comment 1 Notify RN     GLUCOSE, CAPILLARY     Status: Abnormal   Collection Time   03/05/12  9:09 AM      Component Value Range Comment   Glucose-Capillary 173 (*) 70 - 99 (mg/dL)    Comment 1 Notify RN        HEENT: normal Cardio: RRR Resp: CTA B/L GI: BS positive Extremity:  No Edema Skin:   Wound C/D/I Neuro: Abnormal Motor 4/5 on R 5/5 on L, Abnormal FMC Ataxic/ dec FMC, Reflexes: 3+, Aphasic and Apraxic Musc/Skel:  Other swelling L scalpwound clean and intact Word finding deficits, sometimes perseverative. Folllows simple commands. Able to read the date on the calendar with extra time and cues.     Assessment/Plan: 1. Functional deficits secondary to L SDH, RHP and aphasia which require 3+ hours per day of interdisciplinary therapy in a comprehensive inpatient rehab setting. Physiatrist is providing close team supervision and 24 hour management of active medical problems listed  below. Physiatrist and rehab team continue to assess barriers to discharge/monitor patient progress toward functional and medical goals. FIM: FIM - Bathing Bathing Steps Patient Completed: Chest;Right Arm;Left Arm;Abdomen;Front perineal area;Buttocks;Right upper leg;Left upper leg;Right lower leg (including foot);Left lower leg (including foot) Bathing: 5: Set-up assist to: Obtain items  FIM - Upper Body Dressing/Undressing Upper body dressing/undressing steps patient completed: Thread/unthread right sleeve of pullover shirt/dresss;Thread/unthread left sleeve of pullover shirt/dress;Put head through opening of pull over shirt/dress;Pull shirt over trunk Upper body dressing/undressing: 4: Steadying assist FIM - Lower Body Dressing/Undressing Lower body dressing/undressing steps patient completed: Don/Doff right sock;Don/Doff left sock Lower body dressing/undressing: 4: Steadying Assist  FIM - Toileting Toileting steps completed by patient: Adjust clothing prior to toileting;Performs perineal hygiene;Adjust clothing after toileting Toileting Assistive Devices: Grab bar or rail for support Toileting: 4: Steadying assist  FIM - Diplomatic Services operational officer Devices: Grab bars Toilet Transfers: 5-To toilet/BSC: Supervision (verbal cues/safety issues)  FIM - Banker Devices: Bed rails Bed/Chair Transfer: 5: Supine > Sit: Supervision (verbal cues/safety issues);6: Sit > Supine: No assist;4: Chair or W/C > Bed: Min A (steadying Pt. > 75%)  FIM - Locomotion: Wheelchair Locomotion: Wheelchair: 0: Activity did not occur FIM - Locomotion: Ambulation Ambulation/Gait Assistance: 4: Min assist Locomotion: Ambulation: 4: Travels 150 ft or more with minimal assistance (Pt.>75%)  Comprehension Comprehension Mode: Auditory Comprehension: 5-Understands basic 90% of the time/requires cueing < 10% of the time  Expression Expression Mode:  Verbal Expression: 2-Expresses basic 25 - 49% of the time/requires cueing 50 - 75% of the time. Uses single words/gestures.  Social Interaction Social Interaction: 3-Interacts appropriately 50 - 74% of the time - May be physically or verbally inappropriate.  Problem Solving Problem Solving: 2-Solves basic 25 - 49% of the time - needs direction more than half the time to initiate, plan or complete simple activities  Memory Memory: 2-Recognizes or recalls 25 - 49% of the time/requires cueing 51 - 75% of the time  Medical Problem List and  Plan:  1. 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 DVT Prophylaxis/Anticoagulation: Support hose. Monitor for DVT  3. Pain Management: Norco as needed . Monitor mental status  4. seizure prophylaxis. Keppra 500 mg every 12 hours. Monitor for any seizure activity  5. 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 Patient independent prior to admission- was doing very well. 6. Hypertension with history of bradycardia. Followup cardiology services as needed. Currently on no antihypertensive medications. Patient on Cozaar 50 mg daily, Lopressor 37.5 mg twice daily prior to admission. Resume beta blocker at low dose. 7. Diabetes mellitus. No current diabetic agents. Patient on Glucophage 500 mg daily prior to admission. Need to Check CBGs a.c. and at bedtime  8. Depression. Zoloft 150 mg daily. Provide emotional support and positive reinforcement       LOS (Days) 4 A FACE TO FACE EVALUATION WAS PERFORMED  Lexia Vandevender T 03/05/2012, 9:15 AM

## 2012-03-05 NOTE — Progress Notes (Signed)
Occupational Therapy Note  Patient Details  Name: Alicia Crawford MRN: [id number] Date of Birth: Aug 14, 1956 Today's Date: 03/05/2012  Pt declined OT session this am due to abdominal discomfort and requesting to remain in bed. Missed 30 min   Roney Mans [facility] 03/05/2012, 11:41 AM

## 2012-03-05 NOTE — Plan of Care (Signed)
Problem: RH KNOWLEDGE DEFICIT Goal: RH STG INCREASE KNOWLEDGE OF DIABETES Patient/ Family verbalzes maintenance of diabetes and signs and symptoms of hypo/hyperglecemia by discharge  Outcome: Not Progressing Family not available at this time for education . Goal: RH STG INCREASE KNOWLEDGE OF HYPERTENSION Patient/ Family verbalize maintenance of hypertension and the sign and symptoms of hyper/hypotension with mod assistance by discharge  Outcome: Not Progressing Family not available for education at this time.

## 2012-03-05 NOTE — Evaluation (Signed)
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 Patient was very high functioning after prior anoxic brain injury. She was driving, walking the dogs and independent. Admitted 02/25/2012 after she fell backwards on a wet ramp and struck the back of her head. She denied loss of consciousness. Patient noted headache with nausea and vomiting. CT of the head showed a large subdural hematoma. Underwent left craniotomy for evacuation of subdural hematoma 02/25/2012 per Dr. Marikay Alar. Placed on Keppra for seizure prophylaxis. Postoperatively with aphasia/ agitation and delirium. Followup cardiology services for history of hypertension with cardiac arrest in the past as well as bradycardia. Currently on no antihypertensive medications considering resuming low-dose beta blocker and close monitoring of heart rate. She is on regular diet. Occupational therapy evaluation and speech therapy completed 02/28/2012 with recommendations of physical medicine rehabilitation consult and consider inpatient rehabilitation services.  Pt presents with decreased activity tolerance, decreased functional mobility, decreased balance, decreased coordination decreased cognition/safety/awareness limiting pt's independence with leisure/community pursuits.  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 - Travel (Comment);Ashby Dawes - Flower gardening Psychosocial / Spiritual Patient agreeable to Pet Therapy: Yes Does patient have pets?: Yes Social interaction - Mood/Behavior: Cooperative Firefighter Appropriate for  Education?: Yes Recreational Therapy Orientation Orientation -Reviewed with patient: Available activity resources Strengths/Weaknesses Patient Strengths/Abilities: Willingness to participate;Active premorbidly Patient weaknesses: Physical limitations  Plan Rec Therapy Plan Is patient appropriate for Therapeutic Recreation?: Yes Rehab Potential: Good Treatment times per week: Min 1 time per week >20 minutes Estimated Length of Stay: 12 days TR Treatment/Interventions: Adaptive equipment instruction;1:1 session;Balance/vestibular training;Cognitive remediation/compensation;Community reintegration;Functional mobility training;Recreation/leisure participation;Therapeutic activities;Therapeutic exercise  Recommendations for other services: None  Discharge Criteria: Patient will be discharged from TR if patient refuses treatment 3 consecutive times without medical reason.  If treatment goals not met, if there is a change in medical status, if patient makes no progress towards goals or if patient is discharged from hospital.  The above assessment, treatment plan, treatment alternatives and goals were discussed and mutually agreed upon: by patient  Domnic Vantol 03/05/2012, 3:57 PM

## 2012-03-05 NOTE — Progress Notes (Signed)
Patient ID: Alicia Crawford, female   DOB: [birth year], 56 y.o.   MRN: [id number] Patient looks good today. She is awake and alert. She follows commands with all 4 extremities. She has good attention span. She seems to have good comprehension. She can name a pen and a Watch, but has trouble naming the second hand and trouble stating what you do with a pen. Her incision is clean dry and intact. Her repetition is much better. Overall I am pleased with her progress. It is okay to remove her staples.

## 2012-03-05 NOTE — Progress Notes (Signed)
Physical Therapy Session Note  Patient Details  Name: Alicia Crawford MRN: [id number] Date of Birth: [birth year]  Today's Date: 03/05/2012 Time: 1030-1100 30 minutes  Short Term Goals: Week 1:  PT Short Term Goal 1 (Week 1): Pt will demonstrate dynamic standing balance during functional activities with min A PT Short Term Goal 2 (Week 1): Pt will perform bed <> chair transfers with supervision PT Short Term Goal 3 (Week 1): Pt will demonstrate selective attention with mod cuing x 1 minute for functional task  Skilled Therapeutic Interventions/Progress Updates:    Berg balance test performed.  Performance limited by pt with difficulty following some directions, decreased attention < 1 minute when fatigued.  Pt able to recall she needed to get laundry with 1 questioning cue.  Pt able to gait around unit with supervision, pt seems anxious with gait and balance tasks but able to correct all LOB without assist.  Continues with difficulty following > 1 step directions and requires mod cues for focused and sustained attention.   Therapy Documentation Pain:  no c/o pain Balance: Standardized Balance Assessment Standardized Balance Assessment: Berg Balance Test Berg Balance Test Sit to Stand: Able to stand  independently using hands Standing Unsupported: Able to stand safely 2 minutes Sitting with Back Unsupported but Feet Supported on Floor or Stool: Able to sit safely and securely 2 minutes Stand to Sit: Sits safely with minimal use of hands Transfers: Able to transfer safely, minor use of hands Standing Unsupported with Eyes Closed: Able to stand 10 seconds safely Standing Ubsupported with Feet Together: Able to place feet together independently but unable to hold for 30 seconds From Standing, Reach Forward with Outstretched Arm: Can reach forward >5 cm safely (2") From Standing Position, Pick up Object from Floor: Able to pick up shoe safely and easily From Standing Position, Turn to Look  Behind Over each Shoulder: Turn sideways only but maintains balance Turn 360 Degrees: Able to turn 360 degrees safely but slowly Standing Unsupported, Alternately Place Feet on Step/Stool: Able to stand independently and complete 8 steps >20 seconds Standing Unsupported, One Foot in Front: Able to plae foot ahead of the other independently and hold 30 seconds Standing on One Leg: Tries to lift leg/unable to hold 3 seconds but remains standing independently Total Score: 42   See FIM for current functional status  Therapy/Group: Individual Therapy  Anaih Brander 03/05/2012, 10:45 AM

## 2012-03-05 NOTE — Progress Notes (Signed)
Occupational Therapy Session Note  Patient Details  Name: Alicia Crawford MRN: [id number] Date of Birth: [birth year]  Today's Date: 03/05/2012 Time: 0930-1025 Time Calculation (min): 55 min  Short Term Goals: Week 1:  OT Short Term Goal 1 (Week 1): Pt will be mod I with bathing OT Short Term Goal 2 (Week 1): Pt. will be mod I with dressing OT Short Term Goal 3 (Week 1): Pt. will be mod I with toileting OT Short Term Goal 4 (Week 1): Pt. will be mod I with toilet transfer OT Short Term Goal 5 (Week 1): Pt. will be supervision with shower transfer  Skilled Therapeutic Interventions/Progress Updates:    1:1 self care retraining : at shower level, focus on functional communication, naming objects, identifying needs, ordering lunch on the phone with choices provided and extra time (and with encouragement), functional ambulation without AD, sequencing and organizing tasks with minimal cuing (questioning to demonstrative). No LOB identified in session. Performed laundry task with min cuing for instructions, able to locate laundry room with extra time and instructional cues at beginning of session.    Therapy Documentation Precautions:  Precautions Precautions: Fall Precaution Comments: decreased safety awareness Restrictions Weight Bearing Restrictions: No Pain: No c/o pain  See FIM for current functional status  Therapy/Group: Individual Therapy  Roney Mans [facility] 03/05/2012, 10:27 AM

## 2012-03-05 NOTE — Progress Notes (Signed)
Physical Therapy Note  Patient Details  Name: Alicia Crawford MRN: [id number] Date of Birth: Jan 11, 1956 Today's Date: 03/05/2012  Time: 1430-1455 25 minutes  Pt continuously c/o headache throughout session, RN aware. Required max encouragement to participate.  Treatment session for making pudding.  Pt required max cues for attention due to being distracted and perseverating on pain.  Pt able to sequence pudding making task with contextual cues only, no verbal cues needed.  Pt able to perform dish washing and putting away with min verbal, mod-max contextual cues.  Pt only able to tolerate 25 minutes due to constant c/o pain.  Individual therapy   Norva Bowe 03/05/2012, 3:33 PM

## 2012-03-06 LAB — GLUCOSE, CAPILLARY
Glucose-Capillary: 173 mg/dL — ABNORMAL HIGH (ref 70–99)
Glucose-Capillary: 157 mg/dL — ABNORMAL HIGH (ref 70–99)

## 2012-03-06 MED ORDER — BISACODYL 10 MG RE SUPP
10.0000 mg | Freq: Every day | RECTAL | Status: DC | PRN
  Administered 2012-03-06: 10 mg via RECTAL

## 2012-03-06 MED ORDER — METFORMIN HCL 500 MG PO TABS
250.0000 mg | ORAL_TABLET | Freq: Two times a day (BID) | ORAL | Status: DC
  Administered 2012-03-06 – 2012-03-14 (×18): 250 mg via ORAL
  Filled 2012-03-06 (×23): qty 1

## 2012-03-06 MED ORDER — METHYLPHENIDATE HCL 5 MG PO TABS
5.0000 mg | ORAL_TABLET | Freq: Two times a day (BID) | ORAL | Status: DC
  Administered 2012-03-06 – 2012-03-09 (×6): 5 mg via ORAL
  Filled 2012-03-06 (×6): qty 1

## 2012-03-06 NOTE — Progress Notes (Signed)
SLP Cancellation Note  Pt missed 60 minutes of skilled SLP treatment due to fatigue and pain (RN in room removing staples upon arrival).   Docia Klar 03/06/2012, 7:44 AM

## 2012-03-06 NOTE — Progress Notes (Signed)
Speech Language Pathology Daily Session Note  Patient Details  Name: Alicia Crawford MRN: [id number] Date of Birth: 1956/09/04  Today's Date: 03/06/2012 Time: 0800-0830 Time Calculation (min): 30 min  Short Term Goals:  SLP Short Term Goal 1 (Week 1): Pt will demonstrate sustained attention to task for ~5 mins with Mod A verbal and contextual cues.  SLP Short Term Goal 2 (Week 1): Pt will demonstrate functional problem solving for functional and familiar tasks with Mod A verbal and semantic cues.  SLP Short Term Goal 3 (Week 1): Pt will express basic wants/needs during functional and familiar tasks with Mod A multimodal cueing SLP Short Term Goal 4 (Week 1): Patient will follow basic 2 step commands in functional, familiar tasks with moderate contextual cues SLP Short Term Goal 5 (Week 1): Pt will utilize swallowing compensatory strategies with Min A verbal and tactile cues.   Skilled Therapeutic Interventions: Treatment focus on attention and initiation with functional task of self-feeding. Pt required Max A verbal and visual cues to initiate self-feeding but sustained attention to meal for ~20 minutes with supervision verbal cues. Pt with intermittent moaning and yelling due to pain from constipation. Pt performed  Oral hygiene with min verbal cues for problem solving. Pt transferred from wheelchair to bed with Max A verbal and semantic cues for safety and sequencing with transfer. Pt placed back to bed so RN could administer suppository.    FIM:  Comprehension Comprehension Mode: Auditory Comprehension: 5-Understands basic 90% of the time/requires cueing < 10% of the time Expression Expression Mode: Verbal Expression: 2-Expresses basic 25 - 49% of the time/requires cueing 50 - 75% of the time. Uses single words/gestures. Social Interaction Social Interaction: 2-Interacts appropriately 25 - 49% of time - Needs frequent redirection. Problem Solving Problem Solving: 2-Solves basic 25 -  49% of the time - needs direction more than half the time to initiate, plan or complete simple activities Memory Memory: 2-Recognizes or recalls 25 - 49% of the time/requires cueing 51 - 75% of the time FIM - Eating Eating Activity: 5: Supervision/cues  Pain Reports of constipation, RN aware  Therapy/Group: Individual Therapy  Dania Marsan 03/06/2012, 9:28 AM

## 2012-03-06 NOTE — Progress Notes (Signed)
Physical Therapy Note  Patient Details  Name: Alicia Crawford MRN: [id number] Date of Birth: March 31, 1956 Today's Date: 03/06/2012  Time: 1430-1513 43 minutes  Pt c/o headache throughout treatment.  Constant moaning and c/o pain throughout.  Pt participated in playing card game "war". Pt 90% accurate with determining which card was higher.  Functional activities with mod-max A for attention to task.  Pt able to perform dynamic balance in home environment with no LOB. Pt limited by perseveration on fatigue and pain, limited activity tolerance.  Pt with low frustration tolerance especially with word finding.  Individual therapy   Charleene Callegari 03/06/2012, 3:19 PM

## 2012-03-06 NOTE — Progress Notes (Signed)
Physical Therapy Note  Patient Details  Name: Alicia Crawford MRN: [id number] Date of Birth: 10-03-1956 Today's Date: 03/06/2012  Time: 4098-1191 26 minutes  Pt c/o headache, RN aware.  Pt agreeable to get OOB with max encouragement.  Plant watering task with pt attention 60-90 seconds with min cuing.  Pt with low frustration tolerance when unable to find words to answer questions, begins whining and crying out vs stating "I don't know".  Horseshoe game with attention 30-45 seconds due to pt perseverating on headache and fatigue.  Pt required max visual and verbal cues for adding score.  Gait training with cognitive challenges, pt demos difficulty with divided attention, unable to gait and perform cognitive tasks.  Gait with speed changes with close supervision.  Pt limited by perseveration on headache and poor frustration tolerance to challenging tasks.  Individual therapy   Alicia Crawford 03/06/2012, 12:23 PM

## 2012-03-06 NOTE — Progress Notes (Signed)
Patient ID: Alicia Crawford, female   DOB: 1955-12-23, 56 y.o.   MRN: [id number] Subjective/Complaints:  Some anxiety at times. No major issues last night Review of Systems  Psychiatric/Behavioral: The patient is nervous/anxious.    Objective: Vital Signs: Blood pressure 127/81, pulse 75, temperature 98 F (36.7 C), temperature source Oral, resp. rate 20, SpO2 99.00%. No results found. Results for orders placed during the hospital encounter of 03/01/12 (from the past 72 hour(s))  GLUCOSE, CAPILLARY     Status: Abnormal   Collection Time   03/03/12  7:49 AM      Component Value Range Comment   Glucose-Capillary 127 (*) 70 - 99 (mg/dL)   GLUCOSE, CAPILLARY     Status: Abnormal   Collection Time   03/03/12 12:11 PM      Component Value Range Comment   Glucose-Capillary 146 (*) 70 - 99 (mg/dL)   GLUCOSE, CAPILLARY     Status: Abnormal   Collection Time   03/03/12  4:28 PM      Component Value Range Comment   Glucose-Capillary 195 (*) 70 - 99 (mg/dL)   GLUCOSE, CAPILLARY     Status: Abnormal   Collection Time   03/03/12  8:09 PM      Component Value Range Comment   Glucose-Capillary 209 (*) 70 - 99 (mg/dL)    Comment 1 Notify RN     GLUCOSE, CAPILLARY     Status: Abnormal   Collection Time   03/03/12 11:55 PM      Component Value Range Comment   Glucose-Capillary 129 (*) 70 - 99 (mg/dL)    Comment 1 Notify RN     GLUCOSE, CAPILLARY     Status: Abnormal   Collection Time   03/04/12  4:05 AM      Component Value Range Comment   Glucose-Capillary 147 (*) 70 - 99 (mg/dL)    Comment 1 Notify RN     GLUCOSE, CAPILLARY     Status: Abnormal   Collection Time   03/04/12  8:05 AM      Component Value Range Comment   Glucose-Capillary 159 (*) 70 - 99 (mg/dL)   GLUCOSE, CAPILLARY     Status: Abnormal   Collection Time   03/04/12 12:29 PM      Component Value Range Comment   Glucose-Capillary 180 (*) 70 - 99 (mg/dL)   GLUCOSE, CAPILLARY     Status: Abnormal   Collection Time   03/04/12  4:16 PM       Component Value Range Comment   Glucose-Capillary 162 (*) 70 - 99 (mg/dL)   GLUCOSE, CAPILLARY     Status: Abnormal   Collection Time   03/04/12  8:40 PM      Component Value Range Comment   Glucose-Capillary 173 (*) 70 - 99 (mg/dL)    Comment 1 Notify RN     GLUCOSE, CAPILLARY     Status: Abnormal   Collection Time   03/05/12  9:09 AM      Component Value Range Comment   Glucose-Capillary 173 (*) 70 - 99 (mg/dL)    Comment 1 Notify RN     GLUCOSE, CAPILLARY     Status: Abnormal   Collection Time   03/05/12 11:29 AM      Component Value Range Comment   Glucose-Capillary 213 (*) 70 - 99 (mg/dL)    Comment 1 Notify RN     GLUCOSE, CAPILLARY     Status: Abnormal   Collection Time  03/05/12  4:34 PM      Component Value Range Comment   Glucose-Capillary 154 (*) 70 - 99 (mg/dL)    Comment 1 Notify RN     GLUCOSE, CAPILLARY     Status: Abnormal   Collection Time   03/05/12  8:44 PM      Component Value Range Comment   Glucose-Capillary 212 (*) 70 - 99 (mg/dL)      HEENT: normal Cardio: RRR Resp: CTA B/L GI: BS positive Extremity:  No Edema Skin:   Wound C/D/I Neuro: Abnormal Motor 4/5 on R 5/5 on L, Abnormal FMC Ataxic/ dec FMC, Reflexes: 3+, Aphasic and Apraxic Musc/Skel:  Other swelling L scalpwound clean and intact Word finding deficits, sometimes perseverative. Folllows simple commands. Able to read the date on the calendar with extra time and cues.     Assessment/Plan: 1. Functional deficits secondary to L SDH, RHP and aphasia which require 3+ hours per day of interdisciplinary therapy in a comprehensive inpatient rehab setting. Physiatrist is providing close team supervision and 24 hour management of active medical problems listed below. Physiatrist and rehab team continue to assess barriers to discharge/monitor patient progress toward functional and medical goals. FIM: FIM - Bathing Bathing Steps Patient Completed: Chest;Right Arm;Left Arm;Abdomen;Front perineal  area;Buttocks;Right upper leg;Left upper leg;Right lower leg (including foot);Left lower leg (including foot) Bathing: 5: Set-up assist to: Obtain items  FIM - Upper Body Dressing/Undressing Upper body dressing/undressing steps patient completed: Thread/unthread left sleeve of pullover shirt/dress;Put head through opening of pull over shirt/dress;Pull shirt over trunk;Thread/unthread right sleeve of pullover shirt/dresss Upper body dressing/undressing: 5: Set-up assist to: Obtain clothing/put away FIM - Lower Body Dressing/Undressing Lower body dressing/undressing steps patient completed: Don/Doff right sock;Don/Doff left sock;Don/Doff right shoe;Don/Doff left shoe Lower body dressing/undressing: 4: Steadying Assist  FIM - Toileting Toileting steps completed by patient: Adjust clothing prior to toileting;Performs perineal hygiene;Adjust clothing after toileting Toileting Assistive Devices: Grab bar or rail for support Toileting: 4: Steadying assist  FIM - Diplomatic Services operational officer Devices: Grab bars Toilet Transfers: 5-To toilet/BSC: Supervision (verbal cues/safety issues)  FIM - Banker Devices: Bed rails Bed/Chair Transfer: 5: Chair or W/C > Bed: Supervision (verbal cues/safety issues)  FIM - Locomotion: Wheelchair Locomotion: Wheelchair: 0: Activity did not occur FIM - Locomotion: Ambulation Ambulation/Gait Assistance: 4: Min assist Locomotion: Ambulation: 5: Travels 150 ft or more with supervision/safety issues  Comprehension Comprehension Mode: Auditory Comprehension: 5-Understands basic 90% of the time/requires cueing < 10% of the time  Expression Expression Mode: Verbal Expression: 2-Expresses basic 25 - 49% of the time/requires cueing 50 - 75% of the time. Uses single words/gestures.  Social Interaction Social Interaction: 4-Interacts appropriately 75 - 89% of the time - Needs redirection for appropriate language  or to initiate interaction.  Problem Solving Problem Solving: 2-Solves basic 25 - 49% of the time - needs direction more than half the time to initiate, plan or complete simple activities  Memory Memory: 2-Recognizes or recalls 25 - 49% of the time/requires cueing 51 - 75% of the time  Medical Problem List and Plan:  1. 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 DVT Prophylaxis/Anticoagulation: Support hose. Ambulation daily.  3. Pain Management: Norco as needed . Monitor mental status  4. seizure prophylaxis. Keppra 500 mg every 12 hours. Monitor for any seizure activity  5. 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 Patient independent prior to admission- was doing very well. 6. Hypertension  with history of bradycardia. Followup cardiology services as needed. Currently on no antihypertensive medications. Patient on Cozaar 50 mg daily, Lopressor 37.5 mg twice daily prior to admission. Consider resuming beta blocker at low dose. (i decided to hold off yesterday)- on clonidine currently 7. Diabetes mellitus. No current diabetic agents. Patient on Glucophage 500 mg daily prior to admission.--will resume at 250mg  bid.  - Check CBGs a.c. and at bedtime  8. Depression. Zoloft 150 mg daily. Provide emotional support and positive reinforcement   -anxious at times.      LOS (Days) 5 A FACE TO FACE EVALUATION WAS PERFORMED  Clotilda Hafer T 03/06/2012, 7:15 AM

## 2012-03-06 NOTE — Progress Notes (Signed)
Occupational Therapy Session Note  Patient Details  Name: Alicia Crawford MRN: [id number] Date of Birth: [birth year]  Today's Date: 03/06/2012 Time: 0930-1030 Time Calculation (min): 60 min  Short Term Goals: Week 1:  OT Short Term Goal 1 (Week 1): Pt will be mod I with bathing OT Short Term Goal 2 (Week 1): Pt. will be mod I with dressing OT Short Term Goal 3 (Week 1): Pt. will be mod I with toileting OT Short Term Goal 4 (Week 1): Pt. will be mod I with toilet transfer OT Short Term Goal 5 (Week 1): Pt. will be supervision with shower transfer  Skilled Therapeutic Interventions/Progress Updates:    1:1 self care retraining at shower level: with focus on making her needs know, initiation with tasks (with mod prompting with choices), dynamic standing balance with one limb stance for LB dressing, simple task organization (needed min prompting to know to pick out clothes out of dresser and get dressed- after shower just stood there and didn't know what the next step was). Pt washed her hair on her own but decreased tolerance to activity and pain tolerance to try to dress self.  Therapy Documentation Precautions:  Precautions Precautions: Fall Precaution Comments: decreased safety awareness Restrictions Weight Bearing Restrictions: No Pain:  crying and tearful about a headache at end of session: RN made aware and gave meds, unrated, pt laid down and lights were turned off.  See FIM for current functional status  Therapy/Group: Individual Therapy  Roney Mans [facility] 03/06/2012, 10:31 AM

## 2012-03-06 NOTE — Progress Notes (Signed)
Occupational Therapy Session Note  Patient Details  Name: Alicia Crawford MRN: [id number] Date of Birth: [birth year]  Today's Date: 03/06/2012 Time: 2956-2130 Time Calculation (min): 55 min  Short Term Goals: Week 1:  OT Short Term Goal 1 (Week 1): Pt will be mod I with bathing OT Short Term Goal 2 (Week 1): Pt. will be mod I with dressing OT Short Term Goal 3 (Week 1): Pt. will be mod I with toileting OT Short Term Goal 4 (Week 1): Pt. will be mod I with toilet transfer OT Short Term Goal 5 (Week 1): Pt. will be supervision with shower transfer  Skilled Therapeutic Interventions/Progress Updates:    1:1 Focus on toileting, functional ambulation without AD. Engaged in card game Blink trying to match either color, number of objects on card or shape of object- pt needed total A to perform simple problem solving with max cuing and demonstrative cuing throughout session. Pt with decreased activityt olerance and frustration tolerance with self. Then engaged in looking through magazines to find and cut out pictures of foods she enjoys focusing on sustained attention and initation- pt required max to total A to complete task. Then when gluing pictures to a separate piece of paper - needed cuing each time to put the glue on the proper side to display the picture. Pt whinnying throughout session and requesting to go back to bed. Assisting putting items away and getting drink from fridge (of choice) to take her medicine with.  Therapy Documentation Precautions:  Precautions Precautions: Fall Precaution Comments: decreased safety awareness Restrictions Weight Bearing Restrictions: No Pain:  c/o head ache- unrated, RN notified and gave meds  See FIM for current functional status  Therapy/Group: Individual Therapy  Roney Mans [facility] 03/06/2012, 3:14 PM

## 2012-03-07 LAB — GLUCOSE, CAPILLARY
Glucose-Capillary: 140 mg/dL — ABNORMAL HIGH (ref 70–99)
Glucose-Capillary: 165 mg/dL — ABNORMAL HIGH (ref 70–99)
Glucose-Capillary: 126 mg/dL — ABNORMAL HIGH (ref 70–99)

## 2012-03-07 NOTE — Progress Notes (Signed)
Speech Language Pathology Daily Session Note  Patient Details  Name: Alicia Crawford MRN: [id number] Date of Birth: 01-Nov-1955  Today's Date: 03/07/2012 Time: 1400-1440 Time Calculation (min): 40 min  Short Term Goals:  SLP Short Term Goal 1 (Week 1): Pt will demonstrate sustained attention to task for ~5 mins with Mod A verbal and contextual cues.  SLP Short Term Goal 2 (Week 1): Pt will demonstrate functional problem solving for functional and familiar tasks with Mod A verbal and semantic cues.  SLP Short Term Goal 3 (Week 1): Pt will express basic wants/needs during functional and familiar tasks with Mod A multimodal cueing SLP Short Term Goal 4 (Week 1): Patient will follow basic 2 step commands in functional, familiar tasks with moderate contextual cues SLP Short Term Goal 5 (Week 1): Pt will utilize swallowing compensatory strategies with Min A verbal and tactile cues.   Skilled Therapeutic Interventions: Treatment focus on functional problem solving and verbal expression during kitchen task. Pt required total A multimodal contextual, semantic and verbal cues for functional problem solving and verbal expression of biographical information. Pt consistently reporting "I don't know" in a whiney voice throughout session.     FIM:  Comprehension Comprehension Mode: Auditory Comprehension: 2-Understands basic 25 - 49% of the time/requires cueing 51 - 75% of the time Expression Expression Mode: Verbal Expression: 1-Expresses basis less than 25% of the time/requires cueing greater than 75% of the time. Social Interaction Social Interaction: 1-Interacts appropriately less than 25% of the time. May be withdrawn or combative. Problem Solving Problem Solving: 1-Solves basic less than 25% of the time - needs direction nearly all the time or does not effectively solve problems and may need a restraint for safety Memory Memory: 1-Recognizes or recalls less than 25% of the time/requires cueing  greater than 75% of the time  Pain Pain Assessment Pain Assessment: 0-10 Pain Score: Asleep  Therapy/Group: Individual Therapy  Hamed Debella 03/07/2012, 4:03 PM

## 2012-03-07 NOTE — Care Management Note (Signed)
Patient ID: Alicia Crawford, female   DOB: 11-05-1955, 56 y.o.   MRN: [id number] Team Conf report to pt & daughter, Alicia Crawford:   Alethia Berthold 03/16/12  Goals supervision-min assist./cuing.  Discussed attention, anxiety, fatigue issues.  Pt's husband returning to town Friday.  Alicia Crawford & husband to come in separately next week for family ed--times still to be arranged.  Update faxed to Tilford Pillar at [employer].

## 2012-03-07 NOTE — Progress Notes (Signed)
Patient ID: Alicia Crawford, female   DOB: Jun 13, 1956, 56 y.o.   MRN: [id number] Patient ID: Alicia Crawford, female   DOB: 11-17-1955, 56 y.o.   MRN: [id number] Subjective/Complaints:  Anxiety with therapy noted. Sx worse when she's tire. Constipation didn't help. Slept well last night though and no complaints today. Review of Systems  Psychiatric/Behavioral: The patient is nervous/anxious.    Objective: Vital Signs: Blood pressure 142/92, pulse 74, temperature 98.5 F (36.9 C), temperature source Oral, resp. rate 20, SpO2 96.00%. No results found. Results for orders placed during the hospital encounter of 03/01/12 (from the past 72 hour(s))  GLUCOSE, CAPILLARY     Status: Abnormal   Collection Time   03/04/12  8:05 AM      Component Value Range Comment   Glucose-Capillary 159 (*) 70 - 99 (mg/dL)   GLUCOSE, CAPILLARY     Status: Abnormal   Collection Time   03/04/12 12:29 PM      Component Value Range Comment   Glucose-Capillary 180 (*) 70 - 99 (mg/dL)   GLUCOSE, CAPILLARY     Status: Abnormal   Collection Time   03/04/12  4:16 PM      Component Value Range Comment   Glucose-Capillary 162 (*) 70 - 99 (mg/dL)   GLUCOSE, CAPILLARY     Status: Abnormal   Collection Time   03/04/12  8:40 PM      Component Value Range Comment   Glucose-Capillary 173 (*) 70 - 99 (mg/dL)    Comment 1 Notify RN     GLUCOSE, CAPILLARY     Status: Abnormal   Collection Time   03/05/12  9:09 AM      Component Value Range Comment   Glucose-Capillary 173 (*) 70 - 99 (mg/dL)    Comment 1 Notify RN     GLUCOSE, CAPILLARY     Status: Abnormal   Collection Time   03/05/12 11:29 AM      Component Value Range Comment   Glucose-Capillary 213 (*) 70 - 99 (mg/dL)    Comment 1 Notify RN     GLUCOSE, CAPILLARY     Status: Abnormal   Collection Time   03/05/12  4:34 PM      Component Value Range Comment   Glucose-Capillary 154 (*) 70 - 99 (mg/dL)    Comment 1 Notify RN     GLUCOSE, CAPILLARY     Status: Abnormal   Collection Time   03/05/12  8:44 PM      Component Value Range Comment   Glucose-Capillary 212 (*) 70 - 99 (mg/dL)   GLUCOSE, CAPILLARY     Status: Abnormal   Collection Time   03/06/12  7:27 AM      Component Value Range Comment   Glucose-Capillary 173 (*) 70 - 99 (mg/dL)    Comment 1 Notify RN     GLUCOSE, CAPILLARY     Status: Abnormal   Collection Time   03/06/12 11:28 AM      Component Value Range Comment   Glucose-Capillary 157 (*) 70 - 99 (mg/dL)    Comment 1 Notify RN     GLUCOSE, CAPILLARY     Status: Abnormal   Collection Time   03/06/12  4:42 PM      Component Value Range Comment   Glucose-Capillary 179 (*) 70 - 99 (mg/dL)    Comment 1 Documented in Chart      Comment 2 Notify RN     GLUCOSE, CAPILLARY  Status: Abnormal   Collection Time   03/06/12 11:00 PM      Component Value Range Comment   Glucose-Capillary 117 (*) 70 - 99 (mg/dL)   GLUCOSE, CAPILLARY     Status: Abnormal   Collection Time   03/07/12  7:14 AM      Component Value Range Comment   Glucose-Capillary 151 (*) 70 - 99 (mg/dL)    Comment 1 Notify RN        HEENT: normal Cardio: RRR Resp: CTA B/L GI: BS positive Extremity:  No Edema Skin:   Wound C/D/I Neuro: Abnormal Motor 4/5 on R 5/5 on L, Abnormal FMC Ataxic/ dec FMC, Reflexes: 3+, Aphasic and Apraxic Musc/Skel:  Other swelling L scalpwound clean and intact- staples out Word finding deficits, sometimes perseverative. Folllows simple commands. Very calm and collected with me this am.    Assessment/Plan: 1. Functional deficits secondary to L SDH, RHP and aphasia which require 3+ hours per day of interdisciplinary therapy in a comprehensive inpatient rehab setting. Physiatrist is providing close team supervision and 24 hour management of active medical problems listed below. Physiatrist and rehab team continue to assess barriers to discharge/monitor patient progress toward functional and medical goals. FIM: FIM - Bathing Bathing Steps Patient  Completed: Chest;Right Arm;Left Arm;Abdomen;Front perineal area;Buttocks;Right lower leg (including foot);Left upper leg;Right upper leg;Left lower leg (including foot) Bathing: 5: Set-up assist to: Obtain items  FIM - Upper Body Dressing/Undressing Upper body dressing/undressing steps patient completed: Thread/unthread right sleeve of pullover shirt/dresss;Thread/unthread left sleeve of pullover shirt/dress;Put head through opening of pull over shirt/dress;Pull shirt over trunk Upper body dressing/undressing: 5: Supervision: Safety issues/verbal cues FIM - Lower Body Dressing/Undressing Lower body dressing/undressing steps patient completed: Thread/unthread right underwear leg;Thread/unthread left underwear leg;Pull underwear up/down;Thread/unthread right pants leg;Thread/unthread left pants leg;Pull pants up/down;Don/Doff right sock;Don/Doff left sock Lower body dressing/undressing: 5: Set-up assist to: Obtain clothing  FIM - Toileting Toileting steps completed by patient: Adjust clothing prior to toileting;Performs perineal hygiene;Adjust clothing after toileting Toileting Assistive Devices: Grab bar or rail for support Toileting: 4: Steadying assist  FIM - Diplomatic Services operational officer Devices: Grab bars Toilet Transfers: 5-To toilet/BSC: Supervision (verbal cues/safety issues);5-From toilet/BSC: Supervision (verbal cues/safety issues)  FIM - Press photographer Assistive Devices: Bed rails Bed/Chair Transfer: 6: Supine > Sit: No assist;5: Sit > Supine: Supervision (verbal cues/safety issues);5: Bed > Chair or W/C: Supervision (verbal cues/safety issues)  FIM - Locomotion: Wheelchair Locomotion: Wheelchair: 0: Activity did not occur FIM - Locomotion: Ambulation Ambulation/Gait Assistance: 4: Min assist Locomotion: Ambulation: 5: Travels 150 ft or more with supervision/safety issues  Comprehension Comprehension Mode: Auditory Comprehension:  5-Understands basic 90% of the time/requires cueing < 10% of the time  Expression Expression Mode: Verbal Expression: 2-Expresses basic 25 - 49% of the time/requires cueing 50 - 75% of the time. Uses single words/gestures.  Social Interaction Social Interaction: 2-Interacts appropriately 25 - 49% of time - Needs frequent redirection.  Problem Solving Problem Solving: 2-Solves basic 25 - 49% of the time - needs direction more than half the time to initiate, plan or complete simple activities  Memory Memory: 2-Recognizes or recalls 25 - 49% of the time/requires cueing 51 - 75% of the time  Medical Problem List and Plan:  1. 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 DVT Prophylaxis/Anticoagulation: Support hose. Ambulation daily.  3. Pain Management: Norco as needed . Monitor mental status  4. seizure prophylaxis. Keppra 500 mg every 12 hours. Monitor for  any seizure activity  5. 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 Patient independent prior to admission- was doing very well. 6. Hypertension with history of bradycardia. Followup cardiology services as needed. Currently on no antihypertensive medications. Patient on Cozaar 50 mg daily, Lopressor 37.5 mg twice daily prior to admission. Consider resuming beta blocker at low dose. (i decided to hold off yesterday)- on clonidine currently 7. Diabetes mellitus. No current diabetic agents. Increase glucophage to 500mg  bid.  - continue Check CBGs a.c. and at bedtime  8. Depression. Zoloft 150 mg daily. Provide emotional support and positive reinforcement   -anxious at times. 9. Neuropsych: anxiety,depression increased with compromised attention and language deficits.  -ritalin added which should help  -spacing and pacing therapies should benefit her also.      LOS (Days) 6 A FACE TO FACE EVALUATION WAS PERFORMED  Vaishali Baise T 03/07/2012, 7:39 AM

## 2012-03-07 NOTE — Patient Care Conference (Signed)
Inpatient RehabilitationTeam Conference Note Date: 03/06/2012   Time: 11:25 AM    Patient Name: Alicia Crawford      Medical Record Number: [id number]  Date of Birth: [birth year] Sex: Female         Room/Bed: 4025/4025-01 Payor Info: Payor: [employer]ENTRY  Plan: [facility]  Product Type: *No Product type*     Admitting Diagnosis: TBI  Admit Date/Time:  03/01/2012  6:42 PM Admission Comments: No comment available   Primary Diagnosis:  SDH (subdural hematoma) Principal Problem: SDH (subdural hematoma)  Patient Active Problem List  Diagnoses Date Noted  . SDH (subdural hematoma) 03/02/2012  . Subdural hematoma 02/27/2012  . S/P craniotomy, 02/25/12 after fall, SDH 02/27/2012  . Bradycardia, she has had baseline bradycardia prior to admission 02/27/2012  . Altered mental status 02/27/2012  . Obesity 02/27/2012  . Diabetes mellitus 02/27/2012  . CAD, cardiac arrest, CPR, CABG in [facility] Dec 2011-NL LVF by Echo, neg Nuc April 2013 02/27/2012  . Anxiety, ? residual hypoxic brain inhury from cardiac arrest 2011 02/27/2012  . Peripheral artery disease, bilta LE PVD by angio April 2013 01/18/2012  . HLD (hyperlipidemia) 01/18/2012  . HTN (hypertension) 01/18/2012  . Renal artery stenosis, Right, 75% 01/18/2012    Expected Discharge Date: Expected Discharge Date: 03/16/12  Team Members Present: Physician: Dr. Faith Rogue Case Manager Present: Melanee Spry, RN Social Worker Present: Amada Jupiter, LCSW Nurse Present: Carlean Purl, RN PT Present: Reggy Eye, PT OT Present: Roney Mans, Felipa Eth, OT SLP Present: Feliberto Gottron, SLP     Current Status/Progress Goal Weekly Team Focus  Medical   s/p left sdh, hx of anoxic injury. anxiety has been an issue as well as language deficits  improve attention and sleep cycle.   monitor for response to ritalin   Bowel/Bladder   continent of bowel and bladder  min assist      Swallow/Nutrition/ Hydration   Regular textures, thin  liquids: Min A for attention and initiation  Supervision  sustained attention and initiation with meals   ADL's   supervision with mod cuing  supervision  activity tolerance, problem solving, task organization, functional communication   Mobility   min A -supervision  supervision  activity tolerance, attention   Communication   Mod-Max A  Min A  word finding, thought organization, attention    Safety/Cognition/ Behavioral Observations  Max A  Min A  attention, problem solving, initaition    Pain   complains of headache/1 vicodin po   pain less than 2       Skin   staple discontinued to crani incision  no breakdown         *See Interdisciplinary Assessment and Plan and progress notes for long and short-term goals  Barriers to Discharge: anxiety, language deficits    Possible Resolutions to Barriers:  schedule mod, reassurance, language therapy    Discharge Planning/Teaching Needs:  Home with family to provide 24/7 supervision.      Team Discussion: Pt participating w/  txs--limited by decreased attention, increased anxiety, fatigue.  Will schedule family ed next week.   Revisions to Treatment Plan: none    Continued Need for Acute Rehabilitation Level of Care: The patient requires daily medical management by a physician with specialized training in physical medicine and rehabilitation for the following conditions: Daily direction of a multidisciplinary physical rehabilitation program to ensure safe treatment while eliciting the highest outcome that is of practical value to the patient.: Yes Daily medical management of patient  stability for increased activity during participation in an intensive rehabilitation regime.: Yes Daily analysis of laboratory values and/or radiology reports with any subsequent need for medication adjustment of medical intervention for : Post surgical problems;Neurological problems;Other  Brock Ra 03/07/2012, 6:32 PM

## 2012-03-07 NOTE — Progress Notes (Signed)
Occupational Therapy Session Note  Patient Details  Name: Alicia Crawford MRN: [id number] Date of Birth: [birth year]  Today's Date: 03/07/2012 Time: 7829-5621 Time Calculation (min): 45 min  Short Term Goals: Week 1:  OT Short Term Goal 1 (Week 1): Pt will be mod I with bathing OT Short Term Goal 2 (Week 1): Pt. will be mod I with dressing OT Short Term Goal 3 (Week 1): Pt. will be mod I with toileting OT Short Term Goal 4 (Week 1): Pt. will be mod I with toilet transfer OT Short Term Goal 5 (Week 1): Pt. will be supervision with shower transfer  Skilled Therapeutic Interventions/Progress Updates:    1:1 self care retraining at shower level. Pt with decreased verbalization and low frustration tolerance when makes a mistake with language. Pt with increased initiation with all self care tasks requiring less cuing - only needing cuing for remembering where clean clothes were- with questioning cue.  Therapy Documentation Precautions:  Precautions Precautions: Fall Precaution Comments: decreased safety awareness Restrictions Weight Bearing Restrictions: No Pain:  headache - unrated RN notified and gave meds  See FIM for current functional status  Therapy/Group: Individual Therapy  Roney Mans [facility] 03/07/2012, 11:26 AM

## 2012-03-07 NOTE — Progress Notes (Signed)
Physical Therapy Note  Patient Details  Name: Kemba Hoppes MRN: [id number] Date of Birth: 12-27-1955 Today's Date: 03/07/2012  Time: 845-940 55 minutes  No c/o pain.  Pt performed sustained attention to standing grooming tasks with min cues for up to 2 minutes.  Pt with decreased standing tolerance, required frequent seated breaks during grooming tasks.  Gait around unit with close supervision with cognitive challenges pt slows gait or stops, difficulty with divided attention.  Bean bag toss game with pt adding score with max A.  Pt with low frustration tolerance, continues to repeat "no, no" and "I can't".  Gait outdoors with supervision, frequent rests due to ? Decreased endurance vs decreased attention.  Attempt to engage pt in activity with TR with plants, pt continues to state "no" and "I can't".  Stair climbing x 2 flights with min A for safety.  Pt with no LOB with gait but seems uneasy and anxious with walking outside.  Pt limited by poor frustration and activity tolerance.  Individual therapy   Daryan Buell 03/07/2012, 12:18 PM

## 2012-03-07 NOTE — Progress Notes (Signed)
Occupational Therapy Session Note  Patient Details  Name: Keoshia Steinmetz MRN: [id number] Date of Birth: 1956-05-01  Today's Date: 03/07/2012 Time: 4098-1191 Time Calculation (min): 45 min  Short Term Goals: Week 1:  OT Short Term Goal 1 (Week 1): Pt will be mod I with bathing OT Short Term Goal 2 (Week 1): Pt. will be mod I with dressing OT Short Term Goal 3 (Week 1): Pt. will be mod I with toileting OT Short Term Goal 4 (Week 1): Pt. will be mod I with toilet transfer OT Short Term Goal 5 (Week 1): Pt. will be supervision with shower transfer  Skilled Therapeutic Interventions/Progress Updates:    1:1 cognitive retraining with simple kitchen task of making box cupcakes.  Needed encouragement to participate and for confidence through out session- her go to answer was "I cant do this" &" I dont know how to do that." focused on sustained to selective attention (for a brief time another pt was in kitchen), making choices from 2 things, functional mobility with no LOB, simple problem solving with  Mod directional/ instructional cuing. Pt needed extra time for all tasks. Max difficulty with following directions from box needing max cuing.  Therapy Documentation Precautions:  Precautions Precautions: Fall Precaution Comments: decreased safety awareness Restrictions Weight Bearing Restrictions: No Pain: Pain Assessment Pain Assessment: 0-10 Pain Score: Asleep Pain Type: Acute pain Pain Location: Head Pain Orientation: Right;Left Pain Descriptors: Aching Pain Onset: Gradual Patients Stated Pain Goal: 2 Pain Intervention(s): Medication (See eMAR);Emotional support Multiple Pain Sites: No RN aware See FIM for current functional status  Therapy/Group: Individual Therapy  Roney Mans [facility] 03/07/2012, 2:07 PM

## 2012-03-07 NOTE — Progress Notes (Signed)
Physical Therapy Note  Patient Details  Name: Jacci Ruberg MRN: [id number] Date of Birth: Nov 02, 1955 Today's Date: 03/07/2012  Time: 4098-1191 24 minutes  Pt c/o headache, medicated prior to session.  Pt required max encouragement to participate, unwilling to leave room.  Pt engaged in card game playing "war" with mod cues for attention to task, pt internally distracted by pain and fatigue.  Pt able to choose higher valued card correctly 60-70% of time.  Attempt to engage pt in social conversation, pt with little verbalization this session. Pt continues with low frustration tolerance limiting participation in therapeutic tasks.   Individual therapy  Marsden Zaino 03/07/2012, 12:14 PM

## 2012-03-07 NOTE — Progress Notes (Signed)
Speech Language Pathology Daily Session Note  Patient Details  Name: Alicia Crawford MRN: [id number] Date of Birth: [birth year]  Today's Date: 03/07/2012 Time: 4098-1191 Time Calculation (min): 40 min  Short Term Goals:  SLP Short Term Goal 1 (Week 1): Pt will demonstrate sustained attention to task for ~5 mins with Mod A verbal and contextual cues.  SLP Short Term Goal 2 (Week 1): Pt will demonstrate functional problem solving for functional and familiar tasks with Mod A verbal and semantic cues.  SLP Short Term Goal 3 (Week 1): Pt will express basic wants/needs during functional and familiar tasks with Mod A multimodal cueing SLP Short Term Goal 4 (Week 1): Patient will follow basic 2 step commands in functional, familiar tasks with moderate contextual cues SLP Short Term Goal 5 (Week 1): Pt will utilize swallowing compensatory strategies with Min A verbal and tactile cues.   Skilled Therapeutic Interventions: Treatment focus on initiation and attention with functional and familiar task and expression of wants/needs. Pt required Mod A verbal and visual cues to initiate meal and Max A to sustain attention to self-feeding task for ~2 minutes. Pt answering yes/no question appropriately but demonstrates poor frustration tolerance when having word-finding difficulty.  Pt "moaning" and reporting "I can't do this" and "you don't understand" when asked questions about biographical information. Pt assisted back to bed and reported she had a stomach ache and felt dizzy. RN made aware.     FIM:  Comprehension Comprehension Mode: Auditory Comprehension: 3-Understands basic 50 - 74% of the time/requires cueing 25 - 50%  of the time Expression Expression: 1-Expresses basis less than 25% of the time/requires cueing greater than 75% of the time. Social Interaction Social Interaction: 1-Interacts appropriately less than 25% of the time. May be withdrawn or combative. Problem Solving Problem Solving:  1-Solves basic less than 25% of the time - needs direction nearly all the time or does not effectively solve problems and may need a restraint for safety Memory Memory: 2-Recognizes or recalls 25 - 49% of the time/requires cueing 51 - 75% of the time FIM - Eating Eating Activity: 5: Supervision/cues  Pain Pt reported pain but unable to rate pain or location  Therapy/Group: Individual Therapy  Aikam Hellickson 03/07/2012, 8:47 AM

## 2012-03-07 NOTE — Progress Notes (Signed)
Recreational Therapy Session Note  Patient Details  Name: Laurell Coalson MRN: [id number] Date of Birth: 12-Mar-1956 Today's Date: 03/07/2012  Pain: no c/o Skilled Therapeutic Interventions/Progress Updates: Ambulated to solarium and outside on tiled surface looking at raised bed gardens with supervision. Pt required frequent rests due to ? decreased endurance vs decreased attention. Attempedt to engage pt in horticultural therapy task, pt continues to state "no" and "I can't". Stair climbing x 2 flights with min A for safety. Pt with no LOB with gait but seems uneasy and anxious with walking outside. Pt limited by poor frustration and activity tolerance.   Therapy/Group: Co-Treatment Cherine Drumgoole 03/07/2012, 12:38 PM

## 2012-03-08 LAB — GLUCOSE, CAPILLARY: Glucose-Capillary: 158 mg/dL — ABNORMAL HIGH (ref 70–99)

## 2012-03-08 MED ORDER — METOPROLOL TARTRATE 12.5 MG HALF TABLET
12.5000 mg | ORAL_TABLET | Freq: Two times a day (BID) | ORAL | Status: DC
  Administered 2012-03-08 (×2): 12.5 mg via ORAL
  Filled 2012-03-08 (×6): qty 1

## 2012-03-08 NOTE — Progress Notes (Signed)
Occupational Therapy Session Note  Patient Details  Name: Alicia Crawford MRN: [id number] Date of Birth: 10-Aug-1956  Today's Date: 03/08/2012 Time: 0930-1015 Time Calculation (min): 45 min  Short Term Goals: Week 1:  OT Short Term Goal 1 (Week 1): Pt will be mod I with bathing OT Short Term Goal 2 (Week 1): Pt. will be mod I with dressing OT Short Term Goal 3 (Week 1): Pt. will be mod I with toileting OT Short Term Goal 4 (Week 1): Pt. will be mod I with toilet transfer OT Short Term Goal 5 (Week 1): Pt. will be supervision with shower transfer  Skilled Therapeutic Interventions/Progress Updates:    1:1 self care retraining: at shower level. Pt with increased whinnying and low frustration tolerance requiring mod encouragement to get up out of bed to perform morning routine of bathing and dressing. Decreased demonstration of mental flexibility and problem  Solving; pt donning shirt inside out and when gently correct her before don shirt over head pt got upset throwing shirt onto bed " I dont know how to do this." encouraged her and showed her how to correct problem for her to move on. Encouraged her to eat yogurt since didn't eat breakfast  2nd session 13:00-13:45 1:1 cognitive activity of icing cupcakes made yesterday in kitchen. Pt needed max instructional cues to initiate task- despite initial cue and extra time. Pt with difficulty initiating any functional standing tasks that required being close to other therapist and patient in the kitchen (at the same time)- increased anxiousness. Pt assisted cleaning up area with min cuing once started task. Instructed to take pan of cupcakes down and offer to her RNNita Sells, pt able to hold on to instructions and with directive cue to locate RN able to offer one with extra time and external cue.  Therapy Documentation Precautions:  Precautions Precautions: Fall Precaution Comments: decreased safety awareness Restrictions Weight Bearing  Restrictions: No Vital Signs: Therapy Vitals Temp: 98 F (36.7 C) Temp src: Oral Pulse Rate: 79  Resp: 18  BP: 133/84 mmHg Patient Position, if appropriate: Lying Oxygen Therapy SpO2: 96 % O2 Device: None (Room air) Pain:  HA- unrated RN brought meds (for both sessions)   See FIM for current functional status  Therapy/Group: Individual Therapy  Roney Mans [facility] 03/08/2012, 3:34 PM

## 2012-03-08 NOTE — Progress Notes (Signed)
Speech Language Pathology Daily Session Note  Patient Details  Name: Alicia Crawford MRN: [id number] Date of Birth: 05-11-1956  Today's Date: 03/08/2012 Time: 1478-2956 Time Calculation (min): 30 min  Short Term Goals: Week 1: SLP Short Term Goal 1 (Week 1): Pt will demonstrate sustained attention to task for ~5 mins with Mod A verbal and contextual cues.  SLP Short Term Goal 2 (Week 1): Pt will demonstrate functional problem solving for functional and familiar tasks with Mod A verbal and semantic cues.  SLP Short Term Goal 3 (Week 1): Pt will express basic wants/needs during functional and familiar tasks with Mod A multimodal cueing SLP Short Term Goal 4 (Week 1): Patient will follow basic 2 step commands in functional, familiar tasks with moderate contextual cues SLP Short Term Goal 5 (Week 1): Pt will utilize swallowing compensatory strategies with Min A verbal and tactile cues.   Skilled Therapeutic Interventions: Treatment focused on sustained attention, problem solving, and verbal expression. Patient required max assist for initiation of self feeding during functional ADL, then able to sustain attention for 2-3 minutes with moderate verbal cueing. Patient became quickly agitated however, moaning, crying, perserverating on "please". Unable to calm despite max clinician attempts until allowed to close eyes and sleep. Missed 30 minutes of treatment session.    FIM:  Comprehension Comprehension Mode: Auditory Comprehension: 2-Understands basic 25 - 49% of the time/requires cueing 51 - 75% of the time Expression Expression Mode: Verbal Expression: 1-Expresses basis less than 25% of the time/requires cueing greater than 75% of the time. Social Interaction Social Interaction: 2-Interacts appropriately 25 - 49% of time - Needs frequent redirection. Problem Solving Problem Solving: 1-Solves basic less than 25% of the time - needs direction nearly all the time or does not effectively solve  problems and may need a restraint for safety Memory Memory: 1-Recognizes or recalls less than 25% of the time/requires cueing greater than 75% of the time  Pain Pain Assessment Pain Assessment: No/denies pain Pain Score: Asleep  Therapy/Group: Individual Therapy  Ferdinand Lango MA, CCC-SLP [phone]   Ferdinand Lango Meryl 03/08/2012, 9:26 AM

## 2012-03-08 NOTE — Progress Notes (Signed)
Physical Therapy Note  Patient Details  Name: Alicia Crawford MRN: [id number] Date of Birth: 29-Jul-1956 Today's Date: 03/08/2012  Time 1: 1030-1110 40 minutes  No c/o pain.  Pt participated in functional kitchen activity to ice cupcakes.  Pt required total A for initiation and problem solving.  Pt able to perform rote task of icing cupcakes, cleaning up, washing dishes with min cues for attention.  Pt performed gait throughout unit with supervision, continues with decreased cadence and wide BOS but no LOB.  Attempt to engage pt in tic-tac-toe game pt continuously states "I don't know" with increased anxiety, increased respiration rate, shallow breathing.  Allowed pt 2 minutes quiet rest then asked pt to write name.  Pt continuously states "I just don't know" and was becoming increasingly anxious and agitated.  Pt allowed to rest and then assisted to bed safely.  Pt limited by low frustration tolerance in all situations.  Individual therapy  Time 2: 1345-1420 35 minutes  No c/o pain.  Pt participated in activity of hanging clothes on clothesline.  Pt total A for problem solving with visual, verbal and gesture cues.  Pt required 5 attempts before being successful.  Sorting dishes from dishwasher pt with good balance with bending, reaching.  Required max A for sorting, decreased frustration tolerance, decreased problem solving, impaired initiation, decreased ability to self correct.  Pt limited by lability and poor frustration tolerance.  Individual therapy   Ulus Hazen 03/08/2012, 2:42 PM

## 2012-03-08 NOTE — Progress Notes (Signed)
Occupational Therapy Session Note  Patient Details  Name: Alicia Crawford MRN: [id number] Date of Birth: 1956-09-06  Today's Date: 03/08/2012 Time: 4098-1191 Time Calculation (min): 35 min   Skilled Therapeutic Interventions/Progress Updates: Patient seen for 1:1 OT session this pm to address sustained attention within meaningful functional task (making bracelet for family member.)  Patient initially moaning with task of stringing beads, but with constant cueing and very direct encouragement patient could maintain focus for 3 min in a mildly distracting environment.  Patient encouraged to show bracelet to other staff members, and patient became much more animated, even smiling and laughing.       Therapy Documentation Precautions:  Precautions Precautions: Fall Precaution Comments: decreased safety awareness Restrictions Weight Bearing Restrictions: No   Pain: No report of pain initially.  Patient mentioned on one occasion, when frustrated, that her sinuses hurt.  No further mention, nor any signs / symptoms of discomfort as patient became more engaged in the task.    See FIM for current functional status  Therapy/Group: Individual Therapy  Collier Salina 03/08/2012, 4:07 PM

## 2012-03-08 NOTE — Progress Notes (Signed)
Pt's affect fluctuates greatly and quickly; moans at times with c/o headache, consistently states "worst ever". Vicodin effective ( 1tab.), requests every 4 hours; internally distracted with level of pain.toileting issues. Brighter affect late afternoon,smiling often.  Daughter states pt. was "not a morning person" when asked of pts. normal daily routine... stayed up late, slept until 10ish. Appetite very  poor. Request to MD for Megace. Alarm for safety. Alicia Crawford

## 2012-03-08 NOTE — Progress Notes (Signed)
Subjective/Complaints: Still can be quite distracted and anxious at times with therapy. No issues overnight. Review of Systems  Psychiatric/Behavioral: The patient is nervous/anxious.    Objective: Vital Signs: Blood pressure 150/83, pulse 78, temperature 98.4 F (36.9 C), temperature source Oral, resp. rate 18, weight 88.7 kg (195 lb 8.8 oz), SpO2 98.00%. No results found. Results for orders placed during the hospital encounter of 03/01/12 (from the past 72 hour(s))  GLUCOSE, CAPILLARY     Status: Abnormal   Collection Time   03/05/12  9:09 AM      Component Value Range Comment   Glucose-Capillary 173 (*) 70 - 99 (mg/dL)    Comment 1 Notify RN     GLUCOSE, CAPILLARY     Status: Abnormal   Collection Time   03/05/12 11:29 AM      Component Value Range Comment   Glucose-Capillary 213 (*) 70 - 99 (mg/dL)    Comment 1 Notify RN     GLUCOSE, CAPILLARY     Status: Abnormal   Collection Time   03/05/12  4:34 PM      Component Value Range Comment   Glucose-Capillary 154 (*) 70 - 99 (mg/dL)    Comment 1 Notify RN     GLUCOSE, CAPILLARY     Status: Abnormal   Collection Time   03/05/12  8:44 PM      Component Value Range Comment   Glucose-Capillary 212 (*) 70 - 99 (mg/dL)   GLUCOSE, CAPILLARY     Status: Abnormal   Collection Time   03/06/12  7:27 AM      Component Value Range Comment   Glucose-Capillary 173 (*) 70 - 99 (mg/dL)    Comment 1 Notify RN     GLUCOSE, CAPILLARY     Status: Abnormal   Collection Time   03/06/12 11:28 AM      Component Value Range Comment   Glucose-Capillary 157 (*) 70 - 99 (mg/dL)    Comment 1 Notify RN     GLUCOSE, CAPILLARY     Status: Abnormal   Collection Time   03/06/12  4:42 PM      Component Value Range Comment   Glucose-Capillary 179 (*) 70 - 99 (mg/dL)    Comment 1 Documented in Chart      Comment 2 Notify RN     GLUCOSE, CAPILLARY     Status: Abnormal   Collection Time   03/06/12 11:00 PM      Component Value Range Comment   Glucose-Capillary 117  (*) 70 - 99 (mg/dL)   GLUCOSE, CAPILLARY     Status: Abnormal   Collection Time   03/07/12  7:14 AM      Component Value Range Comment   Glucose-Capillary 151 (*) 70 - 99 (mg/dL)    Comment 1 Notify RN     GLUCOSE, CAPILLARY     Status: Abnormal   Collection Time   03/07/12 11:46 AM      Component Value Range Comment   Glucose-Capillary 140 (*) 70 - 99 (mg/dL)    Comment 1 Notify RN     GLUCOSE, CAPILLARY     Status: Abnormal   Collection Time   03/07/12  5:00 PM      Component Value Range Comment   Glucose-Capillary 165 (*) 70 - 99 (mg/dL)   GLUCOSE, CAPILLARY     Status: Abnormal   Collection Time   03/07/12  8:59 PM      Component Value Range Comment  Glucose-Capillary 126 (*) 70 - 99 (mg/dL)      HEENT: normal Cardio: RRR Resp: CTA B/L GI: BS positive Extremity:  No Edema Skin:   Wound C/D/I Neuro: Abnormal Motor 4/5 on R 5/5 on L, Abnormal FMC Ataxic/ dec FMC, Reflexes: 3+, Aphasic and Apraxic Musc/Skel:  Other swelling L scalpwound clean and intact- staples out Word finding deficits, sometimes perseverative. Just waking up today.     Assessment/Plan: 1. Functional deficits secondary to L SDH, RHP and aphasia which require 3+ hours per day of interdisciplinary therapy in a comprehensive inpatient rehab setting. Physiatrist is providing close team supervision and 24 hour management of active medical problems listed below. Physiatrist and rehab team continue to assess barriers to discharge/monitor patient progress toward functional and medical goals. FIM: FIM - Bathing Bathing Steps Patient Completed: Chest;Right Arm;Left Arm;Abdomen;Front perineal area;Buttocks;Right lower leg (including foot);Left upper leg;Right upper leg;Left lower leg (including foot) Bathing: 5: Set-up assist to: Adjust water temp  FIM - Upper Body Dressing/Undressing Upper body dressing/undressing steps patient completed: Thread/unthread right sleeve of pullover shirt/dresss;Thread/unthread left  sleeve of pullover shirt/dress;Put head through opening of pull over shirt/dress;Pull shirt over trunk Upper body dressing/undressing: 5: Supervision: Safety issues/verbal cues FIM - Lower Body Dressing/Undressing Lower body dressing/undressing steps patient completed: Thread/unthread right underwear leg;Thread/unthread left underwear leg;Pull underwear up/down;Thread/unthread right pants leg;Thread/unthread left pants leg;Pull pants up/down;Don/Doff right sock;Don/Doff left sock;Don/Doff left shoe;Don/Doff right shoe Lower body dressing/undressing: 5: Supervision: Safety issues/verbal cues  FIM - Toileting Toileting steps completed by patient: Adjust clothing prior to toileting;Performs perineal hygiene;Adjust clothing after toileting Toileting Assistive Devices: Grab bar or rail for support Toileting: 5: Supervision: Safety issues/verbal cues  FIM - Diplomatic Services operational officer Devices: Grab bars Toilet Transfers: 6-More than reasonable amt of time  FIM - Banker Devices: Bed rails Bed/Chair Transfer: 6: Supine > Sit: No assist;5: Sit > Supine: Supervision (verbal cues/safety issues);5: Bed > Chair or W/C: Supervision (verbal cues/safety issues)  FIM - Locomotion: Wheelchair Locomotion: Wheelchair: 0: Activity did not occur FIM - Locomotion: Ambulation Ambulation/Gait Assistance: 4: Min assist Locomotion: Ambulation: 5: Travels 150 ft or more with supervision/safety issues  Comprehension Comprehension Mode: Auditory Comprehension: 2-Understands basic 25 - 49% of the time/requires cueing 51 - 75% of the time  Expression Expression Mode: Verbal Expression: 1-Expresses basis less than 25% of the time/requires cueing greater than 75% of the time.  Social Interaction Social Interaction: 2-Interacts appropriately 25 - 49% of time - Needs frequent redirection.  Problem Solving Problem Solving Mode: Not assessed Problem Solving:  1-Solves basic less than 25% of the time - needs direction nearly all the time or does not effectively solve problems and may need a restraint for safety  Memory Memory: 1-Recognizes or recalls less than 25% of the time/requires cueing greater than 75% of the time  Medical Problem List and Plan:  1. 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 DVT Prophylaxis/Anticoagulation: Support hose. Ambulation daily.  3. Pain Management: Norco as needed . Monitor mental status  4. seizure prophylaxis. Keppra 500 mg every 12 hours. Monitor for any seizure activity  5. 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 Patient independent prior to admission- was doing very well. 6. Hypertension with history of bradycardia. Followup cardiology services as needed. Currently on no antihypertensive medications. Patient on Cozaar 50 mg daily, Lopressor 37.5 mg twice daily prior to admission-resume at 12.5mg  bid. Prn clonidine also. 7. Diabetes mellitus. No  current diabetic agents. Increase glucophage to 500mg  bid.  - continue Check CBGs a.c. and at bedtime  8. Depression. Zoloft 150 mg daily. Provide emotional support and positive reinforcement   -anxious at times. 9. Neuropsych: anxiety,depression increased with compromised attention and language deficits.  -ritalin added which should help  -spacing and pacing therapies should benefit her also.  -continue to provide positive reinforcement      LOS (Days) 7 A FACE TO FACE EVALUATION WAS PERFORMED  Attallah Ontko T 03/08/2012, 6:17 AM

## 2012-03-09 DIAGNOSIS — Z5189 Encounter for other specified aftercare: Secondary | ICD-10-CM

## 2012-03-09 DIAGNOSIS — W19XXXA Unspecified fall, initial encounter: Secondary | ICD-10-CM

## 2012-03-09 DIAGNOSIS — S065X9A Traumatic subdural hemorrhage with loss of consciousness of unspecified duration, initial encounter: Secondary | ICD-10-CM

## 2012-03-09 DIAGNOSIS — R4701 Aphasia: Secondary | ICD-10-CM

## 2012-03-09 DIAGNOSIS — S065XAA Traumatic subdural hemorrhage with loss of consciousness status unknown, initial encounter: Secondary | ICD-10-CM

## 2012-03-09 LAB — GLUCOSE, CAPILLARY
Glucose-Capillary: 163 mg/dL — ABNORMAL HIGH (ref 70–99)
Glucose-Capillary: 155 mg/dL — ABNORMAL HIGH (ref 70–99)

## 2012-03-09 MED ORDER — METHYLPHENIDATE HCL 5 MG PO TABS
10.0000 mg | ORAL_TABLET | Freq: Two times a day (BID) | ORAL | Status: DC
  Administered 2012-03-09 – 2012-03-16 (×14): 10 mg via ORAL
  Filled 2012-03-09 (×14): qty 2

## 2012-03-09 MED ORDER — ENSURE COMPLETE PO LIQD
237.0000 mL | Freq: Two times a day (BID) | ORAL | Status: DC
  Administered 2012-03-09 – 2012-03-15 (×12): 237 mL via ORAL

## 2012-03-09 MED ORDER — METOPROLOL TARTRATE 25 MG PO TABS
25.0000 mg | ORAL_TABLET | Freq: Two times a day (BID) | ORAL | Status: DC
  Administered 2012-03-09 – 2012-03-16 (×15): 25 mg via ORAL
  Filled 2012-03-09 (×17): qty 1

## 2012-03-09 MED ORDER — MEGESTROL ACETATE 400 MG/10ML PO SUSP
400.0000 mg | Freq: Two times a day (BID) | ORAL | Status: DC
  Administered 2012-03-09 – 2012-03-16 (×15): 400 mg via ORAL
  Filled 2012-03-09 (×17): qty 10

## 2012-03-09 NOTE — Progress Notes (Signed)
Occupational Therapy Weekly Progress Note  Patient Details  Name: Alicia Crawford MRN: [id number] Date of Birth: 06/21/1956  Today's Date: 03/09/2012  Patient has met 5 of 5 short term goals.  Pt has made good gains in bathing and dressing and can perform these tasks with verbal prompt to begin tasks and then pt is able to carry out tasks with distant supervision/ modI. Due to pt's cognitive deficits  And pain demonstrates low activity tolerance - often moaning/ crying and reporting she can't do anything- when in fact she can perform many tasks in a supportive environment with mod -max cuing. Pt requires max to total A for problem solving in a functional setting. Pt can maintain attention for  at times (in ADL) however needs extra time to perform all tasks due to decreased initiation and fatigue. Pt 's behavior is still consist with [place] Level V.  Patient continues to demonstrate the following deficits:muscle weakness and sensory deficit in right LE, decreased cardiorespiratoy endurance, decreased initiation, decreased attention, decreased awareness, decreased problem solving, decreased safety awareness, decreased memory and delayed processing and therefore will continue to benefit from skilled OT intervention to enhance overall performance with BADL.  Patient progressing toward long term goals..  Continue plan of care.  OT Short Term Goals Week 1:  OT Short Term Goal 1 (Week 1): Pt will be mod I with bathing OT Short Term Goal 1 - Progress (Week 1): Met OT Short Term Goal 2 (Week 1): Pt. will be mod I with dressing OT Short Term Goal 2 - Progress (Week 1): Met OT Short Term Goal 3 (Week 1): Pt. will be mod I with toileting OT Short Term Goal 3 - Progress (Week 1): Met OT Short Term Goal 4 (Week 1): Pt. will be mod I with toilet transfer OT Short Term Goal 4 - Progress (Week 1): Met OT Short Term Goal 5 (Week 1): Pt. will be supervision with shower transfer OT Short Term Goal 5 -  Progress (Week 1): Met Week 2:  OT Short Term Goal 1 (Week 2): Family education on functional communication, behavior and cognition will be completed OT Short Term Goal 2 (Week 2): Pt will initate performing ADL with <3 prompts OT Short Term Goal 3 (Week 2): Pt will demonstrate basic problem solving in functional tasks with ,min questioning cuing  Skilled Therapeutic Interventions/Progress Updates:    same POC  Therapy Documentation Precautions:  Precautions Precautions: Fall Precaution Comments: decreased safety awareness Restrictions Weight Bearing Restrictions: No General: General Amount of Missed OT Time (min): 45 Minutes Pain: Pain Assessment Pain Assessment: 0-10 Pain Score:   1 Pain Type: Acute pain Pain Location: Head Pain Orientation: Left Pain Descriptors: Aching;Headache Pain Frequency: Occasional Pain Onset: Gradual Patients Stated Pain Goal: 2 Pain Intervention(s): Medication (See eMAR) (vicodin 1 po)  See FIM for current functional status  Therapy/Group: Individual Therapy  Roney Mans [facility] 03/09/2012, 3:29 PM

## 2012-03-09 NOTE — Progress Notes (Signed)
Patient ID: Alicia Crawford, female   DOB: [birth year], 56 y.o.   MRN: [id number] Subjective/Complaints: Poor appetite reported. Anxiety perhaps a little better. Slept well last night. Review of Systems  Psychiatric/Behavioral: The patient is nervous/anxious.    Objective: Vital Signs: Blood pressure 165/87, pulse 75, temperature 97.9 F (36.6 C), temperature source Oral, resp. rate 19, weight 82.7 kg (182 lb 5.1 oz), SpO2 96.00%. No results found. Results for orders placed during the hospital encounter of 03/01/12 (from the past 72 hour(s))  GLUCOSE, CAPILLARY     Status: Abnormal   Collection Time   03/06/12 11:28 AM      Component Value Range Comment   Glucose-Capillary 157 (*) 70 - 99 (mg/dL)    Comment 1 Notify RN     GLUCOSE, CAPILLARY     Status: Abnormal   Collection Time   03/06/12  4:42 PM      Component Value Range Comment   Glucose-Capillary 179 (*) 70 - 99 (mg/dL)    Comment 1 Documented in Chart      Comment 2 Notify RN     GLUCOSE, CAPILLARY     Status: Abnormal   Collection Time   03/06/12 11:00 PM      Component Value Range Comment   Glucose-Capillary 117 (*) 70 - 99 (mg/dL)   GLUCOSE, CAPILLARY     Status: Abnormal   Collection Time   03/07/12  7:14 AM      Component Value Range Comment   Glucose-Capillary 151 (*) 70 - 99 (mg/dL)    Comment 1 Notify RN     GLUCOSE, CAPILLARY     Status: Abnormal   Collection Time   03/07/12 11:46 AM      Component Value Range Comment   Glucose-Capillary 140 (*) 70 - 99 (mg/dL)    Comment 1 Notify RN     GLUCOSE, CAPILLARY     Status: Abnormal   Collection Time   03/07/12  5:00 PM      Component Value Range Comment   Glucose-Capillary 165 (*) 70 - 99 (mg/dL)   GLUCOSE, CAPILLARY     Status: Abnormal   Collection Time   03/07/12  8:59 PM      Component Value Range Comment   Glucose-Capillary 126 (*) 70 - 99 (mg/dL)   GLUCOSE, CAPILLARY     Status: Abnormal   Collection Time   03/08/12  7:23 AM      Component Value Range Comment     Glucose-Capillary 140 (*) 70 - 99 (mg/dL)    Comment 1 Notify RN     GLUCOSE, CAPILLARY     Status: Abnormal   Collection Time   03/08/12 11:37 AM      Component Value Range Comment   Glucose-Capillary 143 (*) 70 - 99 (mg/dL)    Comment 1 Notify RN     GLUCOSE, CAPILLARY     Status: Abnormal   Collection Time   03/08/12  4:37 PM      Component Value Range Comment   Glucose-Capillary 127 (*) 70 - 99 (mg/dL)    Comment 1 Notify RN     GLUCOSE, CAPILLARY     Status: Abnormal   Collection Time   03/08/12  9:20 PM      Component Value Range Comment   Glucose-Capillary 158 (*) 70 - 99 (mg/dL)   GLUCOSE, CAPILLARY     Status: Abnormal   Collection Time   03/09/12  7:41 AM  Component Value Range Comment   Glucose-Capillary 163 (*) 70 - 99 (mg/dL)    Comment 1 Notify RN        HEENT: normal Cardio: RRR Resp: CTA B/L GI: BS positive Extremity:  No Edema Skin:   Wound C/D/I Neuro: Abnormal Motor 4/5 on R 5/5 on L, Abnormal FMC Ataxic/ dec FMC, Reflexes: 3+, Aphasic and Apraxic Musc/Skel:  Other swelling L scalpwound clean and intact- staples out Word finding deficits, sometimes perseverative. Very calm and focused this am.     Assessment/Plan: 1. Functional deficits secondary to L SDH, RHP and aphasia which require 3+ hours per day of interdisciplinary therapy in a comprehensive inpatient rehab setting. Physiatrist is providing close team supervision and 24 hour management of active medical problems listed below. Physiatrist and rehab team continue to assess barriers to discharge/monitor patient progress toward functional and medical goals. FIM: FIM - Bathing Bathing Steps Patient Completed: Chest;Right Arm;Right upper leg;Left lower leg (including foot);Left upper leg;Left Arm;Abdomen;Front perineal area;Right lower leg (including foot);Buttocks Bathing: 5: Supervision: Safety issues/verbal cues  FIM - Upper Body Dressing/Undressing Upper body dressing/undressing steps patient  completed: Thread/unthread right sleeve of pullover shirt/dresss;Thread/unthread left sleeve of pullover shirt/dress;Put head through opening of pull over shirt/dress;Pull shirt over trunk Upper body dressing/undressing: 5: Supervision: Safety issues/verbal cues FIM - Lower Body Dressing/Undressing Lower body dressing/undressing steps patient completed: Thread/unthread right underwear leg;Thread/unthread left underwear leg;Pull underwear up/down;Thread/unthread right pants leg;Thread/unthread left pants leg;Pull pants up/down;Don/Doff left shoe;Don/Doff right shoe Lower body dressing/undressing: 5: Supervision: Safety issues/verbal cues  FIM - Toileting Toileting steps completed by patient: Adjust clothing prior to toileting;Adjust clothing after toileting;Performs perineal hygiene Toileting Assistive Devices: Grab bar or rail for support Toileting: 6: More than reasonable amount of time  FIM - Diplomatic Services operational officer Devices: Grab bars Toilet Transfers: 5-From toilet/BSC: Supervision (verbal cues/safety issues);5-To toilet/BSC: Supervision (verbal cues/safety issues)  FIM - Banker Devices: Bed rails Bed/Chair Transfer: 6: Supine > Sit: No assist;5: Sit > Supine: Supervision (verbal cues/safety issues);5: Bed > Chair or W/C: Supervision (verbal cues/safety issues);5: Chair or W/C > Bed: Supervision (verbal cues/safety issues)  FIM - Locomotion: Wheelchair Locomotion: Wheelchair: 0: Activity did not occur FIM - Locomotion: Ambulation Ambulation/Gait Assistance: 4: Min assist Locomotion: Ambulation: 5: Travels 150 ft or more with supervision/safety issues  Comprehension Comprehension Mode: Auditory Comprehension: 2-Understands basic 25 - 49% of the time/requires cueing 51 - 75% of the time  Expression Expression Mode: Verbal Expression: 1-Expresses basis less than 25% of the time/requires cueing greater than 75% of the  time.  Social Interaction Social Interaction: 2-Interacts appropriately 25 - 49% of time - Needs frequent redirection.  Problem Solving Problem Solving Mode: Not assessed Problem Solving: 1-Solves basic less than 25% of the time - needs direction nearly all the time or does not effectively solve problems and may need a restraint for safety  Memory Memory: 1-Recognizes or recalls less than 25% of the time/requires cueing greater than 75% of the time  Medical Problem List and Plan:  1. 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 DVT Prophylaxis/Anticoagulation: Support hose. Ambulation daily.  3. Pain Management: Norco as needed . Monitor mental status  4. seizure prophylaxis. Keppra 500 mg every 12 hours. Monitor for any seizure activity  5. 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 Patient independent prior to admission- was doing very well. 6. Hypertension with history of bradycardia. Followup cardiology services as needed. Currently on  no antihypertensive medications. Patient on Cozaar 50 mg daily, will increase lopressor to 25mg  bid. Prn clonidine also. 7. Diabetes mellitus. No current diabetic agents. Increased glucophage to 500mg  bid with good results, but she's really not eating much..  - continue Check CBGs a.c. and at bedtime  8. Depression. Zoloft 150 mg daily. Provide emotional support and positive reinforcement   -anxious at times. 9. Neuropsych: anxiety,depression increased with compromised attention and language deficits.  -ritalin added which should help with all of the above.---increase to 10mg    -spacing and pacing therapies should benefit her also.  -continue to provide positive reinforcement 10. FEN- megace trial  -RD eval      LOS (Days) 8 A FACE TO FACE EVALUATION WAS PERFORMED  Makaley Storts T 03/09/2012, 7:52 AM

## 2012-03-09 NOTE — Progress Notes (Signed)
Speech Language Pathology Daily Session Note  Patient Details  Name: Alicia Crawford MRN: [id number] Date of Birth: 1956/08/24  Today's Date: 03/09/2012 Time:  -     Short Term Goals: Week 1: SLP Short Term Goal 1 (Week 1): Pt will demonstrate sustained attention to task for ~5 mins with Mod A verbal and contextual cues.  SLP Short Term Goal 1 - Progress (Week 1): Progressing toward goal SLP Short Term Goal 2 (Week 1): Pt will demonstrate functional problem solving for functional and familiar tasks with Mod A verbal and semantic cues.  SLP Short Term Goal 2 - Progress (Week 1): Progressing toward goal SLP Short Term Goal 3 (Week 1): Pt will express basic wants/needs during functional and familiar tasks with Mod A multimodal cueing SLP Short Term Goal 3 - Progress (Week 1): Progressing toward goal SLP Short Term Goal 4 (Week 1): Patient will follow basic 2 step commands in functional, familiar tasks with moderate contextual cues SLP Short Term Goal 4 - Progress (Week 1): Progressing toward goal SLP Short Term Goal 5 (Week 1): Pt will utilize swallowing compensatory strategies with Min A verbal and tactile cues.  SLP Short Term Goal 5 - Progress (Week 1): Progressing toward goal  Skilled Therapeutic Interventions: [name] seen this am during breakfast with concentration on initiation, attention, language and problem solving.  She was relatively calm throughout the session but became moderately irritated/frustrated with constant questioning for situaltional and intellectual awareness, biographical info.  SLP engaged pt. in more functional activities with focus on the task rather than soley verbalizations.  [name] exhibited moderately increased responsiveness and participation with these activities needing mod-max cues for problem solving and mild-mod for sustained attention (putting lotion on dry forehead, perfume on wrists, pills in pill box).  Verbally she was willing to accurately name one item  on tray (french toast) and asked what did she want to do she stated "I know I need a shower"    FIM:  Comprehension Comprehension: 2-Understands basic 25 - 49% of the time/requires cueing 51 - 75% of the time Expression Expression: 2-Expresses basic 25 - 49% of the time/requires cueing 50 - 75% of the time. Uses single words/gestures. Social Interaction Social Interaction: 2-Interacts appropriately 25 - 49% of time - Needs frequent redirection. Problem Solving Problem Solving: 1-Solves basic less than 25% of the time - needs direction nearly all the time or does not effectively solve problems and may need a restraint for safety Memory Memory: 1-Recognizes or recalls less than 25% of the time/requires cueing greater than 75% of the time FIM - Eating Eating Activity: 5: Supervision/cues  Pain Pain Assessment Pain Assessment: No/denies pain  Therapy/Group: Individual Therapy  Royce Macadamia 03/09/2012, 9:08 AM

## 2012-03-09 NOTE — Progress Notes (Signed)
Physical Therapy Note  Patient Details  Name: Alicia Crawford MRN: [id number] Date of Birth: September 29, 1956 Today's Date: 03/09/2012  Time: 1350-1422 32 minutes  No c/o pain.  Pt at first refused therapist stating "I just can't do anything".  Pt agreeable with encouragement to participate in writing a note to her granddaughter.  Pt sat edge of bed and performed writing task with mod cues for initiation, min cues for spelling and attention to task.  Pt pleased that she could write, became very animated, smiling and laughing and engaging more verbally with therapist.  Pt then agreeable to walk with therapist.  Pt able to carry on social conversation during walk with min-mod cuing for word finding, improved divided attention as pt able to walk and converse simultaneously.  Pt with much improved affect this afternoon.  Individual therapy   Yesenia Fontenette 03/09/2012, 2:27 PM

## 2012-03-09 NOTE — Progress Notes (Signed)
INITIAL ADULT NUTRITION ASSESSMENT Date: 03/09/2012   Time: 9:57 AM  Reason for Assessment: MD Consult for Poor PO Intake  ASSESSMENT: Female 56 y.o.  Dx: SDH (subdural hematoma)  Hx:  Past Medical History  Diagnosis Date  . Myocardial infarction   . Hypertension   . Leg pain   . Anoxic brain injury 08/2010    "w/cardiac arrest"  . Rotator cuff tendonitis   . Complication of anesthesia     [place]  . High cholesterol   . Cardiac arrest 08/04/2010  . Type II diabetes mellitus   . Blood transfusion 09/2010  . Depression   . DEMENTIA   . Coronary artery disease    Past Surgical History  Procedure Date  . Ovarian cyst removal 1983  . Stomach and skin tuck 2000  . Posterior fusion lumbar spine ` 2000  . Cholecystectomy ~ 1980's  . 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 Cardiac surgery   . Back surgery   . Craniotomy 02/25/2012    Procedure: CRANIOTOMY HEMATOMA EVACUATION SUBDURAL;  Surgeon: Tia Alert, MD;  Location: [facility];  Service: Neurosurgery;  Laterality: Left;  Left craniotomy for evacuation of subdural hematoma   Related Meds:     . insulin aspart  0-15 Units Subcutaneous TID WC  . levETIRAcetam  500 mg Oral BID  . megestrol  400 mg Oral BID  . metFORMIN  250 mg Oral BID WC  . methylphenidate  10 mg Oral BID WC  . metoprolol tartrate  25 mg Oral BID  . multivitamin with minerals  1 tablet Oral Daily  . pantoprazole  40 mg Oral Q1200  . sertraline  150 mg Oral Daily  . DISCONTD: methylphenidate  5 mg Oral BID WC  . DISCONTD: metoprolol tartrate  12.5 mg Oral BID   Ht:   5\' 7"  (170.2 cm)  Wt: 182 lb 5.1 oz (82.7 kg)  Ideal Wt:    61.4 kg % Ideal Wt: 135%  Wt Readings from Last 15 Encounters:  03/08/12 182 lb 5.1 oz (82.7 kg)  02/27/12 201 lb 11.5 oz (91.5 kg)  02/27/12 201 lb 11.5 oz (91.5 kg)  01/18/12 197 lb (89.359 kg)  01/18/12 197 lb (89.359 kg)  06/23/11 187 lb (84.823 kg)  Usual Wt: 197 - 201 lb % Usual Wt: 92%; 8% x  10 days  BMI: 28.5 - overweight.  Food/Nutrition Related Hx: Regular diet PTA  Labs:  CMP     Component Value Date/Time   NA 138 03/02/2012 0635   K 3.7 03/02/2012 0635   CL 100 03/02/2012 0635   CO2 27 03/02/2012 0635   GLUCOSE 143* 03/02/2012 0635   BUN 9 03/02/2012 0635   CREATININE 0.54 03/02/2012 0635   CALCIUM 8.9 03/02/2012 0635   PROT 6.8 03/02/2012 0635   ALBUMIN 3.1* 03/02/2012 0635   AST 13 03/02/2012 0635   ALT 19 03/02/2012 0635   ALKPHOS 71 03/02/2012 0635   BILITOT 0.5 03/02/2012 0635   GFRNONAA >90 03/02/2012 0635   GFRAA >90 03/02/2012 0635   CBG (last 3)   Basename 03/09/12 0741 03/08/12 2120 03/08/12 1637  GLUCAP 163* 158* 127*    Intake/Output Summary (Last 24 hours) at 03/09/12 1021 Last data filed at 03/08/12 1700  Gross per 24 hour  Intake    480 ml  Output      0 ml  Net    480 ml  BM on 6/6  Diet Order:  General  Supplements/Tube Feeding: none  IVF:    Estimated Nutritional Needs:   Kcal: 1600 - 1800 kcal Protein:  85 - 95 grams protein Fluid:  1.8 - 2 liters daily  Pt admitted for rehab s/p large subdural hematoma. During acute admission pt underwent L craniotomy.  RD consulted for poor PO intake. Pt currently on Regular diet. Megace added 2/2 poor appetite. Eating approximately 50% of meals, per RN. Noted per RN that pt has fluctuations of mood and attention that makes it difficult for her to express what she wants to eat and if she wants to eat.  Reviewed menu with patient. Pt verbally stated "I like chocolate Ensure" and is agreeable to drinking daily.  Pt meets criteria for moderate malnutrition in the context of acute illness/injury 2/2 intake of less than 75% of estimated energy requirement x 7 days, 8% wt loss x 10 days. Current albumin is 3.1.  NUTRITION DIAGNOSIS: -Inadequate oral intake (NI-2.1).  Status: Ongoing  RELATED TO: poor appetite and variable attention  AS EVIDENCE BY: meal intake mostly 50% and  refusals.  MONITORING/EVALUATION(Goals): Goal: Pt to consume at least 75% of meals and supplements. Monitor: weights, labs, I/O's, PO intake  EDUCATION NEEDS: -Education not appropriate at this time  INTERVENTION: 1. Chocolate Ensure Complete PO BID 2. Discussed "Additional Selections" portion of menu to promote additional PO intake 3. Recommend encouragement and cueing with meals 4. RD to continue to follow nutrition care plan  Dietitian #: [phone] [id number]  DOCUMENTATION CODES Per approved criteria  -Non-severe (moderate) malnutrition in the context of acute illness/injury    Adair Laundry 03/09/2012, 9:57 AM

## 2012-03-09 NOTE — Progress Notes (Signed)
Speech Language Pathology Daily Session Note  Patient Details  Name: Alicia Crawford MRN: [id number] Date of Birth: 06/10/1956  Today's Date: 03/09/2012 Time: 0800-0845 Time Calculation (min): 45 min  Short Term Goals: Week 1: SLP Short Term Goal 1 (Week 1): Pt will demonstrate sustained attention to task for ~5 mins with Mod A verbal and contextual cues.  SLP Short Term Goal 1 - Progress (Week 1): Progressing toward goal SLP Short Term Goal 2 (Week 1): Pt will demonstrate functional problem solving for functional and familiar tasks with Mod A verbal and semantic cues.  SLP Short Term Goal 2 - Progress (Week 1): Progressing toward goal SLP Short Term Goal 3 (Week 1): Pt will express basic wants/needs during functional and familiar tasks with Mod A multimodal cueing SLP Short Term Goal 3 - Progress (Week 1): Progressing toward goal SLP Short Term Goal 4 (Week 1): Patient will follow basic 2 step commands in functional, familiar tasks with moderate contextual cues SLP Short Term Goal 4 - Progress (Week 1): Progressing toward goal SLP Short Term Goal 5 (Week 1): Pt will utilize swallowing compensatory strategies with Min A verbal and tactile cues.  SLP Short Term Goal 5 - Progress (Week 1): Progressing toward goal  Skilled Therapeutic Interventions: Pt. seen with lunch with treatment focus on languague, cognition and swallowing.  This SLP notified that dietitian is concerned she is not eating enough and inquiring as to why.  She consumed about half of her lunch and 1/3 -1/2 of breakfast.  Pt. appears depressed and very frustrated with current medical impariments which in SLP's opinion may be one reason for decreased appetite.  No oral or pharyngeal difficulties observed with either meal with only supervision cues for swallow precautions.  She was able to name 3 of 4 food items on tray without cuess needed.  Pt. with significant difficulty expressing thoughts and needs and majority of the time she  is not stimulable with max assist from SLP for verbalizations longer than short phrases in unknown contexts to listener.      FIM:  Comprehension Comprehension Mode: Auditory Comprehension: 2-Understands basic 25 - 49% of the time/requires cueing 51 - 75% of the time Expression Expression Mode: Verbal Expression: 1-Expresses basis less than 25% of the time/requires cueing greater than 75% of the time. Social Interaction Social Interaction: 2-Interacts appropriately 25 - 49% of time - Needs frequent redirection. Problem Solving Problem Solving: 2-Solves basic 25 - 49% of the time - needs direction more than half the time to initiate, plan or complete simple activities Memory Memory: 1-Recognizes or recalls less than 25% of the time/requires cueing greater than 75% of the time FIM - Eating Eating Activity: 5: Supervision/cues  Pain Pain Assessment Pain Assessment: No/denies pain PAINAD (Pain Assessment in Advanced Dementia) Breathing: normal Negative Vocalization: none Facial Expression: smiling or inexpressive Body Language: relaxed Consolability: no need to console PAINAD Score: 0   Therapy/Group: Individual Therapy  Royce Macadamia 03/09/2012, 12:25 PM

## 2012-03-09 NOTE — Progress Notes (Signed)
Occupational Therapy Session Note  Patient Details  Name: Alicia Crawford MRN: [id number] Date of Birth: 07-18-1956  Today's Date: 03/09/2012 Time:  - [phone] ( )    Short Term Goals: Week 1:  OT Short Term Goal 1 (Week 1): Pt will be mod I with bathing OT Short Term Goal 2 (Week 1): Pt. will be mod I with dressing OT Short Term Goal 3 (Week 1): Pt. will be mod I with toileting OT Short Term Goal 4 (Week 1): Pt. will be mod I with toilet transfer OT Short Term Goal 5 (Week 1): Pt. will be supervision with shower transfer  Skilled Therapeutic Interventions/Progress Updates:    1:1 self care retraining. When this OT walked in the room- pt said good morning and said " I know its time to take my shower." pt demonstrated increased initiation when given lots of extra time. Ot turned on water for a external environmental cue and the Pt got up and doff clothing, donned shower cap and walked into shower and showered all parts without cuing and when done turned off water got her towel an dried off. Pt came out of BR when through her new bag of clothing and beginning dressing without prompting with lots of extra time. Pt did need cuing to perform grooming tasks.  Therapy Documentation Precautions:  Precautions Precautions: Fall Precaution Comments: decreased safety awareness Restrictions Weight Bearing Restrictions: No Pain: Pain Assessment Pain Assessment: No/denies pain  See FIM for current functional status  Therapy/Group: Individual Therapy  Roney Mans [facility] 03/09/2012, 9:54 AM

## 2012-03-09 NOTE — Plan of Care (Signed)
Problem: RH KNOWLEDGE DEFICIT Goal: RH STG INCREASE KNOWLEDGE OF DIABETES Patient/ Family verbalzes maintenance of diabetes and signs and symptoms of hypo/hyperglecemia by discharge  Outcome: Progressing Continue to observe patient orientation to educate on diabetes .

## 2012-03-09 NOTE — Progress Notes (Signed)
Physical Therapy Note  Patient Details  Name: Alicia Crawford MRN: [id number] Date of Birth: 11/07/1955 Today's Date: 03/09/2012  Time: 1030-1109 39 minutes  No c/o pain.  Pt generally labile, crying and moaning throughout session.  Pt engaged in balance and strengthening exercises in seated and standing for B LEs with following handout for written, verbal and gesturing instructions.  Pt required max assist to follow exercise program, required constant encouragement and total A to count reps.  Pt required max cuing for attention to task.  Nu step for attention, UE/LE strength and endurance x 6 minutes with max cues for attention to task.  PT attempted to engage pt in additional activity, pt became agitated and walked out of gym and back to her room with mod I and got into bed.  Pt later apologized saying "I just can't do this".  PT offered encouragement.  Pt continues to be limited by anxiety, poor frustration tolerance.  Individual therapy   Dayonna Selbe 03/09/2012, 12:16 PM

## 2012-03-09 NOTE — Progress Notes (Signed)
Occupational Therapy Note  Patient Details  Name: [name] MRN: [id number] Date of Birth: [birth year] Today's Date: 03/09/2012  Pt missed 45 min of session secondary to declined to get out of bed despite numerous attempts and activities. Pt said "I need some space and I need you to leave please." RN aware. No reports of pain    Roney Mans [facility] 03/09/2012, 1:57 PM

## 2012-03-09 NOTE — Progress Notes (Signed)
Physical Therapy Weekly Progress Note  Patient Details  Name: Alicia Crawford MRN: [id number] Date of Birth: 06-23-1956  Today's Date: 03/09/2012  Patient has met 3 of 3 short term goals.  She has improved balance, mobility, activity tolerance, gait, and attention and is now Mod I in room for short distance gait and transfers.  Pt continues to gait with wide BOS and is anxious with gait in household and community settings, will continue to benefit from further balance training to reduce falls at home.  Patient continues to demonstrate the following deficits: impaired language abilities, decreased initiation, decreased awareness, decreased attention, impaired problem solving, decreased frustration tolerance and therefore will continue to benefit from skilled PT intervention to enhance overall performance with activity tolerance, balance, ability to compensate for deficits, attention, awareness and coordination.  Patient progressing toward long term goals.. Goals upgraded to mod I for mobility and gait.  Continue plan of care.  PT Short Term Goals Week 1:  PT Short Term Goal 1 (Week 1): Pt will demonstrate dynamic standing balance during functional activities with min A PT Short Term Goal 1 - Progress (Week 1): Met PT Short Term Goal 2 (Week 1): Pt will perform bed <> chair transfers with supervision PT Short Term Goal 2 - Progress (Week 1): Met PT Short Term Goal 3 (Week 1): Pt will demonstrate selective attention with mod cuing x 1 minute for functional task PT Short Term Goal 3 - Progress (Week 1): Met  Skilled Therapeutic Interventions/Progress Updates:  Ambulation/gait training;Balance/vestibular training;Cognitive remediation/compensation;Discharge planning;DME/adaptive equipment instruction;Neuromuscular re-education;Patient/family education;Functional mobility training;UE/LE Coordination activities;Wheelchair propulsion/positioning;UE/LE Strength taining/ROM;Therapeutic Exercise;Pain  management;Stair training;Therapeutic Activities;Community reintegration   See FIM for current functional status   Lindzy Rupert 03/09/2012, 3:56 PM

## 2012-03-10 LAB — GLUCOSE, CAPILLARY
Glucose-Capillary: 126 mg/dL — ABNORMAL HIGH (ref 70–99)
Glucose-Capillary: 174 mg/dL — ABNORMAL HIGH (ref 70–99)
Glucose-Capillary: 161 mg/dL — ABNORMAL HIGH (ref 70–99)

## 2012-03-10 NOTE — Progress Notes (Signed)
Subjective/Complaints: No new complaints Poor appetite reported. Anxiety perhaps a little better. Slept well last night. Review of Systems  Psychiatric/Behavioral: The patient is nervous/anxious.    Objective: Vital Signs: Blood pressure 149/91, pulse 74, temperature 98 F (36.7 C), temperature source Oral, resp. rate 17, weight 192 lb 7.4 oz (87.3 kg), SpO2 98.00%. No results found. Results for orders placed during the hospital encounter of 03/01/12 (from the past 72 hour(s))  GLUCOSE, CAPILLARY     Status: Abnormal   Collection Time   03/07/12 11:46 AM      Component Value Range Comment   Glucose-Capillary 140 (*) 70 - 99 (mg/dL)    Comment 1 Notify RN     GLUCOSE, CAPILLARY     Status: Abnormal   Collection Time   03/07/12  5:00 PM      Component Value Range Comment   Glucose-Capillary 165 (*) 70 - 99 (mg/dL)   GLUCOSE, CAPILLARY     Status: Abnormal   Collection Time   03/07/12  8:59 PM      Component Value Range Comment   Glucose-Capillary 126 (*) 70 - 99 (mg/dL)   GLUCOSE, CAPILLARY     Status: Abnormal   Collection Time   03/08/12  7:23 AM      Component Value Range Comment   Glucose-Capillary 140 (*) 70 - 99 (mg/dL)    Comment 1 Notify RN     GLUCOSE, CAPILLARY     Status: Abnormal   Collection Time   03/08/12 11:37 AM      Component Value Range Comment   Glucose-Capillary 143 (*) 70 - 99 (mg/dL)    Comment 1 Notify RN     GLUCOSE, CAPILLARY     Status: Abnormal   Collection Time   03/08/12  4:37 PM      Component Value Range Comment   Glucose-Capillary 127 (*) 70 - 99 (mg/dL)    Comment 1 Notify RN     GLUCOSE, CAPILLARY     Status: Abnormal   Collection Time   03/08/12  9:20 PM      Component Value Range Comment   Glucose-Capillary 158 (*) 70 - 99 (mg/dL)   GLUCOSE, CAPILLARY     Status: Abnormal   Collection Time   03/09/12  7:41 AM      Component Value Range Comment   Glucose-Capillary 163 (*) 70 - 99 (mg/dL)    Comment 1 Notify RN     GLUCOSE, CAPILLARY      Status: Abnormal   Collection Time   03/09/12 11:52 AM      Component Value Range Comment   Glucose-Capillary 155 (*) 70 - 99 (mg/dL)    Comment 1 Notify RN     GLUCOSE, CAPILLARY     Status: Abnormal   Collection Time   03/09/12  4:13 PM      Component Value Range Comment   Glucose-Capillary 161 (*) 70 - 99 (mg/dL)   GLUCOSE, CAPILLARY     Status: Abnormal   Collection Time   03/09/12  9:06 PM      Component Value Range Comment   Glucose-Capillary 175 (*) 70 - 99 (mg/dL)   GLUCOSE, CAPILLARY     Status: Abnormal   Collection Time   03/10/12  7:46 AM      Component Value Range Comment   Glucose-Capillary 126 (*) 70 - 99 (mg/dL)    Comment 1 Notify RN        HEENT: normal Cardio: RRR Resp:  CTA B/L GI: BS positive Extremity:  No Edema Skin:   Wound C/D/I Neuro: Abnormal Motor 4/5 on R 5/5 on L, Abnormal FMC Ataxic/ dec FMC, Reflexes: 3+, Aphasic and Apraxic Musc/Skel:  Other swelling L scalpwound clean and intact- staples out Word finding deficits, sometimes perseverative. Very calm this am.     Assessment/Plan: 1. Functional deficits secondary to L SDH, RHP and aphasia which require 3+ hours per day of interdisciplinary therapy in a comprehensive inpatient rehab setting. Physiatrist is providing close team supervision and 24 hour management of active medical problems listed below. Physiatrist and rehab team continue to assess barriers to discharge/monitor patient progress toward functional and medical goals. FIM: FIM - Bathing Bathing Steps Patient Completed: Chest;Right Arm;Right upper leg;Left lower leg (including foot);Left upper leg;Left Arm;Abdomen;Front perineal area;Right lower leg (including foot);Buttocks Bathing: 6: More than reasonable amount of time  FIM - Upper Body Dressing/Undressing Upper body dressing/undressing steps patient completed: Thread/unthread right sleeve of pullover shirt/dresss;Thread/unthread left sleeve of pullover shirt/dress;Put head through  opening of pull over shirt/dress;Pull shirt over trunk Upper body dressing/undressing: 6: More than reasonable amount of time FIM - Lower Body Dressing/Undressing Lower body dressing/undressing steps patient completed: Thread/unthread right underwear leg;Thread/unthread left underwear leg;Pull underwear up/down;Thread/unthread right pants leg;Thread/unthread left pants leg;Pull pants up/down;Don/Doff left shoe;Don/Doff right shoe Lower body dressing/undressing: 6: More than reasonable amount of time  FIM - Toileting Toileting steps completed by patient: Adjust clothing prior to toileting;Performs perineal hygiene;Adjust clothing after toileting Toileting Assistive Devices: Grab bar or rail for support Toileting: 6: More than reasonable amount of time  FIM - Diplomatic Services operational officer Devices: Therapist, music Transfers: 7-Independent: No helper  FIM - Banker Devices: Bed rails Bed/Chair Transfer: 6: Bed > Chair or W/C: No assist  FIM - Locomotion: Wheelchair Locomotion: Wheelchair: 0: Activity did not occur FIM - Locomotion: Ambulation Ambulation/Gait Assistance: 4: Min assist Locomotion: Ambulation: 6: Travels 150 ft or more independently/takes more than reasonable amount of time  Comprehension Comprehension Mode: Auditory Comprehension: 2-Understands basic 25 - 49% of the time/requires cueing 51 - 75% of the time  Expression Expression Mode: Verbal Expression: 1-Expresses basis less than 25% of the time/requires cueing greater than 75% of the time.  Social Interaction Social Interaction: 2-Interacts appropriately 25 - 49% of time - Needs frequent redirection.  Problem Solving Problem Solving Mode: Not assessed Problem Solving: 2-Solves basic 25 - 49% of the time - needs direction more than half the time to initiate, plan or complete simple activities  Memory Memory: 1-Recognizes or recalls less than 25% of the  time/requires cueing greater than 75% of the time  Medical Problem List and Plan:  1. 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 DVT Prophylaxis/Anticoagulation: Support hose. Ambulation daily.  3. Pain Management: Norco as needed . Monitor mental status  4. seizure prophylaxis. Keppra 500 mg every 12 hours. Monitor for any seizure activity  5. 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 Patient independent prior to admission- was doing very well. 6. Hypertension with history of bradycardia. Followup cardiology services as needed. Currently on no antihypertensive medications. Patient on Cozaar 50 mg daily, will increase lopressor to 25mg  bid. Prn clonidine also. 7. Diabetes mellitus. No current diabetic agents. Increased glucophage to 500mg  bid with good results, but she's really not eating much..  - continue Check CBGs a.c. and at bedtime  8. Depression. Zoloft 150 mg daily. Provide emotional support and positive  reinforcement   -anxious at times. 9. Neuropsych: anxiety,depression increased with compromised attention and language deficits.  -ritalin added which should help with all of the above.---increase to 10mg    -spacing and pacing therapies should benefit her also.  -continue to provide positive reinforcement 10. FEN- megace trial  -RD eval      LOS (Days) 9 A FACE TO FACE EVALUATION WAS PERFORMED  Alex Marcellis Frampton 03/10/2012, 8:50 AM

## 2012-03-10 NOTE — Progress Notes (Signed)
Occupational Therapy Session Note  Patient Details  Name: Alicia Crawford MRN: [id number] Date of Birth: July 02, 1956  Today's Date: 03/10/2012 Time: 1430-1500 Time Calculation (min): 30 min  Short Term Goals: Week 1:  OT Short Term Goal 1 (Week 1): Pt will be mod I with bathing OT Short Term Goal 1 - Progress (Week 1): Met OT Short Term Goal 2 (Week 1): Pt. will be mod I with dressing OT Short Term Goal 2 - Progress (Week 1): Met OT Short Term Goal 3 (Week 1): Pt. will be mod I with toileting OT Short Term Goal 3 - Progress (Week 1): Met OT Short Term Goal 4 (Week 1): Pt. will be mod I with toilet transfer OT Short Term Goal 4 - Progress (Week 1): Met OT Short Term Goal 5 (Week 1): Pt. will be supervision with shower transfer OT Short Term Goal 5 - Progress (Week 1): Met  Week 2:  OT Short Term Goal 1 (Week 2): Family education on functional communication, behavior and cognition will be completed OT Short Term Goal 2 (Week 2): Pt will initate performing ADL with <3 prompts OT Short Term Goal 3 (Week 2): Pt will demonstrate basic problem solving in functional tasks with ,min questioning cuing  Skilled Therapeutic Interventions/Progress Updates:  No complaints of pain Patient found supine in bed upon entering room. Therapist introduced self and patient able to introduce herself and her husband (who was present in room) with questioning cues. Therapist then gave patient 2 options of playing game or getting dressed, patient unable to make discission alone; husband helped in discission making. Patient performed bed mobility independently and able to donn socks & shoes with set-up assist, then patient stated she had to use bathroom. Patient ambulated to bathroom and able to perform transfer and toileting independently. Patient then picked out a shirt to wear and put dirty clothes into pile. Patient then gathered all dirty clothing with min verbal cues (as a reminder as to what we were doing next)  and ambulated -> patient laundry area. Patient put clothes in washer and able to complete tasks as directed with min verbal cues (laudry size, water temperature, putting in laundry detergent). Therapist then had patient sit in dayroom for word finding task using Connect game. After word find task therapist walked with patient back to room and left patient in bed.   Precautions:  Precautions Precautions: Fall Precaution Comments: decreased safety awareness Restrictions Weight Bearing Restrictions: No  See FIM for current functional status  Therapy/Group: Individual Therapy  Jaidan Prevette 03/10/2012, 3:59 PM

## 2012-03-10 NOTE — Progress Notes (Signed)
Speech Language Pathology Daily Session Note  Patient Details  Name: Alicia Crawford MRN: [id number] Date of Birth: [birth year]  Today's Date: 03/10/2012 Time: 6213-0865 Time Calculation (min): 30 min  Short Term Goals: Week 1: SLP Short Term Goal 1 (Week 1): Pt will demonstrate sustained attention to task for ~5 mins with Mod A verbal and contextual cues.  SLP Short Term Goal 1 - Progress (Week 1): Progressing toward goal SLP Short Term Goal 2 (Week 1): Pt will demonstrate functional problem solving for functional and familiar tasks with Mod A verbal and semantic cues.  SLP Short Term Goal 2 - Progress (Week 1): Progressing toward goal SLP Short Term Goal 3 (Week 1): Pt will express basic wants/needs during functional and familiar tasks with Mod A multimodal cueing SLP Short Term Goal 3 - Progress (Week 1): Progressing toward goal SLP Short Term Goal 4 (Week 1): Patient will follow basic 2 step commands in functional, familiar tasks with moderate contextual cues SLP Short Term Goal 4 - Progress (Week 1): Progressing toward goal SLP Short Term Goal 5 (Week 1): Pt will utilize swallowing compensatory strategies with Min A verbal and tactile cues.  SLP Short Term Goal 5 - Progress (Week 1): Progressing toward goal  Skilled Therapeutic Interventions: Treatment focus on cognition during a BADL task, self feeding.  SLP facilitated session by reducing environmental distractions and providing contextual cues with increased wait time to initiate set up and cues x2 to sequence/problem solve putting condiments on prior to eating as well as initiating opening drink.  Patient consumed 100% of breakfast with no overt s/s of aspiraiton.  Following meal patient became tearful and frustrated and SLP provided max assist multi modal cues to express need for pain medication and then requested SLP go.     FIM:  Comprehension Comprehension Mode: Auditory Comprehension: 2-Understands basic 25 - 49% of the  time/requires cueing 51 - 75% of the time Expression Expression: 1-Expresses basis less than 25% of the time/requires cueing greater than 75% of the time. Social Interaction Social Interaction: 2-Interacts appropriately 25 - 49% of time - Needs frequent redirection. Problem Solving Problem Solving: 2-Solves basic 25 - 49% of the time - needs direction more than half the time to initiate, plan or complete simple activities Memory Memory: 1-Recognizes or recalls less than 25% of the time/requires cueing greater than 75% of the time FIM - Eating Eating Activity: 5: Supervision/cues  Pain Pain Assessment Pain Assessment: Faces Faces Pain Scale: Hurts whole lot Pain Type: Acute pain Pain Location: Head Pain Orientation: Left Pain Descriptors: Headache Pain Onset: Gradual Patients Stated Pain Goal: 2 Pain Intervention(s): RN made aware;Emotional support Multiple Pain Sites: No  Therapy/Group: Individual Therapy  Alicia Ferretti., CCC-SLP [phone]  Alicia Crawford 03/10/2012, 8:54 AM

## 2012-03-11 LAB — GLUCOSE, CAPILLARY: Glucose-Capillary: 135 mg/dL — ABNORMAL HIGH (ref 70–99)

## 2012-03-11 NOTE — Progress Notes (Signed)
Occupational Therapy Weekly Progress Note  Patient Details  Name: Alicia Crawford MRN: [id number] Date of Birth: 02/28/1956  Today's Date: 03/11/2012 Time:  - 1100-1200  (60 min)  Pain= 8/10 head ache.  0800);    See Mar.  Later session, pain was 5/10 (head)  OT Short Term Goals No short term goals set  Skilled Therapeutic Interventions/Progress Updates:    Pt. Stated head pain was bad.  OT reported that I would come back later.  Pt. Ambulated to toilet and then to shower.  Pt came  out of the the shower and needed assist to put down towels for water on floor.  Pt. Dressed in same clothes even with encouragement to put on some clean clothes.  She needed cues to iniatiate grooming tasks.  After session, she went back to bed.  Therapy Documentation Precautions:  Precautions Precautions: Fall Precaution Comments: decreased safety awareness Restrictions Weight Bearing Restrictions: No       See FIM for current functional status  Therapy/Group: Individual Therapy  Humberto Seals 03/11/2012, 8:20 AM

## 2012-03-11 NOTE — Progress Notes (Signed)
Patient ID: Alicia Crawford, female   DOB: May 28, 1956, 56 y.o.   MRN: [id number] Subjective/Complaints: No new complaints Appetite is better. Anxiety is a little better. Slept well last night. Review of Systems  Psychiatric/Behavioral: The patient is nervous/anxious.    Objective: Vital Signs: Blood pressure 141/87, pulse 82, temperature 97.8 F (36.6 C), temperature source Oral, resp. rate 18, weight 192 lb 7.4 oz (87.3 kg), SpO2 98.00%. No results found. Results for orders placed during the hospital encounter of 03/01/12 (from the past 72 hour(s))  GLUCOSE, CAPILLARY     Status: Abnormal   Collection Time   03/08/12 11:37 AM      Component Value Range Comment   Glucose-Capillary 143 (*) 70 - 99 (mg/dL)    Comment 1 Notify RN     GLUCOSE, CAPILLARY     Status: Abnormal   Collection Time   03/08/12  4:37 PM      Component Value Range Comment   Glucose-Capillary 127 (*) 70 - 99 (mg/dL)    Comment 1 Notify RN     GLUCOSE, CAPILLARY     Status: Abnormal   Collection Time   03/08/12  9:20 PM      Component Value Range Comment   Glucose-Capillary 158 (*) 70 - 99 (mg/dL)   GLUCOSE, CAPILLARY     Status: Abnormal   Collection Time   03/09/12  7:41 AM      Component Value Range Comment   Glucose-Capillary 163 (*) 70 - 99 (mg/dL)    Comment 1 Notify RN     GLUCOSE, CAPILLARY     Status: Abnormal   Collection Time   03/09/12 11:52 AM      Component Value Range Comment   Glucose-Capillary 155 (*) 70 - 99 (mg/dL)    Comment 1 Notify RN     GLUCOSE, CAPILLARY     Status: Abnormal   Collection Time   03/09/12  4:13 PM      Component Value Range Comment   Glucose-Capillary 161 (*) 70 - 99 (mg/dL)   GLUCOSE, CAPILLARY     Status: Abnormal   Collection Time   03/09/12  9:06 PM      Component Value Range Comment   Glucose-Capillary 175 (*) 70 - 99 (mg/dL)   GLUCOSE, CAPILLARY     Status: Abnormal   Collection Time   03/10/12  7:46 AM      Component Value Range Comment   Glucose-Capillary 126  (*) 70 - 99 (mg/dL)    Comment 1 Notify RN     GLUCOSE, CAPILLARY     Status: Abnormal   Collection Time   03/10/12 11:21 AM      Component Value Range Comment   Glucose-Capillary 164 (*) 70 - 99 (mg/dL)    Comment 1 Notify RN     GLUCOSE, CAPILLARY     Status: Abnormal   Collection Time   03/10/12  4:39 PM      Component Value Range Comment   Glucose-Capillary 174 (*) 70 - 99 (mg/dL)   GLUCOSE, CAPILLARY     Status: Abnormal   Collection Time   03/10/12  9:42 PM      Component Value Range Comment   Glucose-Capillary 161 (*) 70 - 99 (mg/dL)    Comment 1 Notify RN     GLUCOSE, CAPILLARY     Status: Abnormal   Collection Time   03/11/12  7:20 AM      Component Value Range Comment   Glucose-Capillary  135 (*) 70 - 99 (mg/dL)    Comment 1 Notify RN        HEENT: normal Cardio: RRR Resp: CTA B/L GI: BS positive Extremity:  No Edema Skin:   Wound C/D/I Neuro: Abnormal Motor 4/5 on R 5/5 on L, Abnormal FMC Ataxic/ dec FMC, Reflexes: 3+, Aphasic and Apraxic Musc/Skel:  Other swelling L scalpwound clean and intact- staples out Word finding deficits, sometimes perseverative. Very calm this am.     Assessment/Plan: 1. Functional deficits secondary to L SDH, RHP and aphasia which require 3+ hours per day of interdisciplinary therapy in a comprehensive inpatient rehab setting. Physiatrist is providing close team supervision and 24 hour management of active medical problems listed below. Physiatrist and rehab team continue to assess barriers to discharge/monitor patient progress toward functional and medical goals. FIM: FIM - Bathing Bathing Steps Patient Completed: Chest;Right Arm;Right upper leg;Left lower leg (including foot);Left upper leg;Left Arm;Abdomen;Front perineal area;Right lower leg (including foot);Buttocks Bathing: 6: More than reasonable amount of time  FIM - Upper Body Dressing/Undressing Upper body dressing/undressing steps patient completed: Thread/unthread right sleeve  of pullover shirt/dresss;Thread/unthread left sleeve of pullover shirt/dress;Put head through opening of pull over shirt/dress;Pull shirt over trunk Upper body dressing/undressing: 6: More than reasonable amount of time FIM - Lower Body Dressing/Undressing Lower body dressing/undressing steps patient completed: Thread/unthread right underwear leg;Thread/unthread left underwear leg;Pull underwear up/down;Thread/unthread right pants leg;Thread/unthread left pants leg;Pull pants up/down;Don/Doff left shoe;Don/Doff right shoe Lower body dressing/undressing: 6: More than reasonable amount of time  FIM - Toileting Toileting steps completed by patient: Adjust clothing prior to toileting;Performs perineal hygiene;Adjust clothing after toileting Toileting Assistive Devices: Grab bar or rail for support Toileting: 6: More than reasonable amount of time  FIM - Diplomatic Services operational officer Devices: Therapist, music Transfers: 7-Independent: No helper  FIM - Banker Devices: Bed rails Bed/Chair Transfer: 6: Bed > Chair or W/C: No assist  FIM - Locomotion: Wheelchair Locomotion: Wheelchair: 0: Activity did not occur FIM - Locomotion: Ambulation Ambulation/Gait Assistance: 4: Min assist Locomotion: Ambulation: 6: Travels 150 ft or more independently/takes more than reasonable amount of time  Comprehension Comprehension Mode: Auditory Comprehension: 2-Understands basic 25 - 49% of the time/requires cueing 51 - 75% of the time  Expression Expression Mode: Verbal Expression: 1-Expresses basis less than 25% of the time/requires cueing greater than 75% of the time.  Social Interaction Social Interaction: 2-Interacts appropriately 25 - 49% of time - Needs frequent redirection.  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 DVT Prophylaxis/Anticoagulation: Support hose. Ambulation daily.  3. Pain Management: Norco as needed . Monitor mental status  4. seizure prophylaxis. Keppra 500 mg every 12 hours. Monitor for any seizure activity  5. 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 Patient independent prior to admission- was doing very well. 6. Hypertension with history of bradycardia. Followup cardiology services as needed. Currently on no antihypertensive medications. Patient on Cozaar 50 mg daily, will increase lopressor to 25mg  bid. Prn clonidine also. 7. Diabetes mellitus. No current diabetic agents. Increased glucophage to 500mg  bid with good results, but she's  really not eating much..  - continue Check CBGs a.c. and at bedtime  8. Depression. Zoloft 150 mg daily. Provide emotional support and positive reinforcement   -anxious at times. 9. Neuropsych: anxiety,depression increased with compromised attention and language deficits.  -ritalin added which should help with all of the above.---increase to 10mg    -spacing and pacing therapies should benefit her also.  -continue to provide positive reinforcement 10. FEN- megace trial  -RD eval      LOS (Days) 10 A FACE TO FACE EVALUATION WAS PERFORMED  Alex Hayden Mabin 03/11/2012, 8:20 AM

## 2012-03-12 DIAGNOSIS — W19XXXA Unspecified fall, initial encounter: Secondary | ICD-10-CM

## 2012-03-12 DIAGNOSIS — Z5189 Encounter for other specified aftercare: Secondary | ICD-10-CM

## 2012-03-12 DIAGNOSIS — S065X9A Traumatic subdural hemorrhage with loss of consciousness of unspecified duration, initial encounter: Secondary | ICD-10-CM

## 2012-03-12 DIAGNOSIS — R4701 Aphasia: Secondary | ICD-10-CM

## 2012-03-12 DIAGNOSIS — S065XAA Traumatic subdural hemorrhage with loss of consciousness status unknown, initial encounter: Secondary | ICD-10-CM

## 2012-03-12 LAB — GLUCOSE, CAPILLARY
Glucose-Capillary: 180 mg/dL — ABNORMAL HIGH (ref 70–99)
Glucose-Capillary: 178 mg/dL — ABNORMAL HIGH (ref 70–99)

## 2012-03-12 MED ORDER — TOPIRAMATE 25 MG PO TABS
25.0000 mg | ORAL_TABLET | Freq: Every day | ORAL | Status: DC
  Administered 2012-03-12: 25 mg via ORAL
  Filled 2012-03-12 (×3): qty 1

## 2012-03-12 NOTE — Progress Notes (Signed)
Speech Language Pathology Weekly Progress Note  Patient Details  Name: Alicia Crawford MRN: [id number] Date of Birth: 1955/11/26  Today's Date: 03/12/2012 Time: 1345-1420 Time Calculation (min): 35 min  Short Term Goals: Week 1:  SLP Short Term Goal 1 (Week 1): Pt will demonstrate sustained attention to task for ~5 mins with Mod A verbal and contextual cues.  SLP Short Term Goal 1 - Progress (Week 1): Met SLP Short Term Goal 2 (Week 1): Pt will demonstrate functional problem solving for functional and familiar tasks with Mod A verbal and semantic cues.  SLP Short Term Goal 2 - Progress (Week 1): Not met SLP Short Term Goal 3 (Week 1): Pt will express basic wants/needs during functional and familiar tasks with Mod A multimodal cueing SLP Short Term Goal 3 - Progress (Week 1): Not met SLP Short Term Goal 4 (Week 1): Patient will follow basic 2 step commands in functional, familiar tasks with moderate contextual cues SLP Short Term Goal 4 - Progress (Week 1): Met SLP Short Term Goal 5 (Week 1): Pt will utilize swallowing compensatory strategies with Min A verbal and tactile cues.  SLP Short Term Goal 5 - Progress (Week 1): Met  Weekly Progress Updates: [name] has demonstrated functional improvements in her overall sustained attention, abilty to process information and follow basic steps within a familiar ADL as well as initiate verbal responses at a phrasal level.  She continues to demonstrate the need for further skilled SLP services as she requires maximum assist for functional communication of basic wants and need due to the impact of both cognitive impairments (attention, working memory) as well as linguistic (word finding deficits).  Patient continues to demonstrate the need for 24/hr a day assistance at time of discharge.    SLP Frequency: 1-2 X/day, 30-60 minutes Estimated Length of Stay: 2 weeks SLP Treatment/Interventions: Cognitive remediation/compensation;Cueing  hierarchy;Dysphagia/aspiration precaution training;Environmental controls;Therapeutic Exercise;Therapeutic Activities;Multimodal communication approach;Speech/Language facilitation;Patient/family education;Internal/external aids;Functional tasks  Daily Session FIM:  FIM - Eating Eating Activity: 5: Supervision/cues   Pain Pain Assessment Pain Assessment: No/denies pain Faces Pain Scale: Hurts even more Pain Type: Acute pain Pain Location: Head Pain Orientation: Left Pain Descriptors: Headache Pain Frequency: Occasional Pain Onset: Gradual Patients Stated Pain Goal: 2 Pain Intervention(s): Medication (See eMAR) (vicodin 1 po)  Therapy/Group: Individual Therapy  Myra Rude, M.S.,CCC-SLP Pager [phone] 03/12/2012, 2:26 PM

## 2012-03-12 NOTE — Progress Notes (Signed)
Patient ID: Alicia Crawford, female   DOB: 11-04-1955, 56 y.o.   MRN: [id number] Subjective/Complaints: Poor appetite reported. Anxiety perhaps a little better. Slept well last night. Review of Systems  Psychiatric/Behavioral: The patient is nervous/anxious.    Objective: Vital Signs: Blood pressure 110/88, pulse 84, temperature 98.6 F (37 C), temperature source Oral, resp. rate 18, weight 87.3 kg (192 lb 7.4 oz), SpO2 97.00%. No results found. Results for orders placed during the hospital encounter of 03/01/12 (from the past 72 hour(s))  GLUCOSE, CAPILLARY     Status: Abnormal   Collection Time   03/09/12 11:52 AM      Component Value Range Comment   Glucose-Capillary 155 (*) 70 - 99 (mg/dL)    Comment 1 Notify RN     GLUCOSE, CAPILLARY     Status: Abnormal   Collection Time   03/09/12  4:13 PM      Component Value Range Comment   Glucose-Capillary 161 (*) 70 - 99 (mg/dL)   GLUCOSE, CAPILLARY     Status: Abnormal   Collection Time   03/09/12  9:06 PM      Component Value Range Comment   Glucose-Capillary 175 (*) 70 - 99 (mg/dL)   GLUCOSE, CAPILLARY     Status: Abnormal   Collection Time   03/10/12  7:46 AM      Component Value Range Comment   Glucose-Capillary 126 (*) 70 - 99 (mg/dL)    Comment 1 Notify RN     GLUCOSE, CAPILLARY     Status: Abnormal   Collection Time   03/10/12 11:21 AM      Component Value Range Comment   Glucose-Capillary 164 (*) 70 - 99 (mg/dL)    Comment 1 Notify RN     GLUCOSE, CAPILLARY     Status: Abnormal   Collection Time   03/10/12  4:39 PM      Component Value Range Comment   Glucose-Capillary 174 (*) 70 - 99 (mg/dL)   GLUCOSE, CAPILLARY     Status: Abnormal   Collection Time   03/10/12  9:42 PM      Component Value Range Comment   Glucose-Capillary 161 (*) 70 - 99 (mg/dL)    Comment 1 Notify RN     GLUCOSE, CAPILLARY     Status: Abnormal   Collection Time   03/11/12  7:20 AM      Component Value Range Comment   Glucose-Capillary 135 (*) 70 - 99  (mg/dL)    Comment 1 Notify RN     GLUCOSE, CAPILLARY     Status: Abnormal   Collection Time   03/11/12 12:21 PM      Component Value Range Comment   Glucose-Capillary 175 (*) 70 - 99 (mg/dL)    Comment 1 Notify RN     GLUCOSE, CAPILLARY     Status: Abnormal   Collection Time   03/11/12  4:18 PM      Component Value Range Comment   Glucose-Capillary 144 (*) 70 - 99 (mg/dL)   GLUCOSE, CAPILLARY     Status: Abnormal   Collection Time   03/12/12  7:17 AM      Component Value Range Comment   Glucose-Capillary 121 (*) 70 - 99 (mg/dL)    Comment 1 Notify RN        HEENT: normal Cardio: RRR Resp: CTA B/L GI: BS positive Extremity:  No Edema Skin:   Wound C/D/I Neuro: Abnormal Motor 4/5 on R 5/5 on L, Abnormal [facility]  Ataxic/ dec FMC, Reflexes: 3+, Aphasic and Apraxic Musc/Skel:  Other swelling L scalpwound clean and intact- staples out Word finding deficits, sometimes perseverative. Very calm and focused this am. Can read my name from badge    Assessment/Plan: 1. Functional deficits secondary to L SDH, RHP and aphasia which require 3+ hours per day of interdisciplinary therapy in a comprehensive inpatient rehab setting. Physiatrist is providing close team supervision and 24 hour management of active medical problems listed below. Physiatrist and rehab team continue to assess barriers to discharge/monitor patient progress toward functional and medical goals. FIM: FIM - Bathing Bathing Steps Patient Completed: Chest;Right Arm;Right upper leg;Left lower leg (including foot);Left upper leg;Left Arm;Abdomen;Front perineal area;Right lower leg (including foot);Buttocks Bathing: 6: More than reasonable amount of time  FIM - Upper Body Dressing/Undressing Upper body dressing/undressing steps patient completed: Thread/unthread right sleeve of pullover shirt/dresss;Thread/unthread left sleeve of pullover shirt/dress;Put head through opening of pull over shirt/dress;Pull shirt over trunk Upper body  dressing/undressing: 6: More than reasonable amount of time FIM - Lower Body Dressing/Undressing Lower body dressing/undressing steps patient completed: Thread/unthread right underwear leg;Thread/unthread left underwear leg;Pull underwear up/down;Thread/unthread right pants leg;Thread/unthread left pants leg;Pull pants up/down;Don/Doff left shoe;Don/Doff right shoe Lower body dressing/undressing: 6: More than reasonable amount of time  FIM - Toileting Toileting steps completed by patient: Adjust clothing prior to toileting;Performs perineal hygiene;Adjust clothing after toileting Toileting Assistive Devices: Grab bar or rail for support Toileting: 6: More than reasonable amount of time  FIM - Diplomatic Services operational officer Devices: Therapist, music Transfers: 7-Independent: No helper  FIM - Banker Devices: Bed rails Bed/Chair Transfer: 6: Bed > Chair or W/C: No assist  FIM - Locomotion: Wheelchair Locomotion: Wheelchair: 0: Activity did not occur FIM - Locomotion: Ambulation Ambulation/Gait Assistance: 4: Min assist Locomotion: Ambulation: 6: Travels 150 ft or more independently/takes more than reasonable amount of time  Comprehension Comprehension Mode: Auditory Comprehension: 2-Understands basic 25 - 49% of the time/requires cueing 51 - 75% of the time  Expression Expression Mode: Verbal Expression: 1-Expresses basis less than 25% of the time/requires cueing greater than 75% of the time.  Social Interaction Social Interaction: 2-Interacts appropriately 25 - 49% of time - Needs frequent redirection.  Problem Solving Problem Solving Mode: Not assessed Problem Solving: 2-Solves basic 25 - 49% of the time - needs direction more than half the time to initiate, plan or complete simple activities  Memory Memory: 1-Recognizes or recalls less than 25% of the time/requires cueing greater than 75% of the time  Medical Problem List  and Plan:  1. 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 DVT Prophylaxis/Anticoagulation: Support hose. Ambulation daily.  3. Pain Management: Norco as needed . Monitor mental status Constant Headache add topamax 4. seizure prophylaxis. Keppra 500 mg every 12 hours. Monitor for any seizure activity ?duration- would like to wean if pt stys on Topamax 5. 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 Patient independent prior to admission- was doing very well. 6. Hypertension with history of bradycardia. Followup cardiology services as needed. Currently on no antihypertensive medications. Patient on Cozaar 50 mg daily, will increase lopressor to 25mg  bid. Prn clonidine also. 7. Diabetes mellitus. No current diabetic agents. Increased glucophage to 500mg  bid with good results, but she's really not eating much..  - continue Check CBGs a.c. and at bedtime  8. Depression. Zoloft 150 mg daily. Provide emotional support and positive reinforcement   -anxious at times.  9. Neuropsych: anxiety,depression increased with compromised attention and language deficits.  -ritalin added which should help with all of the above.---increase to 10mg    -spacing and pacing therapies should benefit her also.  -continue to provide positive reinforcement 10. FEN- megace trial  -RD eval      LOS (Days) 11 A FACE TO FACE EVALUATION WAS PERFORMED  Lasharn Bufkin E 03/12/2012, 8:23 AM

## 2012-03-12 NOTE — Progress Notes (Signed)
Occupational Therapy Session Note  Patient Details  Name: Alicia Crawford MRN: [id number] Date of Birth: 1956-07-01  Today's Date: 03/12/2012 Time: 4098-1191 Time Calculation (min): 45 min  Short Term Goals: Week 2:  OT Short Term Goal 1 (Week 2): Family education on functional communication, behavior and cognition will be completed OT Short Term Goal 2 (Week 2): Pt will initate performing ADL with <3 prompts OT Short Term Goal 3 (Week 2): Pt will demonstrate basic problem solving in functional tasks with ,min questioning cuing  Skilled Therapeutic Interventions/Progress Updates:    1:1 cognitive reeducation: focus on following directions to clean up food tray and take tray to dirty food cart and went down to kitchen to make mac and cheese (didnt eat a lot of her lunch). Focus session on initiation, sustained attention, sequencing to the 3 step process of making mac and cheese. Pt more talkative this pm but limited by her poor working memory and ability to problem solve and sequencing. Pt required 2 choices to make a decision with mod cuing, to go through each step of the process. Pt needed extra time to make decision and initiate the task. During task pt able to sustain attention for 5 min.  Therapy Documentation Precautions:  Precautions Precautions: Fall Precaution Comments: decreased safety awareness Restrictions Weight Bearing Restrictions: No Pain: Pain Assessment Pain Assessment: No/denies pain Faces Pain Scale: Hurts even more Pain Location: Head Pain Orientation: Left Pain Descriptors: Headache Pain Frequency: Occasional Pain Onset: Gradual Patients Stated Pain Goal: 2 Pain Intervention(s): Medication (See eMAR) (vicodin 1 po)  See FIM for current functional status  Therapy/Group: Individual Therapy  Roney Mans [facility] 03/12/2012, 3:53 PM

## 2012-03-12 NOTE — Progress Notes (Signed)
Occupational Therapy Session Note  Patient Details  Name: Alicia Crawford MRN: [id number] Date of Birth: June 08, 1956  Today's Date: 03/12/2012 Time: 1015-1100 Time Calculation (min): 45 min  Short Term Goals: Week 2:  OT Short Term Goal 1 (Week 2): Family education on functional communication, behavior and cognition will be completed OT Short Term Goal 2 (Week 2): Pt will initate performing ADL with <3 prompts OT Short Term Goal 3 (Week 2): Pt will demonstrate basic problem solving in functional tasks with ,min questioning cuing  Skilled Therapeutic Interventions/Progress Updates:    1:1 pt's participation limited by her headache. Pt whining initially and unable to make a decision between getting showered/ dressed or engaging in a functional game. Pt resistant about getting OOB. Encouraged pt to participate in looking at her [employer] magazine- looked at one article with short 2 sentence statements- able to read them aloud maintaining sustained attention for 3-4 min a time and then tell me one topic word it was about with mod cuing with extra time. Pt reported enjoying this activity at the beginning but then fatigued cognitively after 20 min of this task. Engaged in drinking Ensure for increased PO intake and was able to engage in self feeding for 8 min without cuing.  Therapy Documentation Precautions:  Precautions Precautions: Fall Precaution Comments: decreased safety awareness Restrictions Weight Bearing Restrictions: No    Pain: Pain Assessment Pain Assessment: Faces Pain Score:   6 Faces Pain Scale: Hurts even more Pain Type: Acute pain Pain Location: Head Pain Orientation: Left Pain Descriptors: Headache Pain Frequency: Occasional Pain Onset: Gradual Patients Stated Pain Goal: 3 Pain Intervention(s): Medication (See eMAR) (tylenol 650 mg po)  See FIM for current functional status  Therapy/Group: Individual Therapy  Roney Mans [facility] 03/12/2012, 12:23 PM

## 2012-03-12 NOTE — Progress Notes (Signed)
Physical Therapy Note  Patient Details  Name: Alicia Crawford MRN: [id number] Date of Birth: Apr 22, 1956 Today's Date: 03/12/2012  Time: [phone] 42 minutes  Pt states "my head hurts so bad", RN aware, pt rec'd meds prior to session.  Pt with delayed initiation but able to participate in therapeutic activities with min encouragement today.  No episodes of lability.  Grooming and toileting tasks with min cues for initiation, supervision assist.  Gait in controlled environment with mod I.  Dynamic gait training with speed changes, head turns all with supervision, no LOB.  [place] exercise program performed with demo and handout for falls prevention and LE strengthening.  Pt able to perform program with only 1 seated rest break.  Stair negotiation x 1 flight with supervision.  Standing balance tap ups with B LE with mod cues for reciprocal tapping, min A for balance.   Overall pt with much improved participation today.  Individual therapy   [name] 03/12/2012, 12:22 PM

## 2012-03-12 NOTE — Progress Notes (Signed)
Physical Therapy Note  Patient Details  Name: Alicia Crawford MRN: [id number] Date of Birth: Feb 04, 1956 Today's Date: 03/12/2012  Time: 1345-1420 35 minutes  No c/o pain.  Pt treatment began with focus on attention, initiation on eating task.  Pt required min cuing to initiate, min-mod cues to attend to task in mod distracting environment.  Dishwashing with min cuing for initiation, no cues to attend or problem solve through task.  Pt performs well with familiar tasks.  Pt participated in playing "war" card game with mod cues for attention, pt able to perform with increasing agitation throughout session.  Required max cues for stating which card was higher and for initiating next step of game.  Individual therapy   Roark Rufo 03/12/2012, 3:33 PM

## 2012-03-12 NOTE — Progress Notes (Signed)
Speech Language Pathology Daily Session Note  Patient Details  Name: Alicia Crawford MRN: [id number] Date of Birth: [birth year]  Today's Date: 03/12/2012 Time: 0800-0845 Time Calculation (min): 45 min  Short Term Goals: Week 1:  SLP Short Term Goal 1 (Week 1): Pt will demonstrate sustained attention to task for ~5 mins with Mod A verbal and contextual cues.  SLP Short Term Goal 1 - Progress (Week 1): Progressing toward goal SLP Short Term Goal 2 (Week 1): Pt will demonstrate functional problem solving for functional and familiar tasks with Mod A verbal and semantic cues.  SLP Short Term Goal 2 - Progress (Week 1): Progressing toward goal SLP Short Term Goal 3 (Week 1): Pt will express basic wants/needs during functional and familiar tasks with Mod A multimodal cueing SLP Short Term Goal 3 - Progress (Week 1): Progressing toward goal SLP Short Term Goal 4 (Week 1): Patient will follow basic 2 step commands in functional, familiar tasks with moderate contextual cues SLP Short Term Goal 4 - Progress (Week 1): Progressing toward goal SLP Short Term Goal 5 (Week 1): Pt will utilize swallowing compensatory strategies with Min A verbal and tactile cues.  SLP Short Term Goal 5 - Progress (Week 1): Progressing toward goal  Skilled Therapeutic Interventions: Treatment focused on BADLs of self-feeding and hair grooming.  Patient required moderate verbal encouragement for sustained attention during self-feeding task due to c/o headache throughout meal.  Yet, when sitting in WC at the sink, patient sustained her attention for 8 minutes with no cues to comb out snarls in the back of her hair.  Patient verbalized her basic pain needs independently both with SLP and RN.  Overall, minimal assist level verbal/contextual cues to consume meal and moderate-maximum semanitc and repetition cues for mildly abstract auditory comprehension.   FIM:  Comprehension Comprehension Mode: Auditory Comprehension:  3-Understands basic 50 - 74% of the time/requires cueing 25 - 50%  of the time Expression Expression Mode: Verbal Expression: 2-Expresses basic 25 - 49% of the time/requires cueing 50 - 75% of the time. Uses single words/gestures. Social Interaction Social Interaction: 3-Interacts appropriately 50 - 74% of the time - May be physically or verbally inappropriate. Problem Solving Problem Solving: 2-Solves basic 25 - 49% of the time - needs direction more than half the time to initiate, plan or complete simple activities Memory Memory: 1-Recognizes or recalls less than 25% of the time/requires cueing greater than 75% of the time FIM - Eating Eating Activity: 5: Supervision/cues  Pain Pain Assessment Pain Assessment: No/denies pain Faces Pain Scale: Hurts even more Pain Type: Acute pain Pain Location: Head Pain Orientation: Left Pain Descriptors: Headache Pain Frequency: Occasional Pain Onset: Gradual Patients Stated Pain Goal: 3 Pain Intervention(s): Medication (See eMAR) (vicodin 1 po)  Therapy/Group: Individual Therapy  Myra Rude, M.S.,CCC-SLP Pager [phone] 03/12/2012, 10:18 AM

## 2012-03-12 NOTE — Progress Notes (Signed)
Speech Language Pathology Daily Session Note  Patient Details  Name: Alicia Crawford MRN: [id number] Date of Birth: 08/20/1956  Today's Date: 03/12/2012 Time: 1115-1200 Time Calculation (min): 45 min  Short Term Goals: Week 1:  SLP Short Term Goal 1 (Week 1): Pt will demonstrate sustained attention to task for ~5 mins with Mod A verbal and contextual cues.  SLP Short Term Goal 1 - Progress (Week 1): Progressing toward goal SLP Short Term Goal 2 (Week 1): Pt will demonstrate functional problem solving for functional and familiar tasks with Mod A verbal and semantic cues.  SLP Short Term Goal 2 - Progress (Week 1): Progressing toward goal SLP Short Term Goal 3 (Week 1): Pt will express basic wants/needs during functional and familiar tasks with Mod A multimodal cueing SLP Short Term Goal 3 - Progress (Week 1): Progressing toward goal SLP Short Term Goal 4 (Week 1): Patient will follow basic 2 step commands in functional, familiar tasks with moderate contextual cues SLP Short Term Goal 4 - Progress (Week 1): Progressing toward goal SLP Short Term Goal 5 (Week 1): Pt will utilize swallowing compensatory strategies with Min A verbal and tactile cues.  SLP Short Term Goal 5 - Progress (Week 1): Progressing toward goal  Skilled Therapeutic Interventions: Treatment focused on functional communication via verbally and/or written regarding biographical information with initial total assist level verbal/visual cues as patient c/o a headache with moaning, whining and inability to engage for the first 15 minutes of the session.  However, as HA subsided, patient only required minimal prompts to write her biographical information with noted 2 spelling errors and difficulty recalling her zip code (moderate semantic cue given).  Patient with many attempts to initiate language/thoughts via "my husband...", "I just...", "It's gonna...", and "no, I just wish that..."  Patient did agreed that she is losing her  train of thought (working memory) when she attempts to verbalize them and is frustrated.  Patient did spontaneously ask" Will I get better?"   FIM:  Comprehension Comprehension Mode: Auditory Comprehension: 3-Understands basic 50 - 74% of the time/requires cueing 25 - 50%  of the time Expression Expression Mode: Verbal Expression: 2-Expresses basic 25 - 49% of the time/requires cueing 50 - 75% of the time. Uses single words/gestures. Social Interaction Social Interaction: 3-Interacts appropriately 50 - 74% of the time - May be physically or verbally inappropriate. Problem Solving Problem Solving: 2-Solves basic 25 - 49% of the time - needs direction more than half the time to initiate, plan or complete simple activities Memory Memory: 1-Recognizes or recalls less than 25% of the time/requires cueing greater than 75% of the time FIM - Eating Eating Activity: 5: Supervision/cues  Pain Pain Assessment Pain Assessment: No/denies pain Faces Pain Scale: Hurts even more Pain Type: Acute pain Pain Location: Head Pain Orientation: Left Pain Descriptors: Headache Pain Frequency: Occasional Pain Onset: Gradual Patients Stated Pain Goal: 2 Pain Intervention(s): Medication (See eMAR) (vicodin 1 po)  Therapy/Group: Individual Therapy  Myra Rude, M.S.,CCC-SLP Pager [phone] 03/12/2012, 2:11 PM

## 2012-03-12 NOTE — Progress Notes (Signed)
Social Work Patient ID: Denton Lank, female   DOB: April 10, 1956, 56 y.o.   MRN: [id number]  Spoke with pt's husband today to schedule family education time with him and with pt's daughter.  He plans to be here tomorrow from 9:30 - 12:00 for ed.  He does ask questions about what the options are for him if pt continues to require 24/7 supervision longer than he/ daughter can provide.  States,  "I mean she had a pretty miraculous recovery last time once she got home".  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  Explained to him that length of time for 24/7 supervision is unknown at this time.  Reviewed options of SNF/ALF, private duty agencies and Adult Day Programs.  Plan to discuss further with him tomorrow.  Jaken Fregia

## 2012-03-13 DIAGNOSIS — Z5189 Encounter for other specified aftercare: Secondary | ICD-10-CM

## 2012-03-13 DIAGNOSIS — S065XAA Traumatic subdural hemorrhage with loss of consciousness status unknown, initial encounter: Secondary | ICD-10-CM

## 2012-03-13 DIAGNOSIS — W19XXXA Unspecified fall, initial encounter: Secondary | ICD-10-CM

## 2012-03-13 DIAGNOSIS — S065X9A Traumatic subdural hemorrhage with loss of consciousness of unspecified duration, initial encounter: Secondary | ICD-10-CM

## 2012-03-13 DIAGNOSIS — R4701 Aphasia: Secondary | ICD-10-CM

## 2012-03-13 LAB — GLUCOSE, CAPILLARY
Glucose-Capillary: 142 mg/dL — ABNORMAL HIGH (ref 70–99)
Glucose-Capillary: 233 mg/dL — ABNORMAL HIGH (ref 70–99)

## 2012-03-13 MED ORDER — TOPIRAMATE 25 MG PO TABS
25.0000 mg | ORAL_TABLET | Freq: Two times a day (BID) | ORAL | Status: DC
  Administered 2012-03-13 – 2012-03-16 (×7): 25 mg via ORAL
  Filled 2012-03-13 (×10): qty 1

## 2012-03-13 NOTE — Progress Notes (Signed)
Recreational Therapy Session Note  Patient Details  Name: Toba Claudio MRN: [id number] Date of Birth: 21-Dec-1955 Today's Date: 03/13/2012 Time:  930-10 Pain: c/o HA, premedicated Skilled Therapeutic Interventions/Progress Updates: Ambulated to solarium to raised bed gardens with supervision focusing on verbal expression with phonemic cues.  Discussed potential activities for participation post discharge with pt's husband and daughter. Therapy/Group: Co-Treatment   Shalisa Mcquade 03/13/2012, 4:25 PM

## 2012-03-13 NOTE — Progress Notes (Signed)
Physical Therapy Note  Patient Details  Name: Alicia Crawford MRN: [id number] Date of Birth: 07-28-1956 Today's Date: 03/13/2012  Time: [phone] 43 minutes  No c/o pain.  Treatment focus on pt/family ed with pt's daughter and husband.  Pt performed gait in controlled environment with mod I, outdoor environment supervision.  Pt's family expresses understanding of recommendation for close supervision with gait outdoors and on uneven surfaces.  Pt performed stairs without handrail with supervision.  HEP with cues for exercises and counting.  Pt's family educated on decreased initiation, need for cues to get start with activities, importance of routine, ideas for decreasing distractions and giving pt increased time to express herself verbally.  Daughter and husband express understanding.  Pt's husband and daughter express that they feel comfortable taking pt home at this level of care.  Individual therapy   [name] 03/13/2012, 10:14 AM

## 2012-03-13 NOTE — Progress Notes (Signed)
Speech Language Pathology Daily Session Note  Patient Details  Name: Alicia Crawford MRN: [id number] Date of Birth: 17-Nov-1955  Today's Date: 03/13/2012 Time: 1115-1200 Time Calculation (min): 45 min  Short Term Goals: Week 2:  SLP Short Term Goal 1 (Week 2): Patient will sustain attention for 10-15 minutes in a functional, familiar ADL task with moderate verbal/contextual cues. SLP Short Term Goal 2 (Week 2): Patient will initiate communication of basic wants/needs (via any or all multi-modalities) with moderate verbal/contextual cues. SLP Short Term Goal 3 (Week 2): Patient will solve basic problems within functional ADL tasks with moderate verbal/contextual cues. SLP Short Term Goal 4 (Week 2): Patient will follow basic 2-3 steps within familiar, functional ADL tasks with moderate verbal/contextual cues.  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  Reviewed both impairment areas of cognition and language, giving functional examples of how she requires assist to complete ADLs and the assist in her communication needs.  Reviewed 5 communication strategies for the family to use when interacting in order to allow the paitent time to attempt language and ways to guide her to do so successfully.  Both her husband and daughter asked appropirate questions.  Patient required moderate verbal cues for sustained attention during the session with one spontaneous verbalization of "I'm done," wanting session to end.  Will continue POC until expected D/C of 6/14.   FIM:  Comprehension Comprehension Mode: Auditory Comprehension: 4-Understands basic 75 - 89% of the time/requires cueing 10 - 24% of the time Expression Expression Mode:  Verbal Expression: 2-Expresses basic 25 - 49% of the time/requires cueing 50 - 75% of the time. Uses single words/gestures. Social Interaction Social Interaction: 3-Interacts appropriately 50 - 74% of the time - May be physically or verbally inappropriate. Problem Solving Problem Solving: 3-Solves basic 50 - 74% of the time/requires cueing 25 - 49% of the time Memory Memory: 3-Recognizes or recalls 50 - 74% of the time/requires cueing 25 - 49% of the time FIM - Eating Eating Activity: 6: Swallowing techniques: self-managed  Pain Pain Assessment Pain Assessment: Faces Pain Score:   2 Faces Pain Scale: Hurts little more Pain Type: Acute pain Pain Location: Head Pain Orientation: Left Pain Descriptors: Headache Pain Onset: Gradual Patients Stated Pain Goal: 2 Pain Intervention(s): RN made aware Multiple Pain Sites: No  Therapy/Group: Individual Therapy  Myra Rude, M.S.,CCC-SLP Pager [phone] 03/13/2012, 12:18 PM

## 2012-03-13 NOTE — Progress Notes (Signed)
Speech Language Pathology Daily Session Note  Patient Details  Name: Alicia Crawford MRN: [id number] Date of Birth: March 06, 1956  Today's Date: 03/13/2012 Time: 8756-4332 Time Calculation (min): 45 min  Short Term Goals: Week 2:  SLP Short Term Goal 1 (Week 2): Patient will sustain attention for 10-15 minutes in a functional, familiar ADL task with moderate verbal/contextual cues. SLP Short Term Goal 2 (Week 2): Patient will initiate communication of basic wants/needs (via any or all multi-modalities) with moderate verbal/contextual cues. SLP Short Term Goal 3 (Week 2): Patient will solve basic problems within functional ADL tasks with moderate verbal/contextual cues. SLP Short Term Goal 4 (Week 2): Patient will follow basic 2-3 steps within familiar, functional ADL tasks with moderate verbal/contextual cues.  Skilled Therapeutic Interventions: Treatment focused on sustained attention x15 minutes with supervision level verbal prompts for self-feeding task and no cues needed today in this session for initiation of eating or cutting food.  Patient did require 1 verbal prompt to drink liquids.  Majority of the session focused on functional basic communication of wants/needs as related to her mood and medical condition.  Patient continues to need maximum semantic/sentence completion cues to complete her attempts to express thoughts (I need, I can't, my husband...).  SLP suspects most of these communicative attempts are meaningful however there are moments when that these sentence starters are not necessarily meaningful or purposeful, rather random confusionally based thoughts.  Although, this is difficulty to differentiate.  Increased spontaneous phrasal level expression related to humurous television show x2 with only minimal semantic prompts.   FIM:  Comprehension Comprehension Mode: Auditory Comprehension: 4-Understands basic 75 - 89% of the time/requires cueing 10 - 24% of the  time Expression Expression Mode: Verbal Expression: 2-Expresses basic 25 - 49% of the time/requires cueing 50 - 75% of the time. Uses single words/gestures. Social Interaction Social Interaction: 3-Interacts appropriately 50 - 74% of the time - May be physically or verbally inappropriate. Problem Solving Problem Solving: 3-Solves basic 50 - 74% of the time/requires cueing 25 - 49% of the time Memory Memory: 3-Recognizes or recalls 50 - 74% of the time/requires cueing 25 - 49% of the time FIM - Eating Eating Activity: 6: Swallowing techniques: self-managed  Pain Pain Assessment Pain Assessment: No/denies pain  Therapy/Group: Individual Therapy  Myra Rude, M.S.,CCC-SLP Pager [phone] 03/13/2012, 8:33 AM

## 2012-03-13 NOTE — Progress Notes (Signed)
Patient ID: Alicia Crawford, female   DOB: Aug 25, 1956, 56 y.o.   MRN: [id number] Subjective/Complaints: Poor appetite reported. Anxiety perhaps a little better. Slept well last night. Review of Systems  Psychiatric/Behavioral: The patient is nervous/anxious.    Objective: Vital Signs: Blood pressure 149/85, pulse 63, temperature 97.9 F (36.6 C), temperature source Oral, resp. rate 18, weight 74 kg (163 lb 2.3 oz), SpO2 98.00%. No results found. Results for orders placed during the hospital encounter of 03/01/12 (from the past 72 hour(s))  GLUCOSE, CAPILLARY     Status: Abnormal   Collection Time   03/10/12 11:21 AM      Component Value Range Comment   Glucose-Capillary 164 (*) 70 - 99 (mg/dL)    Comment 1 Notify RN     GLUCOSE, CAPILLARY     Status: Abnormal   Collection Time   03/10/12  4:39 PM      Component Value Range Comment   Glucose-Capillary 174 (*) 70 - 99 (mg/dL)   GLUCOSE, CAPILLARY     Status: Abnormal   Collection Time   03/10/12  9:42 PM      Component Value Range Comment   Glucose-Capillary 161 (*) 70 - 99 (mg/dL)    Comment 1 Notify RN     GLUCOSE, CAPILLARY     Status: Abnormal   Collection Time   03/11/12  7:20 AM      Component Value Range Comment   Glucose-Capillary 135 (*) 70 - 99 (mg/dL)    Comment 1 Notify RN     GLUCOSE, CAPILLARY     Status: Abnormal   Collection Time   03/11/12 12:21 PM      Component Value Range Comment   Glucose-Capillary 175 (*) 70 - 99 (mg/dL)    Comment 1 Notify RN     GLUCOSE, CAPILLARY     Status: Abnormal   Collection Time   03/11/12  4:18 PM      Component Value Range Comment   Glucose-Capillary 144 (*) 70 - 99 (mg/dL)   GLUCOSE, CAPILLARY     Status: Abnormal   Collection Time   03/12/12  7:17 AM      Component Value Range Comment   Glucose-Capillary 121 (*) 70 - 99 (mg/dL)    Comment 1 Notify RN     GLUCOSE, CAPILLARY     Status: Abnormal   Collection Time   03/12/12 12:01 PM      Component Value Range Comment   Glucose-Capillary 180 (*) 70 - 99 (mg/dL)    Comment 1 Notify RN     GLUCOSE, CAPILLARY     Status: Abnormal   Collection Time   03/12/12  4:47 PM      Component Value Range Comment   Glucose-Capillary 184 (*) 70 - 99 (mg/dL)    Comment 1 Documented in Chart      Comment 2 Notify RN     GLUCOSE, CAPILLARY     Status: Abnormal   Collection Time   03/12/12  8:51 PM      Component Value Range Comment   Glucose-Capillary 178 (*) 70 - 99 (mg/dL)    Comment 1 Notify RN     GLUCOSE, CAPILLARY     Status: Abnormal   Collection Time   03/13/12  7:39 AM      Component Value Range Comment   Glucose-Capillary 142 (*) 70 - 99 (mg/dL)    Comment 1 Notify RN        HEENT: normal Cardio:  RRR Resp: CTA B/L GI: BS positive Extremity:  No Edema Skin:   Wound C/D/I Neuro: Abnormal Motor 4/5 on R 5/5 on L, Abnormal FMC Ataxic/ dec FMC, Reflexes: 3+, Aphasic and Apraxic Musc/Skel:  Other swelling L scalpwound clean and intact- staples out Word finding deficits, sometimes perseverative. Very calm and focused this am. Can read my name from badge    Assessment/Plan: 1. Functional deficits secondary to L SDH, RHP and aphasia which require 3+ hours per day of interdisciplinary therapy in a comprehensive inpatient rehab setting. Physiatrist is providing close team supervision and 24 hour management of active medical problems listed below. Physiatrist and rehab team continue to assess barriers to discharge/monitor patient progress toward functional and medical goals. FIM: FIM - Bathing Bathing Steps Patient Completed: Chest;Right Arm;Right upper leg;Left lower leg (including foot);Left upper leg;Left Arm;Abdomen;Front perineal area;Right lower leg (including foot);Buttocks Bathing: 6: More than reasonable amount of time  FIM - Upper Body Dressing/Undressing Upper body dressing/undressing steps patient completed: Thread/unthread right sleeve of pullover shirt/dresss;Thread/unthread left sleeve of  pullover shirt/dress;Put head through opening of pull over shirt/dress;Pull shirt over trunk Upper body dressing/undressing: 6: More than reasonable amount of time FIM - Lower Body Dressing/Undressing Lower body dressing/undressing steps patient completed: Thread/unthread right underwear leg;Thread/unthread left underwear leg;Pull underwear up/down;Thread/unthread right pants leg;Thread/unthread left pants leg;Pull pants up/down;Don/Doff left shoe;Don/Doff right shoe Lower body dressing/undressing: 6: More than reasonable amount of time  FIM - Toileting Toileting steps completed by patient: Adjust clothing prior to toileting;Performs perineal hygiene;Adjust clothing after toileting Toileting Assistive Devices: Grab bar or rail for support Toileting: 6: More than reasonable amount of time  FIM - Diplomatic Services operational officer Devices: Therapist, music Transfers: 7-Independent: No helper  FIM - Banker Devices: Bed rails Bed/Chair Transfer: 7: Independent: No helper  FIM - Locomotion: Wheelchair Locomotion: Wheelchair: 0: Activity did not occur FIM - Locomotion: Ambulation Ambulation/Gait Assistance: 4: Min assist Locomotion: Ambulation: 6: Travels 150 ft or more independently/takes more than reasonable amount of time  Comprehension Comprehension Mode: Auditory Comprehension: 3-Understands basic 50 - 74% of the time/requires cueing 25 - 50%  of the time  Expression Expression Mode: Verbal Expression: 2-Expresses basic 25 - 49% of the time/requires cueing 50 - 75% of the time. Uses single words/gestures.  Social Interaction Social Interaction: 3-Interacts appropriately 50 - 74% of the time - May be physically or verbally inappropriate.  Problem Solving Problem Solving Mode: Not assessed Problem Solving: 2-Solves basic 25 - 49% of the time - needs direction more than half the time to initiate, plan or complete simple  activities  Memory Memory: 2-Recognizes or recalls 25 - 49% of the time/requires cueing 51 - 75% of the time  Medical Problem List and Plan:  1. 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 DVT Prophylaxis/Anticoagulation: Support hose. Ambulation daily.  3. Pain Management: Norco as needed . Monitor mental status Constant Headache add topamax 4. seizure prophylaxis. Keppra 500 mg every 12 hours. Monitor for any seizure activity ?duration- would like to wean if pt stys on Topamax 5. 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 Patient independent prior to admission- was doing very well. 6. Hypertension with history of bradycardia. Followup cardiology services as needed. Currently on no antihypertensive medications. Patient on Cozaar 50 mg daily, will increase lopressor to 25mg  bid. Prn clonidine also. 7. Diabetes mellitus. No current diabetic agents. Increased glucophage to 500mg  bid with good results, but she's  really not eating much..  - continue Check CBGs a.c. and at bedtime  8. Depression. Zoloft 150 mg daily. Provide emotional support and positive reinforcement   -anxious at times. 9. Neuropsych: anxiety,depression increased with compromised attention and language deficits.  -ritalin added which should help with all of the above.---increase to 10mg    -spacing and pacing therapies should benefit her also.  -continue to provide positive reinforcement 10. FEN- megace trial  -RD eval      LOS (Days) 12 A FACE TO FACE EVALUATION WAS PERFORMED  Noble Cicalese E 03/13/2012, 8:02 AM

## 2012-03-13 NOTE — Progress Notes (Signed)
Occupational Therapy Session Note  Patient Details  Name: Alicia Crawford MRN: [id number] Date of Birth: 09-Jun-1956  Today's Date: 03/13/2012 Time: 4098-1191 Time Calculation (min): 45 min  Short Term Goals: Week 2:  OT Short Term Goal 1 (Week 2): Family education on functional communication, behavior and cognition will be completed OT Short Term Goal 2 (Week 2): Pt will initate performing ADL with <3 prompts OT Short Term Goal 3 (Week 2): Pt will demonstrate basic problem solving in functional tasks with ,min questioning cuing  Skilled Therapeutic Interventions/Progress Updates:    1:1 self care retraining. When arrived in room pt was already undressed, new clothes pick-out hanging in the bathroom, and she was sitting on chair in shower. Pt was "stuck" with what to do next, needing cuing to turn on water. Next pt began to use bar soap to wash her hair however was easily redirected to using shampoo. Then was able to complete bathing, drying, dressing, putting dirty clothes away, grooming (brushing and drying hair) standing at the sink with just contextual/ environmental cuing. Pt then became very tired and rested to rest sitting in chair. Gave her socks and shoes as a cue and she completed task. Pt left in recliner. Pt with increased spontaneous language ("This show is crazy" referring to tv show on tv.)  1:1 [phone] Family education with pt's husband and daughter on BI education, [place] recovery process and strategies at home, successful use of routine/ daily schedule, functional performance with ADL, how to provide different types of cuing for success at home, participation in daily chores routines at home with 24 hr supervision/ care  Therapy Documentation Precautions:  Precautions Precautions: Fall Precaution Comments: decreased safety awareness Restrictions Weight Bearing Restrictions: No Pain: Pt reported towards end of session, a headache. RN brought pain meds as scheduled  at beginning of session See FIM for current functional status  Therapy/Group: Individual Therapy  Roney Mans [facility] 03/13/2012, 9:43 AM

## 2012-03-13 NOTE — Progress Notes (Signed)
Physical Therapy Note  Patient Details  Name: Alicia Crawford MRN: [id number] Date of Birth: [birth year] Today's Date: 03/13/2012  Time: 1400-1440 40 minutes  No c/o pain.  Gift shop activity with pt to find items written on a list and cross them off when she found them in gift shop.  Pt able to identify objects with mod questioning cues.  Mod questioning cues to refer to list to search for next item and cross last item off of list.  Pt with good mobility and balance with negotiating small spaces in gift shop.  Continues to verbalize in short, unfinished phrases ex:"This is just so..."  Gait training outside on uneven surfaces, up/down inclines.  Pt fatigued easily and required prolonged seated rest after gait outside.  No LOB outdoors, supervision cues for safety awareness.  Individual therapy   [name] 03/13/2012, 2:41 PM

## 2012-03-14 LAB — GLUCOSE, CAPILLARY: Glucose-Capillary: 151 mg/dL — ABNORMAL HIGH (ref 70–99)

## 2012-03-14 NOTE — Progress Notes (Signed)
Speech Language Pathology Daily Session Note  Patient Details  Name: Alicia Crawford MRN: [id number] Date of Birth: [birth year]  Today's Date: 03/14/2012 Time: 4401-0272 Time Calculation (min): 25 min  Short Term Goals: Week 2: SLP Short Term Goal 1 (Week 2): Patient will sustain attention for 10-15 minutes in a functional, familiar ADL task with moderate verbal/contextual cues. SLP Short Term Goal 2 (Week 2): Patient will initiate communication of basic wants/needs (via any or all multi-modalities) with moderate verbal/contextual cues. SLP Short Term Goal 3 (Week 2): Patient will solve basic problems within functional ADL tasks with moderate verbal/contextual cues. SLP Short Term Goal 4 (Week 2): Patient will follow basic 2-3 steps within familiar, functional ADL tasks with moderate verbal/contextual cues.  Skilled Therapeutic Interventions: Treatment session focused on initiation and problem solving with self feeding task. Patient holding head prior to lunch and required mod assist semantic and phonemic cues to verbally express pain and location.  Patient verbally perseverated on pain and then required mod semantic, visual and tactile cues to attend to and initiaite self feeding of regular textures and thin liquids.  RN present during session and administered medication and patient required mod assist semantic, visual and tactile cues to sequence taking medication.  SLP suspects pain impacts cognition and functional communication.   FIM:  Comprehension Comprehension Mode: Auditory Comprehension: 4-Understands basic 75 - 89% of the time/requires cueing 10 - 24% of the time Expression Expression Mode: Verbal Expression: 3-Expresses basic 50 - 74% of the time/requires cueing 25 - 50% of the time. Needs to repeat parts of sentences. Social Interaction Social Interaction: 3-Interacts appropriately 50 - 74% of the time - May be physically or verbally inappropriate. Problem Solving Problem  Solving: 3-Solves basic 50 - 74% of the time/requires cueing 25 - 49% of the time Memory Memory: 3-Recognizes or recalls 50 - 74% of the time/requires cueing 25 - 49% of the time  Pain Pain Assessment Pain Assessment: 0-10 Pain Score:   8 Faces Pain Scale: Hurts a little bit (nausea) Pain Type: Acute pain Pain Location: Head Pain Orientation: Left Pain Descriptors: Aching Pain Onset: Gradual Patients Stated Pain Goal: 2 Pain Intervention(s): RN made aware Multiple Pain Sites: No  Therapy/Group: Individual Therapy  Charlane Ferretti., CCC-SLP [phone]  [name] 03/14/2012, 12:58 PM

## 2012-03-14 NOTE — Progress Notes (Signed)
Patient ID: Alicia Crawford, female   DOB: 1955/10/13, 56 y.o.   MRN: [id number] Subjective/Complaints: Headache perhaps better.  Difficult to assess due to aphasia Review of Systems  Psychiatric/Behavioral: The patient is nervous/anxious.    Objective: Vital Signs: Blood pressure 133/82, pulse 73, temperature 97.9 F (36.6 C), temperature source Oral, resp. rate 20, weight 74 kg (163 lb 2.3 oz), SpO2 98.00%. No results found. Results for orders placed during the hospital encounter of 03/01/12 (from the past 72 hour(s))  GLUCOSE, CAPILLARY     Status: Abnormal   Collection Time   03/11/12 12:21 PM      Component Value Range Comment   Glucose-Capillary 175 (*) 70 - 99 mg/dL    Comment 1 Notify RN     GLUCOSE, CAPILLARY     Status: Abnormal   Collection Time   03/11/12  4:18 PM      Component Value Range Comment   Glucose-Capillary 144 (*) 70 - 99 mg/dL   GLUCOSE, CAPILLARY     Status: Abnormal   Collection Time   03/12/12  7:17 AM      Component Value Range Comment   Glucose-Capillary 121 (*) 70 - 99 mg/dL    Comment 1 Notify RN     GLUCOSE, CAPILLARY     Status: Abnormal   Collection Time   03/12/12 12:01 PM      Component Value Range Comment   Glucose-Capillary 180 (*) 70 - 99 mg/dL    Comment 1 Notify RN     GLUCOSE, CAPILLARY     Status: Abnormal   Collection Time   03/12/12  4:47 PM      Component Value Range Comment   Glucose-Capillary 184 (*) 70 - 99 mg/dL    Comment 1 Documented in Chart      Comment 2 Notify RN     GLUCOSE, CAPILLARY     Status: Abnormal   Collection Time   03/12/12  8:51 PM      Component Value Range Comment   Glucose-Capillary 178 (*) 70 - 99 mg/dL    Comment 1 Notify RN     GLUCOSE, CAPILLARY     Status: Abnormal   Collection Time   03/13/12  7:39 AM      Component Value Range Comment   Glucose-Capillary 142 (*) 70 - 99 mg/dL    Comment 1 Notify RN     GLUCOSE, CAPILLARY     Status: Abnormal   Collection Time   03/13/12 11:50 AM   Component Value Range Comment   Glucose-Capillary 192 (*) 70 - 99 mg/dL    Comment 1 Notify RN     GLUCOSE, CAPILLARY     Status: Abnormal   Collection Time   03/13/12  4:22 PM      Component Value Range Comment   Glucose-Capillary 233 (*) 70 - 99 mg/dL    Comment 1 Notify RN     GLUCOSE, CAPILLARY     Status: Abnormal   Collection Time   03/13/12  8:40 PM      Component Value Range Comment   Glucose-Capillary 172 (*) 70 - 99 mg/dL    Comment 1 Notify RN     GLUCOSE, CAPILLARY     Status: Abnormal   Collection Time   03/14/12  7:26 AM      Component Value Range Comment   Glucose-Capillary 151 (*) 70 - 99 mg/dL    Comment 1 Notify RN     GLUCOSE, CAPILLARY  Status: Abnormal   Collection Time   03/14/12 11:45 AM      Component Value Range Comment   Glucose-Capillary 218 (*) 70 - 99 mg/dL    Comment 1 Notify RN        HEENT: normal Cardio: RRR Resp: CTA B/L GI: BS positive Extremity:  No Edema Skin:   Wound C/D/I Neuro: Abnormal Motor 4/5 on R 5/5 on L, Abnormal FMC Ataxic/ dec FMC, Reflexes: 3+, Aphasic and Apraxic Musc/Skel:  Other swelling L scalpwound clean and intact- staples out Word finding deficits, sometimes perseverative. Very calm and focused this am. Can read my name from badge  Assessment/Plan: 1. Functional deficits secondary to L SDH, RHP and aphasia which require 3+ hours per day of interdisciplinary therapy in a comprehensive inpatient rehab setting. Physiatrist is providing close team supervision and 24 hour management of active medical problems listed below. Physiatrist and rehab team continue to assess barriers to discharge/monitor patient progress toward functional and medical goals. FIM: FIM - Bathing Bathing Steps Patient Completed: Chest;Right Arm;Right lower leg (including foot);Left Arm;Abdomen;Left lower leg (including foot);Buttocks;Front perineal area;Right upper leg;Left upper leg Bathing: 5: Set-up assist to: Adjust water temp  FIM - Upper  Body Dressing/Undressing Upper body dressing/undressing steps patient completed: Thread/unthread right sleeve of pullover shirt/dresss;Put head through opening of pull over shirt/dress;Thread/unthread left sleeve of pullover shirt/dress;Pull shirt over trunk Upper body dressing/undressing: 6: More than reasonable amount of time FIM - Lower Body Dressing/Undressing Lower body dressing/undressing steps patient completed: Thread/unthread right underwear leg;Thread/unthread left underwear leg;Thread/unthread left pants leg;Thread/unthread right pants leg;Don/Doff right sock;Don/Doff left sock;Fasten/unfasten left shoe;Don/Doff right shoe;Don/Doff left shoe;Fasten/unfasten right shoe;Pull pants up/down;Pull underwear up/down Lower body dressing/undressing: 6: More than reasonable amount of time  FIM - Toileting Toileting steps completed by patient: Adjust clothing prior to toileting;Performs perineal hygiene;Adjust clothing after toileting Toileting Assistive Devices: Grab bar or rail for support Toileting: 6: More than reasonable amount of time  FIM - Diplomatic Services operational officer Devices: Grab bars Toilet Transfers: 6-More than reasonable amt of time  FIM - Banker Devices: Bed rails Bed/Chair Transfer: 6: Chair or W/C > Bed: No assist;6: Bed > Chair or W/C: No assist  FIM - Locomotion: Wheelchair Locomotion: Wheelchair: 0: Activity did not occur FIM - Locomotion: Ambulation Ambulation/Gait Assistance: 4: Min assist Locomotion: Ambulation: 6: Travels 150 ft or more independently/takes more than reasonable amount of time  Comprehension Comprehension Mode: Auditory Comprehension: 4-Understands basic 75 - 89% of the time/requires cueing 10 - 24% of the time  Expression Expression Mode: Verbal Expression: 3-Expresses basic 50 - 74% of the time/requires cueing 25 - 50% of the time. Needs to repeat parts of sentences.  Social  Interaction Social Interaction: 3-Interacts appropriately 50 - 74% of the time - May be physically or verbally inappropriate.  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 DVT Prophylaxis/Anticoagulation: Support hose. Ambulation daily.  3. Pain Management: Norco as needed . Monitor mental status Constant Headache add topamax 4. seizure prophylaxis. Keppra 500 mg every 12 hours. Monitor for any seizure activity ?duration- would like to wean if pt stys on Topamax 5. history of anoxic brain injury after cardiac  arrest November 2011. Patient independent prior to admission- was doing very well. 6. Hypertension with history of bradycardia. Followup cardiology services as needed. Currently on no antihypertensive medications. Patient on Cozaar 50 mg daily, will increase lopressor to 25mg  bid. Prn clonidine also. 7. Diabetes mellitus.. Increased glucophage to 500mg  bid with good results, but she's really not eating much..  - continue Check CBGs a.c. and at bedtime  8. Depression. Zoloft 150 mg daily. Provide emotional support and positive reinforcement   -anxious at times. 9. Neuropsych: anxiety,depression increased with compromised attention and language deficits.  -ritalin added which should help with all of the above.---increase to 10mg    -spacing and pacing therapies should benefit her also.  -continue to provide positive reinforcement 10. FEN- megace trial  -RD eval      LOS (Days) 13 A FACE TO FACE EVALUATION WAS PERFORMED  Juanluis Guastella E 03/14/2012, 11:48 AM

## 2012-03-14 NOTE — Progress Notes (Signed)
Physical Therapy Note  Patient Details  Name: Herta Hink MRN: [id number] Date of Birth: 09-Feb-1956 Today's Date: 03/14/2012  1345-1427 42 minutes  No c/o pain.  Standing ball toss with no LOB.  Pt able to name letters on ball with mod-max cuing.  Pt required max-total A to name a word that started with each letter.  Pt improved ability to identify and vocalize letters with repetition.  Pt taught to play tic tac toe game, able to follow correctly and perform turn taking with supervision cues.  Laundry task with mod cues to initiate each step of task.  Pt requires increased cues when fatigued.  Overall improved activity tolerance today.  Individual therapy   Ahria Slappey 03/14/2012, 4:09 PM

## 2012-03-14 NOTE — Progress Notes (Signed)
Speech Language Pathology Daily Session Note  Patient Details  Name: Alicia Crawford MRN: [id number] Date of Birth: 06-03-1956  Today's Date: 03/14/2012 Time: 0915-1000 Time Calculation (min): 45 min  Short Term Goals: Week 2: SLP Short Term Goal 1 (Week 2): Patient will sustain attention for 10-15 minutes in a functional, familiar ADL task with moderate verbal/contextual cues. SLP Short Term Goal 2 (Week 2): Patient will initiate communication of basic wants/needs (via any or all multi-modalities) with moderate verbal/contextual cues. SLP Short Term Goal 3 (Week 2): Patient will solve basic problems within functional ADL tasks with moderate verbal/contextual cues. SLP Short Term Goal 4 (Week 2): Patient will follow basic 2-3 steps within familiar, functional ADL tasks with moderate verbal/contextual cues.  Skilled Therapeutic Interventions: Patient seen for speech-language therapy to address cognitive-linguistic goals. Cognitive focused on attention and safety awareness with functional tasks; transfer out of bed and ambulation from patient's room to speech therapy room. Patient required supervision and minimal verbal cues to initiate and maintain attention.  Speech-language treatment focused on  word-finding, reducing /transitioning out of perseverations, responding to open-ended questions related to biographical information. Patient became  anxious and frustrated when trying to think of family member names and when perseverating on topics, however was redirected by SLP providing semantic cues, or verbal cues to initiate topic shift. Patient performed better when given extra time to respond, and when topic of conversation was related to her interests, especially about the chickens she has at home. Patient was also noticeably uncomfortable and  per  OT and RN, she had  c/o nausea earlier this morning. Patient avoided obstacles when walking  back to room with SLP supervising; no cues needed for  safety awareness.   FIM:  Comprehension Comprehension Mode: Auditory Comprehension: 4-Understands basic 75 - 89% of the time/requires cueing 10 - 24% of the time Expression Expression Mode: Verbal Expression: 3-Expresses basic 50 - 74% of the time/requires cueing 25 - 50% of the time. Needs to repeat parts of sentences. Social Interaction Social Interaction: 3-Interacts appropriately 50 - 74% of the time - May be physically or verbally inappropriate. Problem Solving Problem Solving: 3-Solves basic 50 - 74% of the time/requires cueing 25 - 49% of the time Memory Memory: 3-Recognizes or recalls 50 - 74% of the time/requires cueing 25 - 49% of the time  Pain Pain Assessment Pain Assessment: PAINAD Faces Pain Scale: Hurts a little bit (nausea) Pain Type: Acute pain Pain Location:  (nausea) Pain Orientation: Other (Comment) (generalized nausea) Pain Onset: On-going Patients Stated Pain Goal: 2 Pain Intervention(s): RN made aware  Therapy/Group: Individual Therapy  Pablo Lawrence 03/14/2012, 11:30 AM  Angela Nevin, MA, CCC-SLP Speech-Language Pathologist [facility]

## 2012-03-14 NOTE — Discharge Instructions (Signed)
Inpatient Rehab Discharge Instructions  Alicia Crawford Discharge date and time: No discharge date for patient encounter.   Activities/Precautions/ Functional Status: Activity: activity as tolerated Diet: regular diet Wound Care: none needed Functional status:  ___ No restrictions     ___ Walk up steps independently _x__ 24/7 supervision/assistance   _x__ Walk up steps with assistance ___ Intermittent supervision/assistance  ___ Bathe/dress independently ___ Walk with walker     ___ Bathe/dress with assistance ___ Walk Independently    ___ Shower independently __x_ Walk with assistance    ___ Shower with assistance ___ No alcohol     ___ Return to work/school ________   COMMUNITY REFERRALS UPON DISCHARGE:    Outpatient:  OT    ST               Agency: [facility] Phone: [phone]                Appointment Date/Time: 6/19 @ 10:30 (arrive at 10:00) and 6/20 @ 9:00 am   GENERAL COMMUNITY RESOURCES FOR PATIENT/FAMILY:  Support Groups: [facility] (info attached) Caregiver Support: same    Special Instructions:    My questions have been answered and I understand these instructions. I will adhere to these goals and the provided educational materials after my discharge from the hospital.  Patient/Caregiver Signature _______________________________ Date __________  Clinician Signature _______________________________________ Date __________  Please bring this form and your medication list with you to all your follow-up doctor's appointments.

## 2012-03-14 NOTE — Progress Notes (Signed)
Occupational Therapy Session Note  Patient Details  Name: Alicia Crawford MRN: [id number] Date of Birth: 11-Jan-1956  Today's Date: 03/14/2012 Time: 5621-3086 Time Calculation (min): 45 min  Short Term Goals: Week 2:  OT Short Term Goal 1 (Week 2): Family education on functional communication, behavior and cognition will be completed OT Short Term Goal 2 (Week 2): Pt will initate performing ADL with <3 prompts OT Short Term Goal 3 (Week 2): Pt will demonstrate basic problem solving in functional tasks with ,min questioning cuing  Skilled Therapeutic Interventions/Progress Updates:    1:1 self care retraining this pm. With encouragement, pt chose new clothes to don and decided to take a shower. Pt with increased verbalization about family, outing, and asked questions regarding process of aDL routine. Pt needed mod cuing to get organized initially (turning water on, when to initiate starting to wash hair- pt would have continue to wet hair until someone cued her.) Then needed cue to transition to washing body - able to complete this task without cuing. Donning clothing and demonstrated sustained attention for 8-9 min without cuing to complete dressing and combing hair before fatigue. Pt gathered clothes and was ready to walk to laundry room when PT met her.     Therapy Documentation Precautions:  Precautions Precautions: Fall Precaution Comments: decreased safety awareness Restrictions Weight Bearing Restrictions: No Pain: No c/o pain  See FIM for current functional status  Therapy/Group: Individual Therapy  Roney Mans [facility] 03/14/2012, 4:09 PM

## 2012-03-14 NOTE — Progress Notes (Signed)
Occupational Therapy Session Note  Patient Details  Name: Alicia Crawford MRN: [id number] Date of Birth: 07/30/1956  Today's Date: 03/14/2012 Time:  -     Short Term Goals: Week 2:  OT Short Term Goal 1 (Week 2): Family education on functional communication, behavior and cognition will be completed OT Short Term Goal 2 (Week 2): Pt will initate performing ADL with <3 prompts OT Short Term Goal 3 (Week 2): Pt will demonstrate basic problem solving in functional tasks with ,min questioning cuing  Skilled Therapeutic Interventions/Progress Updates:    Pt able to communicate her feelings of not feeling well and wanting to rest in bed. GAve ginger ale and encourage her to sit up in the bed.  Therapy Documentation Precautions:  Precautions Precautions: Fall Precaution Comments: decreased safety awareness Restrictions Weight Bearing Restrictions: No General: General Amount of Missed OT Time (min): 45 Minutes Vital Signs:   Pain: Pt c/o pf not feeling well. Her stomach really hurts. RN notified and gave something.  See FIM for current functional status  Therapy/Group: Individual Therapy  Roney Mans [facility] 03/14/2012, 9:11 AM

## 2012-03-14 NOTE — Progress Notes (Signed)
Physical Therapy Note  Patient Details  Name: Alicia Crawford MRN: [id number] Date of Birth: 11-10-1955 Today's Date: 03/14/2012  Time: 1000-1040 40 minutes  Pt c/o headache, RN made aware.  Car transfers to simulated truck and SUV height with supervision, pt with good balance with lifting 1 LE in car while holding hand grip which pt states her car has.  HEP review for [facility] exercise program.  Focus on pt following written handout and counting reps.  Pt required mod cuing to attend to count and benefits from counting out loud during exercise.  Pt required mod cues to attend as she would frequently count higher than the 10 reps she was instructed to perform.  Pt continues to require mod-max cuing for initiating tasks.  Gait on unit with mod I, no LOB.  Individual therapy   Shenelle Klas 03/14/2012, 12:30 PM

## 2012-03-14 NOTE — Progress Notes (Signed)
Social Work Patient ID: Alicia Crawford, female   DOB: 02/05/1956, 56 y.o.   MRN: [id number]  Met with pt's husband yesterday after completion of family education.  Husband reports he feels comfortable with pt's current functional levels and family ability to provide 24/7 assistance.  Notes his only concern is that "the billing doesn't get messed up with outpatient again."    Plan for pt to go to OP for f/u therapy and no DME needs.  Providing pt and family with info on [employer], Engineer, site agencies and Adult Day Programs.    Alicia Crawford

## 2012-03-15 LAB — GLUCOSE, CAPILLARY
Glucose-Capillary: 212 mg/dL — ABNORMAL HIGH (ref 70–99)
Glucose-Capillary: 241 mg/dL — ABNORMAL HIGH (ref 70–99)

## 2012-03-15 MED ORDER — METFORMIN HCL 500 MG PO TABS
500.0000 mg | ORAL_TABLET | Freq: Two times a day (BID) | ORAL | Status: DC
  Administered 2012-03-15 – 2012-03-16 (×3): 500 mg via ORAL
  Filled 2012-03-15 (×5): qty 1

## 2012-03-15 NOTE — Progress Notes (Signed)
Recreational Therapy Session Note  Patient Details  Name: Alicia Crawford MRN: [id number] Date of Birth: 1956-07-17 Today's Date: 03/15/2012 Time:  07-1214 Pain: c/o HA, premedicated prior to outing and then RN informed once returned to hospital Skilled Therapeutic Interventions/Progress Updates: pt participated in community reintegration/outing to [facility] ambulatory level.  Pt negotiated obstacles and ambulated on multiple outdoor surface types with supervision.  Pt required mod- max verbal cues for initiation, functional communication, and cognition during the outing.  Pt stated that she enjoyed the outing multiple times throughout the outing.  Therapy/Group: [employer]  Alicia Crawford 03/15/2012, 1:53 PM

## 2012-03-15 NOTE — Progress Notes (Signed)
Occupational Therapy Discharge Summary  Patient Details  Name: Alicia Crawford MRN: [id number] Date of Birth: 1955/12/08  Today's Date: 03/15/2012 Time: 1000-1100 Time Calculation (min): 60 min  Patient has met 11 of 11 long term goals due to improved activity tolerance, improved balance, postural control, improved attention and improved coordination.  Patient to discharge at overall Supervision level for ADLs- for cuing.  Patient's care partner is independent to provide the necessary physical and cognitive assistance at discharge.  Pt requires max A for basic cognition (attention, awareness, problem solving, initaition) and functional communication. Education completed with pt's daughter and husband; they will pass on educaton pieces to pt's sister who will be providing 24 hour care.  Reasons goals not met: n/a  Recommendation:  Patient will benefit from ongoing skilled OT services in outpatient setting to continue to advance functional skills in the area of BADL and iADL.  Equipment: No equipment provided  Reasons for discharge: treatment goals met and discharge from hospital  Patient/family agrees with progress made and goals achieved: Yes  OT Discharge Pain Pain Assessment Pain Assessment: 0-10 Pain Score:   5 Pain Type: Acute pain Pain Location: Head Pain Orientation: Left Pain Descriptors: Headache Pain Frequency: Constant Pain Onset: On-going Patients Stated Pain Goal: 2 Pain Intervention(s): Medication (See eMAR) (tylenol 650 mgpo) ADL  see FIM Vision/Perception  Vision - History Baseline Vision: No visual deficits Patient Visual Report: No change from baseline Vision - Assessment Eye Alignment: Within Functional Limits Perception Perception: Within Functional Limits Praxis Praxis: Intact  Cognition Overall Cognitive Status: Impaired Arousal/Alertness: Awake/alert Orientation Level: Oriented to person;Oriented to place;Oriented to time;Oriented to  situation Attention: Sustained Sustained Attention: Impaired Sustained Attention Impairment: Verbal basic;Functional basic Selective Attention: Impaired Selective Attention Impairment: Verbal basic;Functional basic Memory: Impaired Memory Impairment: Storage deficit;Retrieval deficit;Decreased recall of new information;Decreased short term memory Decreased Short Term Memory: Verbal basic;Functional basic Awareness: Impaired Awareness Impairment: Intellectual impairment Problem Solving: Impaired Problem Solving Impairment: Verbal basic;Functional basic Executive Function: Reasoning;Sequencing;Organizing;Decision Making;Initiating;Self Monitoring;Self Correcting Reasoning: Impaired Reasoning Impairment: Verbal basic;Functional basic Sequencing: Impaired Sequencing Impairment: Verbal basic;Functional basic Organizing: Impaired Organizing Impairment: Verbal basic;Functional basic Decision Making: Impaired Decision Making Impairment: Verbal basic;Functional basic Initiating: Impaired Initiating Impairment: Verbal basic;Functional basic Self Monitoring: Impaired Self Monitoring Impairment: Verbal basic;Functional basic Self Correcting: Impaired Self Correcting Impairment: Verbal basic;Functional basic Safety/Judgment: Impaired Comments: poor initiation [place] [id number] Scales of Cognitive Functioning: Confused/inappropriate/non-agitated Sensation Sensation Light Touch: Appears Intact Stereognosis: Appears Intact Hot/Cold: Appears Intact Proprioception: Appears Intact Coordination Gross Motor Movements are Fluid and Coordinated: Yes Fine Motor Movements are Fluid and Coordinated: Yes Motor  Motor Motor: Within Functional Limits Mobility  Bed Mobility Supine to Sit: 7: Independent Sitting - Scoot to Edge of Bed: 7: Independent Sit to Supine: 7: Independent Transfers Sit to Stand: 7: Independent Stand to Sit: 7: Independent  Trunk/Postural Assessment  Cervical  Assessment Cervical Assessment: Within Functional Limits Thoracic Assessment Thoracic Assessment: Within Functional Limits Lumbar Assessment Lumbar Assessment: Within Functional Limits Postural Control Postural Control: Within Functional Limits  Balance Balance Balance Assessed: Yes Static Standing Balance Static Standing - Level of Assistance: 7: Independent Dynamic Standing Balance Dynamic Standing - Level of Assistance: 6: Modified independent (Device/Increase time) Extremity/Trunk Assessment RUE Assessment RUE Assessment: Within Functional Limits LUE Assessment LUE Assessment: Within Functional Limits  See FIM for current functional status  Roney Mans [facility] 03/15/2012, 2:53 PM

## 2012-03-15 NOTE — Progress Notes (Signed)
Occupational Therapy Session Note  Patient Details  Name: Alicia Crawford MRN: [id number] Date of Birth: [birth year]  Today's Date: 03/15/2012 Time: 1000-1100 Time Calculation (min): 60 min  Short Term Goals: Week 2:  OT Short Term Goal 1 (Week 2): Family education on functional communication, behavior and cognition will be completed OT Short Term Goal 2 (Week 2): Pt will initate performing ADL with <3 prompts OT Short Term Goal 3 (Week 2): Pt will demonstrate basic problem solving in functional tasks with ,min questioning cuing  Skilled Therapeutic Interventions/Progress Updates:    Community Reintegration:  Outing with Rec Therapy and Speech Therapy to [employer]. Focused on initiation, functional communication, functional path navigating, attention (sustained to selective). Overall pt demonstrated mod to max A for cognition. Pt enjoyed this experience. See outing goal sheet in shadow chart for further details.  Therapy Documentation Precautions:  Precautions Precautions: Fall Precaution Comments: decreased safety awareness Restrictions Weight Bearing Restrictions: No    Pain: Pain Assessment Pain Assessment: 0-10 Pain Score:   5 Pain Type: Acute pain Pain Location: Head Pain Orientation: Left Pain Descriptors: Headache Pain Frequency: Constant Pain Onset: On-going Patients Stated Pain Goal: 2 Pain Intervention(s): Medication (See eMAR) (tylenol 650 mgpo)  See FIM for current functional status  Therapy/Group: Co-Treatment  Adan Sis 03/15/2012, 2:29 PM

## 2012-03-15 NOTE — Progress Notes (Addendum)
Patient ID: Alicia Crawford, female   DOB: August 14, 1956, 56 y.o.   MRN: [id number] Patient ID: Alicia Crawford, female   DOB: 07/30/1956, 56 y.o.   MRN: [id number] Subjective/Complaints: No complaints. Review of Systems  Psychiatric/Behavioral: The patient is nervous/anxious.    Objective: Vital Signs: Blood pressure 133/82, pulse 82, temperature 98.1 F (36.7 C), temperature source Oral, resp. rate 18, weight 88 kg (194 lb 0.1 oz), SpO2 98.00%. No results found. Results for orders placed during the hospital encounter of 03/01/12 (from the past 72 hour(s))  GLUCOSE, CAPILLARY     Status: Abnormal   Collection Time   03/12/12  7:17 AM      Component Value Range Comment   Glucose-Capillary 121 (*) 70 - 99 mg/dL    Comment 1 Notify RN     GLUCOSE, CAPILLARY     Status: Abnormal   Collection Time   03/12/12 12:01 PM      Component Value Range Comment   Glucose-Capillary 180 (*) 70 - 99 mg/dL    Comment 1 Notify RN     GLUCOSE, CAPILLARY     Status: Abnormal   Collection Time   03/12/12  4:47 PM      Component Value Range Comment   Glucose-Capillary 184 (*) 70 - 99 mg/dL    Comment 1 Documented in Chart      Comment 2 Notify RN     GLUCOSE, CAPILLARY     Status: Abnormal   Collection Time   03/12/12  8:51 PM      Component Value Range Comment   Glucose-Capillary 178 (*) 70 - 99 mg/dL    Comment 1 Notify RN     GLUCOSE, CAPILLARY     Status: Abnormal   Collection Time   03/13/12  7:39 AM      Component Value Range Comment   Glucose-Capillary 142 (*) 70 - 99 mg/dL    Comment 1 Notify RN     GLUCOSE, CAPILLARY     Status: Abnormal   Collection Time   03/13/12 11:50 AM      Component Value Range Comment   Glucose-Capillary 192 (*) 70 - 99 mg/dL    Comment 1 Notify RN     GLUCOSE, CAPILLARY     Status: Abnormal   Collection Time   03/13/12  4:22 PM      Component Value Range Comment   Glucose-Capillary 233 (*) 70 - 99 mg/dL    Comment 1 Notify RN     GLUCOSE, CAPILLARY      Status: Abnormal   Collection Time   03/13/12  8:40 PM      Component Value Range Comment   Glucose-Capillary 172 (*) 70 - 99 mg/dL    Comment 1 Notify RN     GLUCOSE, CAPILLARY     Status: Abnormal   Collection Time   03/14/12  7:26 AM      Component Value Range Comment   Glucose-Capillary 151 (*) 70 - 99 mg/dL    Comment 1 Notify RN     GLUCOSE, CAPILLARY     Status: Abnormal   Collection Time   03/14/12 11:45 AM      Component Value Range Comment   Glucose-Capillary 218 (*) 70 - 99 mg/dL    Comment 1 Notify RN     GLUCOSE, CAPILLARY     Status: Abnormal   Collection Time   03/14/12  4:15 PM      Component Value Range Comment  Glucose-Capillary 197 (*) 70 - 99 mg/dL   GLUCOSE, CAPILLARY     Status: Abnormal   Collection Time   03/14/12  9:22 PM      Component Value Range Comment   Glucose-Capillary 180 (*) 70 - 99 mg/dL      HEENT: normal Cardio: RRR Resp: CTA B/L GI: BS positive Extremity:  No Edema Skin:   Wound C/D/I Neuro: Abnormal Motor 4/5 on R 5/5 on L, Abnormal FMC Ataxic/ dec FMC, Reflexes: 3+, Aphasic and Apraxic Musc/Skel:  Other swelling L scalpwound clean and intact- staples out Word finding deficits, sometimes perseverative. Able to repeat. Could not tell me today's date when i said tomorrow is 6/14  Assessment/Plan: 1. Functional deficits secondary to L SDH, RHP and aphasia which require 3+ hours per day of interdisciplinary therapy in a comprehensive inpatient rehab setting. Physiatrist is providing close team supervision and 24 hour management of active medical problems listed below. Physiatrist and rehab team continue to assess barriers to discharge/monitor patient progress toward functional and medical goals. FIM: FIM - Bathing Bathing Steps Patient Completed: Chest;Right Arm;Right lower leg (including foot);Left Arm;Abdomen;Left lower leg (including foot);Buttocks;Front perineal area;Right upper leg;Left upper leg Bathing: 5: Set-up assist to: Adjust  water temp  FIM - Upper Body Dressing/Undressing Upper body dressing/undressing steps patient completed: Thread/unthread right sleeve of pullover shirt/dresss;Put head through opening of pull over shirt/dress;Thread/unthread left sleeve of pullover shirt/dress;Pull shirt over trunk Upper body dressing/undressing: 6: More than reasonable amount of time FIM - Lower Body Dressing/Undressing Lower body dressing/undressing steps patient completed: Thread/unthread right underwear leg;Thread/unthread left underwear leg;Thread/unthread left pants leg;Thread/unthread right pants leg;Don/Doff right sock;Don/Doff left sock;Fasten/unfasten left shoe;Don/Doff right shoe;Don/Doff left shoe;Fasten/unfasten right shoe;Pull pants up/down;Pull underwear up/down Lower body dressing/undressing: 6: More than reasonable amount of time  FIM - Toileting Toileting steps completed by patient: Adjust clothing prior to toileting;Performs perineal hygiene;Adjust clothing after toileting Toileting Assistive Devices: Grab bar or rail for support Toileting: 6: More than reasonable amount of time  FIM - Diplomatic Services operational officer Devices: Grab bars Toilet Transfers: 6-More than reasonable amt of time  FIM - Banker Devices: Bed rails Bed/Chair Transfer: 6: Bed > Chair or W/C: No assist;6: Chair or W/C > Bed: No assist  FIM - Locomotion: Wheelchair Locomotion: Wheelchair: 0: Activity did not occur FIM - Locomotion: Ambulation Ambulation/Gait Assistance: 4: Min assist Locomotion: Ambulation: 6: Travels 150 ft or more independently/takes more than reasonable amount of time  Comprehension Comprehension Mode: Auditory Comprehension: 4-Understands basic 75 - 89% of the time/requires cueing 10 - 24% of the time  Expression Expression Mode: Verbal Expression: 3-Expresses basic 50 - 74% of the time/requires cueing 25 - 50% of the time. Needs to repeat parts of  sentences.  Social Interaction Social Interaction: 3-Interacts appropriately 50 - 74% of the time - May be physically or verbally inappropriate.  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 DVT Prophylaxis/Anticoagulation: Support hose. Ambulation daily.  3. Pain Management: Norco as needed . Monitor mental status Constant Headache add topamax 4. seizure prophylaxis. Keppra 500 mg every 12 hours. Monitor for any seizure activity ?duration- would like to wean if pt stys  on Topamax 5. 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 Patient independent prior to admission- was doing very well. 6. Hypertension with history of bradycardia. Followup cardiology services as needed. Currently on no antihypertensive medications. Patient on Cozaar 50 mg daily,lopressor 25mg  bid. Prn clonidine also. 7. Diabetes mellitus.Marland Kitchenglucophage-increase back to 500mg  bid  - continue Check CBGs a.c. and at bedtime  8. Depression. Zoloft 150 mg daily. Provide emotional support and positive reinforcement   -anxious at times. 9. Neuropsych: anxiety,depression increased with compromised attention and language deficits.  -ritalin 10mg   -spacing and pacing therapies should benefit her also.  -continue to provide positive reinforcement 10. FEN- megace trial  -regular diet  -intaket better      LOS (Days) 14 A FACE TO FACE EVALUATION WAS PERFORMED  Eric Morganti T 03/15/2012, 7:04 AM

## 2012-03-15 NOTE — Progress Notes (Signed)
Occupational Therapy Session Note  Patient Details  Name: Alicia Crawford MRN: [id number] Date of Birth: 1956-05-20  Today's Date: 03/15/2012 Time: 4098-1191 Time Calculation (min): 30 min  Short Term Goals: Week 2:  OT Short Term Goal 1 (Week 2): Family education on functional communication, behavior and cognition will be completed OT Short Term Goal 2 (Week 2): Pt will initate performing ADL with <3 prompts OT Short Term Goal 3 (Week 2): Pt will demonstrate basic problem solving in functional tasks with ,min questioning cuing  Skilled Therapeutic Interventions/Progress Updates:    1:1 GRAD DAY: self care retraining: focus on getting ready for outing today with rec and speech therapy to [facility]. Discussed what we will see at Center with maxA when questioned with choices. Dressing including picking out appropriate clothing for hot weather outside, grooming standing at sink, cleaning up room. Pt needed min cuing with these procedural tasks.   Therapy Documentation Precautions:  Precautions Precautions: Fall Precaution Comments: decreased safety awareness Restrictions Weight Bearing Restrictions: No Pain: No c/o pain See FIM for current functional status  Therapy/Group: Individual Therapy  Roney Mans [facility] 03/15/2012, 2:25 PM

## 2012-03-15 NOTE — Progress Notes (Signed)
Recreational Therapy Discharge Summary Patient Details  Name: Saydi Kobel MRN: [id number] Date of Birth: 10/29/1955 Today's Date: 03/15/2012  Long term goals set: 2  Long term goals met:  Comments on progress toward goals: 2  Reasons goals not met: Pt has made great progress toward goals and is ready for discharge home with family to provide 24 hour supervision.  Pt continues to be limited by decreased activity tolerance, decreased cognition, and functional communication.  Reasons for discharge: discharge from hospital  Patient/family agrees with progress made and goals achieved: Yes  Nunzio Banet 03/15/2012, 4:05 PM

## 2012-03-15 NOTE — Patient Care Conference (Signed)
Inpatient RehabilitationTeam Conference Note Date: 03/13/2012   Time: 1:50 PM    Patient Name: Alicia Crawford      Medical Record Number: [id number]  Date of Birth: 25-Aug-1956 Sex: Female         Room/Bed: 4025/4025-01 Payor Info: Payor: [facility]  Plan: [facility]  Product Type: *No Product type*     Admitting Diagnosis: TBI  Admit Date/Time:  03/01/2012  6:42 PM Admission Comments: No comment available   Primary Diagnosis:  SDH (subdural hematoma) Principal Problem: SDH (subdural hematoma)  Patient Active Problem List   Diagnosis Date Noted  . SDH (subdural hematoma) 03/02/2012  . Subdural hematoma 02/27/2012  . S/P craniotomy, 02/25/12 after fall, SDH 02/27/2012  . Bradycardia, she has had baseline bradycardia prior to admission 02/27/2012  . Altered mental status 02/27/2012  . Obesity 02/27/2012  . Diabetes mellitus 02/27/2012  . CAD, cardiac arrest, CPR, CABG in [facility] Dec 2011-NL LVF by Echo, neg Nuc April 2013 02/27/2012  . Anxiety, ? residual hypoxic brain inhury from cardiac arrest 2011 02/27/2012  . Peripheral artery disease, bilta LE PVD by angio April 2013 01/18/2012  . HLD (hyperlipidemia) 01/18/2012  . HTN (hypertension) 01/18/2012  . Renal artery stenosis, Right, 75% 01/18/2012    Expected Discharge Date: Expected Discharge Date: 03/16/12  Team Members Present: Physician: Dr. Claudette Laws Case Manager Present: Melanee Spry, RN Social Worker Present: Amada Jupiter, LCSW Nurse Present: Carlean Purl, RN PT Present: Reggy Eye, PT;Becky Henrene Dodge, PT;Other (comment) Sherrine Maples) OT Present: Roney Mans, OT;Ardis Rowan, COTA SLP Present: Myra Rude, SLP Other (Discipline and Name): Tora Duck, PPS Coordinator     Current Status/Progress Goal Weekly Team Focus  Medical   headache  reduce left-sided headache pain  medication adjustment   Bowel/Bladder   continent bowel and bladder mod independent in room lbm 6-8  mod independent   monitor  for bm every other day or every 3 days    Swallow/Nutrition/ Hydration             ADL's   supervision  supervision  activity tolerance, attention, going on an outting   Mobility   supervision-mod I  mod I  activity tolerance, initiation , attention   Communication   Maximum assist  Minimal assist  sustained attention, word finding, intiation, family/patient education   Safety/Cognition/ Behavioral Observations  Maximum assist  Minimal assist  attention, problem solving, initiation   Pain   vicodin 1 po every 4hours tylenol 650 mg po prn for breakthrough pain   less than 2   monitor effectiveness of pain meds    Skin   left crani incision healing   no new breakdown   educate family on skin care      *See Interdisciplinary Assessment and Plan and progress notes for long and short-term goals  Barriers to Discharge: reduced safety reduced awareness of deficits    Possible Resolutions to Barriers:  complete family education    Discharge Planning/Teaching Needs:  home with husband and daughter to provide 24/7 supervision      Team Discussion: Therapy outing on Thursday-completing family ed-ready for d/c Friday   Revisions to Treatment Plan: none    Continued Need for Acute Rehabilitation Level of Care: The patient requires daily medical management by a physician with specialized training in physical medicine and rehabilitation for the following conditions: Daily direction of a multidisciplinary physical rehabilitation program to ensure safe treatment while eliciting the highest outcome that is of practical value to the patient.: Yes  Daily medical management of patient stability for increased activity during participation in an intensive rehabilitation regime.: Yes Daily analysis of laboratory values and/or radiology reports with any subsequent need for medication adjustment of medical intervention for : Neurological problems;Post surgical problems  Brock Ra 03/15/2012,  10:09 AM

## 2012-03-15 NOTE — Care Management Note (Signed)
Patient ID: Alicia Crawford, female   DOB: Sep 06, 1956, 56 y.o.   MRN: [id number] Update called to Tilford Pillar at [facility].

## 2012-03-15 NOTE — Progress Notes (Signed)
Speech Language Pathology Daily Session Note  Patient Details  Name: Alicia Crawford MRN: [id number] Date of Birth: 04/03/1956  Today's Date: 03/15/2012 Time: 1100-1200 Time Calculation (min): 60 min  Short Term Goals: Week 2: SLP Short Term Goal 1 (Week 2): Patient will sustain attention for 10-15 minutes in a functional, familiar ADL task with moderate verbal/contextual cues. SLP Short Term Goal 2 (Week 2): Patient will initiate communication of basic wants/needs (via any or all multi-modalities) with moderate verbal/contextual cues. SLP Short Term Goal 3 (Week 2): Patient will solve basic problems within functional ADL tasks with moderate verbal/contextual cues. SLP Short Term Goal 4 (Week 2): Patient will follow basic 2-3 steps within familiar, functional ADL tasks with moderate verbal/contextual cues.  Skilled Therapeutic Interventions: Treatment focused on expressive communication and problem solving goals in the community during outing. Patient did not exhibit any unsafe behaviors and verbally or non-verbally indicated basic needs and feelings (anxiety, fatigue) etc. when questioned periodically by therapist. Patient used photos paired with names to identify and describe animals seen at science center during outing, with therapist directing use and providing  2-choice cues when necessary. Verbal initiation of responses and comments increased as compared to previous session, and utterance length increased from appoximately 1-3 word phrases to 4-5 word phrases. SLP facilitated sentence level and conversational level expression with verbal and visual cues to decrease incidence of perseverations and topic shifting.    FIM:  Comprehension Comprehension Mode: Auditory Comprehension: 4-Understands basic 75 - 89% of the time/requires cueing 10 - 24% of the time Expression Expression Mode: Verbal Expression: 2-Expresses basic 25 - 49% of the time/requires cueing 50 - 75% of the time. Uses  single words/gestures. Social Interaction Social Interaction: 4-Interacts appropriately 75 - 89% of the time - Needs redirection for appropriate language or to initiate interaction. Problem Solving Problem Solving: 5-Solves basic 90% of the time/requires cueing < 10% of the time Memory Memory: 3-Recognizes or recalls 50 - 74% of the time/requires cueing 25 - 49% of the time  Pain Pain Assessment Pain Assessment: 0-10 Pain Score:   4 Faces Pain Scale: Hurts little more Pain Type: Acute pain Pain Location: Head Pain Orientation: Left Pain Descriptors: Headache Pain Frequency: Constant Pain Onset: On-going Patients Stated Pain Goal: 2 Pain Intervention(s): Medication (See eMAR) (tylenol 650 mgpo)  Therapy/Group: Individual Therapy  Pablo Lawrence 03/15/2012, 3:28 PM  Angela Nevin, MA, CCC-SLP Speech-Language Pathologist [facility] Inpatient Rehabilitation

## 2012-03-15 NOTE — Progress Notes (Signed)
Physical Therapy Note  Patient Details  Name: Alicia Crawford MRN: [id number] Date of Birth: Mar 12, 1956 Today's Date: 03/15/2012  Time: 1515-1533 18 minutes  No c/o pain.  Treatment session for pt/family ed with pt's sister who will be staying with her when she goes home.  Pt's sister was educated on recommendations for HEP, schedule/routine for pt, pt's abilities and ideas of activities she and pt could perform together.  Pt performed dynamic gait with speed changes and head turns while conversing with sister with no LOB.  Pt's sister observed and was educated on pt's decreased initiation and was given ideas to encourage pt to initiate.  Pt performed gait, stairs and transfers with mod I.  Pt's sister expresses she feels comfortable with pt's level at d/c.  Individual therapy   Elan Brainerd 03/15/2012, 3:35 PM

## 2012-03-15 NOTE — Progress Notes (Signed)
Nutrition Follow-up  Continues on megace for appetite and Chocolate Ensure Supplements. RN reports that attention is slowly improving and that overall intake is as well. Drinking Ensure as scheduled.  Diet Order:  CHO Modified Medium, eating 50 - 80% of meals Supplement: Ensure Complete PO BID  Meds: Scheduled Meds:   . feeding supplement  237 mL Oral BID BM  . insulin aspart  0-15 Units Subcutaneous TID WC  . levETIRAcetam  500 mg Oral BID  . megestrol  400 mg Oral BID  . metFORMIN  500 mg Oral BID WC  . methylphenidate  10 mg Oral BID WC  . metoprolol tartrate  25 mg Oral BID  . multivitamin with minerals  1 tablet Oral Daily  . pantoprazole  40 mg Oral Q1200  . sertraline  150 mg Oral Daily  . topiramate  25 mg Oral BID  . DISCONTD: metFORMIN  250 mg Oral BID WC   Continuous Infusions:  PRN Meds:.acetaminophen, bisacodyl, cloNIDine, HYDROcodone-acetaminophen, ondansetron (ZOFRAN) IV, ondansetron, polyethylene glycol, sorbitol  Labs:  CMP     Component Value Date/Time   NA 138 03/02/2012 0635   K 3.7 03/02/2012 0635   CL 100 03/02/2012 0635   CO2 27 03/02/2012 0635   GLUCOSE 143* 03/02/2012 0635   BUN 9 03/02/2012 0635   CREATININE 0.54 03/02/2012 0635   CALCIUM 8.9 03/02/2012 0635   PROT 6.8 03/02/2012 0635   ALBUMIN 3.1* 03/02/2012 0635   AST 13 03/02/2012 0635   ALT 19 03/02/2012 0635   ALKPHOS 71 03/02/2012 0635   BILITOT 0.5 03/02/2012 0635   GFRNONAA >90 03/02/2012 0635   GFRAA >90 03/02/2012 0635   CBG (last 3)   Basename 03/15/12 0709 03/14/12 2122 03/14/12 1615  GLUCAP 137* 180* 197*     Intake/Output Summary (Last 24 hours) at 03/15/12 1112 Last data filed at 03/15/12 0900  Gross per 24 hour  Intake    720 ml  Output      0 ml  Net    720 ml    Weight Status:  88 kg, 5.3 kg down x 1 week  Estimated needs:  1600 - 1800 kcal, 85 - 95 grams protein  Nutrition Dx:  Inadequate oral intake r/t poor appetite and variable attention AEB meal intake mostly 50% and  refusals.  Goal:  Pt to consume at least 75% of meals and supplements.  Intervention:  RD provided coupons for Ensure Complete supplements when d/c. Continue current interventions.  Monitor:  Weights, labs, PO intake, I/O's  Adair Laundry Pager #:  [phone]

## 2012-03-15 NOTE — Discharge Summary (Signed)
  Discharge summary job [phone]

## 2012-03-15 NOTE — Discharge Summary (Signed)
Alicia Crawford, Alicia Crawford                 ACCOUNT NO.:  [id number]  MEDICAL RECORD NO.:  [id number]  LOCATION:  4025                         FACILITY:  [facility]  PHYSICIAN:  Ranelle Oyster, M.D.DATE OF BIRTH:  [birth year]  DATE OF ADMISSION:  03/01/2012 DATE OF DISCHARGE:  03/16/2012                              DISCHARGE SUMMARY   DISCHARGE DIAGNOSES: 1. Frontal subdural hemorrhage status post evacuation of hematoma on     Feb 25, 2012. 2. Support hose for deep vein thrombosis prophylaxis. 3. Pain management. 4. Seizure prophylaxis. 5. 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 6. Hypertension. 7. Diabetes mellitus. 8. Depression.  HISTORY OF PRESENT ILLNESS:  This is a 56 year old right-handed female with noted history of anoxic brain injury in November 2011 with cardiac arrest.  It was very high functioning prior to anoxic brain injury. Admitted Feb 25, 2012, after she fell backwards on a wet ramp, struck the back of her head.  She denied loss of consciousness.  The patient noted headache with nausea, vomiting.  CT of the head showed a large subdural hematoma.  Underwent left craniotomy for evacuation of hematoma on Feb 25, 2012, per Dr. Marikay Alar.  Placed on Keppra for seizure prophylaxis.  Postoperatively with aphasia, agitation, delirium. Followup Cardiology Services for hypertension, cardiac arrest in the past as well as bradycardia.  Currently on no antihypertensive medications and monitored.  She was on a regular diet.  She was admitted for comprehensive rehab program.  PAST MEDICAL HISTORY:  See discharge diagnoses.  SOCIAL HISTORY:  Lives with husband, assistance as needed.  FUNCTIONAL HISTORY:  Prior to admission was independent.  She was walking her dog daily.  Functional status upon admission to rehab services was moderate assist for ADLs, moderate assist for ambulation.  PHYSICAL EXAMINATION:  VITAL SIGNS:  Blood pressure 133/82,  pulse 73, temperature 98, respirations 18. GENERAL:  This was an alert female, difficulty at time to attend, to task.  During exam, she was aphasic, but was able to use yes, no, head nods. HEAD:  Craniotomy site was well healed. LUNGS:  Clear to auscultation. CARDIAC:  Rate controlled. ABDOMEN:  Soft, nontender.  Good bowel sounds.  REHABILITATION HOSPITAL COURSE:  The patient was admitted to Inpatient Rehab Services with therapies initiated on a 3-hour daily basis consisting of physical therapy, occupational therapy, speech therapy, and rehabilitation nursing.  The following issues were addressed during the patient's rehabilitation stay.  Pertaining to her frontal subdural hemorrhage, she had underwent evacuation of hematoma Feb 25, 2012, per Dr. Marikay Alar.  Surgical site healing nicely.  Support hose were in place for DVT prophylaxis.  She was using hydrocodone as needed for pain with good results.  She remained on Keppra for seizure prophylaxis with no seizure activity noted.  The patient with history of anoxic brain injury after cardiac arrest in 2011.  The patient independent prior to admission.  Blood pressures overall remained well controlled.  Lopressor had been added to her regimen for blood pressure controlled and any monitoring of bradycardia.  Topamax was added for bouts of headaches with good results.  She did have a  history of diabetes mellitus.  Her Glucophage had been resumed at 250 mg twice daily.  Blood sugars overall well controlled.  She continued Zoloft for history of depression.  The patient received weekly collaborative interdisciplinary team conferences to discuss estimated length of stay, family teaching, and any barriers to her discharge.  She continued to progress nicely. Ritalin was added to her regimen to help to attend to tasks and ability to attend her therapies with good results and this would be addressed with Dr. Faith Rogue on an outpatient  rehab basis.  She was continent of bowel and bladder.  Supervision with moderate cueing for ADLs, minimal assist to supervision for mobility.  She was able to communicate her needs with close following per speech therapy.  Full family teaching was completed with her husband and plan was to be discharged to home.  DISCHARGE MEDICATIONS: 1. Norco 1 tablet every 4 hours as needed pain. 2. Keppra 500 mg twice daily. 3. Glucophage 250 mg twice daily. 4. Ritalin 10 mg twice daily at 7:00 a.m. and 12 noon. 5. Lopressor 25 mg twice daily. 6. Multivitamin daily. 7. Protonix 40 mg daily. 8. Zoloft 150 mg daily. 9. Topamax 25 mg twice daily for headache.  DIET:  Diabetic diet.  SPECIAL INSTRUCTIONS:  The patient to follow up with Dr. Faith Rogue outpatient April 04, 2012; Dr. Marikay Alar, Neurosurgery call for appointment; Dr. Nanetta Batty call for appointment in 2 weeks; Dr. Ancil Boozer March 27, 2012, 11:30 a.m.  Home therapies had been arranged.     Mariam Dollar, P.A.   ______________________________ Ranelle Oyster, M.D.    DA/MEDQ  D:  03/15/2012  T:  03/15/2012  Job:  [id number]  cc:   Ancil Boozer, MD Nanetta Batty, M.D. Tia Alert, MD

## 2012-03-15 NOTE — Progress Notes (Signed)
Physical Therapy Session Note  Patient Details  Name: Sylwia Cuervo MRN: [id number] Date of Birth: 12/08/1955  Today's Date: 03/15/2012 Time: 0800-0828 Time Calculation (min): 28 min  Short Term Goals: See d/c goals  Skilled Therapeutic Interventions/Progress Updates:    Gait training- pt just getting up this morning, BOS initially wide but decreased to normal after about 25 ft, no LOB Gait velocity 2.45 ft/sec = neighborhood ambulator, stair training for activity tolerance  Therapeutic activity- PT demonstrated floor transfer and pt return demonstrated I; balance training with speech training- bouncing and catching ball while naming days of week, months of year, singing jingle bells with min cues, I sit to stand and stand pivot transfers; pt demonstrated good safety awareness during this session  Therapy Documentation Precautions:  Precautions Precautions: Fall Precaution Comments: decreased safety awareness Restrictions Weight Bearing Restrictions: No     Pain:c/o HA, RN informed, able to participate and got tylenol at end of session Pain Assessment Pain Assessment: Faces Pain Score:   8 Faces Pain Scale: Hurts whole lot Pain Type: Acute pain Pain Location: Leg Pain Orientation: Left Pain Descriptors: Headache Pain Frequency: Constant Pain Onset: On-going Patients Stated Pain Goal: 2 Pain Intervention(s): Medication (See eMAR) (tylenol 650 mg po) Multiple Pain Sites: No Mobility: Transfers Sit to Stand: 7: Independent Stand to Sit: 7: Independent Stand Pivot Transfers: 7: Independent Locomotion : Ambulation Ambulation/Gait Assistance: 6: Modified independent (Device/Increase time) Gait Gait Pattern: Lateral trunk lean to left (in L stance) Gait velocity: 2.45 ft/sec Stairs / Additional Locomotion Stairs Assistance: 6: Modified independent (Device/Increase time) Stairs Assistance Details (indicate cue type and reason): step to with one rail, reciprocal  stepping with 2 (self selected) Number of Stairs: 15   Other Treatments: Treatments Therapeutic Activity: floor transfer for fall recovery I pushing up on furniture  See FIM for current functional status  Therapy/Group: Individual Therapy  Michaelene Song 03/15/2012, 8:37 AM

## 2012-03-16 DIAGNOSIS — W19XXXA Unspecified fall, initial encounter: Secondary | ICD-10-CM

## 2012-03-16 DIAGNOSIS — R4701 Aphasia: Secondary | ICD-10-CM

## 2012-03-16 DIAGNOSIS — S065X9A Traumatic subdural hemorrhage with loss of consciousness of unspecified duration, initial encounter: Secondary | ICD-10-CM

## 2012-03-16 DIAGNOSIS — S065XAA Traumatic subdural hemorrhage with loss of consciousness status unknown, initial encounter: Secondary | ICD-10-CM

## 2012-03-16 DIAGNOSIS — Z5189 Encounter for other specified aftercare: Secondary | ICD-10-CM

## 2012-03-16 LAB — GLUCOSE, CAPILLARY: Glucose-Capillary: 131 mg/dL — ABNORMAL HIGH (ref 70–99)

## 2012-03-16 MED ORDER — LEVETIRACETAM 250 MG PO TABS: 250.0000 mg | ORAL_TABLET | Freq: Two times a day (BID) | ORAL | Status: DC

## 2012-03-16 MED ORDER — METOPROLOL TARTRATE 25 MG PO TABS: 25.0000 mg | ORAL_TABLET | Freq: Two times a day (BID) | ORAL | Status: DC

## 2012-03-16 MED ORDER — HYDROCODONE-ACETAMINOPHEN 5-325 MG PO TABS: 1.0000 | ORAL_TABLET | ORAL | Status: AC | PRN

## 2012-03-16 MED ORDER — TOPIRAMATE 25 MG PO TABS: 25.0000 mg | ORAL_TABLET | Freq: Two times a day (BID) | ORAL | Status: DC

## 2012-03-16 MED ORDER — METHYLPHENIDATE HCL 10 MG PO TABS: 10.0000 mg | ORAL_TABLET | Freq: Two times a day (BID) | ORAL | Status: DC

## 2012-03-16 MED ORDER — LEVETIRACETAM 250 MG PO TABS
250.0000 mg | ORAL_TABLET | Freq: Two times a day (BID) | ORAL | Status: DC
  Filled 2012-03-16 (×2): qty 1

## 2012-03-16 NOTE — Progress Notes (Signed)
Speech Language Pathology Discharge Summary  Patient Details  Name: [name] MRN: [id number] Date of Birth: 01-02-1956  Today's Date: 03/16/2012 Time:  -     Patient has met 5 of 5 long term goals.  Patient to discharge at [facility] assist level for basic cognitive-linguistic function, and moderate assist/cue level for higher level expressive-receptive language and multiple step problem solving, functional tasks.  Reasons goals not met: N/A   Clinical Impression/Discharge Summary: Alicia Crawford has demonstrated functional improvements in her selective attention, safety awareness, basic functional problem solving with ADL's and initiating requests for assistance via verbal/non-verbal communication. Her receptive language has improved and she is able to respond accurately to yes/no questions of basic and complex levels, and follow multiple step verbal cues/commands for task completion. She continues to struggle with verbal expression of wants/needs/thoughts, and currently requires moderate verbal, visual, context cues for her to adequately express at this level. She becomes anxious and frustrated when she gets stuck on a topic  or word (perseveration) and requires verbal cues to move conversation, etc , forward (topic shifting cues, follow-up question cues). [name] continues to require 24 hour supervision and assistance for safety and to be able to express her wants/needs/thoughts as well as to have them met. [name] will benefit from skilled speech-language pathology services to continue to build upon and increase her speech-language and cognitive abilities at the next venue of care (home health  versus outpatientSLP).  Care Partner:  Caregiver Able to Provide Assistance: Yes  Type of Caregiver Assistance: Physical;Cognitive  Recommendation:  Home Health SLP;Outpatient SLP (prefer outpatient if patient is able)  Rationale for SLP Follow Up: Maximize functional communication;Maximize cognitive  function and independence;Reduce caregiver burden   Equipment:     Reasons for discharge: Discharged from hospital;Treatment goals met   Patient/Family Agrees with Progress Made and Goals Achieved: Yes   See FIM for current functional status  Pablo Lawrence 03/16/2012, 11:53 AM  Angela Nevin, MA, CCC-SLP Speech-Language Pathologist [facility] Inpatient Rehabilitation

## 2012-03-16 NOTE — Progress Notes (Addendum)
Social Work  Discharge Note  The overall goal for the admission was met for:   Discharge location: Yes - home with spouse and daughter providing 24/7 supervision  Length of Stay: Yes - 15 days  Discharge activity level: Yes - supervision  Home/community participation: Yes  Services provided included: MD, RD, PT, OT, SLP, RN, CM, TR, Pharmacy and SW  Financial Services: Private Insurance: Geophysicist/field seismologist  Follow-up services arranged: Outpatient: OT and ST via [facility]  Comments (or additional information):  Provided pt and family with information on the local TBI support group, [facility], private duty sitter agencies and Adult Day Care programs  Patient/Family verbalized understanding of follow-up arrangements: Yes  Individual responsible for coordination of the follow-up plan: husband  Confirmed correct DME delivered: NA   Koury Roddy

## 2012-03-16 NOTE — Progress Notes (Signed)
Patient, spouse ans sister received written and verbal discharge instructions from Deatra Ina, [place]. Patient's  spouse and sister aware of follow up appointments, new medications with prescriptions, activity limitations,  Denies any questions or concerns. Personal belongings packed by family, patient taken down by NT to private vehicle via wheelchair. [place], [place] Jester

## 2012-03-16 NOTE — Progress Notes (Signed)
Physical Therapy Discharge Summary  Patient Details  Name: Alicia Crawford MRN: [id number] Date of Birth: Mar 02, 1956  Today's Date: 03/16/2012    Patient has met 7 of 8 long term goals due to improved activity tolerance, improved balance, improved postural control, ability to compensate for deficits, improved attention and improved coordination.  Patient to discharge at an ambulatory level Modified Independent.   Patient's care partner is independent to provide the necessary cognitive assistance at discharge.  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  Equipment: No equipment provided  Reasons for discharge: treatment goals met and discharge from hospital  Patient/family agrees with progress made and goals achieved: Yes  PT Discharge  Cognition Overall Cognitive Status: Impaired Attention: Sustained;Selective Sustained Attention: Impaired Selective Attention: Impaired Awareness: Impaired Awareness Impairment: Intellectual impairment Initiating: Impaired Safety/Judgment: Impaired Comments: poor initiation, selective attention limited to 2 minutes [place] Scales of Cognitive Functioning: Confused/inappropriate/non-agitated Sensation Sensation Light Touch: Appears Intact Proprioception: Appears Intact Coordination Gross Motor Movements are Fluid and Coordinated: Yes Motor  Motor Motor: Within Functional Limits   Trunk/Postural Assessment  Cervical Assessment Cervical Assessment: Within Functional Limits Thoracic Assessment Thoracic Assessment: Within Functional Limits Lumbar Assessment Lumbar Assessment: Within Functional Limits Postural Control Postural Control: Within Functional Limits  Balance Static Standing Balance Static Standing -  Level of Assistance: 7: Independent Dynamic Standing Balance Dynamic Standing - Level of Assistance: 6: Modified independent (Device/Increase time) Extremity Assessment      RLE Assessment RLE Assessment: Within Functional Limits LLE Assessment LLE Assessment: Within Functional Limits  See FIM for current functional status  Hilary Milks 03/16/2012, 7:55 AM

## 2012-03-16 NOTE — Progress Notes (Addendum)
Subjective/Complaints: Occasional headaches. Otherwise no complaints today Review of Systems  Psychiatric/Behavioral: The patient is nervous/anxious.    Objective: Vital Signs: Blood pressure 140/85, pulse 80, temperature 97.3 F (36.3 C), temperature source Oral, resp. rate 18, weight 87.5 kg (192 lb 14.4 oz), SpO2 99.00%. No results found. Results for orders placed during the hospital encounter of 03/01/12 (from the past 72 hour(s))  GLUCOSE, CAPILLARY     Status: Abnormal   Collection Time   03/13/12 11:50 AM      Component Value Range Comment   Glucose-Capillary 192 (*) 70 - 99 mg/dL    Comment 1 Notify RN     GLUCOSE, CAPILLARY     Status: Abnormal   Collection Time   03/13/12  4:22 PM      Component Value Range Comment   Glucose-Capillary 233 (*) 70 - 99 mg/dL    Comment 1 Notify RN     GLUCOSE, CAPILLARY     Status: Abnormal   Collection Time   03/13/12  8:40 PM      Component Value Range Comment   Glucose-Capillary 172 (*) 70 - 99 mg/dL    Comment 1 Notify RN     GLUCOSE, CAPILLARY     Status: Abnormal   Collection Time   03/14/12  7:26 AM      Component Value Range Comment   Glucose-Capillary 151 (*) 70 - 99 mg/dL    Comment 1 Notify RN     GLUCOSE, CAPILLARY     Status: Abnormal   Collection Time   03/14/12 11:45 AM      Component Value Range Comment   Glucose-Capillary 218 (*) 70 - 99 mg/dL    Comment 1 Notify RN     GLUCOSE, CAPILLARY     Status: Abnormal   Collection Time   03/14/12  4:15 PM      Component Value Range Comment   Glucose-Capillary 197 (*) 70 - 99 mg/dL   GLUCOSE, CAPILLARY     Status: Abnormal   Collection Time   03/14/12  9:22 PM      Component Value Range Comment   Glucose-Capillary 180 (*) 70 - 99 mg/dL   GLUCOSE, CAPILLARY     Status: Abnormal   Collection Time   03/15/12  7:09 AM      Component Value Range Comment   Glucose-Capillary 137 (*) 70 - 99 mg/dL    Comment 1 Notify RN     GLUCOSE, CAPILLARY     Status: Abnormal   Collection  Time   03/15/12 12:18 PM      Component Value Range Comment   Glucose-Capillary 212 (*) 70 - 99 mg/dL    Comment 1 Notify RN     GLUCOSE, CAPILLARY     Status: Abnormal   Collection Time   03/15/12  4:28 PM      Component Value Range Comment   Glucose-Capillary 241 (*) 70 - 99 mg/dL   GLUCOSE, CAPILLARY     Status: Abnormal   Collection Time   03/15/12  9:37 PM      Component Value Range Comment   Glucose-Capillary 193 (*) 70 - 99 mg/dL    Comment 1 Notify RN     GLUCOSE, CAPILLARY     Status: Abnormal   Collection Time   03/16/12  7:08 AM      Component Value Range Comment   Glucose-Capillary 131 (*) 70 - 99 mg/dL    Comment 1 Notify RN  HEENT: normal Cardio: RRR Resp: CTA B/L GI: BS positive Extremity:  No Edema Skin:   Wound C/D/I Neuro: Abnormal Motor 4/5 on R 5/5 on L, Abnormal FMC Ataxic/ dec FMC, Reflexes: 3+, Aphasic and Apraxic Musc/Skel:  Other swelling L scalpwound clean and intact- staples out Word finding deficits, sometimes perseverative. Able to repeat. Could not tell me today's date when i said tomorrow is 6/14  Assessment/Plan: 1. Functional deficits secondary to L SDH, RHP and aphasia which require 3+ hours per day of interdisciplinary therapy in a comprehensive inpatient rehab setting. Physiatrist is providing close team supervision and 24 hour management of active medical problems listed below. Physiatrist and rehab team continue to assess barriers to discharge/monitor patient progress toward functional and medical goals. FIM: FIM - Bathing Bathing Steps Patient Completed: Chest;Right Arm;Left Arm;Right upper leg;Left upper leg;Left lower leg (including foot);Abdomen;Front perineal area;Buttocks;Right lower leg (including foot) Bathing: 5: Supervision: Safety issues/verbal cues  FIM - Upper Body Dressing/Undressing Upper body dressing/undressing steps patient completed: Thread/unthread right sleeve of pullover shirt/dresss;Thread/unthread left sleeve  of pullover shirt/dress;Put head through opening of pull over shirt/dress;Pull shirt over trunk Upper body dressing/undressing: 5: Supervision: Safety issues/verbal cues FIM - Lower Body Dressing/Undressing Lower body dressing/undressing steps patient completed: Thread/unthread right underwear leg;Pull underwear up/down;Thread/unthread right pants leg;Thread/unthread left underwear leg;Pull pants up/down;Thread/unthread left pants leg;Don/Doff right sock;Don/Doff left sock;Don/Doff left shoe;Fasten/unfasten right shoe;Fasten/unfasten left shoe;Don/Doff right shoe Lower body dressing/undressing: 5: Supervision: Safety issues/verbal cues  FIM - Toileting Toileting steps completed by patient: Adjust clothing prior to toileting;Performs perineal hygiene;Adjust clothing after toileting Toileting Assistive Devices: Grab bar or rail for support Toileting: 6: More than reasonable amount of time  FIM - Diplomatic Services operational officer Devices: Grab bars Toilet Transfers: 6-More than reasonable amt of time  FIM - Banker Devices: Bed rails Bed/Chair Transfer: 6: More than reasonable amt of time  FIM - Locomotion: Wheelchair Locomotion: Wheelchair: 0: Activity did not occur FIM - Locomotion: Ambulation Ambulation/Gait Assistance: 6: Modified independent (Device/Increase time) Locomotion: Ambulation: 6: Travels 150 ft or more independently/takes more than reasonable amount of time  Comprehension Comprehension Mode: Auditory Comprehension: 4-Understands basic 75 - 89% of the time/requires cueing 10 - 24% of the time  Expression Expression Mode: Verbal Expression: 2-Expresses basic 25 - 49% of the time/requires cueing 50 - 75% of the time. Uses single words/gestures.  Social Interaction Social Interaction: 4-Interacts appropriately 75 - 89% of the time - Needs redirection for appropriate language or to initiate interaction.  Problem  Solving Problem Solving Mode: Not assessed Problem Solving: 3-Solves basic 50 - 74% of the time/requires cueing 25 - 49% of the time  Memory Memory: 3-Recognizes or recalls 50 - 74% of the time/requires cueing 25 - 49% of the time  Medical Problem List and Plan:  1. 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 DVT Prophylaxis/Anticoagulation: Support hose. Ambulation daily.  3. Pain Management: Norco as needed . topamax as prophylaxis. 4. seizure prophylaxis. Keppra - decrease to 250mg  bid. Can dc when i see her next month..  Monitor for any seizure activity. Continue topamax at 25mg  bid---can change to qhs schedule if she has drowsiness during the day. 5. 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 Patient independent prior to admission- was doing very well. 6. Hypertension with history of bradycardia. Followup cardiology services as needed. Currently on no antihypertensive medications. Patient on Cozaar 50 mg daily,lopressor 25mg  bid. Prn clonidine also. 7. Diabetes  mellitus.Marland Kitchenglucophage-increase back to 500mg  bid  - continue Check CBGs a.c. and at bedtime  8. Depression. Zoloft 150 mg daily. Provide emotional support and positive reinforcement   -anxious at times. 9. Neuropsych: anxiety,depression increased with compromised attention and language deficits.  -ritalin 10mg  for attention. Can continue at current dosing until follow up with me.  -spacing and pacing therapies should benefit her also.  -continue to provide positive reinforcement 10. FEN- megace trial  -regular diet  -intake better      LOS (Days) 15 A FACE TO FACE EVALUATION WAS PERFORMED  Zamar Odwyer T 03/16/2012, 8:08 AM

## 2012-03-21 ENCOUNTER — Ambulatory Visit: Payer: PRIVATE HEALTH INSURANCE | Attending: Physical Medicine & Rehabilitation | Admitting: Occupational Therapy

## 2012-03-21 ENCOUNTER — Ambulatory Visit: Payer: PRIVATE HEALTH INSURANCE | Admitting: Speech Pathology

## 2012-03-21 DIAGNOSIS — Z5189 Encounter for other specified aftercare: Secondary | ICD-10-CM | POA: Insufficient documentation

## 2012-03-21 DIAGNOSIS — R4701 Aphasia: Secondary | ICD-10-CM | POA: Insufficient documentation

## 2012-03-21 DIAGNOSIS — R41841 Cognitive communication deficit: Secondary | ICD-10-CM | POA: Insufficient documentation

## 2012-03-22 ENCOUNTER — Encounter: Payer: PRIVATE HEALTH INSURANCE | Admitting: Speech Pathology

## 2012-03-27 ENCOUNTER — Ambulatory Visit: Payer: PRIVATE HEALTH INSURANCE | Admitting: *Deleted

## 2012-03-27 ENCOUNTER — Encounter: Payer: PRIVATE HEALTH INSURANCE | Admitting: Occupational Therapy

## 2012-03-28 ENCOUNTER — Ambulatory Visit: Payer: PRIVATE HEALTH INSURANCE | Admitting: Occupational Therapy

## 2012-04-04 ENCOUNTER — Encounter: Payer: Self-pay | Admitting: Physical Medicine & Rehabilitation

## 2012-04-04 ENCOUNTER — Encounter
Payer: PRIVATE HEALTH INSURANCE | Attending: Physical Medicine & Rehabilitation | Admitting: Physical Medicine & Rehabilitation

## 2012-04-04 VITALS — BP 136/73 | HR 86 | Resp 16 | Ht 65.0 in | Wt 189.6 lb

## 2012-04-04 DIAGNOSIS — S065X9A Traumatic subdural hemorrhage with loss of consciousness of unspecified duration, initial encounter: Secondary | ICD-10-CM

## 2012-04-04 DIAGNOSIS — I701 Atherosclerosis of renal artery: Secondary | ICD-10-CM

## 2012-04-04 DIAGNOSIS — S065XAA Traumatic subdural hemorrhage with loss of consciousness status unknown, initial encounter: Secondary | ICD-10-CM

## 2012-04-04 DIAGNOSIS — F411 Generalized anxiety disorder: Secondary | ICD-10-CM

## 2012-04-04 DIAGNOSIS — F419 Anxiety disorder, unspecified: Secondary | ICD-10-CM

## 2012-04-04 DIAGNOSIS — I2510 Atherosclerotic heart disease of native coronary artery without angina pectoris: Secondary | ICD-10-CM

## 2012-04-04 DIAGNOSIS — I6200 Nontraumatic subdural hemorrhage, unspecified: Secondary | ICD-10-CM

## 2012-04-04 NOTE — Progress Notes (Signed)
Subjective:    Patient ID: Alicia Crawford, female    DOB: [birth year], 56 y.o.   MRN: [id number]  HPI  Alicia Crawford is back regarding her TBI and associated language and balance issues. She's having anxiety and frustrated at times over her perceived lack of progress. She is unsure why she's on ritalin. Family reports that she is having headaches from the front to rear of her head.   She's sleeping fairly well. Her balance has been good.  She's still working with OT and SLP. Family works daily on cognitive and daily exercises. She's looking at getting back to the [facility] for exercise. Doing ADL's on her own also.  Pain Inventory Average Pain 5 Pain Right Now 5 My pain is intermittent, dull and stabbing  In the last 24 hours, has pain interfered with the following? General activity 4 Relation with others 6 Enjoyment of life 9 What TIME of day is your pain at its worst? daytime Sleep (in general) Fair  Pain is worse with: unsure Pain improves with: rest Relief from Meds: 5  Mobility walk without assistance how many minutes can you walk? 10 ability to climb steps?  yes do you drive?  no transfers alone  Function disabled: date disabled  retired I need assistance with the following:  meal prep, household duties and shopping  Neuro/Psych weakness confusion anxiety  Prior Studies Any changes since last visit?  no  Physicians involved in your care Any changes since last visit?  no   Family History  Problem Relation Age of Onset  . Heart disease Other   . Diabetes Other   . Hypertension Other   . Alcohol abuse Other   . Mental illness Other   . Heart disease Mother   . Heart disease Father   . Heart disease Sister    History   Social History  . Marital Status: Married    Spouse Name: N/A    Number of Children: N/A  . Years of Education: N/A   Social History Main Topics  . Smoking status: Former Smoker -- 1.0 packs/day for 30 years    Types: Cigarettes   Quit date: 06/22/2005  . Smokeless tobacco: Never Used  . Alcohol Use: Yes     01/17/12 "used to have glass of wine now & then; last wine was ~ 2011"  . Drug Use: No  . Sexually Active: Yes   Other Topics Concern  . None   Social History Narrative  . None   Past Surgical History  Procedure Date  . Ovarian cyst removal 1983  . Stomach and skin tuck 2000  . Posterior fusion lumbar spine ` 2000  . Cholecystectomy ~ 1980's  . 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 Cardiac surgery   . Back surgery   . Craniotomy 02/25/2012    Procedure: CRANIOTOMY HEMATOMA EVACUATION SUBDURAL;  Surgeon: Tia Alert, MD;  Location: [facility];  Service: Neurosurgery;  Laterality: Left;  Left craniotomy for evacuation of subdural hematoma   Past Medical History  Diagnosis Date  . Myocardial infarction   . Hypertension   . Leg pain   . Anoxic brain injury 08/2010    "w/cardiac arrest"  . Rotator cuff tendonitis   . Complication of anesthesia     [place]  . High cholesterol   . Cardiac arrest 08/04/2010  . Type II diabetes mellitus   . Blood transfusion 09/2010  . Depression   . DEMENTIA   .  Coronary artery disease    BP 136/73  Pulse 86  Resp 16  Ht 5\' 5"  (1.651 m)  Wt 189 lb 9.6 oz (86.002 kg)  BMI 31.55 kg/m2  SpO2 99%    Review of Systems  Neurological: Positive for headaches.  Psychiatric/Behavioral: Positive for confusion. The patient is nervous/anxious.   All other systems reviewed and are negative.       Objective:   Physical Exam  Constitutional: She appears well-developed and well-nourished.  HENT:  Head: Normocephalic and atraumatic.  Right Ear: External ear normal.  Left Ear: External ear normal.  Nose: Nose normal.  Mouth/Throat: Oropharynx is clear and moist.  Eyes: Conjunctivae and EOM are normal. Pupils are equal, round, and reactive to light.  Neck: Normal range of motion.  Cardiovascular: Normal rate and regular rhythm.     Pulmonary/Chest: Effort normal and breath sounds normal.  Abdominal: Soft. Bowel sounds are normal.  Neurological: She is alert. She has normal strength. No cranial nerve deficit or sensory deficit.       Able to tell me the full date. Identifies all simple objects. Has difficulty with subtracting 7 from 100 however. Language as a whole much improved with better word finding abilities. Anxiety sometimes gets in the way.  Gait is stable  But notable for bit of a steppage pattern on the right.  Psychiatric: Her mood appears anxious. Her speech is delayed.         Assessment & Plan:  1. 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 DVT Prophylaxis/Anticoagulation: ambulating well..  3. Pain Management: tylenol . topamax as prophylaxis for headache. 4. seizure prophylaxis. No longer indicated at this point. Decrease keppra to 250mg  qhs for 2weeks  Then stop. She'll remain on topamax for headache prophylaxis. 5. 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 Patient independent prior to admission- was doing very well.  6. Hypertension with history of bradycardia. Continue low dose lopressor.  7. Diabetes mellitus.Marland Kitchenglucophage- 8. Depression. Zoloft 150 mg daily. Family toProvide emotional support and positive reinforcement  . Dr. Francee Gentile would be helpful as well [facility]). Im hopeful that weaning the ritalin will help her anxiety also. 9. Neuropsych: anxiety,depression increased with compromised attention and language deficits.  - wean ritalin 10mg  for attention. Decrease to qday for 2weeks then stop. 10. Follow up with me in 3 months.

## 2012-04-04 NOTE — Patient Instructions (Signed)
DECREASE YOUR RITALIN TO 10MG  DAILY FOR 2 WEEKS THEN STOP.  TRY A HALF IN THE MORNING IF THE STOP IS TOO ABRUPT   KEPPRA: TAKE ONE AT NIGHT FOR 2 WEEKS THEN STOP.   YOU NEED ADEQUATE SLEEP!!!!!!8-10 HOURS PER NIGHT.  FOLLOW UP WITH DR. Christean Leaf REGARDING YOUR MOOD.

## 2012-04-06 ENCOUNTER — Encounter: Payer: PRIVATE HEALTH INSURANCE | Admitting: *Deleted

## 2012-04-11 ENCOUNTER — Ambulatory Visit: Payer: PRIVATE HEALTH INSURANCE | Attending: Physical Medicine & Rehabilitation | Admitting: Occupational Therapy

## 2012-04-11 ENCOUNTER — Ambulatory Visit: Payer: PRIVATE HEALTH INSURANCE

## 2012-04-11 ENCOUNTER — Other Ambulatory Visit: Payer: Self-pay | Admitting: Neurological Surgery

## 2012-04-11 DIAGNOSIS — R4701 Aphasia: Secondary | ICD-10-CM | POA: Insufficient documentation

## 2012-04-11 DIAGNOSIS — I6200 Nontraumatic subdural hemorrhage, unspecified: Secondary | ICD-10-CM

## 2012-04-11 DIAGNOSIS — R41841 Cognitive communication deficit: Secondary | ICD-10-CM | POA: Insufficient documentation

## 2012-04-11 DIAGNOSIS — Z5189 Encounter for other specified aftercare: Secondary | ICD-10-CM | POA: Insufficient documentation

## 2012-04-13 ENCOUNTER — Ambulatory Visit: Payer: PRIVATE HEALTH INSURANCE | Admitting: Occupational Therapy

## 2012-04-13 ENCOUNTER — Ambulatory Visit: Payer: PRIVATE HEALTH INSURANCE

## 2012-04-16 ENCOUNTER — Ambulatory Visit
Admission: RE | Admit: 2012-04-16 | Discharge: 2012-04-16 | Disposition: A | Discharge status: Home / Self Care | Payer: PRIVATE HEALTH INSURANCE | Source: Ambulatory Visit | Attending: Neurological Surgery | Admitting: Neurological Surgery

## 2012-04-16 DIAGNOSIS — I6200 Nontraumatic subdural hemorrhage, unspecified: Secondary | ICD-10-CM

## 2012-04-17 ENCOUNTER — Ambulatory Visit: Payer: PRIVATE HEALTH INSURANCE

## 2012-04-17 ENCOUNTER — Ambulatory Visit: Payer: PRIVATE HEALTH INSURANCE | Admitting: Occupational Therapy

## 2012-04-19 ENCOUNTER — Ambulatory Visit: Payer: PRIVATE HEALTH INSURANCE | Admitting: Physical Therapy

## 2012-04-20 ENCOUNTER — Ambulatory Visit: Payer: PRIVATE HEALTH INSURANCE

## 2012-04-20 ENCOUNTER — Ambulatory Visit: Payer: PRIVATE HEALTH INSURANCE | Admitting: Occupational Therapy

## 2012-04-23 ENCOUNTER — Ambulatory Visit: Payer: PRIVATE HEALTH INSURANCE | Admitting: Occupational Therapy

## 2012-04-23 ENCOUNTER — Ambulatory Visit: Payer: PRIVATE HEALTH INSURANCE | Admitting: Speech Pathology

## 2012-04-25 ENCOUNTER — Ambulatory Visit: Payer: PRIVATE HEALTH INSURANCE | Admitting: Physical Therapy

## 2012-04-25 ENCOUNTER — Ambulatory Visit: Payer: PRIVATE HEALTH INSURANCE | Admitting: Occupational Therapy

## 2012-04-25 ENCOUNTER — Ambulatory Visit: Payer: PRIVATE HEALTH INSURANCE | Admitting: Speech Pathology

## 2012-04-27 ENCOUNTER — Ambulatory Visit: Payer: PRIVATE HEALTH INSURANCE | Admitting: Physical Therapy

## 2012-04-30 ENCOUNTER — Ambulatory Visit: Payer: PRIVATE HEALTH INSURANCE | Admitting: Occupational Therapy

## 2012-04-30 ENCOUNTER — Ambulatory Visit: Payer: PRIVATE HEALTH INSURANCE | Admitting: *Deleted

## 2012-04-30 ENCOUNTER — Ambulatory Visit: Payer: PRIVATE HEALTH INSURANCE | Admitting: Speech Pathology

## 2012-04-30 ENCOUNTER — Ambulatory Visit: Payer: PRIVATE HEALTH INSURANCE | Admitting: Physical Therapy

## 2012-05-02 ENCOUNTER — Ambulatory Visit: Payer: PRIVATE HEALTH INSURANCE

## 2012-05-02 ENCOUNTER — Ambulatory Visit: Payer: PRIVATE HEALTH INSURANCE | Admitting: Occupational Therapy

## 2012-05-02 ENCOUNTER — Ambulatory Visit: Payer: PRIVATE HEALTH INSURANCE | Admitting: Rehabilitative and Restorative Service Providers"

## 2012-05-04 ENCOUNTER — Other Ambulatory Visit: Payer: Self-pay | Admitting: *Deleted

## 2012-05-04 MED ORDER — TOPIRAMATE 25 MG PO TABS: 25.0000 mg | ORAL_TABLET | Freq: Two times a day (BID) | ORAL | Status: DC

## 2012-05-07 ENCOUNTER — Encounter: Payer: PRIVATE HEALTH INSURANCE | Admitting: Surgery

## 2012-05-07 ENCOUNTER — Ambulatory Visit: Payer: PRIVATE HEALTH INSURANCE | Admitting: Speech Pathology

## 2012-05-07 ENCOUNTER — Ambulatory Visit: Payer: PRIVATE HEALTH INSURANCE | Attending: Physical Medicine & Rehabilitation | Admitting: Physical Therapy

## 2012-05-07 DIAGNOSIS — R41841 Cognitive communication deficit: Secondary | ICD-10-CM | POA: Insufficient documentation

## 2012-05-07 DIAGNOSIS — Z5189 Encounter for other specified aftercare: Secondary | ICD-10-CM | POA: Insufficient documentation

## 2012-05-07 DIAGNOSIS — R4701 Aphasia: Secondary | ICD-10-CM | POA: Insufficient documentation

## 2012-05-09 ENCOUNTER — Ambulatory Visit: Payer: PRIVATE HEALTH INSURANCE | Admitting: Physical Therapy

## 2012-05-09 ENCOUNTER — Ambulatory Visit: Payer: PRIVATE HEALTH INSURANCE

## 2012-05-09 ENCOUNTER — Ambulatory Visit: Payer: PRIVATE HEALTH INSURANCE | Admitting: Occupational Therapy

## 2012-05-14 ENCOUNTER — Ambulatory Visit: Payer: PRIVATE HEALTH INSURANCE | Admitting: Speech Pathology

## 2012-05-14 ENCOUNTER — Ambulatory Visit: Payer: PRIVATE HEALTH INSURANCE | Admitting: Physical Therapy

## 2012-05-14 ENCOUNTER — Ambulatory Visit: Payer: PRIVATE HEALTH INSURANCE | Admitting: Occupational Therapy

## 2012-05-16 ENCOUNTER — Ambulatory Visit: Payer: PRIVATE HEALTH INSURANCE | Admitting: Physical Therapy

## 2012-05-16 ENCOUNTER — Ambulatory Visit: Payer: PRIVATE HEALTH INSURANCE | Admitting: Occupational Therapy

## 2012-05-16 ENCOUNTER — Ambulatory Visit: Payer: PRIVATE HEALTH INSURANCE

## 2012-05-25 ENCOUNTER — Ambulatory Visit: Payer: PRIVATE HEALTH INSURANCE

## 2012-05-25 ENCOUNTER — Ambulatory Visit: Payer: PRIVATE HEALTH INSURANCE | Admitting: Physical Therapy

## 2012-05-25 ENCOUNTER — Ambulatory Visit: Payer: PRIVATE HEALTH INSURANCE | Admitting: Occupational Therapy

## 2012-05-30 ENCOUNTER — Ambulatory Visit: Payer: PRIVATE HEALTH INSURANCE | Admitting: Occupational Therapy

## 2012-05-30 ENCOUNTER — Ambulatory Visit: Payer: PRIVATE HEALTH INSURANCE | Admitting: Speech Pathology

## 2012-05-30 ENCOUNTER — Ambulatory Visit: Payer: PRIVATE HEALTH INSURANCE | Admitting: Physical Therapy

## 2012-05-31 ENCOUNTER — Other Ambulatory Visit (HOSPITAL_COMMUNITY): Payer: Self-pay | Admitting: Family Medicine

## 2012-05-31 DIAGNOSIS — Z1231 Encounter for screening mammogram for malignant neoplasm of breast: Secondary | ICD-10-CM

## 2012-06-06 ENCOUNTER — Ambulatory Visit: Payer: PRIVATE HEALTH INSURANCE

## 2012-06-06 ENCOUNTER — Ambulatory Visit: Payer: PRIVATE HEALTH INSURANCE | Admitting: Physical Therapy

## 2012-06-06 ENCOUNTER — Ambulatory Visit: Payer: PRIVATE HEALTH INSURANCE | Attending: Physical Medicine & Rehabilitation | Admitting: Occupational Therapy

## 2012-06-06 DIAGNOSIS — Z5189 Encounter for other specified aftercare: Secondary | ICD-10-CM | POA: Insufficient documentation

## 2012-06-06 DIAGNOSIS — R41841 Cognitive communication deficit: Secondary | ICD-10-CM | POA: Insufficient documentation

## 2012-06-06 DIAGNOSIS — R4701 Aphasia: Secondary | ICD-10-CM | POA: Insufficient documentation

## 2012-06-12 ENCOUNTER — Ambulatory Visit: Payer: PRIVATE HEALTH INSURANCE | Admitting: Physical Therapy

## 2012-06-12 ENCOUNTER — Ambulatory Visit: Payer: PRIVATE HEALTH INSURANCE | Admitting: Occupational Therapy

## 2012-06-12 ENCOUNTER — Ambulatory Visit: Payer: PRIVATE HEALTH INSURANCE

## 2012-06-13 ENCOUNTER — Ambulatory Visit: Payer: PRIVATE HEALTH INSURANCE | Admitting: Physical Therapy

## 2012-06-13 ENCOUNTER — Encounter: Payer: PRIVATE HEALTH INSURANCE | Admitting: Occupational Therapy

## 2012-06-19 ENCOUNTER — Ambulatory Visit: Payer: PRIVATE HEALTH INSURANCE

## 2012-06-19 ENCOUNTER — Ambulatory Visit: Payer: PRIVATE HEALTH INSURANCE | Admitting: Physical Therapy

## 2012-06-19 ENCOUNTER — Ambulatory Visit: Payer: PRIVATE HEALTH INSURANCE | Admitting: Occupational Therapy

## 2012-06-29 ENCOUNTER — Ambulatory Visit: Payer: PRIVATE HEALTH INSURANCE

## 2012-07-04 ENCOUNTER — Ambulatory Visit: Payer: PRIVATE HEALTH INSURANCE | Attending: Physical Medicine & Rehabilitation | Admitting: Speech Pathology

## 2012-07-04 DIAGNOSIS — Z5189 Encounter for other specified aftercare: Secondary | ICD-10-CM | POA: Insufficient documentation

## 2012-07-04 DIAGNOSIS — R4701 Aphasia: Secondary | ICD-10-CM | POA: Insufficient documentation

## 2012-07-04 DIAGNOSIS — R41841 Cognitive communication deficit: Secondary | ICD-10-CM | POA: Insufficient documentation

## 2012-07-06 ENCOUNTER — Encounter
Payer: PRIVATE HEALTH INSURANCE | Attending: Physical Medicine & Rehabilitation | Admitting: Physical Medicine & Rehabilitation

## 2012-07-06 ENCOUNTER — Encounter: Payer: Self-pay | Admitting: Physical Medicine & Rehabilitation

## 2012-07-06 VITALS — BP 147/81 | HR 71 | Resp 16 | Ht 65.0 in | Wt 194.0 lb

## 2012-07-06 DIAGNOSIS — S065XAA Traumatic subdural hemorrhage with loss of consciousness status unknown, initial encounter: Secondary | ICD-10-CM

## 2012-07-06 DIAGNOSIS — E119 Type 2 diabetes mellitus without complications: Secondary | ICD-10-CM | POA: Insufficient documentation

## 2012-07-06 DIAGNOSIS — S065X9A Traumatic subdural hemorrhage with loss of consciousness of unspecified duration, initial encounter: Secondary | ICD-10-CM

## 2012-07-06 DIAGNOSIS — I6200 Nontraumatic subdural hemorrhage, unspecified: Secondary | ICD-10-CM | POA: Insufficient documentation

## 2012-07-06 DIAGNOSIS — I739 Peripheral vascular disease, unspecified: Secondary | ICD-10-CM | POA: Insufficient documentation

## 2012-07-06 LAB — CBC
HCT: 40.2 % (ref 36.0–46.0)
Hemoglobin: 13.6 g/dL (ref 12.0–15.0)
MCHC: 33.8 g/dL (ref 30.0–36.0)
RBC: 4.58 MIL/uL (ref 3.87–5.11)
WBC: 7.2 10*3/uL (ref 4.0–10.5)

## 2012-07-06 NOTE — Patient Instructions (Addendum)
STOP THE TOPAMAX  IF YOU HAVE NO PROBLEMS AFTER ONE WEEK OFF TOPAMAX ,YOU MAY THEN CUT THE ATIVAN IN HALF FOR ONE WEEK THEN STOP.

## 2012-07-06 NOTE — Progress Notes (Signed)
Subjective:    Patient ID: Alicia Crawford, female    DOB: [birth year], 56 y.o.   MRN: [id number]  HPI  Mrs. Lynam is back regarding her TBI. Her biggest complaint is fatigue today. She feels that she is sleeping farily well (although she does awaken to void frequently at night). She does take a nap during the day for about an hour or so. She awakens fairly refreshed most days but doesn't have the energy or drive to do much. She denies depression and feels she's doing well with her mood currently.  She goes to the [facility] to swim. She is trying to walk as well, but she's not happy with her gait. She wants to drive.  She has persistent leg pain, and was found to have PAD by angio earlier this year.  i don't have details of this exam however.  Pain Inventory Average Pain 7 Pain Right Now 0 My pain is aching  In the last 24 hours, has pain interfered with the following? General activity 4 Relation with others 7 Enjoyment of life 8 What TIME of day is your pain at its worst? varies Sleep (in general) Fair  Pain is worse with: walking, inactivity, standing and some activites Pain improves with: pacing activities Relief from Meds: n/a  Mobility walk without assistance how many minutes can you walk? 10 ability to climb steps?  yes do you drive?  no Do you have any goals in this area?  yes  Function retired I need assistance with the following:  household duties and shopping Do you have any goals in this area?  yes  Neuro/Psych bladder control problems trouble walking confusion depression anxiety  Prior Studies Any changes since last visit?  no  Physicians involved in your care Any changes since last visit?  no   Family History  Problem Relation Age of Onset  . Heart disease Other   . Diabetes Other   . Hypertension Other   . Alcohol abuse Other   . Mental illness Other   . Heart disease Mother   . Heart disease Father   . Heart disease Sister    History    Social History  . Marital Status: Married    Spouse Name: N/A    Number of Children: N/A  . Years of Education: N/A   Social History Main Topics  . Smoking status: Former Smoker -- 1.0 packs/day for 30 years    Types: Cigarettes    Quit date: 06/22/2005  . Smokeless tobacco: Never Used  . Alcohol Use: Yes     01/17/12 "used to have glass of wine now & then; last wine was ~ 2011"  . Drug Use: No  . Sexually Active: Yes   Other Topics Concern  . None   Social History Narrative  . None   Past Surgical History  Procedure Date  . Ovarian cyst removal 1983  . Stomach and skin tuck 2000  . Posterior fusion lumbar spine ` 2000  . Cholecystectomy ~ 1980's  . 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 Cardiac surgery   . Back surgery   . Craniotomy 02/25/2012    Procedure: CRANIOTOMY HEMATOMA EVACUATION SUBDURAL;  Surgeon: Tia Alert, MD;  Location: [facility];  Service: Neurosurgery;  Laterality: Left;  Left craniotomy for evacuation of subdural hematoma   Past Medical History  Diagnosis Date  . Myocardial infarction   . Hypertension   . Leg pain   .  Anoxic brain injury 08/2010    "w/cardiac arrest"  . Rotator cuff tendonitis   . Complication of anesthesia     [place]  . High cholesterol   . Cardiac arrest 08/04/2010  . Type II diabetes mellitus   . Blood transfusion 09/2010  . Depression   . DEMENTIA   . Coronary artery disease    BP 147/81  Pulse 71  Resp 16  Ht 5\' 5"  (1.651 m)  Wt 194 lb (87.998 kg)  BMI 32.28 kg/m2  SpO2 96%     Review of Systems  Musculoskeletal: Positive for myalgias, arthralgias and gait problem.  Psychiatric/Behavioral: Positive for confusion and dysphoric mood. The patient is nervous/anxious.   All other systems reviewed and are negative.       Objective:   Physical Exam  Constitutional: She appears well-developed and well-nourished.  HENT:  Head: Normocephalic and atraumatic.  Right Ear: External ear  normal.  Left Ear: External ear normal.  Nose: Nose normal.  Mouth/Throat: Oropharynx is clear and moist.  Eyes: Conjunctivae and EOM are normal. Pupils are equal, round, and reactive to light.  Neck: Normal range of motion.  Cardiovascular: Normal rate and regular rhythm.  Pulmonary/Chest: Effort normal and breath sounds normal.  Abdominal: Soft. Bowel sounds are normal.  Neurological: She is alert. She has normal strength. No cranial nerve deficit or sensory deficit.  Very alert today. Cognitively appropriate. Gait is stable But notable for bit of a steppage pattern on the right still. Right ankle DF is 4/5. Elsewhere she is 5/5. Balance intact throughout.  Psychiatric: Her mood appears anxious. Her speech is delayed.  Both legs are warm and have good color.   Assessment & Plan:   1. Frontal subdural hemorrhage. Status post evacuation of hematoma 02/25/2012   -she is doing quite well cognitively  -i gave her permission to drive with supervision and trials with family--day time driving only 2. DVT Prophylaxis/Anticoagulation: ambulating well..  3. Pain Management: tylenol . topamax as prophylaxis for headache.--wean this to off. 4. Persistent Fatigue, likely related to BI, meds, sleep, and perhaps hormonal or vitamin imblance due to her BI  -Instructed her to wean off the ativan and topamax. gaver her written instructions today.  -TSH, T4, cortisol, estrogen, testosterone, vitamin d, CBC were also drawn today  -discussed timing of fluid intake to avoid urinary frequency at night.   5. 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 Patient independent prior to admission- was doing very well.  6. Hypertension with history of bradycardia. Continue low dose lopressor.  7. Diabetes mellitus.Marland Kitchenglucophage-  8. Depression. Zoloft 150 mg daily. this appears improved 9. Follow up with me in 3 months. Asked that she follow up with vascular surgery regarding her PAD and  claudication symptoms.

## 2012-07-07 LAB — TESTOSTERONE: Testosterone: 44.44 ng/dL (ref 10–70)

## 2012-07-07 LAB — CORTISOL-AM, BLOOD: Cortisol - AM: 9.2 ug/dL (ref 4.3–22.4)

## 2012-07-07 LAB — TSH: TSH: 0.03 u[IU]/mL — ABNORMAL LOW (ref 0.350–4.500)

## 2012-07-09 ENCOUNTER — Telehealth: Payer: Self-pay | Admitting: Physical Medicine & Rehabilitation

## 2012-07-09 NOTE — Telephone Encounter (Signed)
Lm advising patient lab work is normal.

## 2012-07-09 NOTE — Telephone Encounter (Signed)
These labs all look ok so far.

## 2012-07-10 LAB — VITAMIN D 1,25 DIHYDROXY
Vitamin D2 1, 25 (OH)2: <8 pg/mL
Vitamin D3 1, 25 (OH)2: 29 pg/mL

## 2012-07-11 ENCOUNTER — Encounter: Payer: Self-pay | Admitting: Vascular Surgery

## 2012-07-12 ENCOUNTER — Ambulatory Visit: Payer: PRIVATE HEALTH INSURANCE | Admitting: Vascular Surgery

## 2012-07-12 LAB — ESTROGENS, TOTAL: Estrogen: 91.6 pg/mL

## 2012-07-23 ENCOUNTER — Ambulatory Visit (HOSPITAL_COMMUNITY)
Admission: RE | Admit: 2012-07-23 | Discharge: 2012-07-23 | Disposition: A | Discharge status: Home / Self Care | Payer: PRIVATE HEALTH INSURANCE | Source: Ambulatory Visit | Attending: Family Medicine | Admitting: Family Medicine

## 2012-07-23 ENCOUNTER — Ambulatory Visit (HOSPITAL_COMMUNITY): Payer: PRIVATE HEALTH INSURANCE

## 2012-07-23 DIAGNOSIS — Z1231 Encounter for screening mammogram for malignant neoplasm of breast: Secondary | ICD-10-CM | POA: Insufficient documentation

## 2012-07-27 ENCOUNTER — Encounter: Payer: Self-pay | Admitting: Surgery

## 2012-07-30 ENCOUNTER — Ambulatory Visit (INDEPENDENT_AMBULATORY_CARE_PROVIDER_SITE_OTHER): Payer: PRIVATE HEALTH INSURANCE | Admitting: Surgery

## 2012-07-30 ENCOUNTER — Encounter (INDEPENDENT_AMBULATORY_CARE_PROVIDER_SITE_OTHER): Payer: PRIVATE HEALTH INSURANCE | Admitting: *Deleted

## 2012-07-30 ENCOUNTER — Encounter: Payer: Self-pay | Admitting: Surgery

## 2012-07-30 VITALS — BP 153/72 | HR 72 | Resp 16 | Ht 65.0 in | Wt 196.0 lb

## 2012-07-30 DIAGNOSIS — I739 Peripheral vascular disease, unspecified: Secondary | ICD-10-CM

## 2012-07-30 DIAGNOSIS — Z01810 Encounter for preprocedural cardiovascular examination: Secondary | ICD-10-CM

## 2012-07-30 DIAGNOSIS — I7092 Chronic total occlusion of artery of the extremities: Secondary | ICD-10-CM

## 2012-07-30 DIAGNOSIS — I6529 Occlusion and stenosis of unspecified carotid artery: Secondary | ICD-10-CM

## 2012-07-30 NOTE — Progress Notes (Signed)
[facility] [place]   Patient name: Alicia Crawford MRN: [id number] DOB: 1956-01-26 Sex: female   Referred by: Dr. Allyson Sabal  Reason for referral:  Chief Complaint  Patient presents with  . Re-evaluation    f/u/ Dr. Allyson Sabal    HISTORY OF PRESENT ILLNESS: The patient is referred today for peripheral vascular disease. She underwent angiography in April for bilateral claudication. 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 She has a history of hypertension and hyperlipidemia which are medically managed. She had a witnessed episode of cardiac arrest with CPR and defibrillation which eventually led to coronary artery bypass grafting in 2011. She was scheduled to see me several months ago however she fell suffered a head bleed which required evacuation.  She is here today to discuss her options. She states that prior to her craniotomy she was able to walk 1.2 miles without stopping. She states that she would occasionally experience an aching in both of her legs. She would also have aching at night. Now, she is walking only one quarter of a mile however this has been limited because of her recent surgery. She is also that the geometry 4 days a week doing leg exercises. She denies having rest pain. She denies any ulcerations. She has previously tried Pletal but did not tolerate 100 mg twice daily.  Past Medical History  Diagnosis Date  . Myocardial infarction   . Hypertension   . Leg pain   . Anoxic brain injury 08/2010    "w/cardiac arrest"  . Rotator cuff tendonitis   . Complication of anesthesia     [place]  . High cholesterol   . Cardiac arrest 08/04/2010  . Type II diabetes mellitus   . Blood transfusion 09/2010  . Depression   . DEMENTIA   . Coronary artery disease   . Peripheral vascular disease   . Stroke   . Irregular heart beat     Past Surgical History  Procedure Date  . Ovarian cyst  removal 1983  . Stomach and skin tuck 2000  . Posterior fusion lumbar spine ` 2000  . Cholecystectomy ~ 1980's  . 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 Cardiac surgery   . Back surgery   . Craniotomy 02/25/2012    Procedure: CRANIOTOMY HEMATOMA EVACUATION SUBDURAL;  Surgeon: Tia Alert, MD;  Location: [facility];  Service: Neurosurgery;  Laterality: Left;  Left craniotomy for evacuation of subdural hematoma    History   Social History  . Marital Status: Married    Spouse Name: N/A    Number of Children: N/A  . Years of Education: N/A   Occupational History  . Not on file.   Social History Main Topics  . Smoking status: Former Smoker -- 1.0 packs/day for 30 years    Types: Cigarettes    Quit date: 06/22/2005  . Smokeless tobacco: Never Used  . Alcohol Use: Yes     01/17/12 "used to have glass of wine now & then; last wine was ~ 2011"  . Drug Use: No  . Sexually Active: Yes   Other Topics Concern  . Not on file   Social History Narrative  . No narrative on file    Family History  Problem Relation Age of Onset  . Heart disease Other   . Diabetes Other   . Hypertension Other   . Alcohol abuse Other   . Mental illness  Other   . Heart disease Mother     before age 65  . Diabetes Mother   . Hyperlipidemia Mother   . Hypertension Mother   . Heart attack Mother   . Heart disease Father   . Hyperlipidemia Father   . Hypertension Father   . Heart attack Father   . Heart disease Sister   . Hyperlipidemia Brother   . Hypertension Brother   . Heart attack Brother     Allergies as of 07/30/2012 - Review Complete 07/30/2012  Allergen Reaction Noted  . [place] Hives and Other (See Comments) 06/22/2011  . Ativan (lorazepam)  03/03/2012    Current Outpatient Prescriptions on File Prior to Visit  Medication Sig Dispense Refill  . atorvastatin (LIPITOR) 80 MG tablet Take 80 mg by mouth daily.      . Coenzyme Q10 (EQL COQ10) 300 MG CAPS Take 1  capsule by mouth daily.      [name] LECITHIN PO Take 1,200 mg by mouth daily.      [name] LORazepam (ATIVAN) 1 MG tablet Take 1 mg by mouth at bedtime.       . metFORMIN (GLUCOPHAGE) 500 MG tablet Take 500 mg by mouth daily.      . metoprolol tartrate (LOPRESSOR) 25 MG tablet Take 1 tablet (25 mg total) by mouth 2 (two) times daily.  60 tablet  1  . Multiple Vitamin (MULITIVITAMIN WITH MINERALS) TABS Take 1 tablet by mouth daily.      . pantoprazole (PROTONIX) 40 MG tablet Take 40 mg by mouth daily.        . sertraline (ZOLOFT) 50 MG tablet Take 150 mg by mouth daily.      [name] topiramate (TOPAMAX) 25 MG tablet Take 1 tablet (25 mg total) by mouth 2 (two) times daily.  60 tablet  1     REVIEW OF SYSTEMS: Cardiovascular: Positive for pain in her legs with walking and lying flat Pulmonary: No productive cough, asthma or wheezing. Neurologic: Positive for leg weakness  Hematologic: No bleeding problems or clotting disorders. Musculoskeletal: No joint pain or joint swelling. Gastrointestinal: No blood in stool or hematemesis Genitourinary: No dysuria or hematuria. Psychiatric:: . Past for anxiety And depression Integumentary: No rashes or ulcers. Constitutional: No fever or chills.  PHYSICAL EXAMINATION: General: The patient appears their stated age.  Vital signs are BP 153/72  Pulse 72  Resp 16  Ht 5\' 5"  (1.651 m)  Wt 196 lb (88.905 kg)  BMI 32.62 kg/m2  SpO2 100% HEENT:  No gross abnormalities Pulmonary: Respirations are non-labored Abdomen: Soft and non-tender healed incision from tummy tuck which got infected Musculoskeletal: There are no major deformities.   Neurologic: No focal weakness or paresthesias are detected, Skin: There are no ulcer or rashes noted. Psychiatric: The patient has normal affect. Cardiovascular: There is a regular rate and rhythm without significant murmur appreciated. No carotid bruits pedal pulses are not palpable  Diagnostic Studies: I have reviewed her  angiogram which shows a high-grade right common femoral stenosis and bilateral superficial femoral artery occlusion.   Medication Changes: She will attempts to try Pletal 50 mg twice daily  Assessment:  Peripheral vascular disease Plan: Based on our discussion today I do not feel that this patient's symptoms warrant revascularization despite severe disease on angiography. Furthermore, with a recent subdural hematoma requiring evacuation I would be reluctant to administerheparin within a 6 month period from her initial craniotomy. I have encouraged her to try 50 mg twice daily of  Pletal to see if she tolerates this and has a benefit. I have also told her to pay more attention to her symptoms and when they occur and how lifestyle limiting they are. I am scheduling her to come back to see me in 6 months. She has never had carotid Dopplers done and therefore I will over them. These would be done for potentially preoperative planning.      Jorge Ny, M.D. [facility] [place] Office: [phone] Pager:  [phone]

## 2012-07-31 ENCOUNTER — Other Ambulatory Visit: Payer: Self-pay | Admitting: *Deleted

## 2012-07-31 DIAGNOSIS — I70219 Atherosclerosis of native arteries of extremities with intermittent claudication, unspecified extremity: Secondary | ICD-10-CM

## 2012-07-31 MED ORDER — CILOSTAZOL 100 MG PO TABS: 100.0000 mg | ORAL_TABLET | Freq: Two times a day (BID) | ORAL | Status: DC

## 2012-07-31 NOTE — Addendum Note (Signed)
Addended by: Sharee Pimple on: 07/31/2012 08:58 AM   Modules accepted: Orders

## 2012-08-17 ENCOUNTER — Other Ambulatory Visit: Payer: Self-pay | Admitting: *Deleted

## 2013-01-03 ENCOUNTER — Other Ambulatory Visit (HOSPITAL_COMMUNITY): Payer: Self-pay | Admitting: Cardiovascular Disease

## 2013-01-03 DIAGNOSIS — I2510 Atherosclerotic heart disease of native coronary artery without angina pectoris: Secondary | ICD-10-CM

## 2013-01-03 DIAGNOSIS — I25810 Atherosclerosis of coronary artery bypass graft(s) without angina pectoris: Secondary | ICD-10-CM

## 2013-01-03 DIAGNOSIS — I70219 Atherosclerosis of native arteries of extremities with intermittent claudication, unspecified extremity: Secondary | ICD-10-CM

## 2013-01-04 ENCOUNTER — Encounter: Payer: Self-pay | Admitting: Physical Medicine & Rehabilitation

## 2013-01-09 ENCOUNTER — Encounter: Payer: Self-pay | Admitting: Surgery

## 2013-01-22 ENCOUNTER — Encounter (HOSPITAL_COMMUNITY): Payer: Self-pay

## 2013-02-04 ENCOUNTER — Ambulatory Visit: Payer: PRIVATE HEALTH INSURANCE | Admitting: Neurosurgery

## 2013-02-04 ENCOUNTER — Other Ambulatory Visit: Payer: PRIVATE HEALTH INSURANCE

## 2013-02-08 ENCOUNTER — Encounter: Payer: Self-pay | Admitting: Surgery

## 2013-02-11 ENCOUNTER — Other Ambulatory Visit (INDEPENDENT_AMBULATORY_CARE_PROVIDER_SITE_OTHER): Payer: Medicare Other | Admitting: *Deleted

## 2013-02-11 ENCOUNTER — Encounter: Payer: Self-pay | Admitting: Surgery

## 2013-02-11 ENCOUNTER — Ambulatory Visit (INDEPENDENT_AMBULATORY_CARE_PROVIDER_SITE_OTHER): Payer: Medicare Other | Admitting: Surgery

## 2013-02-11 DIAGNOSIS — I6529 Occlusion and stenosis of unspecified carotid artery: Secondary | ICD-10-CM

## 2013-02-11 DIAGNOSIS — Z48812 Encounter for surgical aftercare following surgery on the circulatory system: Secondary | ICD-10-CM

## 2013-02-11 NOTE — Addendum Note (Signed)
Addended by: Adria Dill L on: 02/11/2013 03:05 PM   Modules accepted: Orders

## 2013-02-11 NOTE — Progress Notes (Signed)
[facility] [place]   Patient name: Alicia Crawford MRN: [id number] DOB: 06-01-1956 Sex: female     Chief Complaint  Patient presents with  . Carotid    6 month f/u     HISTORY OF PRESENT ILLNESS: The patient is referred today for peripheral vascular disease. She underwent angiography in April for bilateral claudication. 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 She has a history of hypertension and hyperlipidemia which are medically managed. She had a witnessed episode of cardiac arrest with CPR and defibrillation which eventually led to coronary artery bypass grafting in 2011.  She states that prior to her craniotomy she was able to walk 1.2 miles without stopping. She states that she would occasionally experience an aching in both of her legs. She would also have aching at night. She had previously tried Pletal but did not tolerate it. I decreased her dose to 50 mg twice a day. She is tolerating this does but is unsure whether or not it is making a difference. She tells me that she walks 1.5 miles every day. She does have burning in her legs but she can complete this distance. She does not have ulcers or nonhealing wounds. She does not have rest pain.   Past Medical History  Diagnosis Date  . Hypertension   . Leg pain   . Anoxic brain injury 08/2010    "w/cardiac arrest"  . Rotator cuff tendonitis   . Complication of anesthesia     [place]  . High cholesterol   . Cardiac arrest 08/04/2010  . Type II diabetes mellitus   . Blood transfusion 09/2010  . Depression   . DEMENTIA   . Coronary artery disease   . Peripheral vascular disease   . Stroke   . Irregular heart beat   . Carotid artery occlusion   . Obesity   . Myocardial infarction 09/27/10    in [place]  . Anxiety Jan. 2012  . Peripheral arterial disease   . DVT (deep venous thrombosis)   . Hemorrhage May 25,2013   Frontal subdural  S/P evacuation of hematoma  . CAD (coronary artery disease)     Past Surgical History  Procedure Laterality Date  . Ovarian cyst removal  1983  . Stomach and skin tuck  2000  . Posterior fusion lumbar spine  ` 2000  . Cholecystectomy  ~ 1980's  . Coronary artery bypass graft  09/2010    CABG X4  . Cardiac surgery    . Back surgery    . Craniotomy  02/25/2012    Procedure: CRANIOTOMY HEMATOMA EVACUATION SUBDURAL;  Surgeon: Tia Alert, MD;  Location: [facility];  Service: Neurosurgery;  Laterality: Left;  Left craniotomy for evacuation of subdural hematoma    History   Social History  . Marital Status: Married    Spouse Name: N/A    Number of Children: N/A  . Years of Education: N/A   Occupational History  . Not on file.   Social History Main Topics  . Smoking status: Former Smoker -- 1.00 packs/day for 30 years    Types: Cigarettes    Quit date: 06/22/2005  . Smokeless tobacco: Never Used  . Alcohol Use: 0.0 oz/week     Comment: 01/17/12 "used to have glass of wine now & then; last wine was ~ 2011"  . Drug Use: No  . Sexually Active: Yes   Other Topics Concern  .  Not on file   Social History Narrative  . No narrative on file    Family History  Problem Relation Age of Onset  . Heart disease Other   . Diabetes Other   . Hypertension Other   . Alcohol abuse Other   . Mental illness Other   . Heart disease Mother     before age 65  . Diabetes Mother   . Hyperlipidemia Mother   . Hypertension Mother   . Heart attack Mother   . Heart disease Father   . Hyperlipidemia Father   . Hypertension Father   . Heart attack Father   . Heart disease Sister   . Hyperlipidemia Brother   . Hypertension Brother   . Heart attack Brother     Allergies as of 02/11/2013 - Review Complete 02/11/2013  Allergen Reaction Noted  . Halothane Hives and Other (See Comments) 06/22/2011  . Ativan (lorazepam)  03/03/2012    Current Outpatient Prescriptions  on File Prior to Visit  Medication Sig Dispense Refill  . atorvastatin (LIPITOR) 80 MG tablet Take 80 mg by mouth daily.      . cilostazol (PLETAL) 100 MG tablet Take 1 tablet (100 mg total) by mouth 2 (two) times daily before a meal.  60 tablet  11  . Coenzyme Q10 (EQL COQ10) 300 MG CAPS Take 1 capsule by mouth daily.      [name] LECITHIN PO Take 1,200 mg by mouth daily.      [name] LORazepam (ATIVAN) 1 MG tablet Take 1 mg by mouth at bedtime.       [name] losartan (COZAAR) 50 MG tablet Take 50 mg by mouth daily.      . metFORMIN (GLUCOPHAGE) 500 MG tablet Take 500 mg by mouth daily.      . metoprolol tartrate (LOPRESSOR) 25 MG tablet Take 1 tablet (25 mg total) by mouth 2 (two) times daily.  60 tablet  1  . Multiple Vitamin (MULITIVITAMIN WITH MINERALS) TABS Take 1 tablet by mouth daily.      . pantoprazole (PROTONIX) 40 MG tablet Take 40 mg by mouth daily.        . sertraline (ZOLOFT) 50 MG tablet Take 150 mg by mouth daily.      [name] topiramate (TOPAMAX) 25 MG tablet Take 1 tablet (25 mg total) by mouth 2 (two) times daily.  60 tablet  1   No current facility-administered medications on file prior to visit.     REVIEW OF SYSTEMS: Please see history of present illness, otherwise all systems negative   PHYSICAL EXAMINATION:   Vital signs are BP 143/75  Pulse 87  Ht 5\' 5"  (1.651 m)  Wt 195 lb (88.451 kg)  BMI 32.45 kg/m2  SpO2 100% General: The patient appears their stated age. HEENT:  No gross abnormalities Pulmonary:  Non labored breathing Musculoskeletal: There are no major deformities. Neurologic: No focal weakness or paresthesias are detected, Skin: There are no ulcer or rashes noted. Psychiatric: The patient has normal affect. Cardiovascular: There is a regular rate and rhythm without significant murmur appreciated. Pedal pulses are not palpable   Diagnostic Studies Carotid Doppler study has been reviewed. This shows 40-59% stenosis bilaterally.  Assessment: Peripheral vascular  disease, bilateral Plan: I had a lengthy conversation today with the patient regarding the pathophysiology of peripheral vascular disease involving the lower extremity. I do not feel that her symptoms are severe enough to warrant intervention at this time. We discussed what to watch out for, specifically  ulcerations, rest pain, and nonhealing wounds. Should she develop any of these she will contact me. She will also contact me if she notices a significant change in her symptoms. Otherwise, I'll see her back in one year  V. Charlena Cross, M.D. [facility] [place] Office: [phone] Pager:  [phone]

## 2013-02-12 ENCOUNTER — Ambulatory Visit (HOSPITAL_COMMUNITY)
Admission: RE | Admit: 2013-02-12 | Discharge: 2013-02-12 | Disposition: A | Discharge status: Home / Self Care | Payer: Medicare Other | Source: Ambulatory Visit | Attending: Cardiovascular Disease | Admitting: Cardiovascular Disease

## 2013-02-12 ENCOUNTER — Ambulatory Visit: Payer: PRIVATE HEALTH INSURANCE | Admitting: Neurosurgery

## 2013-02-12 ENCOUNTER — Other Ambulatory Visit: Payer: PRIVATE HEALTH INSURANCE

## 2013-02-12 DIAGNOSIS — I25810 Atherosclerosis of coronary artery bypass graft(s) without angina pectoris: Secondary | ICD-10-CM

## 2013-02-12 DIAGNOSIS — R9439 Abnormal result of other cardiovascular function study: Secondary | ICD-10-CM | POA: Insufficient documentation

## 2013-02-12 DIAGNOSIS — E669 Obesity, unspecified: Secondary | ICD-10-CM | POA: Insufficient documentation

## 2013-02-12 DIAGNOSIS — R110 Nausea: Secondary | ICD-10-CM | POA: Insufficient documentation

## 2013-02-12 DIAGNOSIS — I4949 Other premature depolarization: Secondary | ICD-10-CM | POA: Insufficient documentation

## 2013-02-12 DIAGNOSIS — R0989 Other specified symptoms and signs involving the circulatory and respiratory systems: Secondary | ICD-10-CM | POA: Insufficient documentation

## 2013-02-12 DIAGNOSIS — I959 Hypotension, unspecified: Secondary | ICD-10-CM | POA: Insufficient documentation

## 2013-02-12 DIAGNOSIS — I70219 Atherosclerosis of native arteries of extremities with intermittent claudication, unspecified extremity: Secondary | ICD-10-CM | POA: Insufficient documentation

## 2013-02-12 DIAGNOSIS — E119 Type 2 diabetes mellitus without complications: Secondary | ICD-10-CM | POA: Insufficient documentation

## 2013-02-12 DIAGNOSIS — R0609 Other forms of dyspnea: Secondary | ICD-10-CM | POA: Insufficient documentation

## 2013-02-12 DIAGNOSIS — I2510 Atherosclerotic heart disease of native coronary artery without angina pectoris: Secondary | ICD-10-CM

## 2013-02-12 DIAGNOSIS — R42 Dizziness and giddiness: Secondary | ICD-10-CM | POA: Insufficient documentation

## 2013-02-12 DIAGNOSIS — I10 Essential (primary) hypertension: Secondary | ICD-10-CM | POA: Insufficient documentation

## 2013-02-12 DIAGNOSIS — Z8673 Personal history of transient ischemic attack (TIA), and cerebral infarction without residual deficits: Secondary | ICD-10-CM | POA: Insufficient documentation

## 2013-02-12 DIAGNOSIS — I252 Old myocardial infarction: Secondary | ICD-10-CM | POA: Insufficient documentation

## 2013-02-12 MED ORDER — REGADENOSON 0.4 MG/5ML IV SOLN
0.4000 mg | Freq: Once | INTRAVENOUS | Status: AC
  Administered 2013-02-12: 0.4 mg via INTRAVENOUS

## 2013-02-12 MED ORDER — TECHNETIUM TC 99M SESTAMIBI GENERIC - CARDIOLITE
11.0000 | Freq: Once | INTRAVENOUS | Status: AC | PRN
  Administered 2013-02-12: 11 via INTRAVENOUS

## 2013-02-12 MED ORDER — AMINOPHYLLINE 25 MG/ML IV SOLN
150.0000 mg | Freq: Once | INTRAVENOUS | Status: AC
  Administered 2013-02-12: 150 mg via INTRAVENOUS

## 2013-02-12 MED ORDER — TECHNETIUM TC 99M SESTAMIBI GENERIC - CARDIOLITE
31.0000 | Freq: Once | INTRAVENOUS | Status: AC | PRN
  Administered 2013-02-12: 31 via INTRAVENOUS

## 2013-02-12 NOTE — Progress Notes (Signed)
Arterial Lower Ext Duplex Completed. Alicia Crawford D  

## 2013-02-12 NOTE — Procedures (Addendum)
[facility] [address] [place] [address] [phone]  Cardiology Nuclear Med Study  Alicia Crawford is a 57 y.o. female     MRN : [id number]     DOB: [birth year]  Procedure Date: 02/12/2013  Nuclear Med Background Indication for Stress Test:  Graft Patency History:  CAD;MI/CARDIAC ARREST-2011;CABG X4-09/27/2010 Cardiac Risk Factors: Family History - CAD, History of Smoking, Hypertension, Lipids, NIDDM, Obesity, PVD and TIA  Symptoms:  DOE and Fatigue   Nuclear Pre-Procedure Caffeine/Decaff Intake:  10:00pm NPO After: 8:00am   IV Site: R Forearm  IV 0.9% NS with Angio Cath:  22g  Chest Size (in):  N/A IV Started by: Emmit Pomfret, RN  Height: 5\' 5"  (1.651 m)  Cup Size: DD  BMI:  Body mass index is 31.95 kg/(m^2). Weight:  192 lb (87.091 kg)   Tech Comments:  N/A    Nuclear Med Study 1 or 2 day study: 1 day  Stress Test Type:  Lexiscan  Order Authorizing Provider:  Nanetta Batty, MD   Resting Radionuclide: Technetium 31m Sestamibi  Resting Radionuclide Dose: 11.0 mCi   Stress Radionuclide:  Technetium 14m Sestamibi  Stress Radionuclide Dose: 31.0 mCi           Stress Protocol Rest HR: 67 Stress HR: 83  Rest BP: 128/75 Stress BP: 70/58  Exercise Time (min): n/a METS: n/a          Dose of Adenosine (mg):  n/a Dose of Lexiscan: 0.4 mg  Dose of Atropine (mg): n/a Dose of Dobutamine: n/a mcg/kg/min (at max HR)  Stress Test Technologist: Ernestene Mention, CCT Nuclear Technologist: Gonzella Lex, CNMT   Rest Procedure:  Myocardial perfusion imaging was performed at rest 45 minutes following the intravenous administration of Technetium 41m Sestamibi. Stress Procedure:  The patient received IV Lexiscan 0.4 mg over 15-seconds.  Technetium 12m Sestamibi injected at 30-seconds.  Due to patient's nausea, shortness of breath, dizziness and drop in blood pressure, she was given IV Aminophylline 150 mg. Symptoms were  resolved during the nineteen minute recovery period.There were no significant changes with Lexiscan.  Quantitative spect images were obtained after a 45 minute delay.  Transient Ischemic Dilatation (Normal <1.22):  1.05 Lung/Heart Ratio (Normal <0.45):  0.30 QGS EDV:  75 ml QGS ESV:  28 ml LV Ejection Fraction: 63%  Signed by Gonzella Lex, CNMT  Rest ECG: NSR with non-specific ST-T wave changes  Stress ECG: No significant change from baseline ECG, No significant ST segment change suggestive of ischemia. and There are scattered PVCs.  QPS Raw Data Images:  Acquisition technically good; normal left ventricular size. Stress Images:  There is decreased uptake in the inferior wall. There is decreased uptake in the lateral wall. There is a medium sized, mild to moderate intensity fixed perfusion defect in the basal to mid inferior-inferolateral wall. Rest Images:  There is decreased uptake in the inferior wall. There is decreased uptake in the lateral wall. Comparison with the stress images reveals no significant change. Subtraction (SDS):  There is a fixed defect that is most consistent with a previous infarction.  No reversibility is appreciated.  No evidence of ischemia.   Impression Exercise Capacity:  Lexiscan with no exercise. BP Response:  Hypotensive blood pressure response. Clinical Symptoms:  There is dyspnea. Nausea & dizziness ECG Impression:  No significant ECG changes with Lexiscan. Scattered PVCs LV Wall Motion:  Preserved LV Fuction with mild basal inferolateral hypokinesis and abnormal thickening, consistent  with prior infarction.  Comparison with Prior Nuclear Study: No significant change from previous study  Overall Impression:  Abnormal Nuclear Stress test with previously noted inferior-inferolateral infarction. Low risk stress nuclear study  HARDING,DAVID W, MD  02/12/2013 2:22 PM

## 2013-02-12 NOTE — Addendum Note (Signed)
Addended by: Adria Dill L on: 02/12/2013 11:27 AM   Modules accepted: Orders

## 2013-02-13 ENCOUNTER — Encounter: Payer: Self-pay | Admitting: Cardiovascular Disease

## 2013-02-15 ENCOUNTER — Ambulatory Visit: Payer: Medicare Other | Admitting: Vascular Surgery

## 2013-03-10 ENCOUNTER — Encounter: Payer: Self-pay | Admitting: *Deleted

## 2013-04-29 ENCOUNTER — Telehealth (HOSPITAL_COMMUNITY): Payer: Self-pay | Admitting: Cardiovascular Disease

## 2013-04-29 NOTE — Telephone Encounter (Signed)
Patient is almost out of her Atorvastatin 80 mg.  She gets this thru mail order but wants [place] to call [facility] 6 tabs [facility] [address].

## 2013-04-30 ENCOUNTER — Other Ambulatory Visit (HOSPITAL_COMMUNITY): Payer: Self-pay | Admitting: Cardiovascular Disease

## 2013-04-30 DIAGNOSIS — I701 Atherosclerosis of renal artery: Secondary | ICD-10-CM

## 2013-04-30 MED ORDER — ATORVASTATIN CALCIUM 80 MG PO TABS: 80.0000 mg | ORAL_TABLET | Freq: Every day | ORAL | Status: DC

## 2013-04-30 NOTE — Telephone Encounter (Signed)
Returned call.  Pt stated she needs a few pills until refill comes from mail order pharmacy.  Pt informed refill will be sent to [facility].  Pt verbalized understanding and agreed w/ plan.  Pt asked to speak w/ someone in  Billing.  Pt informed she will be transferred and that she will also need to speak w/ Ebony in Scheduling to set up appt for Renal Doppler.  Pt verbalized understanding and agreed w/ plan.  Call given to [facility] in Billing.

## 2013-05-17 ENCOUNTER — Ambulatory Visit (HOSPITAL_COMMUNITY)
Admission: RE | Admit: 2013-05-17 | Discharge: 2013-05-17 | Disposition: A | Discharge status: Home / Self Care | Payer: Medicare Other | Source: Ambulatory Visit | Attending: Internal Medicine | Admitting: Internal Medicine

## 2013-05-17 DIAGNOSIS — I701 Atherosclerosis of renal artery: Secondary | ICD-10-CM | POA: Insufficient documentation

## 2013-05-17 NOTE — Progress Notes (Signed)
Renal Artery Duplex Completed. °Brianna L Mazza,RVT °

## 2013-05-22 ENCOUNTER — Encounter: Payer: Self-pay | Admitting: *Deleted

## 2013-06-06 ENCOUNTER — Ambulatory Visit: Payer: Medicare Other | Admitting: Cardiovascular Disease

## 2013-06-12 ENCOUNTER — Other Ambulatory Visit: Payer: Self-pay | Admitting: Family Medicine

## 2013-06-12 ENCOUNTER — Ambulatory Visit: Payer: Medicare Other | Admitting: Cardiovascular Disease

## 2013-06-12 DIAGNOSIS — R109 Unspecified abdominal pain: Secondary | ICD-10-CM

## 2013-06-13 ENCOUNTER — Ambulatory Visit
Admission: RE | Admit: 2013-06-13 | Discharge: 2013-06-13 | Disposition: A | Discharge status: Home / Self Care | Payer: Medicare Other | Source: Ambulatory Visit | Attending: Family Medicine | Admitting: Family Medicine

## 2013-06-13 ENCOUNTER — Other Ambulatory Visit: Payer: Self-pay | Admitting: Family Medicine

## 2013-06-13 DIAGNOSIS — R102 Pelvic and perineal pain: Secondary | ICD-10-CM

## 2013-06-13 DIAGNOSIS — N200 Calculus of kidney: Secondary | ICD-10-CM

## 2013-06-13 DIAGNOSIS — R109 Unspecified abdominal pain: Secondary | ICD-10-CM

## 2013-07-12 ENCOUNTER — Encounter: Payer: Self-pay | Admitting: Cardiology

## 2013-07-15 ENCOUNTER — Ambulatory Visit (INDEPENDENT_AMBULATORY_CARE_PROVIDER_SITE_OTHER): Payer: Medicare Other | Admitting: Cardiovascular Disease

## 2013-07-15 ENCOUNTER — Encounter: Payer: Self-pay | Admitting: Cardiovascular Disease

## 2013-07-15 VITALS — BP 120/90 | HR 88 | Ht 65.0 in | Wt 197.0 lb

## 2013-07-15 DIAGNOSIS — I2510 Atherosclerotic heart disease of native coronary artery without angina pectoris: Secondary | ICD-10-CM

## 2013-07-15 DIAGNOSIS — E785 Hyperlipidemia, unspecified: Secondary | ICD-10-CM

## 2013-07-15 DIAGNOSIS — I739 Peripheral vascular disease, unspecified: Secondary | ICD-10-CM

## 2013-07-15 DIAGNOSIS — I701 Atherosclerosis of renal artery: Secondary | ICD-10-CM

## 2013-07-15 DIAGNOSIS — I10 Essential (primary) hypertension: Secondary | ICD-10-CM

## 2013-07-15 NOTE — Patient Instructions (Signed)
Your physician wants you to follow-up in: 6 months with an extender and 1 year with Dr Berry. You will receive a reminder letter in the mail two months in advance. If you don't receive a letter, please call our office to schedule the follow-up appointment.  

## 2013-07-15 NOTE — Assessment & Plan Note (Signed)
Patient has claudication. I performed angiography on her 01/17/12 revealing occluded superficial femoral arteries bilaterally. She did have high-grade common femoral artery stenoses right greater than left as well as a 75% right renal artery stenosis. She does complain of lifestyle limiting claudication and was intolerant to Pletal. She saw Dr. Myra Gianotti at my request did not think she was a good candidate currently for surgical revascularization

## 2013-07-15 NOTE — Progress Notes (Signed)
07/15/2013 Alicia Crawford   [birth year]  [id number]  Primary Physician Alicia Carol, MD Primary Cardiologist: Alicia Gess MD Alicia Crawford   HPI:  The patient is a 57 year old, moderately overweight, married Caucasian female, mother of 3 who I last saw in the office 10 months ago. She has a history of remote tobacco abuse having quit 10 years ago, hypertension, hyperlipidemia and a family history of heart disease with both mother and father who had CAD and a brother who had a stent. She had an episode of witnessed cardiac arrest, CPR, defibrillation and ultimately coronary artery bypass grafting in [facility] September 27, 2010. She had prolonged hospitalization on a ventilator and rehab. Echo performed April 2012 showed normal LV systolic function. A Myoview was normal as well. She otherwise is asymptomatic except for lifestyle-limiting claudication. Dopplers performed in our office December 06, 2011, revealed ABIs of 0.46 on the right with an occluded SFA and high-grade distal right common, profunda stenosis, left ABI of 0.62 with an occluded left SFA. I angiogram'd her January 17, 2012, demonstrating occluded SFAs bilaterally with 2-vessel runoff on the right, 1 on the left via peroneal. She did have a 95% calcified ostial profunda femoris stenosis on the right jeopardizing collaterals to her infrapopliteal vessels as well as a 60% distal left common femoral artery stenosis. She also had an incidentally noted 35% right renal artery stenosis. I reviewed the films with Dr. Myra Crawford who felt surgical revascularization was optimal. She in the interim developed a subdural hematoma after falling and hitting her head and her workup was put on hold. Today in the office she continues to complain of lifestyle-limiting claudication. She apparently saw Dr. Myra Crawford who recommended continued medical/conservative therapy. Since I saw her back in the office 6 months ago she's remained completely  asymptomatic. She denies chest pain or shortness of breath. She does complain of lifestyle limiting claudication however.    Current Outpatient Prescriptions  Medication Sig Dispense Refill  . atorvastatin (LIPITOR) 80 MG tablet Take 1 tablet (80 mg total) by mouth daily.  7 tablet  0  . cilostazol (PLETAL) 100 MG tablet Take 1 tablet (100 mg total) by mouth 2 (two) times daily before a meal.  60 tablet  11  . Coenzyme Q10 (EQL COQ10) 300 MG CAPS Take 1 capsule by mouth daily.      [name] LECITHIN PO Take 1,200 mg by mouth daily.      [name] LORazepam (ATIVAN) 1 MG tablet Take 1 mg by mouth at bedtime.       [name] losartan (COZAAR) 50 MG tablet Take 50 mg by mouth daily.      . metFORMIN (GLUCOPHAGE) 500 MG tablet Take 500 mg by mouth 2 (two) times daily with a meal.       . metoprolol tartrate (LOPRESSOR) 25 MG tablet Take 1 tablet (25 mg total) by mouth 2 (two) times daily.  60 tablet  1  . Multiple Vitamin (MULITIVITAMIN WITH MINERALS) TABS Take 1 tablet by mouth daily.      . pantoprazole (PROTONIX) 40 MG tablet Take 40 mg by mouth daily.        . sertraline (ZOLOFT) 50 MG tablet Take 100 mg by mouth 2 (two) times daily.        No current facility-administered medications for this visit.    Allergies  Allergen Reactions  . Halothane Hives and Other (See Comments)    Gas used 1980's Reaction: affected her liver  . Ativan [  Lorazepam]     Family states patient has extreme bradycardia with this med.    History   Social History  . Marital Status: Married    Spouse Name: N/A    Number of Children: N/A  . Years of Education: N/A   Occupational History  . Not on file.   Social History Main Topics  . Smoking status: Former Smoker -- 1.00 packs/day for 30 years    Types: Cigarettes    Quit date: 06/22/2005  . Smokeless tobacco: Never Used  . Alcohol Use: 0.0 oz/week     Comment: 01/17/12 "used to have glass of wine now & then; last wine was ~ 2011"  . Drug Use: No  . Sexual Activity: Yes     Other Topics Concern  . Not on file   Social History Narrative  . No narrative on file     Review of Systems: General: negative for chills, fever, night sweats or weight changes.  Cardiovascular: negative for chest pain, dyspnea on exertion, edema, orthopnea, palpitations, paroxysmal nocturnal dyspnea or shortness of breath Dermatological: negative for rash Respiratory: negative for cough or wheezing Urologic: negative for hematuria Abdominal: negative for nausea, vomiting, diarrhea, bright red blood per rectum, melena, or hematemesis Neurologic: negative for visual changes, syncope, or dizziness All other systems reviewed and are otherwise negative except as noted above.    Blood pressure 120/90, pulse 88, height 5\' 5"  (1.651 m), weight 197 lb (89.359 kg).  General appearance: alert and no distress Neck: no adenopathy, no carotid bruit, no JVD, supple, symmetrical, trachea midline and thyroid not enlarged, symmetric, no tenderness/mass/nodules Lungs: clear to auscultation bilaterally Heart: regular rate and rhythm, S1, S2 normal, no murmur, click, rub or gallop Extremities: extremities normal, atraumatic, no cyanosis or edema  EKG normal sinus rhythm at 85 without ST or T wave changes  ASSESSMENT AND PLAN:   CAD, cardiac arrest, CPR, CABG in [facility] Dec 2011-NL LVF by Echo, neg Nuc April 2013 Status post what with witnessed sudden cardiac arrest and CPR performed a left vagus 09/27/10. She had coronary artery bypass grafting. She had normal LV function. Recent Myoview performed in May of this year showed inferolateral scar without ischemia. She denies chest pain or shortness of breath. Her LV function by 2-D echo is in the 50% range.  Peripheral artery disease, bilta LE PVD by angio April 2013 Patient has claudication. I performed angiography on her 01/17/12 revealing occluded superficial femoral arteries bilaterally. She did have high-grade common femoral artery stenoses  right greater than left as well as a 75% right renal artery stenosis. She does complain of lifestyle limiting claudication and was intolerant to Pletal. She saw Dr. Myra Crawford at my request did not think she was a good candidate currently for surgical revascularization  HTN (hypertension) Well-controlled on current medications.  HLD (hyperlipidemia) On statin therapy with recent lipid profile/11/14 revealing a total cholesterol of 188, LDL 102 HDL of 49  Renal artery stenosis, Right, 75% 75% right renal artery stenosis demonstrated at the time of angiography 01/17/12. We have been following her renal Doppler studies which have remained stable. Her right renal aortic ratio is 3.65.      Alicia Gess MD [facility], [facility] 07/15/2013 9:59 AM

## 2013-07-15 NOTE — Assessment & Plan Note (Signed)
Well-controlled on current medications 

## 2013-07-15 NOTE — Assessment & Plan Note (Signed)
Status post what with witnessed sudden cardiac arrest and CPR performed a left vagus 09/27/10. She had coronary artery bypass grafting. She had normal LV function. Recent Myoview performed in May of this year showed inferolateral scar without ischemia. She denies chest pain or shortness of breath. Her LV function by 2-D echo is in the 50% range.

## 2013-07-15 NOTE — Assessment & Plan Note (Signed)
75% right renal artery stenosis demonstrated at the time of angiography 01/17/12. We have been following her renal Doppler studies which have remained stable. Her right renal aortic ratio is 3.65.

## 2013-07-15 NOTE — Assessment & Plan Note (Signed)
On statin therapy with recent lipid profile/11/14 revealing a total cholesterol of 188, LDL 102 HDL of 49

## 2013-09-13 ENCOUNTER — Other Ambulatory Visit (HOSPITAL_COMMUNITY): Payer: Self-pay | Admitting: Family Medicine

## 2013-09-13 DIAGNOSIS — Z1231 Encounter for screening mammogram for malignant neoplasm of breast: Secondary | ICD-10-CM

## 2013-09-19 IMAGING — CT CT HEAD W/O CM
1 of 2 series · 15 of 30 positions shown, 19 images · non-contrast
Comparison: 02/25/2012

CLINICAL DATA: Follow up subdural hematoma

CT HEAD WITHOUT CONTRAST
TECHNIQUE: Contiguous axial images were obtained from the base of
the skull through the vertex without contrast.

[Series 2: head routine 4.8 h37s · axial · 0.43mm/px · z∈[-110,+45]mm · 15 of 36 slices shown, 19 images]
[im 2/36  brain]
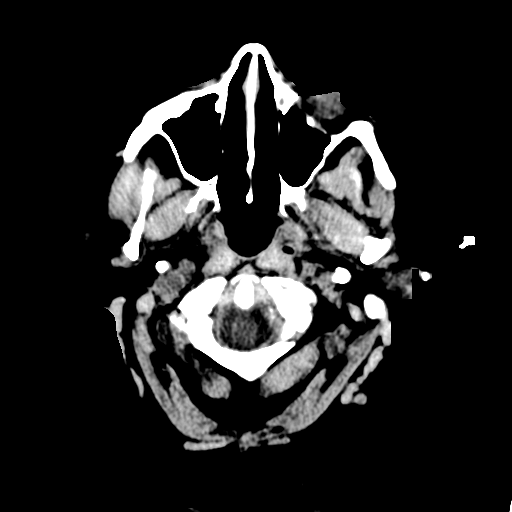
[im 2/36  bone]
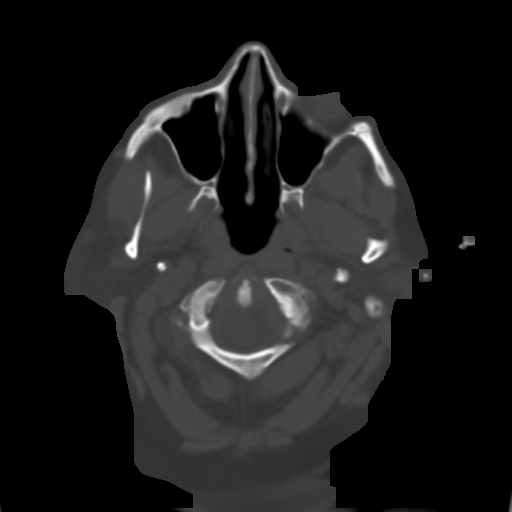
[im 4/36  brain]
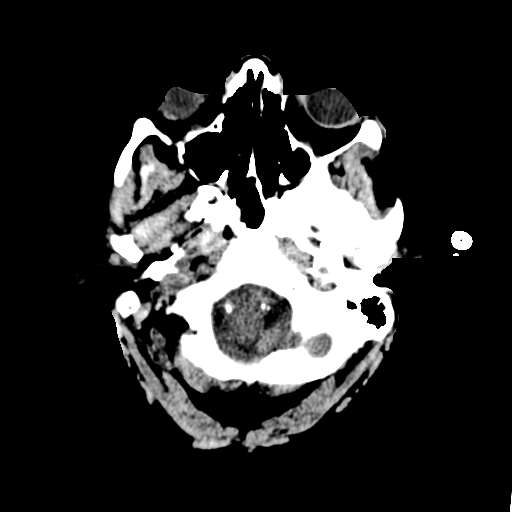
[im 8/36  brain]
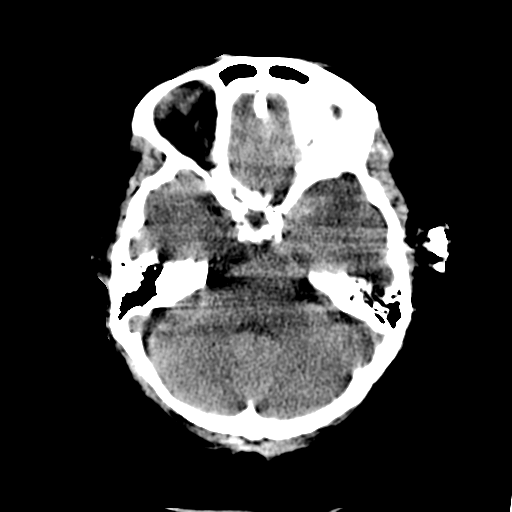
[im 10/36  brain]
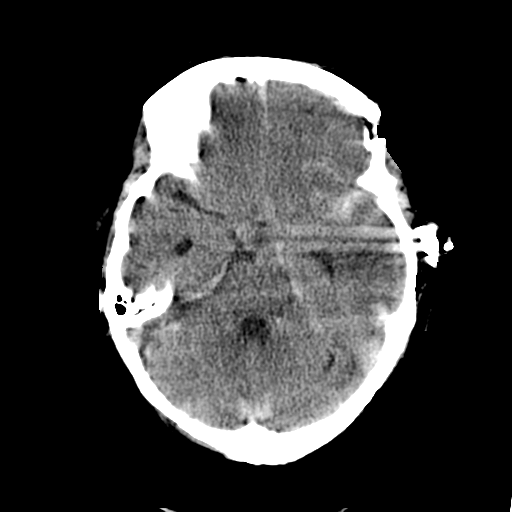
[im 12/36  brain]
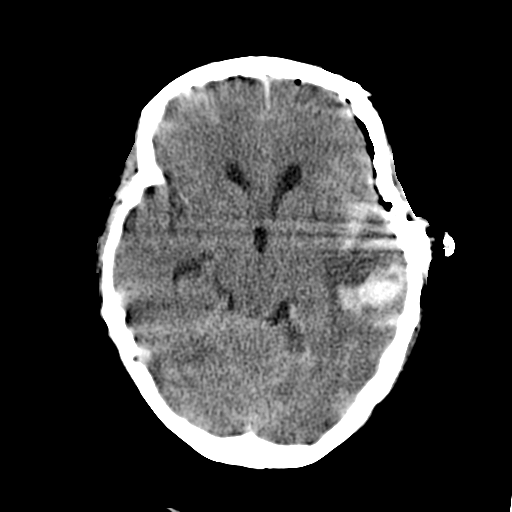
[im 12/36  bone]
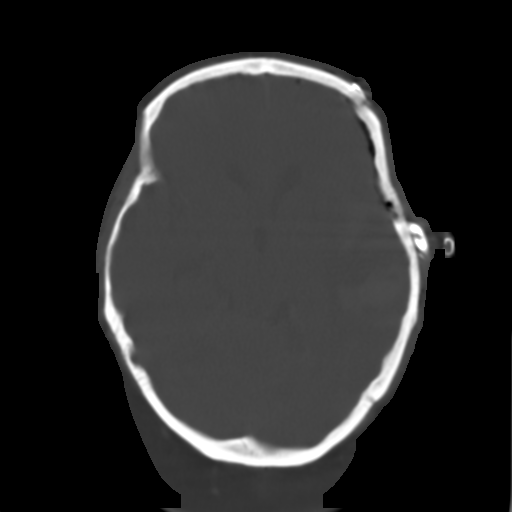
[im 13/36  brain]
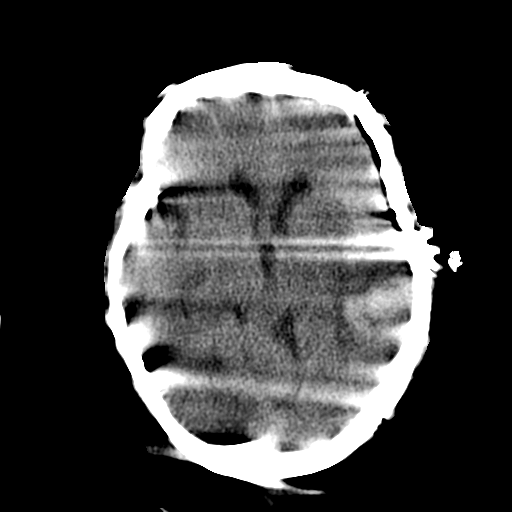
[im 15/36  brain]
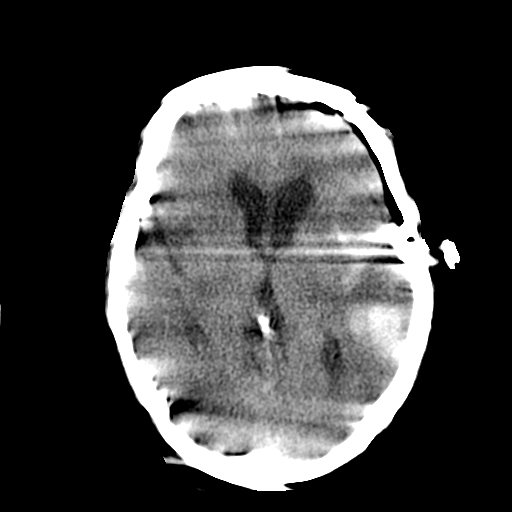
[im 19/36  brain]
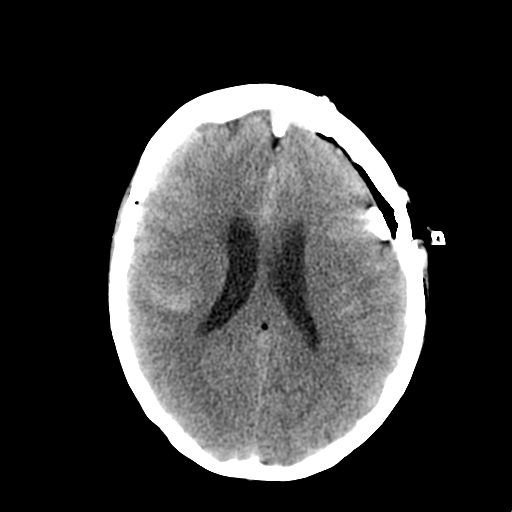
[im 21/36  brain]
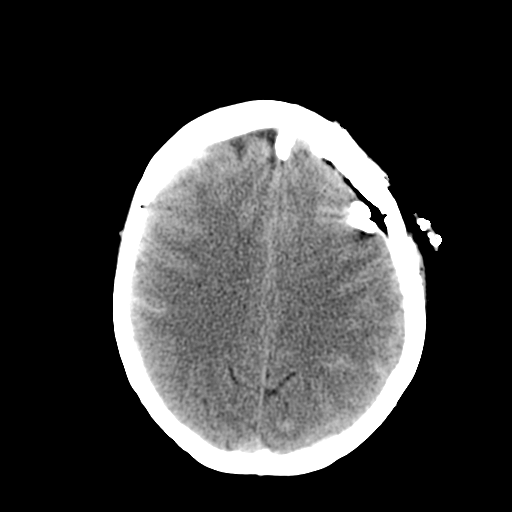
[im 21/36  bone]
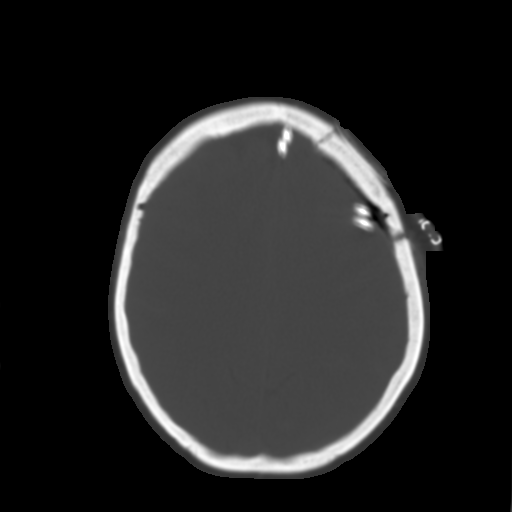
[im 23/36  brain]
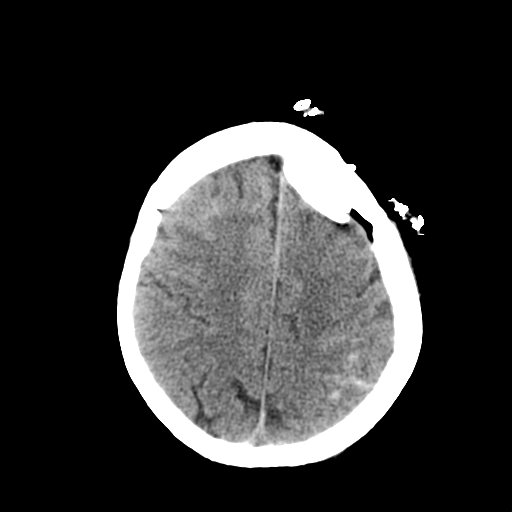
[im 24/36  brain]
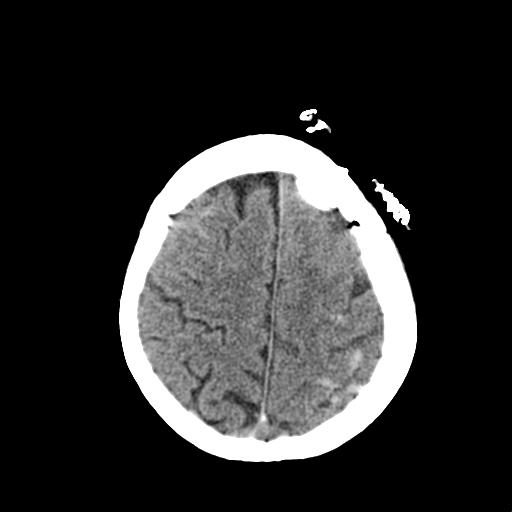
[im 26/36  brain]
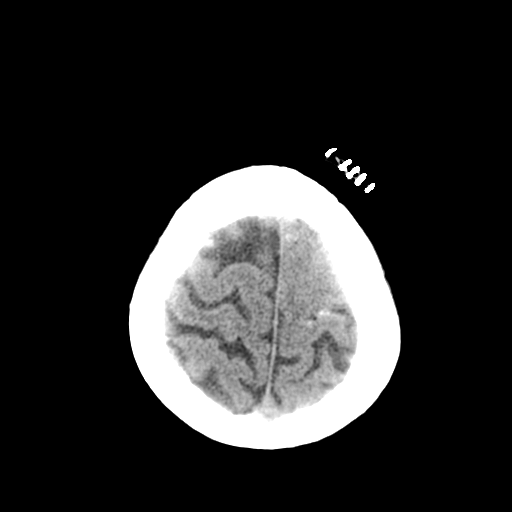
[im 30/36  brain]
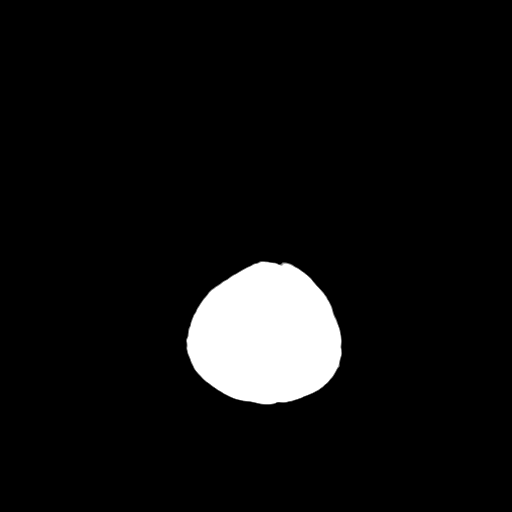
[im 30/36  bone]
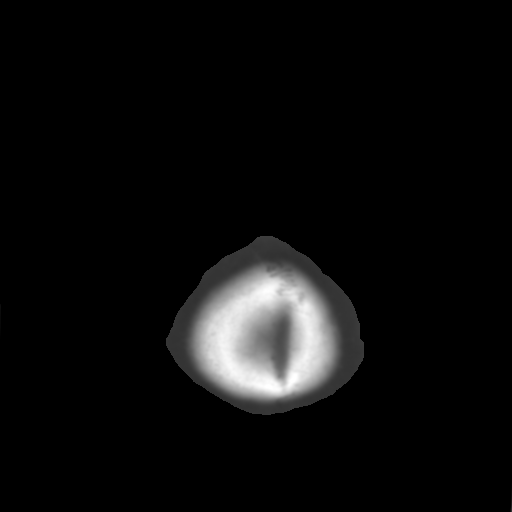
[im 32/36  brain]
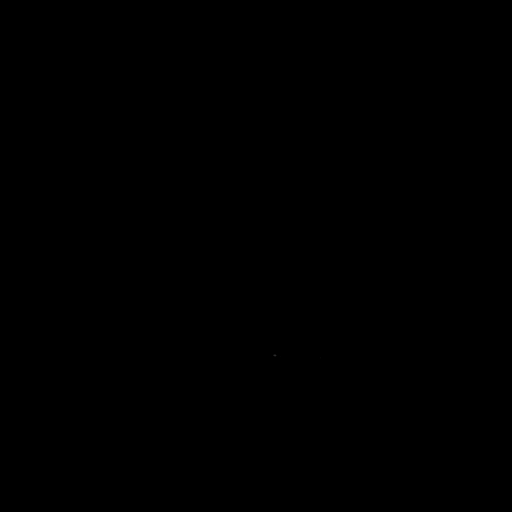
[im 34/36  brain]
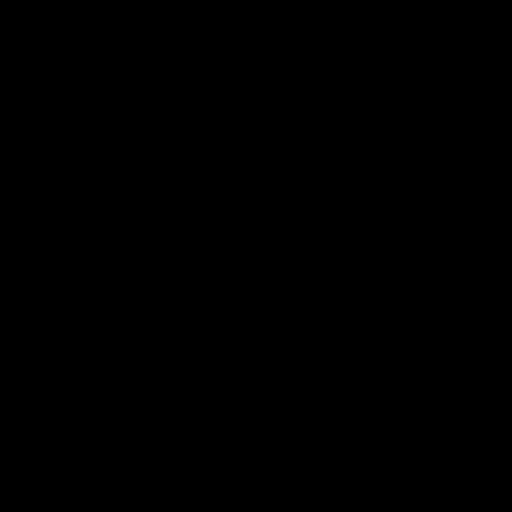

[15 of 30 positions shown; findings below may reference images not displayed]

FINDINGS: Motion degraded images.

Interval left frontal craniotomy with evacuation of subdural
hematoma and placement of a surgical drain.  Associated gas
overlying the left frontal convexity.

1.6 x 2.7 cm parenchymal hemorrhage/contusion in the left temporal
lobe (series 3/image 11), new.  Surrounding vasogenic edema.

New diffuse subarachnoid hemorrhage, most prominent in the left
sylvian fissure.

Small amount of intraventricular hemorrhage is suspected in the
occipital horn of the left lateral ventricle (series 3/image 14).
Ventricles are mildly prominent.

Near complete resolution of prior rightward midline shift, now
measuring 2 mm.

The visualized paranasal sinuses are essentially clear. The mastoid
air cells are unopacified.

No evidence of calvarial fracture.
IMPRESSION: Interval left frontal craniotomy with evacuation of subdural
hematoma and placement of a surgical drain.  Near complete
resolution of prior rightward midline shift, now measuring 2 mm.

1.6 x 2.7 cm parenchymal hemorrhage/contusion in the left temporal
lobe, new.  Surrounding vasogenic edema.

New diffuse subarachnoid hemorrhage.  Suspected small amount of
intraventricular hemorrhage.

These results were called by telephone on 02/26/2012  at  1246
hours to  Kayy Turgoose, the nurse caring for the patient, who
verbally acknowledged these results.

## 2013-09-30 ENCOUNTER — Encounter: Payer: Self-pay | Admitting: Cardiovascular Disease

## 2013-10-04 ENCOUNTER — Ambulatory Visit (HOSPITAL_COMMUNITY): Payer: Medicare Other

## 2013-10-16 ENCOUNTER — Ambulatory Visit (HOSPITAL_COMMUNITY): Payer: Medicare Other

## 2013-10-21 ENCOUNTER — Other Ambulatory Visit: Payer: Self-pay | Admitting: *Deleted

## 2013-10-21 DIAGNOSIS — I70219 Atherosclerosis of native arteries of extremities with intermittent claudication, unspecified extremity: Secondary | ICD-10-CM

## 2013-10-21 MED ORDER — CILOSTAZOL 100 MG PO TABS: 100.0000 mg | ORAL_TABLET | Freq: Two times a day (BID) | ORAL | Status: DC

## 2013-10-21 MED ORDER — ATORVASTATIN CALCIUM 80 MG PO TABS: 80.0000 mg | ORAL_TABLET | Freq: Every day | ORAL | Status: DC

## 2013-11-01 ENCOUNTER — Other Ambulatory Visit (HOSPITAL_COMMUNITY): Payer: Self-pay | Admitting: Cardiovascular Disease

## 2013-11-01 DIAGNOSIS — I701 Atherosclerosis of renal artery: Secondary | ICD-10-CM

## 2013-11-07 ENCOUNTER — Ambulatory Visit (HOSPITAL_COMMUNITY)
Admission: RE | Admit: 2013-11-07 | Discharge: 2013-11-07 | Disposition: A | Discharge status: Home / Self Care | Payer: Medicare Other | Source: Ambulatory Visit | Attending: Cardiovascular Disease | Admitting: Cardiovascular Disease

## 2013-11-07 DIAGNOSIS — I10 Essential (primary) hypertension: Secondary | ICD-10-CM

## 2013-11-07 DIAGNOSIS — I701 Atherosclerosis of renal artery: Secondary | ICD-10-CM | POA: Insufficient documentation

## 2013-11-07 NOTE — Progress Notes (Signed)
Renal Duplex Completed. Kimbree Casanas, BS, RDMS, RVT  

## 2013-11-11 ENCOUNTER — Telehealth: Payer: Self-pay | Admitting: *Deleted

## 2013-11-11 ENCOUNTER — Encounter: Payer: Self-pay | Admitting: *Deleted

## 2013-11-11 DIAGNOSIS — I701 Atherosclerosis of renal artery: Secondary | ICD-10-CM

## 2013-11-11 NOTE — Telephone Encounter (Signed)
Order placed for repeat renal dopplers in 6 months  

## 2013-11-11 NOTE — Telephone Encounter (Signed)
Message copied by Chauncy Lean on Mon Nov 11, 2013  7:53 PM ------      Message from: Lorretta Harp      Created: Sun Nov 10, 2013  5:46 PM       No change from prior study. Repeat in 6 months ------

## 2013-11-20 ENCOUNTER — Other Ambulatory Visit: Payer: Self-pay | Admitting: Surgery

## 2013-11-20 ENCOUNTER — Ambulatory Visit (HOSPITAL_COMMUNITY): Payer: Medicare Other

## 2013-11-20 DIAGNOSIS — Z48812 Encounter for surgical aftercare following surgery on the circulatory system: Secondary | ICD-10-CM

## 2013-11-20 DIAGNOSIS — I739 Peripheral vascular disease, unspecified: Secondary | ICD-10-CM

## 2013-11-20 DIAGNOSIS — I6529 Occlusion and stenosis of unspecified carotid artery: Secondary | ICD-10-CM

## 2013-11-28 ENCOUNTER — Ambulatory Visit (HOSPITAL_COMMUNITY): Payer: Medicare Other

## 2013-12-11 ENCOUNTER — Ambulatory Visit (HOSPITAL_COMMUNITY)
Admission: RE | Admit: 2013-12-11 | Discharge: 2013-12-11 | Disposition: A | Discharge status: Home / Self Care | Payer: Medicare Other | Source: Ambulatory Visit | Attending: Family Medicine | Admitting: Family Medicine

## 2013-12-11 DIAGNOSIS — Z1231 Encounter for screening mammogram for malignant neoplasm of breast: Secondary | ICD-10-CM

## 2014-02-14 ENCOUNTER — Encounter: Payer: Self-pay | Admitting: Family

## 2014-02-17 ENCOUNTER — Encounter (HOSPITAL_COMMUNITY): Payer: Medicare Other

## 2014-02-17 ENCOUNTER — Other Ambulatory Visit (HOSPITAL_COMMUNITY): Payer: Medicare Other

## 2014-02-17 ENCOUNTER — Ambulatory Visit: Payer: Medicare Other | Admitting: Surgery

## 2014-02-17 ENCOUNTER — Ambulatory Visit: Payer: Medicare Other | Admitting: Family

## 2014-02-27 ENCOUNTER — Telehealth: Payer: Self-pay | Admitting: Cardiovascular Disease

## 2014-02-27 MED ORDER — PANTOPRAZOLE SODIUM 40 MG PO TBEC: 40.0000 mg | DELAYED_RELEASE_TABLET | Freq: Every day | ORAL | Status: DC

## 2014-02-27 MED ORDER — METOPROLOL TARTRATE 25 MG PO TABS: 25.0000 mg | ORAL_TABLET | Freq: Two times a day (BID) | ORAL | Status: DC

## 2014-02-27 NOTE — Telephone Encounter (Signed)
Needs her Metoprolol Tart 25 mg and Pantoprazole 40 mgs called to [facility]  Please call

## 2014-02-27 NOTE — Telephone Encounter (Signed)
Rx refills sent into patient pharmacy. Patient notified

## 2014-03-13 ENCOUNTER — Encounter: Payer: Self-pay | Admitting: Family

## 2014-03-14 ENCOUNTER — Ambulatory Visit (INDEPENDENT_AMBULATORY_CARE_PROVIDER_SITE_OTHER): Payer: Medicare Other | Admitting: Family

## 2014-03-14 ENCOUNTER — Ambulatory Visit (INDEPENDENT_AMBULATORY_CARE_PROVIDER_SITE_OTHER)
Admission: RE | Admit: 2014-03-14 | Discharge: 2014-03-14 | Disposition: A | Discharge status: Home / Self Care | Payer: Medicare Other | Source: Ambulatory Visit | Attending: Surgery | Admitting: Surgery

## 2014-03-14 ENCOUNTER — Ambulatory Visit (HOSPITAL_COMMUNITY)
Admission: RE | Admit: 2014-03-14 | Discharge: 2014-03-14 | Disposition: A | Discharge status: Home / Self Care | Payer: Medicare Other | Source: Ambulatory Visit | Attending: Family | Admitting: Family

## 2014-03-14 ENCOUNTER — Encounter: Payer: Self-pay | Admitting: Family

## 2014-03-14 VITALS — BP 125/74 | HR 73 | Resp 16 | Ht 65.0 in | Wt 191.0 lb

## 2014-03-14 DIAGNOSIS — I739 Peripheral vascular disease, unspecified: Secondary | ICD-10-CM

## 2014-03-14 DIAGNOSIS — Z48812 Encounter for surgical aftercare following surgery on the circulatory system: Secondary | ICD-10-CM

## 2014-03-14 DIAGNOSIS — I6529 Occlusion and stenosis of unspecified carotid artery: Secondary | ICD-10-CM

## 2014-03-14 NOTE — Progress Notes (Signed)
Established Carotid Patient   History of Present Illness  Alicia Crawford is a 57 y.o. female patient of Dr. [name] who is s/p angiography in April, 2014 for bilateral claudication. 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 She also has mild/moderate carotid artery disease. She returns today for follow up. She has a history of hypertension and hyperlipidemia which are medically managed. She had a witnessed episode of cardiac arrest with CPR and defibrillation which eventually led to coronary artery bypass grafting in 2011. During this hospitalization she was told that she had already had a stroke before this, but she does not recall any symptoms of ever having a stroke. She has had mild expressive aphasia since the cardiac arrest. She denies any monocular loss of vision. In 2013 she fell, hit her head, and sustained an intracranial bleed. She states that prior to her craniotomy she was able to walk 1.2 miles without stopping. She states that she would occasionally experience an aching in both of her legs. She would also have aching at night. She had previously tried Pletal but did not tolerate it. Dr. [name] decreased her dose to 50 mg twice a day. She is tolerating this does but is unsure whether or not it is making a difference. She walks 1.5 miles every day. She does have burning in her legs but she can complete this distance. She does not have ulcers or nonhealing wounds. She does not have rest pain.   Patient has not had previous carotid artery intervention.  She walks daily, her legs hurt after walking on an incline for about 1/4 mile, can walk a mile on flat ground before bilateral thighs and calves pain start. She also works out at a gym 4 days/week. Pt denies ever having DVT. She reports that she has a history of MRSA connected with a tummy tuck.   Pt denies  New Medical or Surgical History.  Pt Diabetic: Yes, states in good control Pt smoker: former smoker, quit 2008  Pt meds include: Statin : Yes ASA: No Other anticoagulants/antiplatelets: cilostezol    Past Medical History  Diagnosis Date  . Hypertension   . Claudication   . Anoxic brain injury 08/2010    "w/cardiac arrest"  . Rotator cuff tendonitis   . Complication of anesthesia     [place]  . High cholesterol   . Cardiac arrest 08/04/2010  . Type II diabetes mellitus   . Blood transfusion 09/2010  . Depression   . DEMENTIA   . Coronary artery disease     CABG 2011 in [place]  . Peripheral vascular disease     bilat SFA diseae  . Stroke   . Carotid artery occlusion   . Obesity   . Myocardial infarction 09/27/10    in [place]  . Anxiety Jan. 2012  . DVT (deep venous thrombosis)   . Subdural hematoma May 25,2013    Frontal subdural  S/P evacuation of hematoma    Social History History  Substance Use Topics  . Smoking status: Former Smoker -- 1.00 packs/day for 30 years    Types: Cigarettes    Quit date: 06/22/2005  . Smokeless tobacco: Never Used  . Alcohol Use: 0.0 oz/week     Comment: 01/17/12 "used to have glass of wine now & then; last wine was ~ 2011"    Family History Family History  Problem Relation Age of Onset  .  Heart disease Other   . Diabetes Other   . Hypertension Other   . Alcohol abuse Other   . Mental illness Other   . Heart disease Mother     before age 26  . Diabetes Mother   . Hyperlipidemia Mother   . Hypertension Mother   . Heart attack Mother   . Heart disease Father   . Hyperlipidemia Father   . Hypertension Father   . Heart attack Father   . Heart disease Sister   . Hyperlipidemia Brother   . Hypertension Brother   . Heart attack Brother     Surgical History Past Surgical History  Procedure Laterality Date  . Ovarian cyst removal  1983  . Stomach and skin tuck  2000  . Posterior fusion lumbar spine  `  2000  . Cholecystectomy  ~ 1980's  . Coronary artery bypass graft  09/2010    CABG X4  . Back surgery    . Craniotomy  02/25/2012    Procedure: CRANIOTOMY HEMATOMA EVACUATION SUBDURAL;  Surgeon: Eustace Moore, MD;  Location: [facility];  Service: Neurosurgery;  Laterality: Left;  Left craniotomy for evacuation of subdural hematoma  . Lower extremity angiogram  April 2013    Allergies  Allergen Reactions  . [place] Hives and Other (See Comments)    Gas used 1980's Reaction: affected her liver  . Ativan [Lorazepam]     Family states patient has extreme bradycardia with this med.    Current Outpatient Prescriptions  Medication Sig Dispense Refill  . atorvastatin (LIPITOR) 80 MG tablet Take 1 tablet (80 mg total) by mouth daily.  90 tablet  2  . cilostazol (PLETAL) 100 MG tablet Take 1 tablet (100 mg total) by mouth 2 (two) times daily before a meal.  180 tablet  2  . Coenzyme Q10 (EQL COQ10) 300 MG CAPS Take 1 capsule by mouth daily.      [name] LECITHIN PO Take 1,200 mg by mouth daily.      [name] LORazepam (ATIVAN) 1 MG tablet Take 1 mg by mouth at bedtime.       [name] losartan (COZAAR) 50 MG tablet Take 50 mg by mouth daily.      . metFORMIN (GLUCOPHAGE) 500 MG tablet Take 500 mg by mouth 2 (two) times daily with a meal.       . metoprolol tartrate (LOPRESSOR) 25 MG tablet Take 1 tablet (25 mg total) by mouth 2 (two) times daily.  180 tablet  1  . Multiple Vitamin (MULITIVITAMIN WITH MINERALS) TABS Take 1 tablet by mouth daily.      . pantoprazole (PROTONIX) 40 MG tablet Take 1 tablet (40 mg total) by mouth daily.  90 tablet  1  . sertraline (ZOLOFT) 50 MG tablet Take 100 mg by mouth 2 (two) times daily.        No current facility-administered medications for this visit.    Review of Systems : See HPI for pertinent positives and negatives.  Physical Examination  Filed Vitals:   03/14/14 1517 03/14/14 1521  BP: 134/89 125/74  Pulse: 70 73  Resp:  16  Height:  5\' 5"  (1.651 m)   Weight:  191 lb (86.637 kg)  SpO2:  99%   Body mass index is 31.78 kg/(m^2).  General: WDWN obese female in NAD GAIT: normal Eyes: PERRLA Pulmonary:  Non-labored, CTAB, Negative  Rales, Negative rhonchi, & Negative wheezing.  Cardiac: regular Rhythm ,  Negative detected murmur.  VASCULAR EXAM Carotid  Bruits Left Right   Negative Negative    Aorta is not palpable. Radial pulses are 2+ palpable and equal.                                                                                                                            LE Pulses LEFT RIGHT       FEMORAL  2+ palpable  2+ palpable        POPLITEAL  not palpable   not palpable       POSTERIOR TIBIAL  not palpable   not palpable        DORSALIS PEDIS      ANTERIOR TIBIAL 1+ palpable  2+ palpable     Gastrointestinal: soft, nontender, BS WNL, no r/g,  negative masses.  Musculoskeletal: Negative muscle atrophy/wasting. M/S 5/5 throughout, Extremities without ischemic changes.  Neurologic: A&O X 3; Appropriate Affect ; SENSATION ;normal;  Speech is normal, CN 2-12 intact, Pain and light touch intact in extremities, Motor exam as listed above.   Non-Invasive Vascular Imaging CAROTID DUPLEX 03/14/2014   CEREBROVASCULAR DUPLEX EVALUATION    INDICATION: Carotid artery disease     PREVIOUS INTERVENTION(S):     DUPLEX EXAM:     RIGHT  LEFT  Peak Systolic Velocities (cm/s) End Diastolic Velocities (cm/s) Plaque LOCATION Peak Systolic Velocities (cm/s) End Diastolic Velocities (cm/s) Plaque  86 19  CCA PROXIMAL 86 26 HT  50 17  CCA MID 119 35 HT  93 29 HT CCA DISTAL 97 32 HT  240 26 CP ECA 430 39 CP  103 35 CP ICA PROXIMAL 165 58 CP  101 28  ICA MID 114 31   86 29  ICA DISTAL 41 18     2.06 ICA / CCA Ratio (PSV) 1.38  Antegrade  Vertebral Flow Antegrade   998 Brachial Systolic Pressure (mmHg) 338  Triphasic  Brachial Artery Waveforms Triphasic     Plaque Morphology:  HM = Homogeneous, HT = Heterogeneous, CP =  Calcific Plaque, SP = Smooth Plaque, IP = Irregular Plaque     ADDITIONAL FINDINGS:     IMPRESSION: Right internal carotid artery velocities suggest a <40% stenosis (high end of range). Left internal carotid artery velocities suggest a 40-59% stenosis.     Compared to the previous exam:  No significant change in comparison to the last exam on    ABI's: Right: 0.70 with monophasic waveforms; Left: 0.85 with biphasic waveforms Previous ABI's: Right: 0.49, Left: 0.84   Assessment: Alicia Crawford is a 58 y.o. female who presents with asymptomatic minimal right ICA stenosis and mild/moderate left ICA stenosis. The  ICA stenosis is  Unchanged from previous exam. ABI improved in right leg (moderate arterial occlusive disease), stable in left leg (mild arterial occlusive disease) with considerable walking and exercise program. She has claudication in both thighs and calves after walking on flat surface for about a mile, no non healing wounds.  Plan: Follow-up in  1 year with Carotid Duplex scan and ABI's. Continue extensive walking and exercise program.  I discussed in depth with the patient the nature of atherosclerosis, and emphasized the importance of maximal medical management including strict control of blood pressure, blood glucose, and lipid levels, obtaining regular exercise, and continued cessation of smoking.  The patient is aware that without maximal medical management the underlying atherosclerotic disease process will progress, limiting the benefit of any interventions. The patient was given information about stroke prevention and PAD and what symptoms should prompt the patient to seek immediate medical care. Thank you for allowing [place] to participate in this patient's care.  Clemon Chambers, RN, MSN, FNP-C [facility] Office: [phone]  Clinic Physician: Bridgett Larsson  03/14/2014 2:45 PM

## 2014-03-14 NOTE — Patient Instructions (Signed)
Stroke Prevention Some medical conditions and behaviors are associated with an increased chance of having a stroke. You may prevent a stroke by making healthy choices and managing medical conditions. HOW CAN I REDUCE MY RISK OF HAVING A STROKE?   Stay physically active. Get at least 30 minutes of activity on most or all days.  Do not smoke. It may also be helpful to avoid exposure to secondhand smoke.  Limit alcohol use. Moderate alcohol use is considered to be:  No more than 2 drinks per day for men.  No more than 1 drink per day for nonpregnant women.  Eat healthy foods. This involves  Eating 5 or more servings of fruits and vegetables a day.  Following a diet that addresses high blood pressure (hypertension), high cholesterol, diabetes, or obesity.  Manage your cholesterol levels.  A diet low in saturated fat, trans fat, and cholesterol and high in fiber may control cholesterol levels.  Take any prescribed medicines to control cholesterol as directed by your health care provider.  Manage your diabetes.  A controlled-carbohydrate, controlled-sugar diet is recommended to manage diabetes.  Take any prescribed medicines to control diabetes as directed by your health care provider.  Control your hypertension.  A low-salt (sodium), low-saturated fat, low-trans fat, and low-cholesterol diet is recommended to manage hypertension.  Take any prescribed medicines to control hypertension as directed by your health care provider.  Maintain a healthy weight.  A reduced-calorie, low-sodium, low-saturated fat, low-trans fat, low-cholesterol diet is recommended to manage weight.  Stop drug abuse.  Avoid taking birth control pills.  Talk to your health care provider about the risks of taking birth control pills if you are over 35 years old, smoke, get migraines, or have ever had a blood clot.  Get evaluated for sleep disorders (sleep apnea).  Talk to your health care provider about  getting a sleep evaluation if you snore a lot or have excessive sleepiness.  Take medicines as directed by your health care provider.  For some people, aspirin or blood thinners (anticoagulants) are helpful in reducing the risk of forming abnormal blood clots that can lead to stroke. If you have the irregular heart rhythm of atrial fibrillation, you should be on a blood thinner unless there is a good reason you cannot take them.  Understand all your medicine instructions.  Make sure that other other conditions (such as anemia or atherosclerosis) are addressed. SEEK IMMEDIATE MEDICAL CARE IF:   You have sudden weakness or numbness of the face, arm, or leg, especially on one side of the body.  Your face or eyelid droops to one side.  You have sudden confusion.  You have trouble speaking (aphasia) or understanding.  You have sudden trouble seeing in one or both eyes.  You have sudden trouble walking.  You have dizziness.  You have a loss of balance or coordination.  You have a sudden, severe headache with no known cause.  You have new chest pain or an irregular heartbeat. Any of these symptoms may represent a serious problem that is an emergency. Do not wait to see if the symptoms will go away. Get medical help at once. Call your local emergency services  (911 in [place] Do not drive yourself to the hospital. Document Released: 10/27/2004 Document Revised: 07/10/2013 Document Reviewed: 03/22/2013 ExitCare Patient Information 2014 [employer].   Peripheral Vascular Disease Peripheral Vascular Disease (PVD), also called Peripheral Arterial Disease (PAD), is a circulation problem caused by cholesterol (atherosclerotic plaque) deposits in the   arteries. PVD commonly occurs in the lower extremities (legs) but it can occur in other areas of the body, such as your arms. The cholesterol buildup in the arteries reduces blood flow which can cause pain and other serious problems. The presence  of PVD can place a person at risk for Coronary Artery Disease (CAD).  CAUSES  Causes of PVD can be many. It is usually associated with more than one risk factor such as:   High Cholesterol.  Smoking.  Diabetes.  Lack of exercise or inactivity.  High blood pressure (hypertension).  Obesity.  Family history. SYMPTOMS   When the lower extremities are affected, patients with PVD may experience:  Leg pain with exertion or physical activity. This is called INTERMITTENT CLAUDICATION. This may present as cramping or numbness with physical activity. The location of the pain is associated with the level of blockage. For example, blockage at the abdominal level (distal abdominal aorta) may result in buttock or hip pain. Lower leg arterial blockage may result in calf pain.  As PVD becomes more severe, pain can develop with less physical activity.  In people with severe PVD, leg pain may occur at rest.  Other PVD signs and symptoms:  Leg numbness or weakness.  Coldness in the affected leg or foot, especially when compared to the other leg.  A change in leg color.  Patients with significant PVD are more prone to ulcers or sores on toes, feet or legs. These may take longer to heal or may reoccur. The ulcers or sores can become infected.  If signs and symptoms of PVD are ignored, gangrene may occur. This can result in the loss of toes or loss of an entire limb.  Not all leg pain is related to PVD. Other medical conditions can cause leg pain such as:  Blood clots (embolism) or Deep Vein Thrombosis.  Inflammation of the blood vessels (vasculitis).  Spinal stenosis. DIAGNOSIS  Diagnosis of PVD can involve several different types of tests. These can include:  Pulse Volume Recording Method (PVR). This test is simple, painless and does not involve the use of X-rays. PVR involves measuring and comparing the blood pressure in the arms and legs. An ABI (Ankle-Brachial Index) is calculated.  The normal ratio of blood pressures is 1. As this number becomes smaller, it indicates more severe disease.  < 0.95  indicates significant narrowing in one or more leg vessels.  <0.8 there will usually be pain in the foot, leg or buttock with exercise.  <0.4 will usually have pain in the legs at rest.  <0.25  usually indicates limb threatening PVD.  Doppler detection of pulses in the legs. This test is painless and checks to see if you have a pulses in your legs/feet.  A dye or contrast material (a substance that highlights the blood vessels so they show up on x-ray) may be given to help your caregiver better see the arteries for the following tests. The dye is eliminated from your body by the kidney's. Your caregiver may order blood work to check your kidney function and other laboratory values before the following tests are performed:  Magnetic Resonance Angiography (MRA). An MRA is a picture study of the blood vessels and arteries. The MRA machine uses a large magnet to produce images of the blood vessels.  Computed Tomography Angiography (CTA). A CTA is a specialized x-ray that looks at how the blood flows in your blood vessels. An IV may be inserted into your arm so contrast dye   can be injected.  Angiogram. Is a procedure that uses x-rays to look at your blood vessels. This procedure is minimally invasive, meaning a small incision (cut) is made in your groin. A small tube (catheter) is then inserted into the artery of your groin. The catheter is guided to the blood vessel or artery your caregiver wants to examine. Contrast dye is injected into the catheter. X-rays are then taken of the blood vessel or artery. After the images are obtained, the catheter is taken out. TREATMENT  Treatment of PVD involves many interventions which may include:  Lifestyle changes:  Quitting smoking.  Exercise.  Following a low fat, low cholesterol diet.  Control of diabetes.  Foot care is very  important to the PVD patient. Good foot care can help prevent infection.  Medication:  Cholesterol-lowering medicine.  Blood pressure medicine.  Anti-platelet drugs.  Certain medicines may reduce symptoms of Intermittent Claudication.  Interventional/Surgical options:  Angioplasty. An Angioplasty is a procedure that inflates a balloon in the blocked artery. This opens the blocked artery to improve blood flow.  Stent Implant. A wire mesh tube (stent) is placed in the artery. The stent expands and stays in place, allowing the artery to remain open.  Peripheral Bypass Surgery. This is a surgical procedure that reroutes the blood around a blocked artery to help improve blood flow. This type of procedure may be performed if Angioplasty or stent implants are not an option. SEEK IMMEDIATE MEDICAL CARE IF:   You develop pain or numbness in your arms or legs.  Your arm or leg turns cold, becomes blue in color.  You develop redness, warmth, swelling and pain in your arms or legs. MAKE SURE YOU:   Understand these instructions.  Will watch your condition.  Will get help right away if you are not doing well or get worse. Document Released: 10/27/2004 Document Revised: 12/12/2011 Document Reviewed: 09/23/2008 ExitCare Patient Information 2014 [employer].  

## 2014-04-17 ENCOUNTER — Telehealth: Payer: Self-pay | Admitting: Cardiovascular Disease

## 2014-04-17 NOTE — Telephone Encounter (Signed)
Message forwarded to Curt Bears, RN and Dr. [name] to advise once clearance request sheet is received.

## 2014-04-17 NOTE — Telephone Encounter (Signed)
Pt needs clarence for surgery for kidney stones.She will fax over the clarence sheet.

## 2014-04-22 NOTE — Telephone Encounter (Signed)
Request received. Dr [name] reviewed the chart and gave clearance to proceed.  Form faxed back to [facility]

## 2014-05-06 ENCOUNTER — Telehealth: Payer: Self-pay | Admitting: Cardiovascular Disease

## 2014-05-08 NOTE — Telephone Encounter (Signed)
Closed encounter °

## 2014-05-12 ENCOUNTER — Ambulatory Visit (HOSPITAL_COMMUNITY)
Admission: RE | Admit: 2014-05-12 | Discharge: 2014-05-12 | Disposition: A | Discharge status: Home / Self Care | Payer: Medicare Other | Source: Ambulatory Visit | Attending: Cardiovascular Disease | Admitting: Cardiovascular Disease

## 2014-05-12 DIAGNOSIS — I701 Atherosclerosis of renal artery: Secondary | ICD-10-CM | POA: Diagnosis present

## 2014-05-12 DIAGNOSIS — I10 Essential (primary) hypertension: Secondary | ICD-10-CM

## 2014-05-12 NOTE — Progress Notes (Signed)
Renal Duplex Completed. Daisie Haft, BS, RDMS, RVT  

## 2014-05-14 ENCOUNTER — Encounter: Payer: Self-pay | Admitting: *Deleted

## 2014-06-13 ENCOUNTER — Telehealth: Payer: Self-pay | Admitting: Cardiovascular Disease

## 2014-06-13 NOTE — Telephone Encounter (Signed)
Pt wants to know if she should wear the compression socks when she fly to [place]?

## 2014-06-13 NOTE — Telephone Encounter (Signed)
Currently not wearing compression stockings but after her MI in [facility] she was advised to wear them on her flight home.  Told her it will hurt to wear compression hose if she chooses and to make sure she gets up frequently and walks around during the long flight.  Voiced understanding.

## 2014-07-09 ENCOUNTER — Telehealth: Payer: Self-pay | Admitting: Cardiovascular Disease

## 2014-07-09 NOTE — Telephone Encounter (Signed)
Letter created and faxed to [facility]

## 2014-07-09 NOTE — Telephone Encounter (Signed)
Need clarence for colonoscopy.Please send fax over for [place].Please fax [phone] [id number]

## 2014-07-09 NOTE — Telephone Encounter (Signed)
Cleared to undergo colonoscopy. She is not on any medicines that he did be held.

## 2014-07-09 NOTE — Telephone Encounter (Signed)
Message routed to Dr. Berry/Kathryn, RN to advise on clearance.  

## 2014-07-11 ENCOUNTER — Other Ambulatory Visit: Payer: Self-pay

## 2014-07-11 MED ORDER — PANTOPRAZOLE SODIUM 40 MG PO TBEC: 40.0000 mg | DELAYED_RELEASE_TABLET | Freq: Every day | ORAL | Status: DC

## 2014-07-11 NOTE — Telephone Encounter (Signed)
Rx was sent to pharmacy electronically. 

## 2014-07-22 ENCOUNTER — Encounter: Payer: Self-pay | Admitting: Cardiovascular Disease

## 2014-07-22 ENCOUNTER — Ambulatory Visit (INDEPENDENT_AMBULATORY_CARE_PROVIDER_SITE_OTHER): Payer: Medicare Other | Admitting: Cardiovascular Disease

## 2014-07-22 VITALS — BP 122/78 | HR 81 | Ht 64.0 in | Wt 192.7 lb

## 2014-07-22 DIAGNOSIS — I2510 Atherosclerotic heart disease of native coronary artery without angina pectoris: Secondary | ICD-10-CM

## 2014-07-22 DIAGNOSIS — I10 Essential (primary) hypertension: Secondary | ICD-10-CM

## 2014-07-22 DIAGNOSIS — I2583 Coronary atherosclerosis due to lipid rich plaque: Secondary | ICD-10-CM

## 2014-07-22 DIAGNOSIS — I739 Peripheral vascular disease, unspecified: Secondary | ICD-10-CM

## 2014-07-22 DIAGNOSIS — I701 Atherosclerosis of renal artery: Secondary | ICD-10-CM

## 2014-07-22 DIAGNOSIS — E785 Hyperlipidemia, unspecified: Secondary | ICD-10-CM

## 2014-07-22 NOTE — Assessment & Plan Note (Signed)
History of CAD status post witnessed cardiac arrest with defibrillation multiple coronary bypass grafting in [facility] 09/27/2010. She had a prolonged hospitalization and was on a ventilator as well as a rehabilitation. Echo performed 4/12 showed normal LV function in my view should was normal as well. She denies chest pain or shortness of breath

## 2014-07-22 NOTE — Progress Notes (Signed)
07/22/2014 Alicia Crawford   12-Dec-1955  [id number]  Primary Physician Eunice Blase, MD Primary Cardiologist: Lorretta Harp MD [name]   HPI:  The patient is a 58 year old, moderately overweight, married Caucasian female, mother of 3 who I last saw in the office 12 months ago. She has a history of remote tobacco abuse having quit 10 years ago, hypertension, hyperlipidemia and a family history of heart disease with both mother and father who had CAD and a brother who had a stent. She had an episode of witnessed cardiac arrest, CPR, defibrillation and ultimately coronary artery bypass grafting in [name] September 27, 2010. She had prolonged hospitalization on a ventilator and rehab. Echo performed April 2012 showed normal LV systolic function. A Myoview was normal as well. She otherwise is asymptomatic except for lifestyle-limiting claudication. Dopplers performed in our office December 06, 2011, revealed ABIs of 0.46 on the right with an occluded SFA and high-grade distal right common, profunda stenosis, left ABI of 0.62 with an occluded left SFA. I angiogram'd her January 17, 2012, demonstrating occluded SFAs bilaterally with 2-vessel runoff on the right, 1 on the left via peroneal. She did have a 95% calcified ostial profunda femoris stenosis on the right jeopardizing collaterals to her infrapopliteal vessels as well as a 60% distal left common femoral artery stenosis. She also had an incidentally noted 35% right renal artery stenosis. I reviewed the films with Dr. [name] who felt surgical revascularization was optimal. She in the interim developed a subdural hematoma after falling and hitting her head and her workup was put on hold. Today in the office she continues to complain of lifestyle-limiting claudication. She apparently saw Dr. [name] who recommended continued medical/conservative therapy. Since I saw her back in the office one year  ago she's remained completely  asymptomatic. She denies chest pain or shortness of breath. She does complain of lifestyle limiting claudication however    Current Outpatient Prescriptions  Medication Sig Dispense Refill  . Ascorbic Acid (VITAMIN C) 100 MG tablet Take 100 mg by mouth daily.      [name] atorvastatin (LIPITOR) 80 MG tablet Take 100 mg by mouth daily.      . cilostazol (PLETAL) 100 MG tablet Take 1 tablet (100 mg total) by mouth 2 (two) times daily before a meal.  180 tablet  2  . Coenzyme Q10 (EQL COQ10) 300 MG CAPS Take 1 capsule by mouth daily.      . Cyanocobalamin (VITAMIN B 12 PO) Take 1,000 mg by mouth.      . D-Ribose (RIBOSE, D,) POWD by Does not apply route daily. One scoop per day      . LECITHIN PO Take 1,200 mg by mouth daily.      [name] LORazepam (ATIVAN) 1 MG tablet Take 1 mg by mouth at bedtime.       [name] losartan (COZAAR) 50 MG tablet Take 50 mg by mouth daily.      . metFORMIN (GLUCOPHAGE) 500 MG tablet Take 500 mg by mouth 2 (two) times daily with a meal.       . metoprolol tartrate (LOPRESSOR) 25 MG tablet Take 1 tablet (25 mg total) by mouth 2 (two) times daily.  180 tablet  1  . Multiple Vitamin (MULITIVITAMIN WITH MINERALS) TABS Take 1 tablet by mouth daily.      [name] omega-3 fish oil (MAXEPA) 1000 MG CAPS capsule Take 1 capsule by mouth daily. Mega Red      . pantoprazole (  PROTONIX) 40 MG tablet Take 1 tablet (40 mg total) by mouth daily. Must keep appointment 07/22/2014 with Dr [name] for future refills.  90 tablet  0  . Plant Stanol Ester (BENECOL PO) Take by mouth daily.      . sertraline (ZOLOFT) 50 MG tablet Take 100 mg by mouth 2 (two) times daily.        No current facility-administered medications for this visit.    Allergies  Allergen Reactions  . Ativan [Lorazepam] Palpitations    Family states patient has extreme bradycardia with this med.  . Halothane Hives and Other (See Comments)    Gas used 1980's Reaction: affected her liver    History   Social History  . Marital Status:  Married    Spouse Name: N/A    Number of Children: N/A  . Years of Education: N/A   Occupational History  . Not on file.   Social History Main Topics  . Smoking status: Former Smoker -- 1.00 packs/day for 30 years    Types: Cigarettes    Quit date: 06/22/2005  . Smokeless tobacco: Never Used  . Alcohol Use: 0.0 oz/week     Comment: 01/17/12 "used to have glass of wine now & then; last wine was ~ 2011"  . Drug Use: No  . Sexual Activity: Yes   Other Topics Concern  . Not on file   Social History Narrative  . No narrative on file     Review of Systems: General: negative for chills, fever, night sweats or weight changes.  Cardiovascular: negative for chest pain, dyspnea on exertion, edema, orthopnea, palpitations, paroxysmal nocturnal dyspnea or shortness of breath Dermatological: negative for rash Respiratory: negative for cough or wheezing Urologic: negative for hematuria Abdominal: negative for nausea, vomiting, diarrhea, bright red blood per rectum, melena, or hematemesis Neurologic: negative for visual changes, syncope, or dizziness All other systems reviewed and are otherwise negative except as noted above.    Blood pressure 122/78, pulse 81, height 5\' 4"  (1.626 m), weight 192 lb 11.2 oz (87.408 kg).  General appearance: alert and no distress Neck: no adenopathy, no carotid bruit, no JVD, supple, symmetrical, trachea midline and thyroid not enlarged, symmetric, no tenderness/mass/nodules Lungs: clear to auscultation bilaterally Heart: regular rate and rhythm, S1, S2 normal, no murmur, click, rub or gallop Extremities: extremities normal, atraumatic, no cyanosis or edema  EKG normal sinus rhythm at 81 without ST or T wave changes.  ASSESSMENT AND PLAN:   Peripheral artery disease, bilta LE PVD by angio April 2013 History of peripheral arterial disease status post peripheral angiography which I performed/16/13 bili occluded SFAs bilaterally with three-vessel runoff on  the right and one on the left. She did have an incidentally noted 75% right renal artery stenosis which we have been following a duplex ultrasound and has remained stable. She does complain of some lifestyle limiting claudication though [facility]. Her only options for lower extremity revascularization or surgical. At this point, we are going to treat her conservatively.  Renal artery stenosis, Right, 75% Found at the time of angiography in 2013, followed by duplex ultrasound  HTN (hypertension) Controlled on current medications  HLD (hyperlipidemia) On statin therapy followed by her PCP  CAD, cardiac arrest, CPR, CABG in [facility] Dec 2011-NL LVF by Echo, neg Nuc April 2013 History of CAD status post witnessed cardiac arrest with defibrillation multiple coronary bypass grafting in [facility] 09/27/2010. She had a prolonged hospitalization and  was on a ventilator as well as a rehabilitation. Echo performed 4/12 showed normal LV function in my view should was normal as well. She denies chest pain or shortness of breath      Lorretta Harp MD [facility], [facility] 07/22/2014 12:34 PM

## 2014-07-22 NOTE — Assessment & Plan Note (Signed)
History of peripheral arterial disease status post peripheral angiography which I performed/16/13 bili occluded SFAs bilaterally with three-vessel runoff on the right and one on the left. She did have an incidentally noted 75% right renal artery stenosis which we have been following a duplex ultrasound and has remained stable. She does complain of some lifestyle limiting claudication though [facility]. Her only options for lower extremity revascularization or surgical. At this point, we are going to treat her conservatively.

## 2014-07-22 NOTE — Patient Instructions (Signed)
Dr [name] recommends that you schedule a follow-up appointment in 1 year. You will receive a reminder letter in the mail two months in advance. If you don't receive a letter, please call our office to schedule the follow-up appointment.  Your physician has requested that you have a renal artery duplex prior to your appointment with Dr [name]. During this test, an ultrasound is used to evaluate blood flow to the kidneys. Allow one hour for this exam. Do not eat after midnight the day before and avoid carbonated beverages. Take your medications as you usually do.

## 2014-07-22 NOTE — Assessment & Plan Note (Signed)
On statin therapy followed by her PCP 

## 2014-07-22 NOTE — Assessment & Plan Note (Signed)
Controlled on current medications 

## 2014-07-22 NOTE — Assessment & Plan Note (Signed)
Found at the time of angiography in 2013, followed by duplex ultrasound

## 2014-09-03 ENCOUNTER — Telehealth: Payer: Self-pay | Admitting: Cardiovascular Disease

## 2014-09-03 ENCOUNTER — Other Ambulatory Visit: Payer: Self-pay | Admitting: Cardiovascular Disease

## 2014-09-03 MED ORDER — METOPROLOL TARTRATE 25 MG PO TABS: 25.0000 mg | ORAL_TABLET | Freq: Two times a day (BID) | ORAL | Status: DC

## 2014-09-03 NOTE — Telephone Encounter (Signed)
Pt need a new prescription for Metoprolol Tartrate 25 mg #180 and refills. [id number]

## 2014-09-03 NOTE — Telephone Encounter (Signed)
Rx refill sent to patient pharmacy   

## 2014-09-04 NOTE — Telephone Encounter (Signed)
Rx was sent to pharmacy electronically. 

## 2014-09-11 ENCOUNTER — Encounter (HOSPITAL_COMMUNITY): Payer: Self-pay | Admitting: Cardiovascular Disease

## 2014-09-17 ENCOUNTER — Other Ambulatory Visit: Payer: Self-pay | Admitting: Cardiovascular Disease

## 2014-10-31 ENCOUNTER — Other Ambulatory Visit: Payer: Self-pay | Admitting: Cardiovascular Disease

## 2014-10-31 NOTE — Telephone Encounter (Signed)
pletal refilled #180 w/3 refills 09/04/2014

## 2014-12-16 ENCOUNTER — Telehealth: Payer: Self-pay | Admitting: *Deleted

## 2014-12-16 NOTE — Telephone Encounter (Signed)
Dr. [name] received a note from [place], [place] with [facility] regarding the interaction of patient's Zoloft with the Pletal (Cilostazol) that we had prescribed last year. I called the patient and she really did not see any improvement with her BLE pain, so I have instructed her to discontinue her Pletal. She voiced understanding and agreement with this plan . This letter was sent to be scanned into the patient's EPIC chart.

## 2015-01-21 ENCOUNTER — Other Ambulatory Visit: Payer: Self-pay | Admitting: Family Medicine

## 2015-01-21 ENCOUNTER — Other Ambulatory Visit (HOSPITAL_COMMUNITY)
Admission: RE | Admit: 2015-01-21 | Discharge: 2015-01-21 | Disposition: A | Discharge status: Home / Self Care | Payer: Medicare Other | Source: Ambulatory Visit | Attending: Family Medicine | Admitting: Family Medicine

## 2015-01-21 DIAGNOSIS — Z124 Encounter for screening for malignant neoplasm of cervix: Secondary | ICD-10-CM | POA: Diagnosis present

## 2015-01-22 ENCOUNTER — Ambulatory Visit
Admission: RE | Admit: 2015-01-22 | Discharge: 2015-01-22 | Disposition: A | Discharge status: Home / Self Care | Payer: Medicare Other | Source: Ambulatory Visit | Attending: Family Medicine | Admitting: Family Medicine

## 2015-01-22 ENCOUNTER — Other Ambulatory Visit: Payer: Self-pay | Admitting: Family Medicine

## 2015-01-22 DIAGNOSIS — R109 Unspecified abdominal pain: Secondary | ICD-10-CM

## 2015-01-22 LAB — CYTOLOGY - PAP

## 2015-01-26 ENCOUNTER — Telehealth: Payer: Self-pay | Admitting: Cardiovascular Disease

## 2015-01-26 NOTE — Telephone Encounter (Signed)
FYI:  She wanted to let Dr. [name] know that she has started back taking Cilostazol.  Thanks

## 2015-02-19 ENCOUNTER — Other Ambulatory Visit: Payer: Self-pay | Admitting: Cardiovascular Disease

## 2015-02-20 NOTE — Telephone Encounter (Signed)
Rx(s) sent to pharmacy electronically.  

## 2015-03-11 ENCOUNTER — Encounter: Payer: Self-pay | Admitting: Family

## 2015-03-16 ENCOUNTER — Ambulatory Visit (INDEPENDENT_AMBULATORY_CARE_PROVIDER_SITE_OTHER)
Admission: RE | Admit: 2015-03-16 | Discharge: 2015-03-16 | Disposition: A | Discharge status: Home / Self Care | Payer: Medicare Other | Source: Ambulatory Visit | Attending: Family | Admitting: Family

## 2015-03-16 ENCOUNTER — Ambulatory Visit (INDEPENDENT_AMBULATORY_CARE_PROVIDER_SITE_OTHER): Payer: Medicare Other | Admitting: Family

## 2015-03-16 ENCOUNTER — Ambulatory Visit (HOSPITAL_COMMUNITY)
Admission: RE | Admit: 2015-03-16 | Discharge: 2015-03-16 | Disposition: A | Discharge status: Home / Self Care | Payer: Medicare Other | Source: Ambulatory Visit | Attending: Family | Admitting: Family

## 2015-03-16 ENCOUNTER — Encounter: Payer: Self-pay | Admitting: Family

## 2015-03-16 VITALS — BP 130/72 | HR 76 | Resp 16 | Ht 64.0 in | Wt 190.0 lb

## 2015-03-16 DIAGNOSIS — I739 Peripheral vascular disease, unspecified: Secondary | ICD-10-CM | POA: Diagnosis not present

## 2015-03-16 DIAGNOSIS — Z48812 Encounter for surgical aftercare following surgery on the circulatory system: Secondary | ICD-10-CM | POA: Diagnosis not present

## 2015-03-16 DIAGNOSIS — I6523 Occlusion and stenosis of bilateral carotid arteries: Secondary | ICD-10-CM | POA: Diagnosis not present

## 2015-03-16 DIAGNOSIS — I779 Disorder of arteries and arterioles, unspecified: Secondary | ICD-10-CM | POA: Diagnosis not present

## 2015-03-16 DIAGNOSIS — I70219 Atherosclerosis of native arteries of extremities with intermittent claudication, unspecified extremity: Secondary | ICD-10-CM

## 2015-03-16 DIAGNOSIS — I70209 Unspecified atherosclerosis of native arteries of extremities, unspecified extremity: Secondary | ICD-10-CM

## 2015-03-16 NOTE — Patient Instructions (Signed)

## 2015-03-16 NOTE — Progress Notes (Signed)
[facility] [place] HISTORY AND PHYSICAL   MRN : [id number]  History of Present Illness:   Alicia Crawford is a 59 y.o. female patient of Dr. [name] who is s/p angiography in April, 2014 for bilateral claudication. 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 She also has carotid artery disease. She returns today for follow up. She has a history of hypertension and hyperlipidemia which are medically managed. She had a witnessed episode of cardiac arrest with CPR and defibrillation which eventually led to coronary artery bypass grafting in 2011. During this hospitalization she was told that she had already had a stroke before this, but she does not recall any symptoms of ever having a stroke. She has had mild expressive aphasia since the cardiac arrest. She denies any monocular loss of vision. In 2013 she fell, hit her head, and sustained an intracranial bleed. She states that prior to her craniotomy she was able to walk 1.2 miles without stopping. She states that she would occasionally experience an aching in both of her legs. She would also have aching at night. She had previously tried Pletal but did not tolerate it. Dr. [name] decreased her dose to 50 mg twice a day. She is tolerating this.  She stopped taking Pletal on advice as one or her providers was concerned about the effect Pletal had on her depression, but after a week claudication in her legs became unbearable; she then resumed Pletal.   Patient has not had previous carotid artery intervention.  She walks daily, her legs hurt after walking on an incline for about 1/4 mile, her calves and thighs hurt after walking a short distance but she perseveres. She does walk a mile daily. She does not have rest pain, does not have non healing wounds.  She also swims 3-4 days/week. Pt denies ever  having DVT. She reports that she has a history of MRSA connected with a tummy tuck.  Pt denies New Medical or Surgical History.  Pt Diabetic: Yes, states in good control Pt smoker: former smoker, quit 2008  Pt meds include: Statin : Yes ASA: No, was told not to take ASA after her head injury in 2013 Other anticoagulants/antiplatelets: cilostazol     Current Outpatient Prescriptions  Medication Sig Dispense Refill  . Ascorbic Acid (VITAMIN C) 100 MG tablet Take 100 mg by mouth daily.    [name] atorvastatin (LIPITOR) 80 MG tablet TAKE 1 BY MOUTH DAILY 90 tablet 3  . cilostazol (PLETAL) 100 MG tablet TAKE 1 BY MOUTH TWICE DAILY BEFORE A MEAL 180 tablet 3  . Coenzyme Q10 (EQL COQ10) 300 MG CAPS Take 1 capsule by mouth daily.    . Cyanocobalamin (VITAMIN B 12 PO) Take 1,000 mg by mouth.    . D-Ribose (RIBOSE, D,) POWD by Does not apply route daily. One scoop per day    . LECITHIN PO Take 1,200 mg by mouth daily.    [name] LORazepam (ATIVAN) 1 MG tablet Take 1 mg by mouth at bedtime.     [name] losartan (COZAAR) 50 MG tablet Take 50 mg by mouth daily.    . metFORMIN (GLUCOPHAGE) 1000 MG tablet daily.  0  . Multiple Vitamin (MULITIVITAMIN WITH MINERALS) TABS Take 1 tablet by mouth daily.    [name] omega-3 fish oil (MAXEPA) 1000 MG CAPS capsule Take 1 capsule by mouth daily. Mega Red    . pantoprazole (PROTONIX)  40 MG tablet Take 1 tablet (40 mg total) by mouth daily. 90 tablet 1  . Plant Stanol Ester (BENECOL PO) Take by mouth daily.    . sertraline (ZOLOFT) 100 MG tablet 100 mg 2 (two) times daily.   0  . metFORMIN (GLUCOPHAGE) 500 MG tablet Take 500 mg by mouth 2 (two) times daily with a meal.     . metoprolol tartrate (LOPRESSOR) 25 MG tablet Take 1 tablet (25 mg total) by mouth 2 (two) times daily. (Patient not taking: Reported on 03/16/2015) 180 tablet 1  . sertraline (ZOLOFT) 50 MG tablet Take 100 mg by mouth 2 (two) times daily.      No current facility-administered medications for this visit.     Past Medical History  Diagnosis Date  . Hypertension   . Claudication   . Anoxic brain injury 08/2010    "w/cardiac arrest"  . Rotator cuff tendonitis   . Complication of anesthesia     [place]  . High cholesterol   . Cardiac arrest 08/04/2010  . Blood transfusion 09/2010  . Depression   . DEMENTIA   . Coronary artery disease     CABG 2011 in [place]  . Peripheral vascular disease     bilat SFA diseae  . Carotid artery occlusion   . Obesity   . Myocardial infarction 09/27/10    in [place]  . Anxiety Jan. 2012  . DVT (deep venous thrombosis)   . Subdural hematoma May 25,2013    Frontal subdural  S/P evacuation of hematoma  . Coronary artery disease   . Stroke 2011  . Type II diabetes mellitus     Diet and Exercise  . Renal artery stenosis     Social History History  Substance Use Topics  . Smoking status: Former Smoker -- 1.00 packs/day for 30 years    Types: Cigarettes    Quit date: 06/22/2005  . Smokeless tobacco: Never Used  . Alcohol Use: 0.0 oz/week     Comment: 01/17/12 "used to have glass of wine now & then; last wine was ~ 2011"    Family History Family History  Problem Relation Age of Onset  . Heart disease Other   . Diabetes Other   . Hypertension Other   . Alcohol abuse Other   . Mental illness Other   . Heart disease Mother     before age 50  . Diabetes Mother   . Hyperlipidemia Mother   . Hypertension Mother   . Heart attack Mother   . Heart disease Father     Before age 42  . Hyperlipidemia Father   . Hypertension Father   . Heart attack Father   . Heart disease Sister     Before age 1  . Heart attack Sister   . Hyperlipidemia Brother   . Hypertension Brother   . Heart attack Brother   . Heart disease Brother     before age 78    Surgical History Past Surgical History  Procedure Laterality Date  . Ovarian cyst removal  1983  . Stomach and skin tuck  2000  . Posterior fusion lumbar spine  ` 2000  .  Cholecystectomy  ~ 1980's  . Coronary artery bypass graft  09/2010    CABG X4  . Back surgery    . Craniotomy  02/25/2012    Procedure: CRANIOTOMY HEMATOMA EVACUATION SUBDURAL;  Surgeon: Eustace Moore, MD;  Location: [facility];  Service: Neurosurgery;  Laterality: Left;  Left craniotomy for evacuation of subdural hematoma  . Lower extremity angiogram  April 2013  . Spine surgery    . Atherectomy N/A 01/17/2012    Procedure: ATHERECTOMY;  Surgeon: Lorretta Harp, MD;  Location: [facility] CATH LAB;  Service: Cardiovascular;  Laterality: N/A;    Allergies  Allergen Reactions  . Ativan [Lorazepam] Palpitations    Family states patient has extreme bradycardia with this med.  . Halothane Hives and Other (See Comments)    Gas used 1980's Reaction: affected her liver    Current Outpatient Prescriptions  Medication Sig Dispense Refill  . Ascorbic Acid (VITAMIN C) 100 MG tablet Take 100 mg by mouth daily.    [name] atorvastatin (LIPITOR) 80 MG tablet TAKE 1 BY MOUTH DAILY 90 tablet 3  . cilostazol (PLETAL) 100 MG tablet TAKE 1 BY MOUTH TWICE DAILY BEFORE A MEAL 180 tablet 3  . Coenzyme Q10 (EQL COQ10) 300 MG CAPS Take 1 capsule by mouth daily.    . Cyanocobalamin (VITAMIN B 12 PO) Take 1,000 mg by mouth.    . D-Ribose (RIBOSE, D,) POWD by Does not apply route daily. One scoop per day    . LECITHIN PO Take 1,200 mg by mouth daily.    [name] LORazepam (ATIVAN) 1 MG tablet Take 1 mg by mouth at bedtime.     [name] losartan (COZAAR) 50 MG tablet Take 50 mg by mouth daily.    . metFORMIN (GLUCOPHAGE) 1000 MG tablet daily.  0  . Multiple Vitamin (MULITIVITAMIN WITH MINERALS) TABS Take 1 tablet by mouth daily.    [name] omega-3 fish oil (MAXEPA) 1000 MG CAPS capsule Take 1 capsule by mouth daily. Mega Red    . pantoprazole (PROTONIX) 40 MG tablet Take 1 tablet (40 mg total) by mouth daily. 90 tablet 1  . Plant Stanol Ester (BENECOL PO) Take by mouth daily.    . sertraline (ZOLOFT) 100 MG tablet 100 mg 2 (two) times  daily.   0  . metFORMIN (GLUCOPHAGE) 500 MG tablet Take 500 mg by mouth 2 (two) times daily with a meal.     . metoprolol tartrate (LOPRESSOR) 25 MG tablet Take 1 tablet (25 mg total) by mouth 2 (two) times daily. (Patient not taking: Reported on 03/16/2015) 180 tablet 1  . sertraline (ZOLOFT) 50 MG tablet Take 100 mg by mouth 2 (two) times daily.      No current facility-administered medications for this visit.     REVIEW OF SYSTEMS: See HPI for pertinent positives and negatives.  Physical Examination Filed Vitals:   03/16/15 1556 03/16/15 1604  BP: 142/70 130/72  Pulse: 76 76  Resp:  16  Height:  5\' 4"  (1.626 m)  Weight:  190 lb (86.183 kg)  SpO2:  95%   Body mass index is 32.6 kg/(m^2).  General: WDWN obese female in NAD GAIT: normal Eyes: PERRLA Pulmonary: Non-labored, CTAB, Negative Rales, Negative rhonchi, & Negative wheezing.  Cardiac: regular Rhythm, no detected murmur.  VASCULAR EXAM Carotid Bruits Left Right   Negative Negative   Aorta is not palpable. Radial pulses are 2+ palpable and equal.      LE Pulses LEFT RIGHT   FEMORAL 2+ palpable 2+ palpable    POPLITEAL not palpable  not palpable   POSTERIOR TIBIAL not palpable  not palpable    DORSALIS PEDIS  ANTERIOR TIBIAL 1+ palpable  2+ palpable     Gastrointestinal: soft, nontender, BS WNL, no r/g,no palpable masses.  Musculoskeletal: Negative muscle atrophy/wasting. M/S  5/5 throughout, Extremities without ischemic changes.  Neurologic: A&O X 3; Appropriate Affect, Speech is normal, CN 2-12 intact, Pain and light touch intact in extremities, Motor exam as listed above.          Non-Invasive Vascular Imaging (03/16/2015):  CEREBROVASCULAR DUPLEX EVALUATION    INDICATION: Carotid disease    PREVIOUS  INTERVENTION(S):     DUPLEX EXAM:     RIGHT  LEFT  Peak Systolic Velocities (cm/s) End Diastolic Velocities (cm/s) Plaque LOCATION Peak Systolic Velocities (cm/s) End Diastolic Velocities (cm/s) Plaque  74 11  CCA PROXIMAL 65 19   95 16  CCA MID 103 24 HT  86 22 HT CCA DISTAL 112 31   288 17 CP ECA 449 21 CP  153 43 CP ICA PROXIMAL 157 53 CP  77 26  ICA MID 46 20   69 26  ICA DISTAL 54 23     1.8 ICA / CCA Ratio (PSV) 1.4  Antegrade Vertebral Flow Antegrade  003 Brachial Systolic Pressure (mmHg) 704  Multiphasic (subclavian artery) Brachial Artery Waveforms Multiphasic (subclavian artery)    Plaque Morphology:  HM = Homogeneous, HT = Heterogeneous, CP = Calcific Plaque, SP = Smooth Plaque, IP = Irregular Plaque  ADDITIONAL FINDINGS: . No significant stenosis of the bilateral common carotid arteries. . Greater than 50% bilateral external carotid artery stenoses noted.    IMPRESSION: Doppler velocities suggest 50-69% stenoses of the bilateral proximal internal carotid arteries.    Compared to the previous exam:  Increase in the maximum right internal carotid artery velocity noted when compared to the previous exam on 03/14/14 with the left internal carotid artery remaining stable.      ABI (Date: 03/16/2015) R: 0.71 (0.70, 03/14/14), DP: monophasic, PT: monophasic, TBI: 0.53 L: 0.77 (0.85), DP: monophasic, PT: monophasic, TBI: 0.60   ASSESSMENT:  Alicia Crawford is a 59 y.o. female who is s/p angiography in April, 2014 for bilateral claudication. 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  She also has carotid artery disease. She was told that she had a stroke at some point before her witnessed cardiac arrest in 2011 which eventually led to a CABG. She has had mild expressive aphasia since the cardiac arrest. She has claudication within a few minutes of  walking, but is able to walk a mile daily and swim several times/week. She has no tissue loss. She is not any less active than she had been.  Today's carotid Duplex suggests 50-69% stenoses of the bilateral proximal internal internal carotid arteries. Increase in the maximum right internal carotid artery velocity when compared to the previous exam on 03/14/14 with the left internal carotid artery remaining stable.  ABI's: right LE remains stable with moderately decreased arterial perfusion and left LE perfusion has decreased to moderate from mild arterial occlusive disease.   Increase in right ICA stenosis and slight decrease in left lower extremity arterial perfusion with no discernible reason since her DM has remained in good control, she has not used tobacco since 2008, is not exposed to second hand smoke, is just as active as she had been, her cholesterol is under medical management.  She was advised not to take ASA after her 2013 head injury in which she sustained a subdural hematoma and had a craniotomy for evacuation of the subdural frontal hematoma in 2013. Will defer to Dr. Orland Mustard and Dr. [name] as to whether she may  take ASA or Plavix to help reduce her risk of CVD events.    PLAN:   Continue graduated walking program. Continue Pletal. Based on today's exam and non-invasive vascular lab results, the patient will follow up in 6 months with the following tests ABI's and carotid Duplex. She knows to notify our office sooner should she have worsening claudication or develop non healing wounds.  I discussed in depth with the patient the nature of atherosclerosis, and emphasized the importance of maximal medical management including strict control of blood pressure, blood glucose, and lipid levels, obtaining regular exercise, and cessation of smoking.  The patient is aware that without maximal medical management the underlying atherosclerotic disease process will progress, limiting the benefit  of any interventions.  The patient was given information about stroke prevention and what symptoms should prompt the patient to seek immediate medical care.  The patient was given information about PAD including signs, symptoms, treatment, what symptoms should prompt the patient to seek immediate medical care, and risk reduction measures to take. Thank you for allowing [place] to participate in this patient's care.  Clemon Chambers, RN, MSN, FNP-C [facility] Office: [phone]  Clinic MD: [name]  03/16/2015 4:22 PM

## 2015-05-29 ENCOUNTER — Other Ambulatory Visit: Payer: Self-pay | Admitting: Cardiovascular Disease

## 2015-05-29 NOTE — Telephone Encounter (Signed)
Rx request sent to pharmacy.  

## 2015-07-06 ENCOUNTER — Other Ambulatory Visit: Payer: Self-pay | Admitting: Cardiovascular Disease

## 2015-07-06 NOTE — Telephone Encounter (Signed)
REFILL 

## 2015-08-20 ENCOUNTER — Encounter (INDEPENDENT_AMBULATORY_CARE_PROVIDER_SITE_OTHER): Payer: Medicare Other | Admitting: Ophthalmology

## 2015-08-20 DIAGNOSIS — E11311 Type 2 diabetes mellitus with unspecified diabetic retinopathy with macular edema: Secondary | ICD-10-CM

## 2015-08-20 DIAGNOSIS — H35033 Hypertensive retinopathy, bilateral: Secondary | ICD-10-CM | POA: Diagnosis not present

## 2015-08-20 DIAGNOSIS — H33301 Unspecified retinal break, right eye: Secondary | ICD-10-CM

## 2015-08-20 DIAGNOSIS — I10 Essential (primary) hypertension: Secondary | ICD-10-CM

## 2015-08-20 DIAGNOSIS — E113313 Type 2 diabetes mellitus with moderate nonproliferative diabetic retinopathy with macular edema, bilateral: Secondary | ICD-10-CM | POA: Diagnosis not present

## 2015-08-20 DIAGNOSIS — H43813 Vitreous degeneration, bilateral: Secondary | ICD-10-CM | POA: Diagnosis not present

## 2015-08-20 DIAGNOSIS — H2513 Age-related nuclear cataract, bilateral: Secondary | ICD-10-CM | POA: Diagnosis not present

## 2015-08-25 ENCOUNTER — Encounter: Payer: Self-pay | Admitting: Cardiovascular Disease

## 2015-08-25 ENCOUNTER — Ambulatory Visit (INDEPENDENT_AMBULATORY_CARE_PROVIDER_SITE_OTHER): Payer: Medicare Other | Admitting: Cardiovascular Disease

## 2015-08-25 VITALS — BP 126/78 | HR 79 | Ht 64.0 in | Wt 187.0 lb

## 2015-08-25 DIAGNOSIS — I739 Peripheral vascular disease, unspecified: Secondary | ICD-10-CM | POA: Diagnosis not present

## 2015-08-25 DIAGNOSIS — I701 Atherosclerosis of renal artery: Secondary | ICD-10-CM

## 2015-08-25 DIAGNOSIS — I10 Essential (primary) hypertension: Secondary | ICD-10-CM

## 2015-08-25 DIAGNOSIS — I2510 Atherosclerotic heart disease of native coronary artery without angina pectoris: Secondary | ICD-10-CM

## 2015-08-25 DIAGNOSIS — I2583 Coronary atherosclerosis due to lipid rich plaque: Secondary | ICD-10-CM

## 2015-08-25 DIAGNOSIS — E785 Hyperlipidemia, unspecified: Secondary | ICD-10-CM

## 2015-08-25 MED ORDER — LOSARTAN POTASSIUM 50 MG PO TABS: 50.0000 mg | ORAL_TABLET | Freq: Every day | ORAL | Status: DC

## 2015-08-25 MED ORDER — ATORVASTATIN CALCIUM 80 MG PO TABS: ORAL_TABLET | ORAL | Status: DC

## 2015-08-25 MED ORDER — METOPROLOL TARTRATE 25 MG PO TABS: 25.0000 mg | ORAL_TABLET | Freq: Two times a day (BID) | ORAL | Status: DC

## 2015-08-25 NOTE — Assessment & Plan Note (Signed)
History of hyperlipidemia on atorvastatin followed by her PCP 

## 2015-08-25 NOTE — Progress Notes (Signed)
08/25/2015 Alicia Crawford   [birth year]  [id number]  Primary Physician Eunice Blase, MD Primary Cardiologist: Lorretta Harp MD [name]   HPI:  The patient is a 59 year old, moderately overweight, married Caucasian female, mother of 3 who I last saw in the office 12 months ago. She has a history of remote tobacco abuse having quit 10 years ago, hypertension, hyperlipidemia and a family history of heart disease with both mother and father who had CAD and a brother who had a stent. She had an episode of witnessed cardiac arrest, CPR, defibrillation and ultimately coronary artery bypass grafting in [facility] September 27, 2010. She had prolonged hospitalization on a ventilator and rehab. Echo performed April 2012 showed normal LV systolic function. A Myoview was normal as well. She otherwise is asymptomatic except for lifestyle-limiting claudication. Dopplers performed in our office December 06, 2011, revealed ABIs of 0.46 on the right with an occluded SFA and high-grade distal right common, profunda stenosis, left ABI of 0.62 with an occluded left SFA. I angiogram'd her January 17, 2012, demonstrating occluded SFAs bilaterally with 2-vessel runoff on the right, 1 on the left via peroneal. She did have a 95% calcified ostial profunda femoris stenosis on the right jeopardizing collaterals to her infrapopliteal vessels as well as a 60% distal left common femoral artery stenosis. She also had an incidentally noted 35% right renal artery stenosis. I reviewed the films with Dr. [name] who felt surgical revascularization was optimal. She in the interim developed a subdural hematoma after falling and hitting her head and her workup was put on hold. Today in the office she continues to complain of lifestyle-limiting claudication. She apparently saw Dr. [name] who recommended continued medical/conservative therapy. Since I saw her back in the office one year ago she's remained completely  asymptomatic. She denies chest pain or shortness of breath. She does complain of lifestyle limiting claudication however he continues to see Dr. [name] in the office in follow-up to this.   Current Outpatient Prescriptions  Medication Sig Dispense Refill  . Ascorbic Acid (VITAMIN C) 100 MG tablet Take 100 mg by mouth daily.    [name] atorvastatin (LIPITOR) 80 MG tablet TAKE 1 BY MOUTH DAILY 90 tablet 3  . cilostazol (PLETAL) 100 MG tablet TAKE 1 BY MOUTH TWICE DAILY BEFORE A MEAL 180 tablet 3  . Coenzyme Q10 (EQL COQ10) 300 MG CAPS Take 1 capsule by mouth daily.    . Cyanocobalamin (VITAMIN B 12 PO) Take 1,000 mg by mouth.    . LECITHIN PO Take 1,200 mg by mouth daily.    [name] LORazepam (ATIVAN) 1 MG tablet Take 1 mg by mouth at bedtime.     [name] losartan (COZAAR) 50 MG tablet Take 1 tablet (50 mg total) by mouth daily. 90 tablet 3  . metFORMIN (GLUCOPHAGE) 1000 MG tablet Take 1,000 mg by mouth daily.   0  . metoprolol tartrate (LOPRESSOR) 25 MG tablet Take 1 tablet (25 mg total) by mouth 2 (two) times daily. 180 tablet 1  . Multiple Vitamin (MULITIVITAMIN WITH MINERALS) TABS Take 1 tablet by mouth daily.    [name] omega-3 fish oil (MAXEPA) 1000 MG CAPS capsule Take 1 capsule by mouth daily. Mega Red    . pantoprazole (PROTONIX) 40 MG tablet TAKE 1 BY MOUTH DAILY 90 tablet 1  . Plant Stanol Ester (BENECOL PO) Take by mouth daily.    . sertraline (ZOLOFT) 100 MG tablet 100 mg 2 (two) times daily.   0  No current facility-administered medications for this visit.    Allergies  Allergen Reactions  . Halothane Hives and Other (See Comments)    Gas used 1980's Reaction: affected her liver    Social History   Social History  . Marital Status: Married    Spouse Name: N/A  . Number of Children: N/A  . Years of Education: N/A   Occupational History  . Not on file.   Social History Main Topics  . Smoking status: Former Smoker -- 1.00 packs/day for 30 years    Types: Cigarettes    Quit date:  06/22/2005  . Smokeless tobacco: Never Used  . Alcohol Use: 0.0 oz/week     Comment: 01/17/12 "used to have glass of wine now & then; last wine was ~ 2011"  . Drug Use: No  . Sexual Activity: Yes   Other Topics Concern  . Not on file   Social History Narrative     Review of Systems: General: negative for chills, fever, night sweats or weight changes.  Cardiovascular: negative for chest pain, dyspnea on exertion, edema, orthopnea, palpitations, paroxysmal nocturnal dyspnea or shortness of breath Dermatological: negative for rash Respiratory: negative for cough or wheezing Urologic: negative for hematuria Abdominal: negative for nausea, vomiting, diarrhea, bright red blood per rectum, melena, or hematemesis Neurologic: negative for visual changes, syncope, or dizziness All other systems reviewed and are otherwise negative except as noted above.    Blood pressure 126/78, pulse 79, height 5\' 4"  (1.626 m), weight 187 lb (84.823 kg).  General appearance: alert and no distress Neck: no adenopathy, no carotid bruit, no JVD, supple, symmetrical, trachea midline and thyroid not enlarged, symmetric, no tenderness/mass/nodules Lungs: clear to auscultation bilaterally Heart: regular rate and rhythm, S1, S2 normal, no murmur, click, rub or gallop Extremities: extremities normal, atraumatic, no cyanosis or edema  EKG sinus rhythm at 79 with nonspecific ST and T-wave changes. I personally reviewed this EKG  ASSESSMENT AND PLAN:   Renal artery stenosis, Right, 75% History of an incidentally noted 75% right renal artery stenosis demonstrated during angiography of her lower extremities.  Peripheral artery disease, bilta LE PVD by angio April 2013 History of severe bilateral lower extremity occlusive disease with occluded SFAs as well as calcified profunda femoris with tibial vessel disease. She does have continued lifestyle limiting claudication. I referred her to Dr. Dr. [name] felt that  surgical auscultation was not optimal recommended continued conservative/medical therapy including Pletal and exercise.  HTN (hypertension) History of hypertension blood pressure measured 126/78. She is on metoprolol. Continue current meds at current dosing  HLD (hyperlipidemia) History of hyperlipidemia on atorvastatin followed by her PCP  CAD, cardiac arrest, CPR, CABG in [facility] Dec 2011-NL LVF by Echo, neg Nuc April 2013 History of coronary artery disease status post witnessed sudden cardiac death, CPR, defibrillation and also coronary artery bypass grafting in [facility] 09/27/10. She had prolonged hospitalization and rehabilitation afterwards. Echo performed April 2012 showed normal LV systolic function and Myoview was normal as well. She denies chest pain or shortness of breath.      Lorretta Harp MD FACP,FACC,FAHA, [facility] 08/25/2015 3:42 PM

## 2015-08-25 NOTE — Assessment & Plan Note (Addendum)
History of an incidentally noted 75% right renal artery stenosis demonstrated during angiography of her lower extremities.

## 2015-08-25 NOTE — Assessment & Plan Note (Signed)
History of coronary artery disease status post witnessed sudden cardiac death, CPR, defibrillation and also coronary artery bypass grafting in [facility] 09/27/10. She had prolonged hospitalization and rehabilitation afterwards. Echo performed April 2012 showed normal LV systolic function and Myoview was normal as well. She denies chest pain or shortness of breath.

## 2015-08-25 NOTE — Assessment & Plan Note (Addendum)
History of severe bilateral lower extremity occlusive disease with occluded SFAs as well as calcified profunda femoris with tibial vessel disease. She does have continued lifestyle limiting claudication. I referred her to Dr. Dr. [name] felt that surgical auscultation was not optimal recommended continued conservative/medical therapy including Pletal and exercise.

## 2015-08-25 NOTE — Patient Instructions (Signed)

## 2015-08-25 NOTE — Assessment & Plan Note (Signed)
History of hypertension blood pressure measured 126/78. She is on metoprolol. Continue current meds at current dosing

## 2015-09-03 ENCOUNTER — Other Ambulatory Visit (INDEPENDENT_AMBULATORY_CARE_PROVIDER_SITE_OTHER): Payer: Medicare Other | Admitting: Ophthalmology

## 2015-09-03 DIAGNOSIS — E113312 Type 2 diabetes mellitus with moderate nonproliferative diabetic retinopathy with macular edema, left eye: Secondary | ICD-10-CM | POA: Diagnosis not present

## 2015-09-03 DIAGNOSIS — E11311 Type 2 diabetes mellitus with unspecified diabetic retinopathy with macular edema: Secondary | ICD-10-CM | POA: Diagnosis not present

## 2015-09-16 ENCOUNTER — Encounter: Payer: Self-pay | Admitting: Family

## 2015-09-21 ENCOUNTER — Ambulatory Visit (HOSPITAL_COMMUNITY)
Admission: RE | Admit: 2015-09-21 | Discharge: 2015-09-21 | Disposition: A | Discharge status: Home / Self Care | Payer: Medicare Other | Source: Ambulatory Visit | Attending: Family | Admitting: Family

## 2015-09-21 ENCOUNTER — Ambulatory Visit (INDEPENDENT_AMBULATORY_CARE_PROVIDER_SITE_OTHER): Payer: Medicare Other | Admitting: Family

## 2015-09-21 ENCOUNTER — Encounter: Payer: Self-pay | Admitting: Family

## 2015-09-21 ENCOUNTER — Ambulatory Visit (INDEPENDENT_AMBULATORY_CARE_PROVIDER_SITE_OTHER)
Admission: RE | Admit: 2015-09-21 | Discharge: 2015-09-21 | Disposition: A | Discharge status: Home / Self Care | Payer: Medicare Other | Source: Ambulatory Visit | Attending: Family | Admitting: Family

## 2015-09-21 VITALS — BP 129/83 | HR 73 | Ht 64.0 in | Wt 188.0 lb

## 2015-09-21 DIAGNOSIS — I779 Disorder of arteries and arterioles, unspecified: Secondary | ICD-10-CM

## 2015-09-21 DIAGNOSIS — I6523 Occlusion and stenosis of bilateral carotid arteries: Secondary | ICD-10-CM

## 2015-09-21 DIAGNOSIS — I70209 Unspecified atherosclerosis of native arteries of extremities, unspecified extremity: Secondary | ICD-10-CM

## 2015-09-21 NOTE — Progress Notes (Signed)
[facility] [place] HISTORY AND PHYSICAL   MRN : [id number]  History of Present Illness:   Alicia Crawford is a 59 y.o. female patient of Dr. [name] who is s/p angiography in April, 2014 for bilateral claudication. 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 She also has carotid artery disease. She returns today for follow up. She has a history of hypertension and hyperlipidemia which are medically managed. She had a witnessed episode of cardiac arrest with CPR and defibrillation which eventually led to coronary artery bypass grafting in 2011. During this hospitalization she was told that she had already had a stroke before this, but she does not recall any symptoms of ever having a stroke. She has had mild expressive aphasia since the cardiac arrest. She denies any monocular loss of vision. In 2013 she fell, hit her head, and sustained an intracranial bleed. She states that prior to her craniotomy she was able to walk 1.2 miles without stopping. She states that she would occasionally experience an aching in both of her legs. She would also have aching at night. She had previously tried Pletal but did not tolerate it. Dr. [name] decreased her dose to 50 mg twice a day. She is tolerating this.  She stopped taking Pletal on advice as one or her providers was concerned about the effect Pletal had on her depression, but after a week claudication in her legs became unbearable; she then resumed Pletal.   Patient has not had previous carotid artery intervention.  After walking for about 15 minutes, both calves ache; after she resumes walking she has no further claudication. She denies non healing wounds.  She does walk a mile daily. She does not have rest pain. She also swims 3-4 days/week.  Pt denies ever having DVT. She reports that she has a  history of MRSA connected with a tummy tuck.  Pt reports New Medical or Surgical History: laser surgery to both eyes related to complication of DM.[name]  Pt Diabetic: Yes, states in good control Pt smoker: former smoker, quit 2008  Pt meds include: Statin : Yes ASA: yes, 81 mg daily Other anticoagulants/antiplatelets: cilostazol bid     Current Outpatient Prescriptions  Medication Sig Dispense Refill  . Ascorbic Acid (VITAMIN C) 100 MG tablet Take 100 mg by mouth daily.    [name] atorvastatin (LIPITOR) 80 MG tablet TAKE 1 BY MOUTH DAILY 90 tablet 3  . cilostazol (PLETAL) 100 MG tablet TAKE 1 BY MOUTH TWICE DAILY BEFORE A MEAL 180 tablet 3  . Coenzyme Q10 (EQL COQ10) 300 MG CAPS Take 1 capsule by mouth daily.    . Cyanocobalamin (VITAMIN B 12 PO) Take 1,000 mg by mouth.    . LECITHIN PO Take 1,200 mg by mouth daily.    [name] LORazepam (ATIVAN) 1 MG tablet Take 1 mg by mouth at bedtime.     [name] losartan (COZAAR) 50 MG tablet Take 1 tablet (50 mg total) by mouth daily. 90 tablet 3  . metFORMIN (GLUCOPHAGE) 1000 MG tablet Take 1,000 mg by mouth daily.   0  . metoprolol tartrate (LOPRESSOR) 25 MG tablet Take 1 tablet (25 mg total) by mouth 2 (two) times daily. 180 tablet 1  . Multiple Vitamin (MULITIVITAMIN WITH MINERALS) TABS Take 1 tablet by mouth daily.    [name] omega-3 fish oil (MAXEPA) 1000 MG CAPS capsule Take 1 capsule by mouth daily. Mega  Red    . pantoprazole (PROTONIX) 40 MG tablet TAKE 1 BY MOUTH DAILY 90 tablet 1  . Plant Stanol Ester (BENECOL PO) Take by mouth daily.    . sertraline (ZOLOFT) 100 MG tablet 100 mg 2 (two) times daily.   0   No current facility-administered medications for this visit.    Past Medical History  Diagnosis Date  . Hypertension   . Claudication ([place])   . Anoxic brain injury ([place]) 08/2010    "w/cardiac arrest"  . Rotator cuff tendonitis   . Complication of anesthesia     [place]  . High cholesterol   . Cardiac arrest ([place]) 08/04/2010  . Blood transfusion  09/2010  . Depression   . DEMENTIA   . Coronary artery disease     CABG 2011 in [place]  . Peripheral vascular disease (HCC)     bilat SFA diseae  . Carotid artery occlusion     mild to moderate bilateral internal carotid artery stenosis  . Obesity   . Myocardial infarction [facility]) 09/27/10    in [place]  . Anxiety Jan. 2012  . DVT (deep venous thrombosis) ([place])   . Subdural hematoma [facility]) May 25,2013    Frontal subdural  S/P evacuation of hematoma  . Coronary artery disease   . Stroke [facility]) 2011  . Type II diabetes mellitus (HCC)     Diet and Exercise  . Renal artery stenosis [facility])     Social History Social History  Substance Use Topics  . Smoking status: Former Smoker -- 1.00 packs/day for 30 years    Types: Cigarettes    Quit date: 06/22/2005  . Smokeless tobacco: Never Used  . Alcohol Use: 0.0 oz/week    0 Standard drinks or equivalent per week     Comment: 01/17/12 "used to have glass of wine now & then; last wine was ~ 2011"    Family History Family History  Problem Relation Age of Onset  . Heart disease Other   . Diabetes Other   . Hypertension Other   . Alcohol abuse Other   . Mental illness Other   . Heart disease Mother     before age 1  . Diabetes Mother   . Hyperlipidemia Mother   . Hypertension Mother   . Heart attack Mother   . Heart disease Father     Before age 33  . Hyperlipidemia Father   . Hypertension Father   . Heart attack Father   . Heart disease Sister     Before age 40  . Heart attack Sister   . Hyperlipidemia Brother   . Hypertension Brother   . Heart attack Brother   . Heart disease Brother     before age 75    Surgical History Past Surgical History  Procedure Laterality Date  . Ovarian cyst removal  1983  . Stomach and skin tuck  2000  . Posterior fusion lumbar spine  ` 2000  . Cholecystectomy  ~ 1980's  . Coronary artery bypass graft  09/2010    CABG X4  . Back surgery    . Craniotomy  02/25/2012     Procedure: CRANIOTOMY HEMATOMA EVACUATION SUBDURAL;  Surgeon: Eustace Moore, MD;  Location: [facility]URO ORS;  Service: Neurosurgery;  Laterality: Left;  Left craniotomy for evacuation of subdural hematoma  . Lower extremity angiogram  April 2013  . Spine surgery    . Atherectomy N/A 01/17/2012    Procedure: ATHERECTOMY;  Surgeon: Roderic Palau  Adora Fridge, MD;  Location: [facility] CATH LAB;  Service: Cardiovascular;  Laterality: N/A;    Allergies  Allergen Reactions  . Halothane Hives and Other (See Comments)    Gas used 1980's Reaction: affected her liver    Current Outpatient Prescriptions  Medication Sig Dispense Refill  . Ascorbic Acid (VITAMIN C) 100 MG tablet Take 100 mg by mouth daily.    [name] atorvastatin (LIPITOR) 80 MG tablet TAKE 1 BY MOUTH DAILY 90 tablet 3  . cilostazol (PLETAL) 100 MG tablet TAKE 1 BY MOUTH TWICE DAILY BEFORE A MEAL 180 tablet 3  . Coenzyme Q10 (EQL COQ10) 300 MG CAPS Take 1 capsule by mouth daily.    . Cyanocobalamin (VITAMIN B 12 PO) Take 1,000 mg by mouth.    . LECITHIN PO Take 1,200 mg by mouth daily.    [name] LORazepam (ATIVAN) 1 MG tablet Take 1 mg by mouth at bedtime.     [name] losartan (COZAAR) 50 MG tablet Take 1 tablet (50 mg total) by mouth daily. 90 tablet 3  . metFORMIN (GLUCOPHAGE) 1000 MG tablet Take 1,000 mg by mouth daily.   0  . metoprolol tartrate (LOPRESSOR) 25 MG tablet Take 1 tablet (25 mg total) by mouth 2 (two) times daily. 180 tablet 1  . Multiple Vitamin (MULITIVITAMIN WITH MINERALS) TABS Take 1 tablet by mouth daily.    [name] omega-3 fish oil (MAXEPA) 1000 MG CAPS capsule Take 1 capsule by mouth daily. Mega Red    . pantoprazole (PROTONIX) 40 MG tablet TAKE 1 BY MOUTH DAILY 90 tablet 1  . Plant Stanol Ester (BENECOL PO) Take by mouth daily.    . sertraline (ZOLOFT) 100 MG tablet 100 mg 2 (two) times daily.   0   No current facility-administered medications for this visit.     REVIEW OF SYSTEMS: See HPI for pertinent positives and negatives.  Physical  Examination Filed Vitals:   09/21/15 1320 09/21/15 1322  BP: 127/83 129/83  Pulse: 70 73  Height: 5\' 4"  (1.626 m)   Weight: 188 lb (85.276 kg)   SpO2: 100%    Body mass index is 32.25 kg/(m^2).  General: WDWN obese female in NAD GAIT: normal Eyes: PERRLA Pulmonary: Non-labored, CTAB, no rales, rhonchi, or wheezing.  Cardiac: regular rhythm, no detected murmur.  VASCULAR EXAM Carotid Bruits Left Right   Negative Negative   Aorta is not palpable. Radial pulses are 2+ palpable and equal.      LE Pulses LEFT RIGHT   FEMORAL 2+ palpable 2+ palpable    POPLITEAL not palpable  not palpable   POSTERIOR TIBIAL not palpable  not palpable    DORSALIS PEDIS  ANTERIOR TIBIAL 1+ palpable  not palpable     Gastrointestinal: soft, nontender, BS WNL, no r/g,no palpable masses.  Musculoskeletal: No muscle atrophy/wasting. M/S 5/5 throughout, Extremities without ischemic changes.  Neurologic: A&O X 3; Appropriate Affect, Speech is normal, CN 2-12 intact, Pain and light touch intact in extremities, Motor exam as listed above.                Non-Invasive Vascular Imaging (09/21/2015):  CEREBROVASCULAR DUPLEX EVALUATION    INDICATION: Follow-up carotid disease     PREVIOUS INTERVENTION(S):     DUPLEX EXAM:     RIGHT  LEFT  Peak Systolic Velocities (cm/s) End Diastolic Velocities (cm/s) Plaque LOCATION Peak Systolic Velocities (cm/s) End Diastolic Velocities (cm/s) Plaque  101 17  CCA PROXIMAL 76 17   93 24  CCA MID 80  24   68 20  CCA DISTAL 116 29   305 0 CP ECA 359 0 CP  145 53 CP ICA PROXIMAL 192 60 CP  129 44  ICA MID 62 22   84 31  ICA DISTAL 54 18     1.6 ICA / CCA Ratio (PSV) 2.4  Antegrade  Vertebral Flow Antegrade    Brachial Systolic Pressure (mmHg)    Within normal limits  Subclavian Artery Waveforms Within normal limits     Plaque Morphology:  HM = Homogeneous, HT = Heterogeneous, CP = Calcific Plaque, SP = Smooth Plaque, IP = Irregular Plaque     ADDITIONAL FINDINGS:     IMPRESSION: 1. Evidence of 40%-59% stenosis of the bilateral internal carotid artery. Plaque is calcific and may underestimate velocities. 2. Evidence of >50% stenosis of the bilateral external carotid artery. 3. Bilateral vertebral artery is antegrade.    Compared to the previous exam:  Comparison to previous exam not applicable due to change in diagnostic criteria. Velocities are relatively stable.     ABI (Date: 09/21/2015)  R: 0.87 (0.71, 03/16/15), DP: triphasic, PT: triphasic, TBI: 0.50             L: 0.93 (0.77), DP: triphasic, PT: triphasic, TBI: 0.55    ASSESSMENT:  Harshika Grosshans is a 59 y.o. female who is s/p angiography in April, 2014 for bilateral claudication. 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  She also has carotid artery disease. She was told that she had a stroke at some point before her witnessed cardiac arrest in 2011 which eventually led to a CABG. She has had mild expressive aphasia since the cardiac arrest. She has claudication within a few minutes of walking, but is able to walk a mile daily and swim several times/week. She has no tissue loss. She has walked more since her last visit.  Today's carotid Duplex suggests 40%-59% stenosis of the bilateral internal carotid artery.  No significant change compared to June of 2016.  Bilateral ABI's have improved from moderate arterial occlusive disease to mild and normal with all triphasic waveforms; this significant improvement is attributable  To her sustained efforts at increasing her walking and keeping her DM in good control.     PLAN:   Based on  today's exam and non-invasive vascular lab results, the patient will follow up in 1 year with the following tests: carotid duplex and ABI's. I discussed in depth with the patient the nature of atherosclerosis, and emphasized the importance of maximal medical management including strict control of blood pressure, blood glucose, and lipid levels, obtaining regular exercise, and cessation of smoking.  The patient is aware that without maximal medical management the underlying atherosclerotic disease process will progress, limiting the benefit of any interventions.  The patient was given information about stroke prevention and what symptoms should prompt the patient to seek immediate medical care.  The patient was given information about PAD including signs, symptoms, treatment, what symptoms should prompt the patient to seek immediate medical care, and risk reduction measures to take. Thank you for allowing [place] to participate in this patient's care.  Clemon Chambers, RN, MSN, FNP-C [facility] Office: [phone]  Clinic MD: Kellie Simmering  09/21/2015 1:24 PM

## 2015-09-21 NOTE — Patient Instructions (Signed)
Stroke Prevention Some medical conditions and behaviors are associated with an increased chance of having a stroke. You may prevent a stroke by making healthy choices and managing medical conditions. HOW CAN I REDUCE MY RISK OF HAVING A STROKE?   Stay physically active. Get at least 30 minutes of activity on most or all days.  Do not smoke. It may also be helpful to avoid exposure to secondhand smoke.  Limit alcohol use. Moderate alcohol use is considered to be:  No more than 2 drinks per day for men.  No more than 1 drink per day for nonpregnant women.  Eat healthy foods. This involves:  Eating 5 or more servings of fruits and vegetables a day.  Making dietary changes that address high blood pressure (hypertension), high cholesterol, diabetes, or obesity.  Manage your cholesterol levels.  Making food choices that are high in fiber and low in saturated fat, trans fat, and cholesterol may control cholesterol levels.  Take any prescribed medicines to control cholesterol as directed by your health care provider.  Manage your diabetes.  Controlling your carbohydrate and sugar intake is recommended to manage diabetes.  Take any prescribed medicines to control diabetes as directed by your health care provider.  Control your hypertension.  Making food choices that are low in salt (sodium), saturated fat, trans fat, and cholesterol is recommended to manage hypertension.  Ask your health care provider if you need treatment to lower your blood pressure. Take any prescribed medicines to control hypertension as directed by your health care provider.  If you are 18-39 years of age, have your blood pressure checked every 3-5 years. If you are 40 years of age or older, have your blood pressure checked every year.  Maintain a healthy weight.  Reducing calorie intake and making food choices that are low in sodium, saturated fat, trans fat, and cholesterol are recommended to manage  weight.  Stop drug abuse.  Avoid taking birth control pills.  Talk to your health care provider about the risks of taking birth control pills if you are over 35 years old, smoke, get migraines, or have ever had a blood clot.  Get evaluated for sleep disorders (sleep apnea).  Talk to your health care provider about getting a sleep evaluation if you snore a lot or have excessive sleepiness.  Take medicines only as directed by your health care provider.  For some people, aspirin or blood thinners (anticoagulants) are helpful in reducing the risk of forming abnormal blood clots that can lead to stroke. If you have the irregular heart rhythm of atrial fibrillation, you should be on a blood thinner unless there is a good reason you cannot take them.  Understand all your medicine instructions.  Make sure that other conditions (such as anemia or atherosclerosis) are addressed. SEEK IMMEDIATE MEDICAL CARE IF:   You have sudden weakness or numbness of the face, arm, or leg, especially on one side of the body.  Your face or eyelid droops to one side.  You have sudden confusion.  You have trouble speaking (aphasia) or understanding.  You have sudden trouble seeing in one or both eyes.  You have sudden trouble walking.  You have dizziness.  You have a loss of balance or coordination.  You have a sudden, severe headache with no known cause.  You have new chest pain or an irregular heartbeat. Any of these symptoms may represent a serious problem that is an emergency. Do not wait to see if the symptoms will   go away. Get medical help at once. Call your local emergency services (911 in [place] Do not drive yourself to the hospital.   This information is not intended to replace advice given to you by your health care provider. Make sure you discuss any questions you have with your health care provider.   Document Released: 10/27/2004 Document Revised: 10/10/2014 Document Reviewed:  03/22/2013 Elsevier Interactive Patient Education 2016 Elsevier Inc.    Peripheral Vascular Disease Peripheral vascular disease (PVD) is a disease of the blood vessels that are not part of your heart and brain. A simple term for PVD is poor circulation. In most cases, PVD narrows the blood vessels that carry blood from your heart to the rest of your body. This can result in a decreased supply of blood to your arms, legs, and internal organs, like your stomach or kidneys. However, it most often affects a person's lower legs and feet. There are two types of PVD.  Organic PVD. This is the more common type. It is caused by damage to the structure of blood vessels.  Functional PVD. This is caused by conditions that make blood vessels contract and tighten (spasm). Without treatment, PVD tends to get worse over time. PVD can also lead to acute ischemic limb. This is when an arm or limb suddenly has trouble getting enough blood. This is a medical emergency. CAUSES Each type of PVD has many different causes. The most common cause of PVD is buildup of a fatty material (plaque) inside of your arteries (atherosclerosis). Small amounts of plaque can break off from the walls of the blood vessels and become lodged in a smaller artery. This blocks blood flow and can cause acute ischemic limb. Other common causes of PVD include:  Blood clots that form inside of blood vessels.  Injuries to blood vessels.  Diseases that cause inflammation of blood vessels or cause blood vessel spasms.  Health behaviors and health history that increase your risk of developing PVD. RISK FACTORS  You may have a greater risk of PVD if you:  Have a family history of PVD.  Have certain medical conditions, including:  High cholesterol.  Diabetes.  High blood pressure (hypertension).  Coronary heart disease.  Past problems with blood clots.  Past injury, such as burns or a broken bone. These may have damaged blood  vessels in your limbs.  Buerger disease. This is caused by inflamed blood vessels in your hands and feet.  Some forms of arthritis.  Rare birth defects that affect the arteries in your legs.  Use tobacco.  Do not get enough exercise.  Are obese.  Are age 50 or older. SIGNS AND SYMPTOMS  PVD may cause many different symptoms. Your symptoms depend on what part of your body is not getting enough blood. Some common signs and symptoms include:  Cramps in your lower legs. This may be a symptom of poor leg circulation (claudication).  Pain and weakness in your legs while you are physically active that goes away when you rest (intermittent claudication).  Leg pain when at rest.  Leg numbness, tingling, or weakness.  Coldness in a leg or foot, especially when compared with the other leg.  Skin or hair changes. These can include:  Hair loss.  Shiny skin.  Pale or bluish skin.  Thick toenails.  Inability to get or maintain an erection (erectile dysfunction). People with PVD are more prone to developing ulcers and sores on their toes, feet, or legs. These may take longer than   normal to heal. DIAGNOSIS Your health care provider may diagnose PVD from your signs and symptoms. The health care provider will also do a physical exam. You may have tests to find out what is causing your PVD and determine its severity. Tests may include:  Blood pressure recordings from your arms and legs and measurements of the strength of your pulses (pulse volume recordings).  Imaging studies using sound waves to take pictures of the blood flow through your blood vessels (Doppler ultrasound).  Injecting a dye into your blood vessels before having imaging studies using:  X-rays (angiogram or arteriogram).  Computer-generated X-rays (CT angiogram).  A powerful electromagnetic field and a computer (magnetic resonance angiogram or MRA). TREATMENT Treatment for PVD depends on the cause of your condition  and the severity of your symptoms. It also depends on your age. Underlying causes need to be treated and controlled. These include long-lasting (chronic) conditions, such as diabetes, high cholesterol, and high blood pressure. You may need to first try making lifestyle changes and taking medicines. Surgery may be needed if these do not work. Lifestyle changes may include:  Quitting smoking.  Exercising regularly.  Following a low-fat, low-cholesterol diet. Medicines may include:  Blood thinners to prevent blood clots.  Medicines to improve blood flow.  Medicines to improve your blood cholesterol levels. Surgical procedures may include:  A procedure that uses an inflated balloon to open a blocked artery and improve blood flow (angioplasty).  A procedure to put in a tube (stent) to keep a blocked artery open (stent implant).  Surgery to reroute blood flow around a blocked artery (peripheral bypass surgery).  Surgery to remove dead tissue from an infected wound on the affected limb.  Amputation. This is surgical removal of the affected limb. This may be necessary in cases of acute ischemic limb that are not improved through medical or surgical treatments. HOME CARE INSTRUCTIONS  Take medicines only as directed by your health care provider.  Do not use any tobacco products, including cigarettes, chewing tobacco, or electronic cigarettes. If you need help quitting, ask your health care provider.  Lose weight if you are overweight, and maintain a healthy weight as directed by your health care provider.  Eat a diet that is low in fat and cholesterol. If you need help, ask your health care provider.  Exercise regularly. Ask your health care provider to suggest some good activities for you.  Use compression stockings or other mechanical devices as directed by your health care provider.  Take good care of your feet.  Wear comfortable shoes that fit well.  Check your feet often for  any cuts or sores. SEEK MEDICAL CARE IF:  You have cramps in your legs while walking.  You have leg pain when you are at rest.  You have coldness in a leg or foot.  Your skin changes.  You have erectile dysfunction.  You have cuts or sores on your feet that are not healing. SEEK IMMEDIATE MEDICAL CARE IF:  Your arm or leg turns cold and blue.  Your arms or legs become red, warm, swollen, painful, or numb.  You have chest pain or trouble breathing.  You suddenly have weakness in your face, arm, or leg.  You become very confused or lose the ability to speak.  You suddenly have a very bad headache or lose your vision.   This information is not intended to replace advice given to you by your health care provider. Make sure you discuss any questions   you have with your health care provider.   Document Released: 10/27/2004 Document Revised: 10/10/2014 Document Reviewed: 02/27/2014 Elsevier Interactive Patient Education 2016 Elsevier Inc.  

## 2015-10-16 ENCOUNTER — Telehealth: Payer: Self-pay | Admitting: Cardiovascular Disease

## 2015-10-16 MED ORDER — METOPROLOL TARTRATE 25 MG PO TABS: 25.0000 mg | ORAL_TABLET | Freq: Two times a day (BID) | ORAL | Status: DC

## 2015-10-16 MED ORDER — ATORVASTATIN CALCIUM 80 MG PO TABS: ORAL_TABLET | ORAL | Status: DC

## 2015-10-16 MED ORDER — PANTOPRAZOLE SODIUM 40 MG PO TBEC: DELAYED_RELEASE_TABLET | ORAL | Status: DC

## 2015-10-16 MED ORDER — CILOSTAZOL 100 MG PO TABS: ORAL_TABLET | ORAL | Status: DC

## 2015-10-16 NOTE — Telephone Encounter (Signed)
Metoprolol 25mg  , Atorvastatin 80mg  ,Cilostazol100mg  and Pantoprazole 40mg    Refills for 90 days x3 Sent to [facility] mail order Called and made patient aware

## 2015-10-16 NOTE — Telephone Encounter (Signed)
°*  STAT* If patient is at the pharmacy, call can be transferred to refill team.   1. Which medications need to be refilled? (please list name of each medication and dose if known) Metoprolol 25mg  , Atorvastatin 80mg  ,Cilostazol100mg  and Pantoprazole 40mg    2. Which pharmacy/location (including street and city if local pharmacy) [place] [id number]  3. Do they need a 30 day or 90 day supply? 9  Needing new Prescriptions sent to the pharmacy

## 2015-10-21 ENCOUNTER — Telehealth: Payer: Self-pay | Admitting: Cardiovascular Disease

## 2015-10-21 MED ORDER — LOSARTAN POTASSIUM 50 MG PO TABS: 50.0000 mg | ORAL_TABLET | Freq: Every day | ORAL | Status: DC

## 2015-10-21 NOTE — Telephone Encounter (Signed)
°*  STAT* If patient is at the pharmacy, call can be transferred to refill team.   1. Which medications need to be refilled? (please list name of each medication and dose if known) Lisinopril  2. Which pharmacy/location (including street and city if local pharmacy) is medication to be sent to?[phone]  3. Do they need a 30 day or 90 day supply? 90 and refills

## 2015-10-21 NOTE — Telephone Encounter (Signed)
Losartan ( not lisinopril) refill sent to [facility]

## 2015-11-05 DIAGNOSIS — F419 Anxiety disorder, unspecified: Secondary | ICD-10-CM | POA: Diagnosis not present

## 2015-11-24 DIAGNOSIS — R351 Nocturia: Secondary | ICD-10-CM | POA: Diagnosis not present

## 2015-11-24 DIAGNOSIS — E669 Obesity, unspecified: Secondary | ICD-10-CM | POA: Diagnosis not present

## 2015-11-24 DIAGNOSIS — I6350 Cerebral infarction due to unspecified occlusion or stenosis of unspecified cerebral artery: Secondary | ICD-10-CM | POA: Diagnosis not present

## 2015-11-24 DIAGNOSIS — G4733 Obstructive sleep apnea (adult) (pediatric): Secondary | ICD-10-CM | POA: Diagnosis not present

## 2015-12-23 DIAGNOSIS — G4733 Obstructive sleep apnea (adult) (pediatric): Secondary | ICD-10-CM | POA: Diagnosis not present

## 2015-12-23 DIAGNOSIS — I6350 Cerebral infarction due to unspecified occlusion or stenosis of unspecified cerebral artery: Secondary | ICD-10-CM | POA: Diagnosis not present

## 2015-12-23 DIAGNOSIS — R351 Nocturia: Secondary | ICD-10-CM | POA: Diagnosis not present

## 2015-12-23 DIAGNOSIS — E669 Obesity, unspecified: Secondary | ICD-10-CM | POA: Diagnosis not present

## 2015-12-28 DIAGNOSIS — I10 Essential (primary) hypertension: Secondary | ICD-10-CM | POA: Diagnosis not present

## 2015-12-28 DIAGNOSIS — E119 Type 2 diabetes mellitus without complications: Secondary | ICD-10-CM | POA: Diagnosis not present

## 2015-12-28 DIAGNOSIS — E785 Hyperlipidemia, unspecified: Secondary | ICD-10-CM | POA: Diagnosis not present

## 2015-12-28 DIAGNOSIS — G4733 Obstructive sleep apnea (adult) (pediatric): Secondary | ICD-10-CM | POA: Diagnosis not present

## 2016-01-04 ENCOUNTER — Ambulatory Visit (INDEPENDENT_AMBULATORY_CARE_PROVIDER_SITE_OTHER): Payer: PPO | Admitting: Ophthalmology

## 2016-01-04 DIAGNOSIS — H43813 Vitreous degeneration, bilateral: Secondary | ICD-10-CM | POA: Diagnosis not present

## 2016-01-04 DIAGNOSIS — E11319 Type 2 diabetes mellitus with unspecified diabetic retinopathy without macular edema: Secondary | ICD-10-CM

## 2016-01-04 DIAGNOSIS — H33301 Unspecified retinal break, right eye: Secondary | ICD-10-CM | POA: Diagnosis not present

## 2016-01-04 DIAGNOSIS — H2513 Age-related nuclear cataract, bilateral: Secondary | ICD-10-CM | POA: Diagnosis not present

## 2016-01-04 DIAGNOSIS — I10 Essential (primary) hypertension: Secondary | ICD-10-CM | POA: Diagnosis not present

## 2016-01-04 DIAGNOSIS — E113393 Type 2 diabetes mellitus with moderate nonproliferative diabetic retinopathy without macular edema, bilateral: Secondary | ICD-10-CM

## 2016-01-04 DIAGNOSIS — H35033 Hypertensive retinopathy, bilateral: Secondary | ICD-10-CM

## 2016-01-20 DIAGNOSIS — G4733 Obstructive sleep apnea (adult) (pediatric): Secondary | ICD-10-CM | POA: Diagnosis not present

## 2016-02-19 DIAGNOSIS — G4733 Obstructive sleep apnea (adult) (pediatric): Secondary | ICD-10-CM | POA: Diagnosis not present

## 2016-03-21 DIAGNOSIS — G4733 Obstructive sleep apnea (adult) (pediatric): Secondary | ICD-10-CM | POA: Diagnosis not present

## 2016-03-31 DIAGNOSIS — I10 Essential (primary) hypertension: Secondary | ICD-10-CM | POA: Diagnosis not present

## 2016-03-31 DIAGNOSIS — Z7984 Long term (current) use of oral hypoglycemic drugs: Secondary | ICD-10-CM | POA: Diagnosis not present

## 2016-03-31 DIAGNOSIS — E119 Type 2 diabetes mellitus without complications: Secondary | ICD-10-CM | POA: Diagnosis not present

## 2016-03-31 DIAGNOSIS — Z6832 Body mass index (BMI) 32.0-32.9, adult: Secondary | ICD-10-CM | POA: Diagnosis not present

## 2016-03-31 DIAGNOSIS — E785 Hyperlipidemia, unspecified: Secondary | ICD-10-CM | POA: Diagnosis not present

## 2016-04-20 DIAGNOSIS — G4733 Obstructive sleep apnea (adult) (pediatric): Secondary | ICD-10-CM | POA: Diagnosis not present

## 2016-04-26 DIAGNOSIS — F419 Anxiety disorder, unspecified: Secondary | ICD-10-CM | POA: Diagnosis not present

## 2016-05-03 DIAGNOSIS — G4733 Obstructive sleep apnea (adult) (pediatric): Secondary | ICD-10-CM | POA: Diagnosis not present

## 2016-05-21 DIAGNOSIS — G4733 Obstructive sleep apnea (adult) (pediatric): Secondary | ICD-10-CM | POA: Diagnosis not present

## 2016-05-27 ENCOUNTER — Other Ambulatory Visit: Payer: Self-pay | Admitting: *Deleted

## 2016-05-27 MED ORDER — PANTOPRAZOLE SODIUM 40 MG PO TBEC: DELAYED_RELEASE_TABLET | ORAL | 0 refills | Status: DC

## 2016-06-20 DIAGNOSIS — E785 Hyperlipidemia, unspecified: Secondary | ICD-10-CM | POA: Diagnosis not present

## 2016-06-20 DIAGNOSIS — Z0000 Encounter for general adult medical examination without abnormal findings: Secondary | ICD-10-CM | POA: Diagnosis not present

## 2016-06-20 DIAGNOSIS — Z7984 Long term (current) use of oral hypoglycemic drugs: Secondary | ICD-10-CM | POA: Diagnosis not present

## 2016-06-20 DIAGNOSIS — Z23 Encounter for immunization: Secondary | ICD-10-CM | POA: Diagnosis not present

## 2016-06-20 DIAGNOSIS — I10 Essential (primary) hypertension: Secondary | ICD-10-CM | POA: Diagnosis not present

## 2016-06-20 DIAGNOSIS — E119 Type 2 diabetes mellitus without complications: Secondary | ICD-10-CM | POA: Diagnosis not present

## 2016-06-21 DIAGNOSIS — G4733 Obstructive sleep apnea (adult) (pediatric): Secondary | ICD-10-CM | POA: Diagnosis not present

## 2016-07-05 DIAGNOSIS — N812 Incomplete uterovaginal prolapse: Secondary | ICD-10-CM | POA: Diagnosis not present

## 2016-07-21 DIAGNOSIS — G4733 Obstructive sleep apnea (adult) (pediatric): Secondary | ICD-10-CM | POA: Diagnosis not present

## 2016-08-04 ENCOUNTER — Other Ambulatory Visit: Payer: Self-pay

## 2016-08-04 ENCOUNTER — Telehealth: Payer: Self-pay | Admitting: Cardiovascular Disease

## 2016-08-04 MED ORDER — PANTOPRAZOLE SODIUM 40 MG PO TBEC: DELAYED_RELEASE_TABLET | ORAL | 0 refills | Status: DC

## 2016-08-04 NOTE — Telephone Encounter (Signed)
New message   *STAT* If patient is at the pharmacy, call can be transferred to refill team.   1. Which medications need to be refilled? (please list name of each medication and dose if known) Pantoprazole 40mg    2. Which pharmacy/location (including street and city if local pharmacy) is medication to be sent to? [facility] on [address]  3. Do they need a 30 day or 90 day supply? 90 day supply

## 2016-08-11 DIAGNOSIS — N814 Uterovaginal prolapse, unspecified: Secondary | ICD-10-CM | POA: Diagnosis not present

## 2016-08-11 DIAGNOSIS — N812 Incomplete uterovaginal prolapse: Secondary | ICD-10-CM | POA: Diagnosis not present

## 2016-08-11 DIAGNOSIS — N3946 Mixed incontinence: Secondary | ICD-10-CM | POA: Diagnosis not present

## 2016-08-17 ENCOUNTER — Encounter: Payer: Self-pay | Admitting: Cardiovascular Disease

## 2016-08-17 ENCOUNTER — Ambulatory Visit (INDEPENDENT_AMBULATORY_CARE_PROVIDER_SITE_OTHER): Payer: PPO | Admitting: Cardiovascular Disease

## 2016-08-17 DIAGNOSIS — I739 Peripheral vascular disease, unspecified: Secondary | ICD-10-CM | POA: Diagnosis not present

## 2016-08-17 DIAGNOSIS — I10 Essential (primary) hypertension: Secondary | ICD-10-CM | POA: Diagnosis not present

## 2016-08-17 DIAGNOSIS — I701 Atherosclerosis of renal artery: Secondary | ICD-10-CM

## 2016-08-17 DIAGNOSIS — I2510 Atherosclerotic heart disease of native coronary artery without angina pectoris: Secondary | ICD-10-CM

## 2016-08-17 DIAGNOSIS — E7800 Pure hypercholesterolemia, unspecified: Secondary | ICD-10-CM

## 2016-08-17 NOTE — Patient Instructions (Signed)

## 2016-08-17 NOTE — Assessment & Plan Note (Signed)
History of peripheral arterial disease status post angiography by myself 01/17/12 revealing occluded SFAs bilaterally with two-vessel runoff on the right and one on the left. Peroneal. She did have a 95% calcified ostial profunda femoris stenosis on the right jeopardizing collaterals to her infrapopliteal vessel as well as a 60% distal left common femoral artery stenosis. She also had an incidentally noted 35% right renal artery stenosis. I did review the films with Dr. [name] felt surgical revascularization was optimal at that time however since that time she has been treated medically. She does have some claudication which is not lifestyle limiting.

## 2016-08-17 NOTE — Assessment & Plan Note (Signed)
History of hyperlipidemia on statin therapy followed by her PCP. 

## 2016-08-17 NOTE — Progress Notes (Signed)
08/17/2016 Alicia Crawford   07/20/1956  [id number]  Primary Physician Eunice Blase, MD Primary Cardiologist: Lorretta Harp MD [name]  HPI:    The patient is a 60 year old, moderately overweight, married Caucasian female, mother of 3 who I last saw in the office 08/25/15.[name] She has a history of remote tobacco abuse having quit 10 years ago, hypertension, hyperlipidemia and a family history of heart disease with both mother and father who had CAD and a brother who had a stent. She had an episode of witnessed cardiac arrest, CPR, defibrillation and ultimately coronary artery bypass grafting in [facility] September 27, 2010. She had prolonged hospitalization on a ventilator and rehab. Echo performed April 2012 showed normal LV systolic function. A Myoview was normal as well. She otherwise is asymptomatic except for lifestyle-limiting claudication. Dopplers performed in our office December 06, 2011, revealed ABIs of 0.46 on the right with an occluded SFA and high-grade distal right common, profunda stenosis, left ABI of 0.62 with an occluded left SFA. I angiogram'd her January 17, 2012, demonstrating occluded SFAs bilaterally with 2-vessel runoff on the right, 1 on the left via peroneal. She did have a 95% calcified ostial profunda femoris stenosis on the right jeopardizing collaterals to her infrapopliteal vessels as well as a 60% distal left common femoral artery stenosis. She also had an incidentally noted 35% right renal artery stenosis. I reviewed the films with Dr. [name] who felt surgical revascularization was optimal. She in the interim developed a subdural hematoma after falling and hitting her head and her workup was put on hold. Today in the office she continues to complain of lifestyle-limiting claudication. She apparently saw Dr. [name] who recommended continued medical/conservative therapy. Since I saw her back in the office one year ago she's remained completely  asymptomatic. She denies chest pain or shortness of breath. She does complain of some claudication symptoms but this does not appear to be lifestyle limiting at this time.  Current Outpatient Prescriptions  Medication Sig Dispense Refill  . Ascorbic Acid (VITAMIN C) 100 MG tablet Take 100 mg by mouth daily.    [name] atorvastatin (LIPITOR) 80 MG tablet TAKE 1 BY MOUTH DAILY 90 tablet 3  . cilostazol (PLETAL) 100 MG tablet TAKE 1 BY MOUTH TWICE DAILY BEFORE A MEAL 180 tablet 3  . Coenzyme Q10 (EQL COQ10) 300 MG CAPS Take 1 capsule by mouth daily.    . Cyanocobalamin (VITAMIN B 12 PO) Take 1,000 mg by mouth.    . LECITHIN PO Take 1,200 mg by mouth daily.    [name] losartan (COZAAR) 50 MG tablet Take 1 tablet (50 mg total) by mouth daily. 90 tablet 3  . metFORMIN (GLUCOPHAGE) 1000 MG tablet Take 1,000 mg by mouth daily.   0  . metoprolol tartrate (LOPRESSOR) 25 MG tablet Take 1 tablet (25 mg total) by mouth 2 (two) times daily. 180 tablet 3  . Multiple Vitamin (MULITIVITAMIN WITH MINERALS) TABS Take 1 tablet by mouth daily.    [name] omega-3 fish oil (MAXEPA) 1000 MG CAPS capsule Take 1 capsule by mouth daily. Mega Red    . pantoprazole (PROTONIX) 40 MG tablet TAKE 1 BY MOUTH DAILY 90 tablet 0  . Plant Stanol Ester (BENECOL PO) Take by mouth daily.    . sertraline (ZOLOFT) 100 MG tablet Take by mouth daily. Pt takes 50mg      No current facility-administered medications for this visit.     Allergies  Allergen Reactions  .  Halothane Hives and Other (See Comments)    Gas used 1980's Reaction: affected her liver    Social History   Social History  . Marital status: Married    Spouse name: N/A  . Number of children: N/A  . Years of education: N/A   Occupational History  . Not on file.   Social History Main Topics  . Smoking status: Former Smoker    Packs/day: 1.00    Years: 30.00    Types: Cigarettes    Quit date: 06/22/2005  . Smokeless tobacco: Never Used  . Alcohol use 0.0 oz/week      Comment: 01/17/12 "used to have glass of wine now & then; last wine was ~ 2011"  . Drug use: No  . Sexual activity: Yes   Other Topics Concern  . Not on file   Social History Narrative  . No narrative on file     Review of Systems: General: negative for chills, fever, night sweats or weight changes.  Cardiovascular: negative for chest pain, dyspnea on exertion, edema, orthopnea, palpitations, paroxysmal nocturnal dyspnea or shortness of breath Dermatological: negative for rash Respiratory: negative for cough or wheezing Urologic: negative for hematuria Abdominal: negative for nausea, vomiting, diarrhea, bright red blood per rectum, melena, or hematemesis Neurologic: negative for visual changes, syncope, or dizziness All other systems reviewed and are otherwise negative except as noted above.    Blood pressure 126/88, pulse 79, height 5\' 4"  (1.626 m), weight 193 lb 12.8 oz (87.9 kg).  General appearance: alert and no distress Neck: no adenopathy, no carotid bruit, no JVD, supple, symmetrical, trachea midline and thyroid not enlarged, symmetric, no tenderness/mass/nodules Lungs: clear to auscultation bilaterally Heart: regular rate and rhythm, S1, S2 normal, no murmur, click, rub or gallop Extremities: extremities normal, atraumatic, no cyanosis or edema  EKG sinus rhythm at 79 with nonspecific ST and T-wave changes. I personally reviewed this EKG  ASSESSMENT AND PLAN:   Peripheral artery disease, bilta LE PVD by angio April 2013 History of peripheral arterial disease status post angiography by myself 01/17/12 revealing occluded SFAs bilaterally with two-vessel runoff on the right and one on the left. Peroneal. She did have a 95% calcified ostial profunda femoris stenosis on the right jeopardizing collaterals to her infrapopliteal vessel as well as a 60% distal left common femoral artery stenosis. She also had an incidentally noted 35% right renal artery stenosis. I did review the  films with Dr. [name] felt surgical revascularization was optimal at that time however since that time she has been treated medically. She does have some claudication which is not lifestyle limiting.  HLD (hyperlipidemia) History of hyperlipidemia on statin therapy followed by her PCP  HTN (hypertension) History of hypertension blood pressure measured at 126/88. She is on losartan and metoprolol. Continue current meds at current dosing  Renal artery stenosis, Right, 75% History of 75% right renal artery stenosis demonstrated during the time of angiography.  CAD, cardiac arrest, CPR, CABG in [facility] Dec 2011-NL LVF by Echo, neg Nuc April 2013 History of CAD status post witnessed cardiac arrest, CPR, defibrillation and ultimately coronary artery bypass grafting in [facility] 09/27/10. She did have normal LV function by 2-D echo and a Myoview that was normal as well. She is recovered completely and is otherwise asymptomatic.  Occlusion and stenosis of carotid artery without mention of cerebral infarction History of moderate bilateral ICA stenosis followed by [facility]      Lorretta Harp MD [facility], [facility] 08/17/2016 2:40  PM

## 2016-08-17 NOTE — Assessment & Plan Note (Signed)
History of 75% right renal artery stenosis demonstrated during the time of angiography.

## 2016-08-17 NOTE — Assessment & Plan Note (Signed)
History of CAD status post witnessed cardiac arrest, CPR, defibrillation and ultimately coronary artery bypass grafting in [facility] 09/27/10. She did have normal LV function by 2-D echo and a Myoview that was normal as well. She is recovered completely and is otherwise asymptomatic.

## 2016-08-17 NOTE — Assessment & Plan Note (Signed)
History of moderate bilateral ICA stenosis followed by [facility]

## 2016-08-17 NOTE — Assessment & Plan Note (Signed)
History of hypertension blood pressure measured at 126/88. She is on losartan and metoprolol. Continue current meds at current dosing

## 2016-08-21 DIAGNOSIS — G4733 Obstructive sleep apnea (adult) (pediatric): Secondary | ICD-10-CM | POA: Diagnosis not present

## 2016-08-23 DIAGNOSIS — G4733 Obstructive sleep apnea (adult) (pediatric): Secondary | ICD-10-CM | POA: Diagnosis not present

## 2016-09-05 ENCOUNTER — Ambulatory Visit (HOSPITAL_COMMUNITY)
Admission: EM | Admit: 2016-09-05 | Discharge: 2016-09-05 | Disposition: A | Discharge status: Home / Self Care | Payer: PPO | Attending: Family Medicine | Admitting: Family Medicine

## 2016-09-05 ENCOUNTER — Ambulatory Visit (INDEPENDENT_AMBULATORY_CARE_PROVIDER_SITE_OTHER): Payer: PPO

## 2016-09-05 ENCOUNTER — Encounter (HOSPITAL_COMMUNITY): Payer: Self-pay | Admitting: Family Medicine

## 2016-09-05 DIAGNOSIS — S82401A Unspecified fracture of shaft of right fibula, initial encounter for closed fracture: Secondary | ICD-10-CM | POA: Diagnosis not present

## 2016-09-05 DIAGNOSIS — S93401A Sprain of unspecified ligament of right ankle, initial encounter: Secondary | ICD-10-CM | POA: Diagnosis not present

## 2016-09-05 DIAGNOSIS — T148XXA Other injury of unspecified body region, initial encounter: Secondary | ICD-10-CM | POA: Diagnosis not present

## 2016-09-05 DIAGNOSIS — S82899A Other fracture of unspecified lower leg, initial encounter for closed fracture: Secondary | ICD-10-CM

## 2016-09-05 DIAGNOSIS — S82831A Other fracture of upper and lower end of right fibula, initial encounter for closed fracture: Secondary | ICD-10-CM | POA: Diagnosis not present

## 2016-09-05 HISTORY — DX: Other fracture of unspecified lower leg, initial encounter for closed fracture: S82.899A

## 2016-09-05 MED ORDER — TRAMADOL HCL 50 MG PO TABS
50.0000 mg | ORAL_TABLET | Freq: Four times a day (QID) | ORAL | 0 refills | Status: DC | PRN
Start: 2016-09-05 — End: 2017-12-26

## 2016-09-05 NOTE — Discharge Instructions (Signed)
You have a small fracture on the outside of your ankle. This will take about 3 weeks to heal. During this time he should wear the splint that we have provided and you need to follow-up with your prior primary care doctor in 2 weeks for further evaluation. He may return here if pain worsens. Expect the swelling to go down very slowly over the next month.

## 2016-09-05 NOTE — ED Provider Notes (Signed)
[place]    CSN: [id number] Arrival date & time: 09/05/16  [id number]     History   Chief Complaint Chief Complaint  Patient presents with  . Fall    HPI Alicia Crawford is a 60 y.o. female.   This is a 60 year old woman with medical problems including diabetes and peripheral artery disease who was raking her leaves 2 days ago and tripped on a fine. She scraped her face, and toward a brief nosebleed, and twisted her right ankle. She continued to have problems with the right ankle.  The nosebleed stopped on its own. She had no loss of consciousness. She denies pain in her chest or arms or difficulty breathing.      Past Medical History:  Diagnosis Date  . Anoxic brain injury ([place]) 08/2010   "w/cardiac arrest"  . Anxiety Jan. 2012  . Blood transfusion 09/2010  . Cardiac arrest ([place]) 08/04/2010  . Carotid artery occlusion    mild to moderate bilateral internal carotid artery stenosis  . Claudication ([place])   . Complication of anesthesia    [place]  . Coronary artery disease    CABG 2011 in [place]  . Coronary artery disease   . DEMENTIA   . Depression   . DVT (deep venous thrombosis) ([place])   . High cholesterol   . Hypertension   . Myocardial infarction 09/27/10   in [place]  . Obesity   . Peripheral vascular disease (HCC)    bilat SFA diseae  . Renal artery stenosis ([place])   . Rotator cuff tendonitis   . Stroke [facility]) 2011  . Subdural hematoma [facility]) May 25,2013   Frontal subdural  S/P evacuation of hematoma  . Type II diabetes mellitus (HCC)    Diet and Exercise    Patient Active Problem List   Diagnosis Date Noted  . Peripheral vascular disease, unspecified 03/14/2014  . Aftercare following surgery of the circulatory system, [place] 03/14/2014  . Occlusion and stenosis of carotid artery without mention of cerebral infarction 02/11/2013  . SDH (subdural hematoma) ([place]) 03/02/2012  . S/P craniotomy, 02/25/12 after fall, SDH 02/27/2012  .  Bradycardia 02/27/2012  . Obesity 02/27/2012  . Diabetes mellitus 02/27/2012  . CAD, cardiac arrest, CPR, CABG [facility] Dec 2011-NL LVF by Echo, neg Nuc April 2013 02/27/2012  . Anxiety, ? residual hypoxic brain inhury from cardiac arrest 2011 02/27/2012  . Peripheral artery disease, bilta LE PVD by angio April 2013 01/18/2012  . HLD (hyperlipidemia) 01/18/2012  . HTN (hypertension) 01/18/2012  . Renal artery stenosis, Right, 75% 01/18/2012    Past Surgical History:  Procedure Laterality Date  . ATHERECTOMY N/A 01/17/2012   Procedure: ATHERECTOMY;  Surgeon: Lorretta Harp, MD;  Location: [facility] CATH LAB;  Service: Cardiovascular;  Laterality: N/A;  . BACK SURGERY    . CHOLECYSTECTOMY  ~ 1980's  . CORONARY ARTERY BYPASS GRAFT  09/2010   CABG X4  . CRANIOTOMY  02/25/2012   Procedure: CRANIOTOMY HEMATOMA EVACUATION SUBDURAL;  Surgeon: Eustace Moore, MD;  Location: [place] NEURO ORS;  Service: Neurosurgery;  Laterality: Left;  Left craniotomy for evacuation of subdural hematoma  . LOWER EXTREMITY ANGIOGRAM  April 2013  . OVARIAN CYST REMOVAL  1983  . POSTERIOR FUSION LUMBAR SPINE  ` 2000  . SPINE SURGERY    . stomach and skin tuck  2000    OB History    No data available       Home Medications  Prior to Admission medications   Medication Sig Start Date End Date Taking? Authorizing Provider  Ascorbic Acid (VITAMIN C) 100 MG tablet Take 100 mg by mouth daily.    Historical Provider, MD  atorvastatin (LIPITOR) 80 MG tablet TAKE 1 BY MOUTH DAILY 10/16/15   Lorretta Harp, MD  cilostazol (PLETAL) 100 MG tablet TAKE 1 BY MOUTH TWICE DAILY BEFORE A MEAL 10/16/15   Lorretta Harp, MD  Coenzyme Q10 (EQL COQ10) 300 MG CAPS Take 1 capsule by mouth daily.    Historical Provider, MD  Cyanocobalamin (VITAMIN B 12 PO) Take 1,000 mg by mouth.    Historical Provider, MD  LECITHIN PO Take 1,200 mg by mouth daily.    Historical Provider, MD  losartan (COZAAR) 50 MG tablet Take 1 tablet (50 mg  total) by mouth daily. 10/21/15   Lorretta Harp, MD  metFORMIN (GLUCOPHAGE) 1000 MG tablet Take 1,000 mg by mouth daily.  01/05/15   Historical Provider, MD  metoprolol tartrate (LOPRESSOR) 25 MG tablet Take 1 tablet (25 mg total) by mouth 2 (two) times daily. 10/16/15   Lorretta Harp, MD  Multiple Vitamin (MULITIVITAMIN WITH MINERALS) TABS Take 1 tablet by mouth daily.    Historical Provider, MD  omega-3 fish oil (MAXEPA) 1000 MG CAPS capsule Take 1 capsule by mouth daily. Mega Red    Historical Provider, MD  pantoprazole (PROTONIX) 40 MG tablet TAKE 1 BY MOUTH DAILY 08/04/16   Lorretta Harp, MD  Plant Stanol Ester (BENECOL PO) Take by mouth daily.    Historical Provider, MD  sertraline (ZOLOFT) 100 MG tablet Take by mouth daily. Pt takes 50mg     Historical Provider, MD  traMADol (ULTRAM) 50 MG tablet Take 1 tablet (50 mg total) by mouth every 6 (six) hours as needed. 09/05/16   Robyn Haber, MD    Family History Family History  Problem Relation Age of Onset  . Heart disease Mother     before age 66  . Diabetes Mother   . Hyperlipidemia Mother   . Hypertension Mother   . Heart attack Mother   . Heart disease Father     Before age 17  . Hyperlipidemia Father   . Hypertension Father   . Heart attack Father   . Heart disease Sister     Before age 25  . Heart attack Sister   . Hyperlipidemia Brother   . Hypertension Brother   . Heart attack Brother   . Heart disease Brother     before age 74  . Heart disease Other   . Diabetes Other   . Hypertension Other   . Alcohol abuse Other   . Mental illness Other     Social History Social History  Substance Use Topics  . Smoking status: Former Smoker    Packs/day: 1.00    Years: 30.00    Types: Cigarettes    Quit date: 06/22/2005  . Smokeless tobacco: Never Used  . Alcohol use 0.0 oz/week     Comment: 01/17/12 "used to have glass of wine now & then; last wine was ~ 2011"     Allergies   Haloth[place]   Review of  Systems Review of Systems  Constitutional: Negative.   HENT: Positive for dental problem and nosebleeds. Negative for congestion.   Eyes: Negative.   Respiratory: Negative.   Cardiovascular: Negative.   Gastrointestinal: Negative.   Musculoskeletal: Positive for joint swelling.  Skin: Positive for rash and wound.  Neurological: Negative.  Physical Exam Triage Vital Signs ED Triage Vitals [09/05/16 1011]  Enc Vitals Group     BP (!) 166/102     Pulse Rate 73     Resp 16     Temp 97.9 F (36.6 C)     Temp Source Oral     SpO2 100 %     Weight      Height      Head Circumference      Peak Flow      Pain Score      Pain Loc      Pain Edu?      Excl. in [place]?    No data found.   Updated Vital Signs BP (!) 166/102 (BP Location: Right Arm)   Pulse 73   Temp 97.9 F (36.6 C) (Oral)   Resp 16   SpO2 100%    Physical Exam  Constitutional: She appears well-developed and well-nourished.  HENT:  Right Ear: External ear normal.  Left Ear: External ear normal.  Mouth/Throat: Oropharynx is clear and moist.  Patient has a superficial abrasion over most of her nose. There is no active bleeding from the nasal passages.  Patient has some numbness in tooth #9  Eyes: Conjunctivae and EOM are normal.  Neck: Normal range of motion. Neck supple.  Musculoskeletal: She exhibits tenderness. She exhibits no deformity.  The right ankle is diffusely swollen with pain on the medial malleolus. There is no pain with palpation of the fifth metatarsal.  Skin: Skin is warm and dry.  Patient has diffuse abrasion which is dry over the bridge of her nose. She also has a very superficial ulceration on her upper left lip  Psychiatric: She has a normal mood and affect. Her behavior is normal. Judgment and thought content normal.  Nursing note and vitals reviewed.    UC Treatments / Results  Labs (all labs ordered are listed, but only abnormal results are displayed) Labs Reviewed - No data  to display  EKG  EKG Interpretation None       Radiology Dg Ankle Complete Right  Result Date: 09/05/2016 CLINICAL DATA:  Right ankle pain, injury 2 days ago EXAM: RIGHT ANKLE - COMPLETE 3+ VIEW COMPARISON:  None. FINDINGS: Three views of the right ankle submitted. There is nondisplaced fracture in distal fibula with alignment preserved. Ankle mortise is preserved. Plantar spur of calcaneus. Soft tissue swelling is noted adjacent to lateral malleolus. IMPRESSION: Nondisplaced fracture in distal fibula with alignment preserved. Lateral soft tissue swelling. Electronically Signed   By: Lahoma Crocker M.D.   On: 09/05/2016 10:34    Procedures Procedures (including critical care time)  Medications Ordered in UC Medications - No data to display   Initial Impression / Assessment and Plan / UC Course  I have reviewed the triage vital signs and the nursing notes.  Pertinent labs & imaging results that were available during my care of the patient were reviewed by me and considered in my medical decision making (see chart for details).  Clinical Course      Final Clinical Impressions(s) / UC Diagnoses   Final diagnoses:  Abrasion  Moderate right ankle sprain, initial encounter  Closed fracture of shaft of right fibula, unspecified fracture morphology, initial encounter    New Prescriptions New Prescriptions   TRAMADOL (ULTRAM) 50 MG TABLET    Take 1 tablet (50 mg total) by mouth every 6 (six) hours as needed.     Robyn Haber, MD 09/05/16 1051

## 2016-09-05 NOTE — ED Triage Notes (Signed)
Pt  Reports    She fell  2  Days  Ago   -    Lac  To  Nose          Pain  r     Ankle       Had  Nosebleed  Initially  However  Subsided        Pt  Did  Not black  Out      Did  Not   Hit  Head        Swelling  And  Bruising to  r  Ankle

## 2016-09-09 DIAGNOSIS — S82899A Other fracture of unspecified lower leg, initial encounter for closed fracture: Secondary | ICD-10-CM | POA: Diagnosis not present

## 2016-09-09 DIAGNOSIS — W19XXXA Unspecified fall, initial encounter: Secondary | ICD-10-CM | POA: Diagnosis not present

## 2016-09-09 DIAGNOSIS — S0031XA Abrasion of nose, initial encounter: Secondary | ICD-10-CM | POA: Diagnosis not present

## 2016-09-13 ENCOUNTER — Encounter: Payer: Self-pay | Admitting: Family

## 2016-09-19 ENCOUNTER — Ambulatory Visit (INDEPENDENT_AMBULATORY_CARE_PROVIDER_SITE_OTHER): Payer: PPO | Admitting: Family

## 2016-09-19 ENCOUNTER — Ambulatory Visit (HOSPITAL_COMMUNITY)
Admission: RE | Admit: 2016-09-19 | Discharge: 2016-09-19 | Disposition: A | Discharge status: Home / Self Care | Payer: PPO | Source: Ambulatory Visit | Attending: Family | Admitting: Family

## 2016-09-19 ENCOUNTER — Encounter: Payer: Self-pay | Admitting: Family

## 2016-09-19 VITALS — BP 131/82 | HR 81 | Temp 98.2°F | Resp 18 | Ht 64.0 in | Wt 190.0 lb

## 2016-09-19 DIAGNOSIS — I779 Disorder of arteries and arterioles, unspecified: Secondary | ICD-10-CM | POA: Diagnosis not present

## 2016-09-19 DIAGNOSIS — I6523 Occlusion and stenosis of bilateral carotid arteries: Secondary | ICD-10-CM

## 2016-09-19 DIAGNOSIS — I70209 Unspecified atherosclerosis of native arteries of extremities, unspecified extremity: Secondary | ICD-10-CM

## 2016-09-19 DIAGNOSIS — I739 Peripheral vascular disease, unspecified: Secondary | ICD-10-CM | POA: Diagnosis not present

## 2016-09-19 DIAGNOSIS — Z87891 Personal history of nicotine dependence: Secondary | ICD-10-CM | POA: Diagnosis not present

## 2016-09-19 LAB — VAS US CAROTID
LCCADDIAS: -28 cm/s
LCCADSYS: -124 cm/s
LCCAPDIAS: 22 cm/s
LCCAPSYS: 83 cm/s
LEFT ECA DIAS: -14 cm/s
LEFT VERTEBRAL DIAS: -16 cm/s
LICADDIAS: -24 cm/s
LICADSYS: -53 cm/s
LICAPSYS: 195 cm/s
Left ICA prox dias: 69 cm/s
RCCADSYS: -76 cm/s
RCCAPSYS: 93 cm/s
RIGHT CCA MID DIAS: 21 cm/s
RIGHT ECA DIAS: -11 cm/s
Right CCA prox dias: 21 cm/s

## 2016-09-19 NOTE — Progress Notes (Signed)
Chief Complaint: Follow up Extracranial Carotid Artery Stenosis   History of Present Illness  Alicia Crawford is a 60 y.o. female  patient of Dr. [name] who is s/p angiography in April, 2014 for bilateral claudication. 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 She also has carotid artery disease. She returns today for follow up. She has a history of hypertension and hyperlipidemia which are medically managed. She had a witnessed episode of cardiac arrest with CPR and defibrillation which eventually led to coronary artery bypass grafting in 2011. During this hospitalization she was told that she had already had a stroke before this, but she does not recall any symptoms of ever having a stroke. She denies any subsequent strokes or TIA's.  She has had mild expressive aphasia since the cardiac arrest. She denies any monocular loss of vision. In 2013 she fell, hit her head, and sustained an intracranial bleed. She states that prior to her craniotomy she was able to walk 1.2 miles without stopping. She states that she would occasionally experience an aching in both of her legs. She would also have aching at night. She had previously tried Pletal but did not tolerate it. Dr. [name] decreased her dose to 50 mg twice a day. She is tolerating this.  She stopped taking Pletal on advice as one or her providers was concerned about the effect Pletal had on her depression, but after a week claudication in her legs became unbearable; she then resumed Pletal.   Patient has not had previous carotid artery intervention.  After walking for about 15 minutes, both calves ache; after she resumes walking she has no further claudication. She denies non healing wounds.  She had been walking mile daily. She does not have rest pain.  She has not been walking or exercising as much due to  recent heel injury and URI.  On review of records, Dr. [name] has been monitoring p'st peripheral artery occlusive disease and renal artery stenosis.  She also swims 3-4 days/week.  Pt denies ever having DVT. She reports that she has a history of MRSA connected with a tummy tuck.  Pt reports New Medical or Surgical History: laser surgery to both eyes related to complication of DM.  Pt Diabetic: Yes, 6.1 A1C on 09-17-15 was last result on file (review of records) Pt smoker: former smoker, quit 2008  Pt meds include: Statin : Yes ASA: yes, 81 mg daily Other anticoagulants/antiplatelets: cilostazol bid   Past Medical History:  Diagnosis Date  . Ankle fracture 09/05/2016   right ankle  . Anoxic brain injury ([place]) 08/2010   "w/cardiac arrest"  . Anxiety Jan. 2012  . Blood transfusion 09/2010  . Cardiac arrest ([place]) 08/04/2010  . Carotid artery occlusion    mild to moderate bilateral internal carotid artery stenosis  . Claudication ([place])   . Complication of anesthesia    [place]  . Coronary artery disease    CABG 2011 in [place]  . Coronary artery disease   . DEMENTIA   . Depression   . DVT (deep venous thrombosis) ([place])   . High cholesterol   . Hypertension   . Myocardial infarction 09/27/10   in [place]  . Obesity   . Peripheral vascular disease (HCC)    bilat SFA diseae  . Renal artery stenosis ([place])   . Rotator cuff tendonitis   . Stroke [facility]) 2011  . Subdural  hematoma [facility]) May 25,2013   Frontal subdural  S/P evacuation of hematoma  . Type II diabetes mellitus (HCC)    Diet and Exercise    Social History Social History  Substance Use Topics  . Smoking status: Former Smoker    Packs/day: 1.00    Years: 30.00    Types: Cigarettes    Quit date: 06/22/2005  . Smokeless tobacco: Never Used  . Alcohol use 0.0 oz/week     Comment: 01/17/12 "used to have glass of wine now & then; last wine was ~ 2011"    Family History Family History  Problem  Relation Age of Onset  . Heart disease Mother     before age 83  . Diabetes Mother   . Hyperlipidemia Mother   . Hypertension Mother   . Heart attack Mother   . Heart disease Father     Before age 3  . Hyperlipidemia Father   . Hypertension Father   . Heart attack Father   . Heart disease Sister     Before age 67  . Heart attack Sister   . Hyperlipidemia Brother   . Hypertension Brother   . Heart attack Brother   . Heart disease Brother     before age 12  . Heart disease Other   . Diabetes Other   . Hypertension Other   . Alcohol abuse Other   . Mental illness Other     Surgical History Past Surgical History:  Procedure Laterality Date  . ATHERECTOMY N/A 01/17/2012   Procedure: ATHERECTOMY;  Surgeon: Lorretta Harp, MD;  Location: [facility] CATH LAB;  Service: Cardiovascular;  Laterality: N/A;  . BACK SURGERY    . CHOLECYSTECTOMY  ~ 1980's  . CORONARY ARTERY BYPASS GRAFT  09/2010   CABG X4  . CRANIOTOMY  02/25/2012   Procedure: CRANIOTOMY HEMATOMA EVACUATION SUBDURAL;  Surgeon: Eustace Moore, MD;  Location: [place] NEURO ORS;  Service: Neurosurgery;  Laterality: Left;  Left craniotomy for evacuation of subdural hematoma  . LOWER EXTREMITY ANGIOGRAM  April 2013  . OVARIAN CYST REMOVAL  1983  . POSTERIOR FUSION LUMBAR SPINE  ` 2000  . SPINE SURGERY    . stomach and skin tuck  2000    Allergies  Allergen Reactions  . Haloth[place] Hives and Other (See Comments)    Gas used 1980's Reaction: affected her liver    Current Outpatient Prescriptions  Medication Sig Dispense Refill  . Ascorbic Acid (VITAMIN C) 100 MG tablet Take 100 mg by mouth daily.    [name] atorvastatin (LIPITOR) 80 MG tablet TAKE 1 BY MOUTH DAILY 90 tablet 3  . cilostazol (PLETAL) 100 MG tablet TAKE 1 BY MOUTH TWICE DAILY BEFORE A MEAL 180 tablet 3  . Coenzyme Q10 (EQL COQ10) 300 MG CAPS Take 1 capsule by mouth daily.    . Cyanocobalamin (VITAMIN B 12 PO) Take 1,000 mg by mouth.    . LECITHIN PO Take 1,200 mg by  mouth daily.    [name] losartan (COZAAR) 50 MG tablet Take 1 tablet (50 mg total) by mouth daily. 90 tablet 3  . metFORMIN (GLUCOPHAGE) 1000 MG tablet Take 1,000 mg by mouth daily.   0  . metoprolol tartrate (LOPRESSOR) 25 MG tablet Take 1 tablet (25 mg total) by mouth 2 (two) times daily. 180 tablet 3  . Multiple Vitamin (MULITIVITAMIN WITH MINERALS) TABS Take 1 tablet by mouth daily.    [name] omega-3 fish oil (MAXEPA) 1000 MG CAPS capsule Take 1 capsule  by mouth daily. Mega Red    . pantoprazole (PROTONIX) 40 MG tablet TAKE 1 BY MOUTH DAILY 90 tablet 0  . Plant Stanol Ester (BENECOL PO) Take by mouth daily.    . sertraline (ZOLOFT) 100 MG tablet Take 25 mg by mouth daily. Pt takes 25 mg daily.    . traMADol (ULTRAM) 50 MG tablet Take 1 tablet (50 mg total) by mouth every 6 (six) hours as needed. (Patient not taking: Reported on 09/19/2016) 15 tablet 0   No current facility-administered medications for this visit.     Review of Systems : See HPI for pertinent positives and negatives.  Physical Examination  Vitals:   09/19/16 1515 09/19/16 1519  BP: (!) 144/75 131/82  Pulse: 81   Resp: 18   Temp: 98.2 F (36.8 C)   TempSrc: Oral   SpO2: 100%   Weight: 190 lb (86.2 kg)   Height: 5\' 4"  (1.626 m)    Body mass index is 32.61 kg/m.  General: WDWN obese female in NAD GAIT: normal Eyes: PERRLA Pulmonary: Respirations are non-labored, CTAB, no rales, rhonchi, or wheezing.  Cardiac: regular rhythm, no detected murmur.  VASCULAR EXAM Carotid Bruits Left Right   Negative positive   Aorta is not palpable. Radial pulses are 2+ palpable and equal.      LE Pulses LEFT RIGHT   FEMORAL 1+ palpable 1+ palpable    POPLITEAL not palpable  not palpable   POSTERIOR TIBIAL not palpable  not palpable     DORSALIS PEDIS  ANTERIOR TIBIAL not palpable  not palpable     Gastrointestinal: soft, nontender, BS WNL, no r/g,no palpable masses.  Musculoskeletal: No muscle atrophy/wasting. M/S 5/5 throughout, Extremities without ischemic changes. Off loading boot on left lower extremity.   Neurologic: A&O X 3; Appropriate Affect, Speech is normal, CN 2-12 intact, Pain and light touch intact in extremities, Motor exam as listed above.    Assessment: Alicia Crawford is a 60 y.o. female who is s/p angiography in April, 2014 for bilateral claudication. 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  She also has carotid artery disease. She was told that she had a stroke at some point before her witnessed cardiac arrest in 2011 which eventually led to a CABG, no subsequent stroke or TIA.  She has had mild expressive aphasia since the cardiac arrest.  She has walked less since her last visit due to a right heel injury and a URI.  Her DM seems to be in good control and she quit tobacco use in 2008. She takes a daily ASA and a statin. She takes cilostizol which helps her claudication. She currently can walk as far as she wants with minimal or no claudication.   On review of records, Dr. [name] has been monitoring pt's peripheral artery occlusive disease and renal artery stenosis; will not repeat ABI's unless Dr. [name] requests [place] to monitor her PAOD.  DATA  Today's carotid Duplex suggests 40%-59% stenosis of the bilateral internal carotid artery.  >50% bilateral ECA stenosis. Bilateral vertebral artery flow is antegrade.  Bilateral subclavian artery waveforms are normal.  No significant change compared to the last exam on 09-21-15.  Bilateral ABI's have declined from 0.87 (on 09-21-15 on the right, to 0.73 today, monophasic waveforms, TBI of 0.75 (improved  from 0.50). Left has declined from 0.93 to 0.81, biphasic waveforms, TBI is 0.63 (improved from 0.55).  Plan: Follow-up in 6 months with Carotid Duplex scan, will defer surveillance of lower extremity arterial perfusion to Dr. [name] unless he lets [place] know otherwise.  Daily seated legs exercises discussed and demonstrated. After her heel injury resolves, then graduated walking program.    I discussed in depth with the patient the nature of atherosclerosis, and emphasized the importance of maximal medical management including strict control of blood pressure, blood glucose, and lipid levels, obtaining regular exercise, and continued cessation of smoking.  The patient is aware that without maximal medical management the underlying atherosclerotic disease process will progress, limiting the benefit of any interventions. The patient was given information about stroke prevention and what symptoms should prompt the patient to seek immediate medical care. Thank you for allowing [place] to participate in this patient's care.  Clemon Chambers, RN, MSN, FNP-C [facility] Office: [phone]  Clinic Physician: Early on call  09/19/16 3:36 PM

## 2016-09-19 NOTE — Patient Instructions (Signed)
Stroke Prevention Some medical conditions and behaviors are associated with an increased chance of having a stroke. You may prevent a stroke by making healthy choices and managing medical conditions. How can I reduce my risk of having a stroke?  Stay physically active. Get at least 30 minutes of activity on most or all days.  Do not smoke. It may also be helpful to avoid exposure to secondhand smoke.  Limit alcohol use. Moderate alcohol use is considered to be:  No more than 2 drinks per day for men.  No more than 1 drink per day for nonpregnant women.  Eat healthy foods. This involves:  Eating 5 or more servings of fruits and vegetables a day.  Making dietary changes that address high blood pressure (hypertension), high cholesterol, diabetes, or obesity.  Manage your cholesterol levels.  Making food choices that are high in fiber and low in saturated fat, trans fat, and cholesterol may control cholesterol levels.  Take any prescribed medicines to control cholesterol as directed by your health care provider.  Manage your diabetes.  Controlling your carbohydrate and sugar intake is recommended to manage diabetes.  Take any prescribed medicines to control diabetes as directed by your health care provider.  Control your hypertension.  Making food choices that are low in salt (sodium), saturated fat, trans fat, and cholesterol is recommended to manage hypertension.  Ask your health care provider if you need treatment to lower your blood pressure. Take any prescribed medicines to control hypertension as directed by your health care provider.  If you are 18-39 years of age, have your blood pressure checked every 3-5 years. If you are 40 years of age or older, have your blood pressure checked every year.  Maintain a healthy weight.  Reducing calorie intake and making food choices that are low in sodium, saturated fat, trans fat, and cholesterol are recommended to manage  weight.  Stop drug abuse.  Avoid taking birth control pills.  Talk to your health care provider about the risks of taking birth control pills if you are over 35 years old, smoke, get migraines, or have ever had a blood clot.  Get evaluated for sleep disorders (sleep apnea).  Talk to your health care provider about getting a sleep evaluation if you snore a lot or have excessive sleepiness.  Take medicines only as directed by your health care provider.  For some people, aspirin or blood thinners (anticoagulants) are helpful in reducing the risk of forming abnormal blood clots that can lead to stroke. If you have the irregular heart rhythm of atrial fibrillation, you should be on a blood thinner unless there is a good reason you cannot take them.  Understand all your medicine instructions.  Make sure that other conditions (such as anemia or atherosclerosis) are addressed. Get help right away if:  You have sudden weakness or numbness of the face, arm, or leg, especially on one side of the body.  Your face or eyelid droops to one side.  You have sudden confusion.  You have trouble speaking (aphasia) or understanding.  You have sudden trouble seeing in one or both eyes.  You have sudden trouble walking.  You have dizziness.  You have a loss of balance or coordination.  You have a sudden, severe headache with no known cause.  You have new chest pain or an irregular heartbeat. Any of these symptoms may represent a serious problem that is an emergency. Do not wait to see if the symptoms will go away.   Get medical help at once. Call your local emergency services (911 in [place] Do not drive yourself to the hospital.  This information is not intended to replace advice given to you by your health care provider. Make sure you discuss any questions you have with your health care provider. Document Released: 10/27/2004 Document Revised: 02/25/2016 Document Reviewed: 03/22/2013 Elsevier  Interactive Patient Education  2017 Elsevier Inc.      Peripheral Vascular Disease Peripheral vascular disease (PVD) is a disease of the blood vessels that are not part of your heart and brain. A simple term for PVD is poor circulation. In most cases, PVD narrows the blood vessels that carry blood from your heart to the rest of your body. This can result in a decreased supply of blood to your arms, legs, and internal organs, like your stomach or kidneys. However, it most often affects a person's lower legs and feet. There are two types of PVD.  Organic PVD. This is the more common type. It is caused by damage to the structure of blood vessels.  Functional PVD. This is caused by conditions that make blood vessels contract and tighten (spasm). Without treatment, PVD tends to get worse over time. PVD can also lead to acute ischemic limb. This is when an arm or limb suddenly has trouble getting enough blood. This is a medical emergency. Follow these instructions at home:  Take medicines only as told by your doctor.  Do not use any tobacco products, including cigarettes, chewing tobacco, or electronic cigarettes. If you need help quitting, ask your doctor.  Lose weight if you are overweight, and maintain a healthy weight as told by your doctor.  Eat a diet that is low in fat and cholesterol. If you need help, ask your doctor.  Exercise regularly. Ask your doctor for some good activities for you.  Take good care of your feet.  Wear comfortable shoes that fit well.  Check your feet often for any cuts or sores. Contact a doctor if:  You have cramps in your legs while walking.  You have leg pain when you are at rest.  You have coldness in a leg or foot.  Your skin changes.  You are unable to get or have an erection (erectile dysfunction).  You have cuts or sores on your feet that are not healing. Get help right away if:  Your arm or leg turns cold and blue.  Your arms or legs  become red, warm, swollen, painful, or numb.  You have chest pain or trouble breathing.  You suddenly have weakness in your face, arm, or leg.  You become very confused or you cannot speak.  You suddenly have a very bad headache.  You suddenly cannot see. This information is not intended to replace advice given to you by your health care provider. Make sure you discuss any questions you have with your health care provider. Document Released: 12/14/2009 Document Revised: 02/25/2016 Document Reviewed: 02/27/2014 Elsevier Interactive Patient Education  2017 Elsevier Inc.  

## 2016-09-20 DIAGNOSIS — G4733 Obstructive sleep apnea (adult) (pediatric): Secondary | ICD-10-CM | POA: Diagnosis not present

## 2016-09-20 NOTE — Addendum Note (Signed)
Addended by: Lianne Cure A on: 09/20/2016 02:02 PM   Modules accepted: Orders

## 2016-09-21 DIAGNOSIS — N393 Stress incontinence (female) (male): Secondary | ICD-10-CM | POA: Diagnosis not present

## 2016-09-21 DIAGNOSIS — N814 Uterovaginal prolapse, unspecified: Secondary | ICD-10-CM | POA: Diagnosis not present

## 2016-09-23 DIAGNOSIS — S8261XA Displaced fracture of lateral malleolus of right fibula, initial encounter for closed fracture: Secondary | ICD-10-CM | POA: Diagnosis not present

## 2016-09-29 DIAGNOSIS — F419 Anxiety disorder, unspecified: Secondary | ICD-10-CM | POA: Diagnosis not present

## 2016-10-21 DIAGNOSIS — G4733 Obstructive sleep apnea (adult) (pediatric): Secondary | ICD-10-CM | POA: Diagnosis not present

## 2016-10-24 NOTE — Addendum Note (Signed)
Addended by: Waylan Rocher on: 10/24/2016 08:30 AM   Modules accepted: Orders

## 2016-10-26 ENCOUNTER — Other Ambulatory Visit: Payer: Self-pay | Admitting: *Deleted

## 2016-10-26 DIAGNOSIS — S8261XD Displaced fracture of lateral malleolus of right fibula, subsequent encounter for closed fracture with routine healing: Secondary | ICD-10-CM | POA: Diagnosis not present

## 2016-10-26 MED ORDER — LOSARTAN POTASSIUM 50 MG PO TABS: 50.0000 mg | ORAL_TABLET | Freq: Every day | ORAL | 3 refills | Status: DC

## 2016-10-26 MED ORDER — CILOSTAZOL 100 MG PO TABS: ORAL_TABLET | ORAL | 3 refills | Status: DC

## 2016-11-04 DIAGNOSIS — M25671 Stiffness of right ankle, not elsewhere classified: Secondary | ICD-10-CM | POA: Diagnosis not present

## 2016-11-07 DIAGNOSIS — M25671 Stiffness of right ankle, not elsewhere classified: Secondary | ICD-10-CM | POA: Diagnosis not present

## 2016-11-15 DIAGNOSIS — M25671 Stiffness of right ankle, not elsewhere classified: Secondary | ICD-10-CM | POA: Diagnosis not present

## 2016-11-18 DIAGNOSIS — M25671 Stiffness of right ankle, not elsewhere classified: Secondary | ICD-10-CM | POA: Diagnosis not present

## 2016-11-21 DIAGNOSIS — G4733 Obstructive sleep apnea (adult) (pediatric): Secondary | ICD-10-CM | POA: Diagnosis not present

## 2016-11-23 DIAGNOSIS — S8261XD Displaced fracture of lateral malleolus of right fibula, subsequent encounter for closed fracture with routine healing: Secondary | ICD-10-CM | POA: Diagnosis not present

## 2016-12-05 ENCOUNTER — Other Ambulatory Visit: Payer: Self-pay | Admitting: Cardiovascular Disease

## 2016-12-09 ENCOUNTER — Telehealth: Payer: Self-pay | Admitting: Cardiovascular Disease

## 2016-12-09 NOTE — Telephone Encounter (Signed)
Returned call to patient She went to see her psychiatrist for depression  Her psychiatrist sent her for a sleep study -- she has a CPAP now Her psychiatrist told her that he sent Dr. [name] a letter w/sleep study results This MD wants to know if Dr. [name] got the letter  Chart reviewed & sleep study result info was sent to our office May 2017 and is scanned to [facility]

## 2016-12-09 NOTE — Telephone Encounter (Signed)
New Message     Needs the nurse to call him about CPAP machine and depression diagnosis , did you receive the letter from Dr Larina Earthly @ [facility]

## 2016-12-19 DIAGNOSIS — G4733 Obstructive sleep apnea (adult) (pediatric): Secondary | ICD-10-CM | POA: Diagnosis not present

## 2017-01-10 ENCOUNTER — Other Ambulatory Visit: Payer: Self-pay | Admitting: Cardiovascular Disease

## 2017-01-10 MED ORDER — ATORVASTATIN CALCIUM 80 MG PO TABS: ORAL_TABLET | ORAL | 2 refills | Status: DC

## 2017-01-10 MED ORDER — METOPROLOL TARTRATE 25 MG PO TABS: 25.0000 mg | ORAL_TABLET | Freq: Two times a day (BID) | ORAL | 2 refills | Status: DC

## 2017-01-10 NOTE — Telephone Encounter (Signed)
New Message      *STAT* If patient is at the pharmacy, call can be transferred to refill team.   1. Which medications need to be refilled? (please list name of each medication and dose if known)  atorvastatin (LIPITOR) 80 MG tablet TAKE 1 BY MOUTH DAILY   metoprolol tartrate (LOPRESSOR) 25 MG tablet Take 1 tablet (25 mg total) by mouth 2 (two) times daily.     2. Which pharmacy/location (including street and city if local pharmacy) is medication to be sent to? [facility] on [address]   3. Do they need a 30 day or 90 day supply? [place]

## 2017-01-10 NOTE — Telephone Encounter (Signed)
Rx(s) sent to pharmacy electronically.  

## 2017-01-11 ENCOUNTER — Ambulatory Visit (INDEPENDENT_AMBULATORY_CARE_PROVIDER_SITE_OTHER): Payer: PPO | Admitting: Ophthalmology

## 2017-01-11 DIAGNOSIS — E113392 Type 2 diabetes mellitus with moderate nonproliferative diabetic retinopathy without macular edema, left eye: Secondary | ICD-10-CM

## 2017-01-11 DIAGNOSIS — H33301 Unspecified retinal break, right eye: Secondary | ICD-10-CM | POA: Diagnosis not present

## 2017-01-11 DIAGNOSIS — H35033 Hypertensive retinopathy, bilateral: Secondary | ICD-10-CM

## 2017-01-11 DIAGNOSIS — I10 Essential (primary) hypertension: Secondary | ICD-10-CM | POA: Diagnosis not present

## 2017-01-11 DIAGNOSIS — E113311 Type 2 diabetes mellitus with moderate nonproliferative diabetic retinopathy with macular edema, right eye: Secondary | ICD-10-CM

## 2017-01-11 DIAGNOSIS — H43813 Vitreous degeneration, bilateral: Secondary | ICD-10-CM

## 2017-01-11 DIAGNOSIS — E11311 Type 2 diabetes mellitus with unspecified diabetic retinopathy with macular edema: Secondary | ICD-10-CM | POA: Diagnosis not present

## 2017-01-14 DIAGNOSIS — S93402A Sprain of unspecified ligament of left ankle, initial encounter: Secondary | ICD-10-CM | POA: Diagnosis not present

## 2017-01-14 DIAGNOSIS — S93401A Sprain of unspecified ligament of right ankle, initial encounter: Secondary | ICD-10-CM | POA: Diagnosis not present

## 2017-01-19 DIAGNOSIS — G4733 Obstructive sleep apnea (adult) (pediatric): Secondary | ICD-10-CM | POA: Diagnosis not present

## 2017-03-13 ENCOUNTER — Encounter: Payer: Self-pay | Admitting: Family

## 2017-03-23 ENCOUNTER — Ambulatory Visit (INDEPENDENT_AMBULATORY_CARE_PROVIDER_SITE_OTHER): Payer: PPO | Admitting: Family

## 2017-03-23 ENCOUNTER — Encounter: Payer: Self-pay | Admitting: Family

## 2017-03-23 ENCOUNTER — Ambulatory Visit (HOSPITAL_COMMUNITY)
Admission: RE | Admit: 2017-03-23 | Discharge: 2017-03-23 | Disposition: A | Discharge status: Home / Self Care | Payer: PPO | Source: Ambulatory Visit | Attending: Vascular Surgery | Admitting: Vascular Surgery

## 2017-03-23 VITALS — BP 137/80 | HR 69 | Temp 97.0°F | Resp 16 | Ht 64.0 in | Wt 184.0 lb

## 2017-03-23 DIAGNOSIS — Z87891 Personal history of nicotine dependence: Secondary | ICD-10-CM | POA: Insufficient documentation

## 2017-03-23 DIAGNOSIS — I6523 Occlusion and stenosis of bilateral carotid arteries: Secondary | ICD-10-CM

## 2017-03-23 DIAGNOSIS — I70209 Unspecified atherosclerosis of native arteries of extremities, unspecified extremity: Secondary | ICD-10-CM

## 2017-03-23 DIAGNOSIS — I779 Disorder of arteries and arterioles, unspecified: Secondary | ICD-10-CM

## 2017-03-23 LAB — VAS US CAROTID
LCCADSYS: -113 cm/s
LCCAPDIAS: 16 cm/s
LEFT ECA DIAS: -4 cm/s
LEFT VERTEBRAL DIAS: -15 cm/s
LICADSYS: -62 cm/s
Left CCA dist dias: -26 cm/s
Left CCA prox sys: 61 cm/s
Left ICA dist dias: -21 cm/s
RCCAPSYS: 89 cm/s
RIGHT CCA MID DIAS: 18 cm/s
RIGHT ECA DIAS: -2 cm/s
RIGHT VERTEBRAL DIAS: -4 cm/s
Right CCA prox dias: 16 cm/s

## 2017-03-23 NOTE — Progress Notes (Signed)
Chief Complaint: Follow up Extracranial Carotid Artery Stenosis and PAOD   History of Present Illness  Alicia Crawford is a 61 y.o. female patient of Dr. [name] who is s/p angiography in April, 2014 for bilateral claudication. 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  She also has carotid artery disease. She returns today for follow up.  She has a history of hypertension and hyperlipidemia which are medically managed. She had a witnessed episode of cardiac arrest with CPR and defibrillation which eventually led to coronary artery bypass grafting in 2011. During this hospitalization she was told that she had already had a stroke before this, but she does not recall any symptoms of ever having a stroke. She denies any subsequent strokes or TIA's.   She has had mild expressive aphasia since the cardiac arrest. She denies any monocular loss of vision.  In 2013 she fell, hit her head, and sustained an intracranial bleed. She states that prior to her craniotomy she was able to walk 1.2 miles without stopping. She states that she would occasionally experience an aching in both of her legs. She would also have aching at night. She had previously tried Pletal but did not tolerate it. Dr. [name] decreased her dose to 50 mg twice a day. She is tolerating this.  She stopped taking Pletal on advice as one or her providers was concerned about the effect Pletal had on her depression, but after a week claudication in her legs became unbearable; she then resumed Pletal.   Patient has not had previous carotid artery intervention.  After walking for about 15 minutes, both calves ache, states this has improved; after she resumes walking she has no further claudication. She denies non healing wounds.  She had been walking mile daily. She does not have rest pain. She also swims  3-4 days/week for at least an hour.    Review of records, Dr. [name] has been monitoring pt's peripheral artery occlusive disease and renal artery stenosis until 2015.   Pt denies ever having DVT. She reports that she has a history of MRSA connected with a tummy tuck.  Pt reports New Medical or Surgical History: laser surgery to both eyes related to complication of DM. She is taking Pletal bid to help with claudication.   Pt Diabetic: Yes, 6.1 A1C on 09-17-15 was last result on file (review of records) Pt smoker: former smoker, quit 2008  Pt meds include: Statin : Yes ASA: yes, 81 mg daily Other anticoagulants/antiplatelets: no   Past Medical History:  Diagnosis Date  . Ankle fracture 09/05/2016   right ankle  . Anoxic brain injury ([place]) 08/2010   "w/cardiac arrest"  . Anxiety Jan. 2012  . Blood transfusion 09/2010  . Cardiac arrest ([place]) 08/04/2010  . Carotid artery occlusion    mild to moderate bilateral internal carotid artery stenosis  . Claudication ([place])   . Complication of anesthesia    [place]  . Coronary artery disease    CABG 2011 in [place]  . Coronary artery disease   . DEMENTIA   . Depression   . DVT (deep venous thrombosis) ([place])   . High cholesterol   . Hypertension   . Myocardial infarction [facility]) 09/27/10   in [place]  . Obesity   . Peripheral vascular disease (HCC)    bilat SFA diseae  . Renal artery stenosis ([place])   . Rotator cuff  tendonitis   . Stroke [facility]) 2011  . Subdural hematoma [facility]) May 25,2013   Frontal subdural  S/P evacuation of hematoma  . Type II diabetes mellitus (HCC)    Diet and Exercise    Social History Social History  Substance Use Topics  . Smoking status: Former Smoker    Packs/day: 1.00    Years: 30.00    Types: Cigarettes    Quit date: 06/22/2005  . Smokeless tobacco: Never Used  . Alcohol use 0.0 oz/week     Comment: 01/17/12 "used to have glass of wine now & then; last wine was ~ 2011"     Family History Family History  Problem Relation Age of Onset  . Heart disease Mother        before age 81  . Diabetes Mother   . Hyperlipidemia Mother   . Hypertension Mother   . Heart attack Mother   . Heart disease Father        Before age 49  . Hyperlipidemia Father   . Hypertension Father   . Heart attack Father   . Heart disease Sister        Before age 52  . Heart attack Sister   . Hyperlipidemia Brother   . Hypertension Brother   . Heart attack Brother   . Heart disease Brother        before age 19  . Heart disease Other   . Diabetes Other   . Hypertension Other   . Alcohol abuse Other   . Mental illness Other     Surgical History Past Surgical History:  Procedure Laterality Date  . ATHERECTOMY N/A 01/17/2012   Procedure: ATHERECTOMY;  Surgeon: Lorretta Harp, MD;  Location: [facility] CATH LAB;  Service: Cardiovascular;  Laterality: N/A;  . BACK SURGERY    . CHOLECYSTECTOMY  ~ 1980's  . CORONARY ARTERY BYPASS GRAFT  09/2010   CABG X4  . CRANIOTOMY  02/25/2012   Procedure: CRANIOTOMY HEMATOMA EVACUATION SUBDURAL;  Surgeon: Eustace Moore, MD;  Location: [place] NEURO ORS;  Service: Neurosurgery;  Laterality: Left;  Left craniotomy for evacuation of subdural hematoma  . LOWER EXTREMITY ANGIOGRAM  April 2013  . OVARIAN CYST REMOVAL  1983  . POSTERIOR FUSION LUMBAR SPINE  ` 2000  . SPINE SURGERY    . stomach and skin tuck  2000    Allergies  Allergen Reactions  . [place] Hives and Other (See Comments)    Gas used 1980's Reaction: affected her liver    Current Outpatient Prescriptions  Medication Sig Dispense Refill  . Ascorbic Acid (VITAMIN C) 100 MG tablet Take 100 mg by mouth daily.    [name] atorvastatin (LIPITOR) 80 MG tablet TAKE 1 BY MOUTH DAILY 90 tablet 2  . cilostazol (PLETAL) 100 MG tablet TAKE 1 BY MOUTH TWICE DAILY BEFORE A MEAL 180 tablet 3  . Coenzyme Q10 (EQL COQ10) 300 MG CAPS Take 1 capsule by mouth daily.    . Cyanocobalamin (VITAMIN B 12 PO)  Take 1,000 mg by mouth.    . LECITHIN PO Take 1,200 mg by mouth daily.    [name] losartan (COZAAR) 50 MG tablet Take 1 tablet (50 mg total) by mouth daily. 90 tablet 3  . metFORMIN (GLUCOPHAGE) 1000 MG tablet Take 1,000 mg by mouth daily.   0  . metoprolol tartrate (LOPRESSOR) 25 MG tablet Take 1 tablet (25 mg total) by mouth 2 (two) times daily. 180 tablet 2  . Multiple Vitamin (MULITIVITAMIN WITH MINERALS)  TABS Take 1 tablet by mouth daily.    [name] omega-3 fish oil (MAXEPA) 1000 MG CAPS capsule Take 1 capsule by mouth daily. Mega Red    . pantoprazole (PROTONIX) 40 MG tablet Take 1 tablet (40 mg total) by mouth daily. 90 tablet 2  . Plant Stanol Ester (BENECOL PO) Take by mouth daily.    . sertraline (ZOLOFT) 100 MG tablet Take 25 mg by mouth daily. Pt takes 25 mg daily.    . traMADol (ULTRAM) 50 MG tablet Take 1 tablet (50 mg total) by mouth every 6 (six) hours as needed. 15 tablet 0   No current facility-administered medications for this visit.     Review of Systems : See HPI for pertinent positives and negatives.  Physical Examination  Vitals:   03/23/17 1117 03/23/17 1120  BP: 139/85 137/80  Pulse: 69   Resp: 16   Temp: 97 F (36.1 C)   TempSrc: Oral   SpO2: 97%   Weight: 184 lb (83.5 kg)   Height: 5\' 4"  (1.626 m)    Body mass index is 31.58 kg/m.  General: WDWN obese female in NAD GAIT:normal Eyes: PERRLA Pulmonary: Respirations are non-labored, CTAB, no rales, rhonchi, or wheezing.  Cardiac: regular rhythm, no detected murmur.  VASCULAR EXAM Carotid Bruits Left Right   Negative positive   Aorta is not palpable. Radial pulses are 2+ palpable and equal.      LE Pulses LEFT RIGHT   FEMORAL 1+ palpable 1+ palpable    POPLITEAL not palpable  not palpable   POSTERIOR TIBIAL not palpable   not palpable    DORSALIS PEDIS  ANTERIOR TIBIAL not palpable  not palpable     Gastrointestinal: soft, nontender, BS WNL, no r/g,no palpable masses.  Musculoskeletal: No muscle atrophy/wasting. M/S 5/5 throughout, Extremities without ischemic changes.   Neurologic: A&O X 3; Appropriate Affect,speech is normal, CN 2-12 intact, Pain and light touch intact in extremities, Motor exam as listed above    Assessment: Alicia Crawford is a 61 y.o. female who is s/p angiography in April, 2014 for bilateral claudication. 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  She also has carotid artery disease. She was told that she had a stroke at some point before her witnessed cardiac arrest in 2011 which eventually led to a CABG, no subsequent stroke or TIA.  She has had mild expressive aphasia since the cardiac arrest.  Her blood pressure remains in good control.   Her DM seems to be in good control and she quit tobacco use in 2008. She takes a daily ASA and a statin. She takes cilostizol which helps her claudication. She currently can walk as far as she wants with mild claudication.    DATA  Carotid Duplex (03/23/17): 40%-59% stenosis of the bilateral internal carotid artery.  >50% bilateral ECA stenosis. Bilateral vertebral artery flow is antegrade.  Bilateral subclavian artery waveforms are normal.  No significant change compared to the last exams on 09-21-15 and 09-19-16.  ABI (09-19-16) Bilateral ABI's have declined from 0.87 (on 09-21-15 on the right, to 0.73 today, monophasic waveforms, TBI of 0.75 (improved from 0.50). Left has declined from 0.93 to 0.81, biphasic waveforms, TBI is 0.63 (improved from 0.55).    Plan: Follow-up in 1 year with Carotid Duplex scan and ABI, pt states Dr. [name] has deferred monitoring of her  peripheral artery disease to [place], he has not  requested renal nor PAD studies since 2015.   Daily seated legs exercises discussed and demonstrated. After her heel injury resolves, then graduated walking program.    I discussed in depth with the patient the nature of atherosclerosis, and emphasized the importance of maximal medical management including strict control of blood pressure, blood glucose, and lipid levels, obtaining regular exercise, and continued cessation of smoking.  The patient is aware that without maximal medical management the underlying atherosclerotic disease process will progress, limiting the benefit of any interventions. The patient was given information about stroke prevention and what symptoms should prompt the patient to seek immediate medical care. Thank you for allowing [place] to participate in this patient's care.  Clemon Chambers, RN, MSN, FNP-C [facility] Office: [facility] Physician: [name]  03/23/17 11:46 AM

## 2017-03-23 NOTE — Patient Instructions (Signed)
Stroke Prevention Some medical conditions and behaviors are associated with an increased chance of having a stroke. You may prevent a stroke by making healthy choices and managing medical conditions. How can I reduce my risk of having a stroke?  Stay physically active. Get at least 30 minutes of activity on most or all days.  Do not smoke. It may also be helpful to avoid exposure to secondhand smoke.  Limit alcohol use. Moderate alcohol use is considered to be:  No more than 2 drinks per day for men.  No more than 1 drink per day for nonpregnant women.  Eat healthy foods. This involves:  Eating 5 or more servings of fruits and vegetables a day.  Making dietary changes that address high blood pressure (hypertension), high cholesterol, diabetes, or obesity.  Manage your cholesterol levels.  Making food choices that are high in fiber and low in saturated fat, trans fat, and cholesterol may control cholesterol levels.  Take any prescribed medicines to control cholesterol as directed by your health care provider.  Manage your diabetes.  Controlling your carbohydrate and sugar intake is recommended to manage diabetes.  Take any prescribed medicines to control diabetes as directed by your health care provider.  Control your hypertension.  Making food choices that are low in salt (sodium), saturated fat, trans fat, and cholesterol is recommended to manage hypertension.  Ask your health care provider if you need treatment to lower your blood pressure. Take any prescribed medicines to control hypertension as directed by your health care provider.  If you are 18-39 years of age, have your blood pressure checked every 3-5 years. If you are 40 years of age or older, have your blood pressure checked every year.  Maintain a healthy weight.  Reducing calorie intake and making food choices that are low in sodium, saturated fat, trans fat, and cholesterol are recommended to manage  weight.  Stop drug abuse.  Avoid taking birth control pills.  Talk to your health care provider about the risks of taking birth control pills if you are over 35 years old, smoke, get migraines, or have ever had a blood clot.  Get evaluated for sleep disorders (sleep apnea).  Talk to your health care provider about getting a sleep evaluation if you snore a lot or have excessive sleepiness.  Take medicines only as directed by your health care provider.  For some people, aspirin or blood thinners (anticoagulants) are helpful in reducing the risk of forming abnormal blood clots that can lead to stroke. If you have the irregular heart rhythm of atrial fibrillation, you should be on a blood thinner unless there is a good reason you cannot take them.  Understand all your medicine instructions.  Make sure that other conditions (such as anemia or atherosclerosis) are addressed. Get help right away if:  You have sudden weakness or numbness of the face, arm, or leg, especially on one side of the body.  Your face or eyelid droops to one side.  You have sudden confusion.  You have trouble speaking (aphasia) or understanding.  You have sudden trouble seeing in one or both eyes.  You have sudden trouble walking.  You have dizziness.  You have a loss of balance or coordination.  You have a sudden, severe headache with no known cause.  You have new chest pain or an irregular heartbeat. Any of these symptoms may represent a serious problem that is an emergency. Do not wait to see if the symptoms will go away.   Get medical help at once. Call your local emergency services (911 in [place] Do not drive yourself to the hospital.  This information is not intended to replace advice given to you by your health care provider. Make sure you discuss any questions you have with your health care provider. Document Released: 10/27/2004 Document Revised: 02/25/2016 Document Reviewed: 03/22/2013 Elsevier  Interactive Patient Education  2017 Elsevier Inc.      Peripheral Vascular Disease Peripheral vascular disease (PVD) is a disease of the blood vessels that are not part of your heart and brain. A simple term for PVD is poor circulation. In most cases, PVD narrows the blood vessels that carry blood from your heart to the rest of your body. This can result in a decreased supply of blood to your arms, legs, and internal organs, like your stomach or kidneys. However, it most often affects a person's lower legs and feet. There are two types of PVD.  Organic PVD. This is the more common type. It is caused by damage to the structure of blood vessels.  Functional PVD. This is caused by conditions that make blood vessels contract and tighten (spasm). Without treatment, PVD tends to get worse over time. PVD can also lead to acute ischemic limb. This is when an arm or limb suddenly has trouble getting enough blood. This is a medical emergency. Follow these instructions at home:  Take medicines only as told by your doctor.  Do not use any tobacco products, including cigarettes, chewing tobacco, or electronic cigarettes. If you need help quitting, ask your doctor.  Lose weight if you are overweight, and maintain a healthy weight as told by your doctor.  Eat a diet that is low in fat and cholesterol. If you need help, ask your doctor.  Exercise regularly. Ask your doctor for some good activities for you.  Take good care of your feet.  Wear comfortable shoes that fit well.  Check your feet often for any cuts or sores. Contact a doctor if:  You have cramps in your legs while walking.  You have leg pain when you are at rest.  You have coldness in a leg or foot.  Your skin changes.  You are unable to get or have an erection (erectile dysfunction).  You have cuts or sores on your feet that are not healing. Get help right away if:  Your arm or leg turns cold and blue.  Your arms or legs  become red, warm, swollen, painful, or numb.  You have chest pain or trouble breathing.  You suddenly have weakness in your face, arm, or leg.  You become very confused or you cannot speak.  You suddenly have a very bad headache.  You suddenly cannot see. This information is not intended to replace advice given to you by your health care provider. Make sure you discuss any questions you have with your health care provider. Document Released: 12/14/2009 Document Revised: 02/25/2016 Document Reviewed: 02/27/2014 Elsevier Interactive Patient Education  2017 Elsevier Inc.  

## 2017-03-24 NOTE — Addendum Note (Signed)
Addended by: Lianne Cure A on: 03/24/2017 10:27 AM   Modules accepted: Orders

## 2017-04-27 DIAGNOSIS — F419 Anxiety disorder, unspecified: Secondary | ICD-10-CM | POA: Diagnosis not present

## 2017-08-09 ENCOUNTER — Encounter: Payer: Self-pay | Admitting: Family

## 2017-08-21 DIAGNOSIS — E785 Hyperlipidemia, unspecified: Secondary | ICD-10-CM | POA: Diagnosis not present

## 2017-08-21 DIAGNOSIS — E119 Type 2 diabetes mellitus without complications: Secondary | ICD-10-CM | POA: Diagnosis not present

## 2017-08-21 DIAGNOSIS — M25571 Pain in right ankle and joints of right foot: Secondary | ICD-10-CM | POA: Diagnosis not present

## 2017-08-21 DIAGNOSIS — Z23 Encounter for immunization: Secondary | ICD-10-CM | POA: Diagnosis not present

## 2017-08-21 DIAGNOSIS — I10 Essential (primary) hypertension: Secondary | ICD-10-CM | POA: Diagnosis not present

## 2017-09-04 DIAGNOSIS — S93491A Sprain of other ligament of right ankle, initial encounter: Secondary | ICD-10-CM | POA: Diagnosis not present

## 2017-09-04 DIAGNOSIS — M25571 Pain in right ankle and joints of right foot: Secondary | ICD-10-CM | POA: Diagnosis not present

## 2017-09-05 DIAGNOSIS — F419 Anxiety disorder, unspecified: Secondary | ICD-10-CM | POA: Diagnosis not present

## 2017-09-08 ENCOUNTER — Other Ambulatory Visit: Payer: Self-pay

## 2017-09-08 MED ORDER — ATORVASTATIN CALCIUM 80 MG PO TABS: ORAL_TABLET | ORAL | 2 refills | Status: DC

## 2017-10-01 ENCOUNTER — Other Ambulatory Visit: Payer: Self-pay | Admitting: Cardiovascular Disease

## 2017-10-02 ENCOUNTER — Other Ambulatory Visit: Payer: Self-pay | Admitting: *Deleted

## 2017-10-02 MED ORDER — LOSARTAN POTASSIUM 50 MG PO TABS: 50.0000 mg | ORAL_TABLET | Freq: Every day | ORAL | 0 refills | Status: DC

## 2017-10-06 ENCOUNTER — Other Ambulatory Visit: Payer: Self-pay | Admitting: Cardiovascular Disease

## 2017-10-06 ENCOUNTER — Other Ambulatory Visit: Payer: Self-pay | Admitting: *Deleted

## 2017-10-06 MED ORDER — CILOSTAZOL 100 MG PO TABS: ORAL_TABLET | ORAL | 0 refills | Status: DC

## 2017-10-06 MED ORDER — METOPROLOL TARTRATE 25 MG PO TABS: 25.0000 mg | ORAL_TABLET | Freq: Two times a day (BID) | ORAL | 0 refills | Status: DC

## 2017-11-02 ENCOUNTER — Other Ambulatory Visit: Payer: Self-pay | Admitting: Cardiovascular Disease

## 2017-11-03 NOTE — Telephone Encounter (Signed)
REFILL 

## 2017-11-30 ENCOUNTER — Other Ambulatory Visit: Payer: Self-pay | Admitting: Cardiovascular Disease

## 2017-11-30 MED ORDER — METOPROLOL TARTRATE 25 MG PO TABS: 25.0000 mg | ORAL_TABLET | Freq: Two times a day (BID) | ORAL | 0 refills | Status: DC

## 2017-11-30 NOTE — Telephone Encounter (Signed)
New message    *STAT* If patient is at the pharmacy, call can be transferred to refill team.   1. Which medications need to be refilled? (please list name of each medication and dose if known) metoprolol tartrate (LOPRESSOR) 25 MG tablet  2. Which pharmacy/location (including street and city if local pharmacy) is medication to be sent to? [facility] [place] [address]  3. Do they need a 30 day or 90 day supply? [place]

## 2017-11-30 NOTE — Telephone Encounter (Signed)
Left message informing patient that her rx was sent to the pharmacy.

## 2017-12-15 DIAGNOSIS — I2510 Atherosclerotic heart disease of native coronary artery without angina pectoris: Secondary | ICD-10-CM | POA: Diagnosis not present

## 2017-12-15 DIAGNOSIS — I10 Essential (primary) hypertension: Secondary | ICD-10-CM | POA: Diagnosis not present

## 2017-12-15 DIAGNOSIS — E785 Hyperlipidemia, unspecified: Secondary | ICD-10-CM | POA: Diagnosis not present

## 2017-12-15 DIAGNOSIS — I739 Peripheral vascular disease, unspecified: Secondary | ICD-10-CM | POA: Diagnosis not present

## 2017-12-15 DIAGNOSIS — E119 Type 2 diabetes mellitus without complications: Secondary | ICD-10-CM | POA: Diagnosis not present

## 2017-12-26 ENCOUNTER — Ambulatory Visit: Payer: PPO | Admitting: Cardiovascular Disease

## 2017-12-26 ENCOUNTER — Encounter: Payer: Self-pay | Admitting: Cardiovascular Disease

## 2017-12-26 VITALS — BP 115/72 | HR 76 | Wt 190.0 lb

## 2017-12-26 DIAGNOSIS — I739 Peripheral vascular disease, unspecified: Secondary | ICD-10-CM | POA: Diagnosis not present

## 2017-12-26 DIAGNOSIS — I701 Atherosclerosis of renal artery: Secondary | ICD-10-CM | POA: Diagnosis not present

## 2017-12-26 DIAGNOSIS — I2510 Atherosclerotic heart disease of native coronary artery without angina pectoris: Secondary | ICD-10-CM | POA: Diagnosis not present

## 2017-12-26 NOTE — Progress Notes (Signed)
12/26/2017 Alicia Crawford   1956/08/28  [id number]  Primary Physician Eunice Blase, MD Primary Cardiologist: Lorretta Harp MD FACP, [place], [place], [place]  HPI:  Alicia Crawford is a 62 y.o.  moderately overweight, married Caucasian female, mother of 3 who I last saw in the office 08/17/16.[name] She has a history of remote tobacco abuse having quit 10 years ago, hypertension, hyperlipidemia and a family history of heart disease with both mother and father who had CAD and a brother who had a stent. She had an episode of witnessed cardiac arrest, CPR, defibrillation and ultimately coronary artery bypass grafting in [facility] September 27, 2010. She had prolonged hospitalization on a ventilator and rehab. Echo performed April 2012 showed normal LV systolic function. A Myoview was normal as well. She otherwise is asymptomatic except for lifestyle-limiting claudication. Dopplers performed in our office December 06, 2011, revealed ABIs of 0.46 on the right with an occluded SFA and high-grade distal right common, profunda stenosis, left ABI of 0.62 with an occluded left SFA. I angiogram'd her January 17, 2012, demonstrating occluded SFAs bilaterally with 2-vessel runoff on the right, 1 on the left via peroneal. She did have a 95% calcified ostial profunda femoris stenosis on the right jeopardizing collaterals to her infrapopliteal vessels as well as a 60% distal left common femoral artery stenosis. She also had an incidentally noted 35% right renal artery stenosis. I reviewed the films with Dr. [name] who felt surgical revascularization was optimal. She in the interim developed a subdural hematoma after falling and hitting her head and her workup was put on hold. Today in the office she continues to complain of lifestyle-limiting claudication. She apparently saw Dr. [name] who recommended continued medical/conservative therapy. Since I saw her back in the office one year ago she's remained completely  asymptomatic. She denies chest pain or shortness of breath. she also denies lifestyle limiting claudication at this time.     Current Meds  Medication Sig  . Ascorbic Acid (VITAMIN C) 100 MG tablet Take 100 mg by mouth daily.  [name] atorvastatin (LIPITOR) 80 MG tablet TAKE 1 BY MOUTH DAILY  . cilostazol (PLETAL) 100 MG tablet TAKE 1 TABLET BY MOUTH TWICE DAILY BEFORE MEALS  . Coenzyme Q10 (EQL COQ10) 300 MG CAPS Take 1 capsule by mouth daily.  . Cyanocobalamin (VITAMIN B 12 PO) Take 1,000 mg by mouth.  . losartan (COZAAR) 50 MG tablet Take 1 tablet (50 mg total) by mouth daily. NEED OV.  . metFORMIN (GLUCOPHAGE) 1000 MG tablet Take 1,000 mg by mouth daily.   . metoprolol tartrate (LOPRESSOR) 25 MG tablet Take 1 tablet (25 mg total) by mouth 2 (two) times daily. NEED OV.  . Multiple Vitamin (MULITIVITAMIN WITH MINERALS) TABS Take 1 tablet by mouth daily.  [name] omega-3 fish oil (MAXEPA) 1000 MG CAPS capsule Take 1 capsule by mouth daily. Mega Red  . pantoprazole (PROTONIX) 40 MG tablet Take 1 tablet (40 mg total) by mouth daily.  . Plant Stanol Ester (BENECOL PO) Take by mouth daily.  . sertraline (ZOLOFT) 100 MG tablet Take 25 mg by mouth daily. Pt takes 25 mg daily.     Allergies  Allergen Reactions  . Halothane Hives and Other (See Comments)    Gas used 1980's Reaction: affected her liver    Social History   Socioeconomic History  . Marital status: Married    Spouse name: Not on file  . Number of children: Not on file  .  Years of education: Not on file  . Highest education level: Not on file  Occupational History  . Not on file  Social Needs  . Financial resource strain: Not on file  . Food insecurity:    Worry: Not on file    Inability: Not on file  . Transportation needs:    Medical: Not on file    Non-medical: Not on file  Tobacco Use  . Smoking status: Former Smoker    Packs/day: 1.00    Years: 30.00    Pack years: 30.00    Types: Cigarettes    Last attempt to quit:  06/22/2005    Years since quitting: 12.5  . Smokeless tobacco: Never Used  Substance and Sexual Activity  . Alcohol use: Yes    Alcohol/week: 0.0 oz    Comment: 01/17/12 "used to have glass of wine now & then; last wine was ~ 2011"  . Drug use: No  . Sexual activity: Yes  Lifestyle  . Physical activity:    Days per week: Not on file    Minutes per session: Not on file  . Stress: Not on file  Relationships  . Social connections:    Talks on phone: Not on file    Gets together: Not on file    Attends religious service: Not on file    Active member of club or organization: Not on file    Attends meetings of clubs or organizations: Not on file    Relationship status: Not on file  . Intimate partner violence:    Fear of current or ex partner: Not on file    Emotionally abused: Not on file    Physically abused: Not on file    Forced sexual activity: Not on file  Other Topics Concern  . Not on file  Social History Narrative  . Not on file     Review of Systems: General: negative for chills, fever, night sweats or weight changes.  Cardiovascular: negative for chest pain, dyspnea on exertion, edema, orthopnea, palpitations, paroxysmal nocturnal dyspnea or shortness of breath Dermatological: negative for rash Respiratory: negative for cough or wheezing Urologic: negative for hematuria Abdominal: negative for nausea, vomiting, diarrhea, bright red blood per rectum, melena, or hematemesis Neurologic: negative for visual changes, syncope, or dizziness All other systems reviewed and are otherwise negative except as noted above.    Blood pressure 115/72, pulse 76, weight 190 lb (86.2 kg).  General appearance: alert and no distress Neck: no adenopathy, no JVD, supple, symmetrical, trachea midline, thyroid not enlarged, symmetric, no tenderness/mass/nodules and soft high-frequency left carotid bruit Lungs: clear to auscultation bilaterally Heart: regular rate and rhythm, S1, S2 normal,  no murmur, click, rub or gallop Extremities: extremities normal, atraumatic, no cyanosis or edema Pulses: diminished pedal pulses Skin: Skin color, texture, turgor normal. No rashes or lesions Neurologic: Alert and oriented X 3, normal strength and tone. Normal symmetric reflexes. Normal coordination and gait  EKG normal sinus rhythm at 76 with nonspecific ST and T-wave changes. I personally reviewed this EKG  ASSESSMENT AND PLAN:   Peripheral artery disease, bilta LE PVD by angio April 2013 History of peripheral arterial disease status post angiography by myself 01/17/12 revealing occluded SFAs bilaterally with 2 vessel runoff on the right and one on the left via peroneal artery. She did have a 95% calcified ostial profunda femoris stenosis on the right upper arising her collaterals to her infrapopliteal vesselsas well as a 60% distal left common femoral artery stenosis. I  did refer her to Dr. [name] who ultimately felt that medical therapy was her best option. She really denies spell limiting claudication at this time.  HLD (hyperlipidemia) History of hyperlipidemia on statin therapy followed by her PCP  HTN (hypertension) History of essential hypertension blood pressure measured today at 115/72.She is on losartan and Lopressor. Continue current meds at current dosing.  Renal artery stenosis, Right, 75% History of 75% renal artery stenosis found at the time of angiography which we followed by duplex ultrasound.  CAD, cardiac arrest, CPR, CABG in [facility] Dec 2011-NL LVF by Echo, neg Nuc April 2013 History of CAD status post witnessed cardiac arrest, CPR or defibrillation and ultimately coronary artery bypass grafting in [facility] 09/27/2010. She had a prolonged hospitalization after that. Echo performed April 2012 was normal as was a Myoview stress test. She denies chest pain or shortness of breath.  Occlusion and stenosis of carotid artery without mention of cerebral infarction History  of moderately severe bilateral ICA stenosis by duplex ultrasound last checked 03/23/17 followed by Dr. [name]      Lorretta Harp MD [facility], [facility] 12/26/2017 3:38 PM

## 2017-12-26 NOTE — Assessment & Plan Note (Signed)
History of essential hypertension blood pressure measured today at 115/72.She is on losartan and Lopressor. Continue current meds at current dosing.

## 2017-12-26 NOTE — Patient Instructions (Signed)
Medication Instructions: Your physician recommends that you continue on your current medications as directed. Please refer to the Current Medication list given to you today.   Testing/Procedures:  Your Carotid Duplex is scheduled to be done in June. The appointment date and time is listed below.  Your physician has requested that you have a renal artery duplex. During this test, an ultrasound is used to evaluate blood flow to the kidneys. Allow one hour for this exam. Do not eat after midnight the day before and avoid carbonated beverages. Take your medications as you usually do.  Follow-Up: Your physician wants you to follow-up in: 1 year with Dr. [name]. You will receive a reminder letter in the mail two months in advance. If you don't receive a letter, please call our office to schedule the follow-up appointment.  If you need a refill on your cardiac medications before your next appointment, please call your pharmacy.

## 2017-12-26 NOTE — Assessment & Plan Note (Signed)
History of CAD status post witnessed cardiac arrest, CPR or defibrillation and ultimately coronary artery bypass grafting in [facility] 09/27/2010. She had a prolonged hospitalization after that. Echo performed April 2012 was normal as was a Myoview stress test. She denies chest pain or shortness of breath.

## 2017-12-26 NOTE — Assessment & Plan Note (Addendum)
History of 75% renal artery stenosis found at the time of angiography which we followed by duplex ultrasound.

## 2017-12-26 NOTE — Assessment & Plan Note (Signed)
History of moderately severe bilateral ICA stenosis by duplex ultrasound last checked 03/23/17 followed by Dr. [name]

## 2017-12-26 NOTE — Assessment & Plan Note (Signed)
History of hyperlipidemia on statin therapy followed by her PCP. 

## 2017-12-26 NOTE — Assessment & Plan Note (Signed)
History of peripheral arterial disease status post angiography by myself 01/17/12 revealing occluded SFAs bilaterally with 2 vessel runoff on the right and one on the left via peroneal artery. She did have a 95% calcified ostial profunda femoris stenosis on the right upper arising her collaterals to her infrapopliteal vesselsas well as a 60% distal left common femoral artery stenosis. I did refer her to Dr. [name] who ultimately felt that medical therapy was her best option. She really denies spell limiting claudication at this time.

## 2017-12-27 ENCOUNTER — Other Ambulatory Visit: Payer: Self-pay | Admitting: Cardiovascular Disease

## 2017-12-29 NOTE — Telephone Encounter (Signed)
Rx(s) sent to pharmacy electronically.  

## 2018-01-03 ENCOUNTER — Other Ambulatory Visit: Payer: Self-pay | Admitting: Cardiovascular Disease

## 2018-01-08 ENCOUNTER — Ambulatory Visit (INDEPENDENT_AMBULATORY_CARE_PROVIDER_SITE_OTHER): Payer: PPO | Admitting: Ophthalmology

## 2018-01-10 ENCOUNTER — Ambulatory Visit (HOSPITAL_COMMUNITY): Admission: RE | Admit: 2018-01-10 | Payer: PPO | Source: Ambulatory Visit

## 2018-01-10 DIAGNOSIS — R0989 Other specified symptoms and signs involving the circulatory and respiratory systems: Secondary | ICD-10-CM

## 2018-01-15 ENCOUNTER — Ambulatory Visit (INDEPENDENT_AMBULATORY_CARE_PROVIDER_SITE_OTHER): Payer: PPO | Admitting: Ophthalmology

## 2018-01-31 DIAGNOSIS — F419 Anxiety disorder, unspecified: Secondary | ICD-10-CM | POA: Diagnosis not present

## 2018-02-01 ENCOUNTER — Ambulatory Visit (HOSPITAL_COMMUNITY)
Admission: RE | Admit: 2018-02-01 | Discharge: 2018-02-01 | Disposition: A | Discharge status: Home / Self Care | Payer: PPO | Source: Ambulatory Visit | Attending: Cardiovascular Disease | Admitting: Cardiovascular Disease

## 2018-02-01 DIAGNOSIS — I701 Atherosclerosis of renal artery: Secondary | ICD-10-CM | POA: Diagnosis not present

## 2018-02-12 ENCOUNTER — Encounter (INDEPENDENT_AMBULATORY_CARE_PROVIDER_SITE_OTHER): Payer: PPO | Admitting: Ophthalmology

## 2018-02-12 DIAGNOSIS — I10 Essential (primary) hypertension: Secondary | ICD-10-CM | POA: Diagnosis not present

## 2018-02-12 DIAGNOSIS — H33301 Unspecified retinal break, right eye: Secondary | ICD-10-CM | POA: Diagnosis not present

## 2018-02-12 DIAGNOSIS — H43813 Vitreous degeneration, bilateral: Secondary | ICD-10-CM | POA: Diagnosis not present

## 2018-02-12 DIAGNOSIS — E11319 Type 2 diabetes mellitus with unspecified diabetic retinopathy without macular edema: Secondary | ICD-10-CM | POA: Diagnosis not present

## 2018-02-12 DIAGNOSIS — E113291 Type 2 diabetes mellitus with mild nonproliferative diabetic retinopathy without macular edema, right eye: Secondary | ICD-10-CM | POA: Diagnosis not present

## 2018-02-12 DIAGNOSIS — E113392 Type 2 diabetes mellitus with moderate nonproliferative diabetic retinopathy without macular edema, left eye: Secondary | ICD-10-CM | POA: Diagnosis not present

## 2018-02-12 DIAGNOSIS — H35033 Hypertensive retinopathy, bilateral: Secondary | ICD-10-CM | POA: Diagnosis not present

## 2018-03-26 ENCOUNTER — Encounter (HOSPITAL_COMMUNITY): Payer: PPO

## 2018-03-26 ENCOUNTER — Ambulatory Visit: Payer: PPO | Admitting: Family

## 2018-03-30 IMAGING — DX DG ANKLE COMPLETE 3+V*R*
3 series · 3 of 3 positions shown · non-contrast
Comparison: None.

CLINICAL DATA: Right ankle pain, injury 2 days ago

EXAM:
RIGHT ANKLE - COMPLETE 3+ VIEW

[ankle ap]
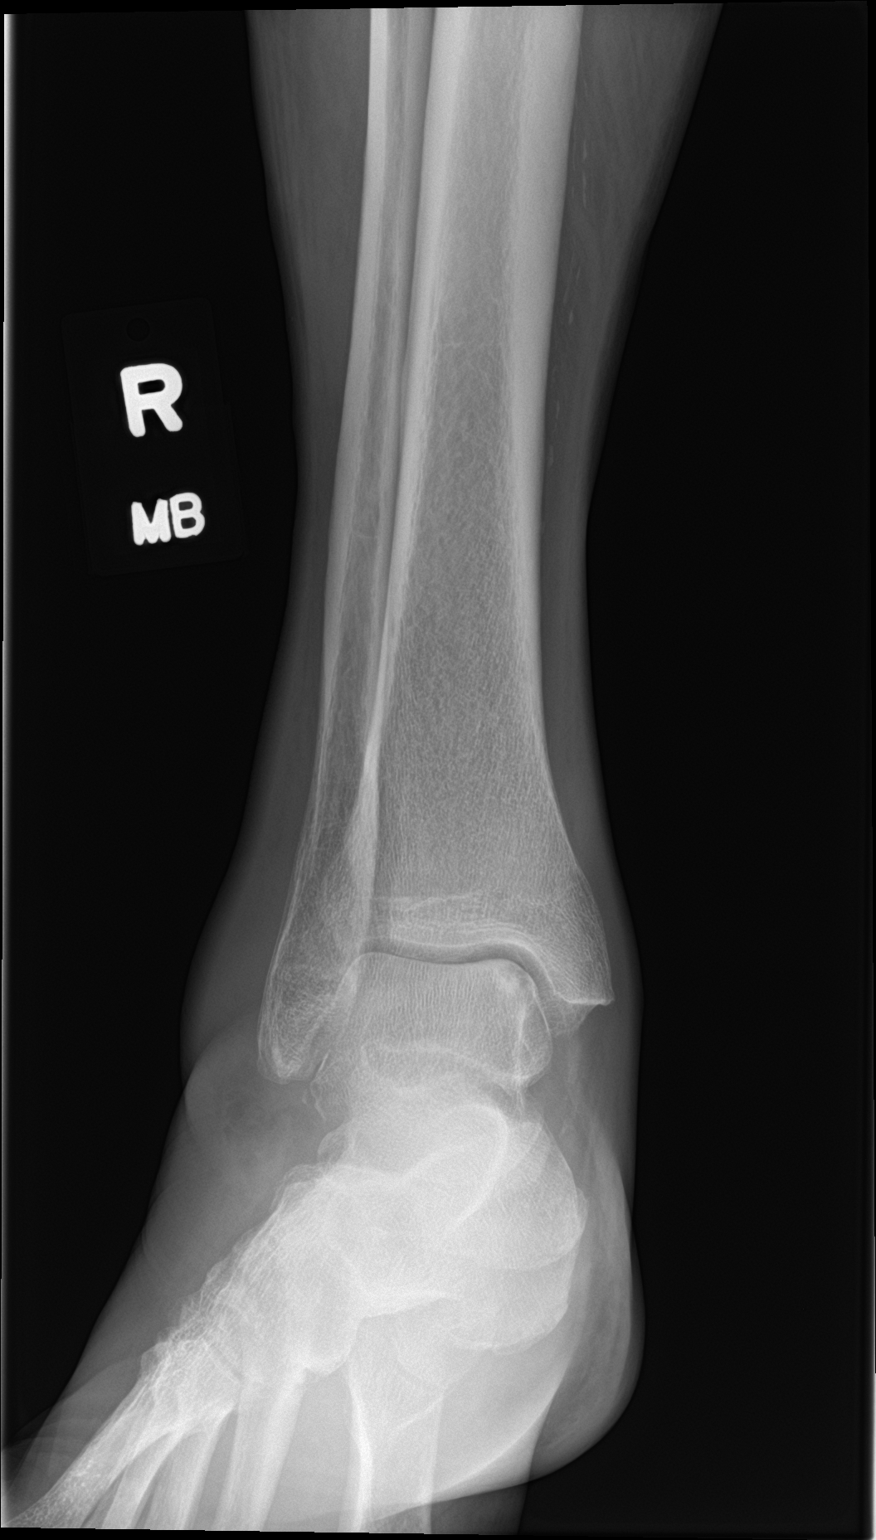

[ankle obl]
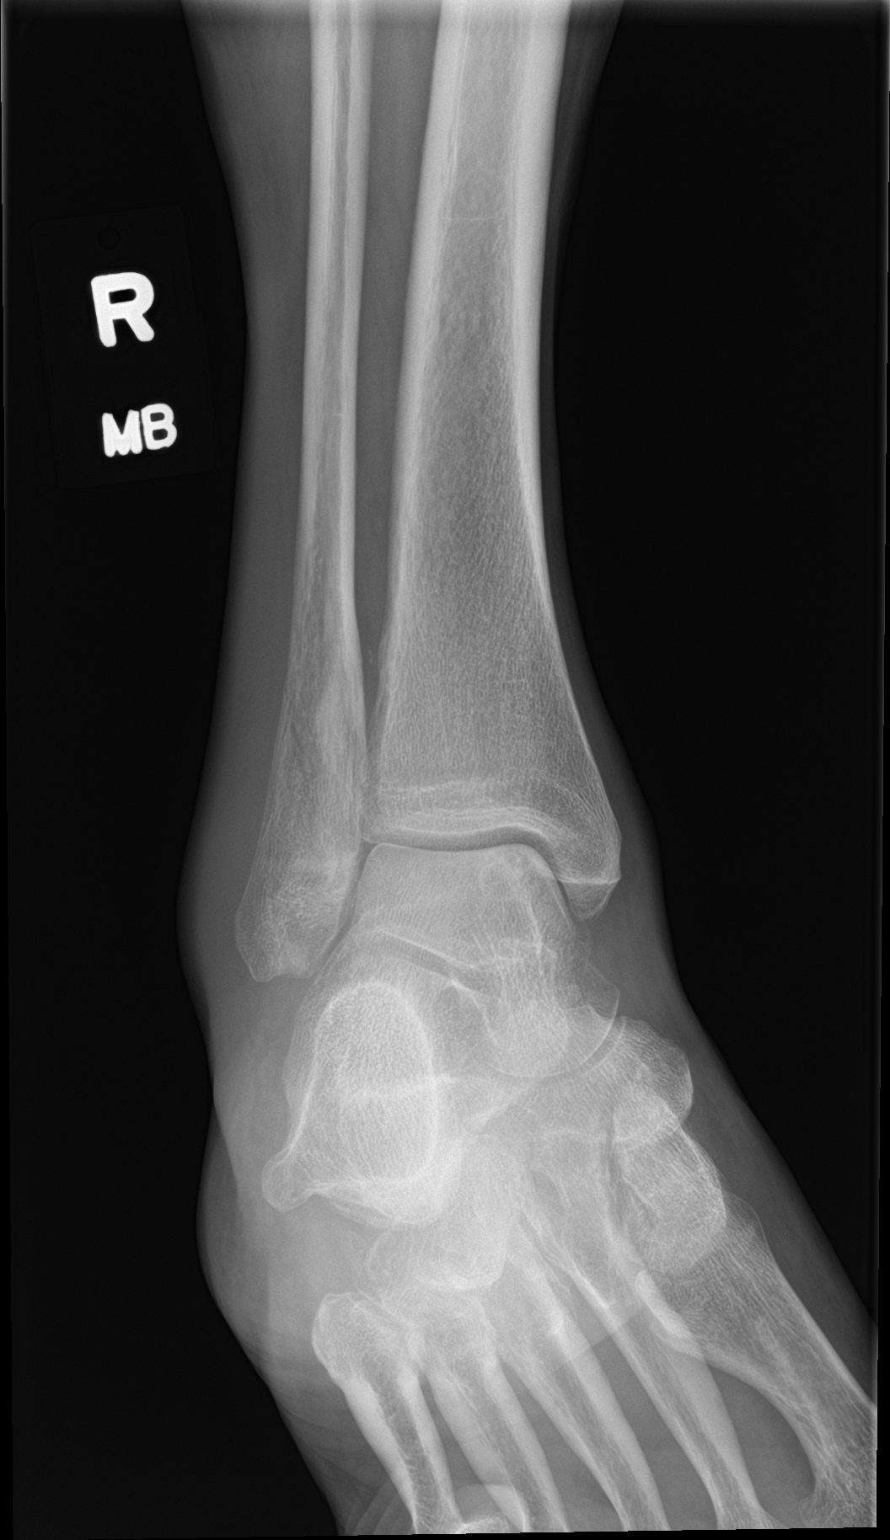

[ankle lat]
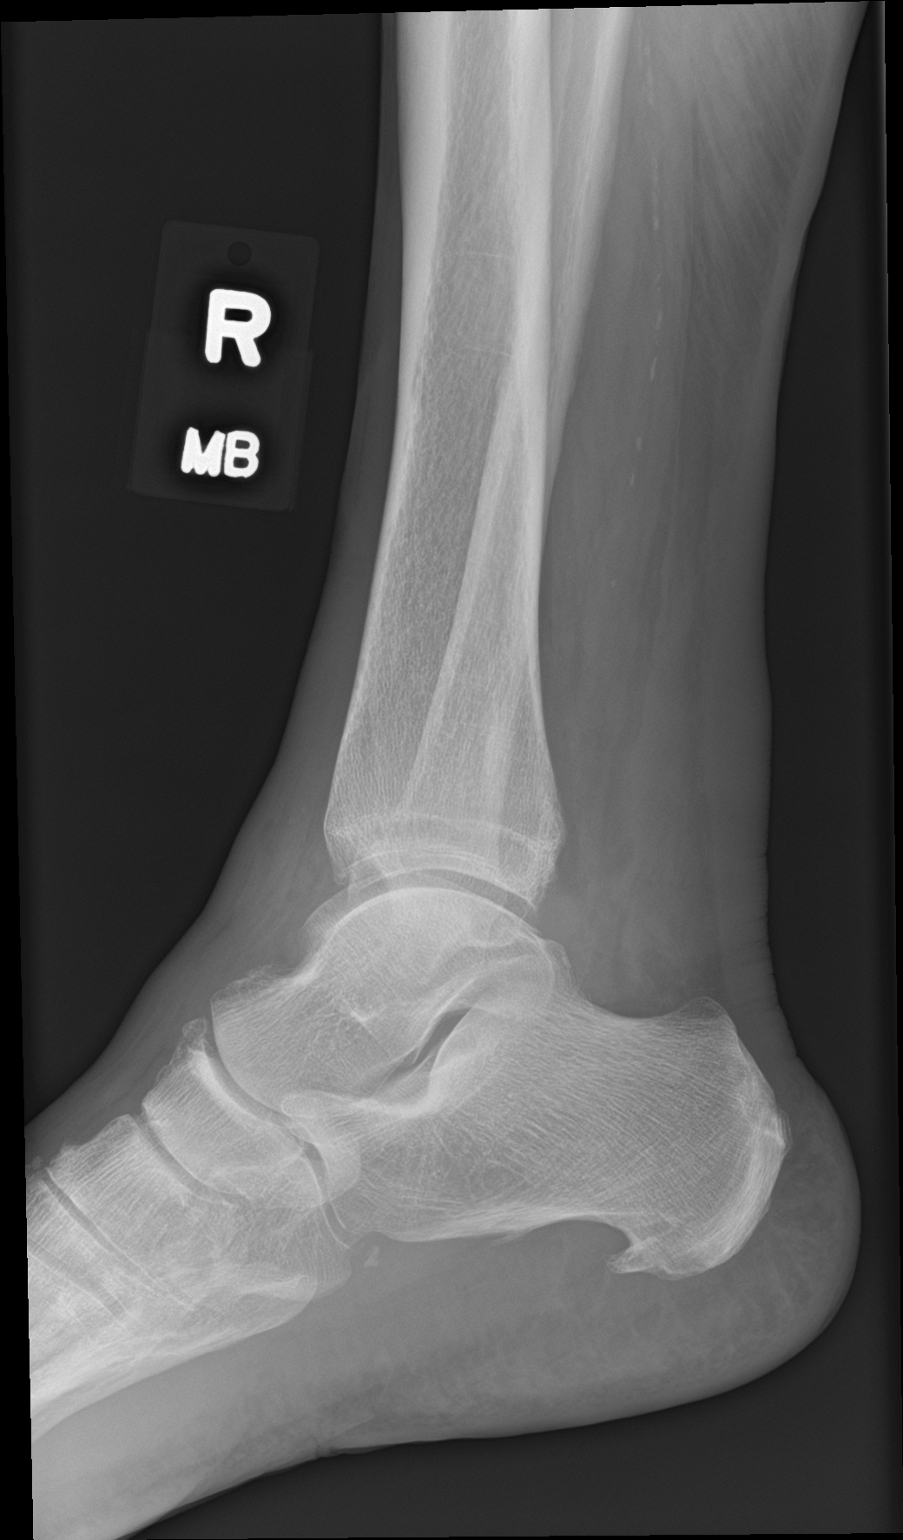

[3 of 3 positions shown; findings below may reference images not displayed]

FINDINGS: Three views of the right ankle submitted. There is nondisplaced
fracture in distal fibula with alignment preserved. Ankle mortise is
preserved. Plantar spur of calcaneus. Soft tissue swelling is noted
adjacent to lateral malleolus.
IMPRESSION: Nondisplaced fracture in distal fibula with alignment preserved.
Lateral soft tissue swelling.

## 2018-05-14 ENCOUNTER — Ambulatory Visit (INDEPENDENT_AMBULATORY_CARE_PROVIDER_SITE_OTHER)
Admission: RE | Admit: 2018-05-14 | Discharge: 2018-05-14 | Disposition: A | Discharge status: Home / Self Care | Payer: PPO | Source: Ambulatory Visit | Attending: Surgery | Admitting: Surgery

## 2018-05-14 ENCOUNTER — Encounter: Payer: Self-pay | Admitting: Family

## 2018-05-14 ENCOUNTER — Ambulatory Visit: Payer: PPO | Admitting: Family

## 2018-05-14 ENCOUNTER — Ambulatory Visit (HOSPITAL_COMMUNITY)
Admission: RE | Admit: 2018-05-14 | Discharge: 2018-05-14 | Disposition: A | Discharge status: Home / Self Care | Payer: PPO | Source: Ambulatory Visit | Attending: Surgery | Admitting: Surgery

## 2018-05-14 ENCOUNTER — Other Ambulatory Visit: Payer: Self-pay

## 2018-05-14 VITALS — BP 148/82 | HR 72 | Temp 97.7°F | Resp 16 | Ht 64.0 in | Wt 194.0 lb

## 2018-05-14 DIAGNOSIS — I6523 Occlusion and stenosis of bilateral carotid arteries: Secondary | ICD-10-CM

## 2018-05-14 DIAGNOSIS — I70209 Unspecified atherosclerosis of native arteries of extremities, unspecified extremity: Secondary | ICD-10-CM | POA: Insufficient documentation

## 2018-05-14 DIAGNOSIS — I779 Disorder of arteries and arterioles, unspecified: Secondary | ICD-10-CM | POA: Diagnosis not present

## 2018-05-14 DIAGNOSIS — Z87891 Personal history of nicotine dependence: Secondary | ICD-10-CM

## 2018-05-14 NOTE — Patient Instructions (Addendum)
Stroke Prevention Some health problems and behaviors may make it more likely for you to have a stroke. Below are ways to lessen your risk of having a stroke.  Be active for at least 30 minutes on most or all days.  Do not smoke. Try not to be around others who smoke.  Do not drink too much alcohol. ? Do not have more than 2 drinks a day if you are a man. ? Do not have more than 1 drink a day if you are a woman and are not pregnant.  Eat healthy foods, such as fruits and vegetables. If you were put on a specific diet, follow the diet as told.  Keep your cholesterol levels under control through diet and medicines. Look for foods that are low in saturated fat, trans fat, cholesterol, and are high in fiber.  If you have diabetes, follow all diet plans and take your medicine as told.  Ask your doctor if you need treatment to lower your blood pressure. If you have high blood pressure (hypertension), follow all diet plans and take your medicine as told by your doctor.  If you are 18-39 years old, have your blood pressure checked every 3-5 years. If you are age 40 or older, have your blood pressure checked every year.  Keep a healthy weight. Eat foods that are low in calories, salt, saturated fat, trans fat, and cholesterol.  Do not take drugs.  Avoid birth control pills, if this applies. Talk to your doctor about the risks of taking birth control pills.  Talk to your doctor if you have sleep problems (sleep apnea).  Take all medicine as told by your doctor. ? You may be told to take aspirin or blood thinner medicine. Take this medicine as told by your doctor. ? Understand your medicine instructions.  Make sure any other conditions you have are being taken care of.  Get help right away if:  You suddenly lose feeling (you feel numb) or have weakness in your face, arm, or leg.  Your face or eyelid hangs down to one side.  You suddenly feel confused.  You have trouble talking  (aphasia) or understanding what people are saying.  You suddenly have trouble seeing in one or both eyes.  You suddenly have trouble walking.  You are dizzy.  You lose your balance or your movements are clumsy (uncoordinated).  You suddenly have a very bad headache and you do not know the cause.  You have new chest pain.  Your heart feels like it is fluttering or skipping a beat (irregular heartbeat). Do not wait to see if the symptoms above go away. Get help right away. Call your local emergency services (911 in [place] Do not drive yourself to the hospital. This information is not intended to replace advice given to you by your health care provider. Make sure you discuss any questions you have with your health care provider. Document Released: 03/20/2012 Document Revised: 02/25/2016 Document Reviewed: 03/22/2013 Elsevier Interactive Patient Education  2018 [employer]     Peripheral Vascular Disease Peripheral vascular disease (PVD) is a disease of the blood vessels that are not part of your heart and brain. A simple term for PVD is poor circulation. In most cases, PVD narrows the blood vessels that carry blood from your heart to the rest of your body. This can result in a decreased supply of blood to your arms, legs, and internal organs, like your stomach or kidneys. However, it most often affects   a person's lower legs and feet. There are two types of PVD.  Organic PVD. This is the more common type. It is caused by damage to the structure of blood vessels.  Functional PVD. This is caused by conditions that make blood vessels contract and tighten (spasm).  Without treatment, PVD tends to get worse over time. PVD can also lead to acute ischemic limb. This is when an arm or limb suddenly has trouble getting enough blood. This is a medical emergency. Follow these instructions at home:  Take medicines only as told by your doctor.  Do not use any tobacco products, including  cigarettes, chewing tobacco, or electronic cigarettes. If you need help quitting, ask your doctor.  Lose weight if you are overweight, and maintain a healthy weight as told by your doctor.  Eat a diet that is low in fat and cholesterol. If you need help, ask your doctor.  Exercise regularly. Ask your doctor for some good activities for you.  Take good care of your feet. ? Wear comfortable shoes that fit well. ? Check your feet often for any cuts or sores. Contact a doctor if:  You have cramps in your legs while walking.  You have leg pain when you are at rest.  You have coldness in a leg or foot.  Your skin changes.  You are unable to get or have an erection (erectile dysfunction).  You have cuts or sores on your feet that are not healing. Get help right away if:  Your arm or leg turns cold and blue.  Your arms or legs become red, warm, swollen, painful, or numb.  You have chest pain or trouble breathing.  You suddenly have weakness in your face, arm, or leg.  You become very confused or you cannot speak.  You suddenly have a very bad headache.  You suddenly cannot see. This information is not intended to replace advice given to you by your health care provider. Make sure you discuss any questions you have with your health care provider. Document Released: 12/14/2009 Document Revised: 02/25/2016 Document Reviewed: 02/27/2014 Elsevier Interactive Patient Education  2017 [employer].  

## 2018-05-14 NOTE — Progress Notes (Signed)
[facility] [place] HISTORY AND PHYSICAL   CC: Follow up Extracranial Carotid Artery Stenosis and PAOD    History of Present Illness:   Alicia Crawford is a 62 y.o. female who is s/p angiography in April, 2014 by Dr. [name] for bilateral claudication. 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  She also has carotid artery disease. She returns today for follow up.  She has a history of hypertension and hyperlipidemia which are medically managed. She had a witnessed episode of cardiac arrest with CPR and defibrillation which eventually led to coronary artery bypass grafting in 2011. During this hospitalization she was told that she had already had a stroke before this, but she does not recall any symptoms of ever having a stroke. She denies any subsequent strokes or TIA's.   She has had mild expressive aphasia since the cardiac arrest. She denies any monocular loss of vision.  In 2013 she fell, hit her head, and sustained an intracranial bleed. She states that prior to her craniotomy she was able to walk 1.2 miles without stopping. She states that she would occasionally experience an aching in both of her legs. She would also have aching at night. She had previously tried Pletal but did not tolerate it. Dr. [name] decreased her dose to 50 mg twice a day. She is tolerating this.  She stopped taking Pletal on advice as one or her providers was concerned about the effect Pletal had on her depression, but after a week claudication in her legs became unbearable; she then resumed Pletal.   Patient has not had previous carotid artery intervention.  After walking for about 15 minutes, both calves ache, states this has improved; after she resumes walking she has no further claudication. She denies non healing wounds.  She had been walkingmile  daily. She does not have rest pain. She also swims 3-4 days/week for at least an hour.    Review of records, Dr. [name] has been monitoring pt's peripheral artery occlusive disease and renal artery stenosis until 2015.   Pt denies ever having DVT. She reports that she has a history of MRSA connected with a tummy tuck.  She had laser surgery to both eyes related to complication of DM. She is taking Pletal bid to help with claudication.    Diabetic: Yes, 6.1 A1C on 09-17-15 was last result on file (review of records) Tobacco use: former smoker, quit 2005  Pt meds include: Statin : Yes ASA: yes, 81 mg daily Other anticoagulants/antiplatelets: no    Past Medical History:  Diagnosis Date  . Ankle fracture 09/05/2016   right ankle  . Anoxic brain injury ([place]) 08/2010   "w/cardiac arrest"  . Anxiety Jan. 2012  . Blood transfusion 09/2010  . Cardiac arrest ([facility]) 08/04/2010  . Carotid artery occlusion    mild to moderate bilateral internal carotid artery stenosis  . Claudication ([place])   . Complication of anesthesia    [place]  . Coronary artery disease    CABG 2011 in [place]  . Coronary artery disease   . Dementia   . Depression   . DVT (deep venous thrombosis) ([place])   . High cholesterol   . Hypertension   . Myocardial infarction [facility]) 09/27/10   in [place]  . Obesity   . Peripheral vascular disease (HCC)    bilat SFA diseae  . Renal artery stenosis ([place])   .  Rotator cuff tendonitis   . Stroke [facility]) 2011  . Subdural hematoma [facility]) May 25,2013   Frontal subdural  S/P evacuation of hematoma  . Type II diabetes mellitus (HCC)    Diet and Exercise    Social History Social History   Tobacco Use  . Smoking status: Former Smoker    Packs/day: 1.00    Years: 30.00    Pack years: 30.00    Types: Cigarettes    Last attempt to quit: 06/23/2003    Years since quitting: 14.9  . Smokeless tobacco: Never Used  Substance Use Topics  . Alcohol use: Yes     Alcohol/week: 0.0 standard drinks    Comment: 01/17/12 "used to have glass of wine now & then; last wine was ~ 2011"  . Drug use: No    Family History Family History  Problem Relation Age of Onset  . Heart disease Mother        before age 62  . Diabetes Mother   . Hyperlipidemia Mother   . Hypertension Mother   . Heart attack Mother   . Heart disease Father        Before age 48  . Hyperlipidemia Father   . Hypertension Father   . Heart attack Father   . Heart disease Sister        Before age 57  . Heart attack Sister   . Hyperlipidemia Brother   . Hypertension Brother   . Heart attack Brother   . Heart disease Brother        before age 53  . Heart disease Other   . Diabetes Other   . Hypertension Other   . Alcohol abuse Other   . Mental illness Other     Surgical History Past Surgical History:  Procedure Laterality Date  . ATHERECTOMY N/A 01/17/2012   Procedure: ATHERECTOMY;  Surgeon: Lorretta Harp, MD;  [facility] C[facility] LAB;  Service: Cardiovascular;  Laterality: N/A;  . BACK SURGERY    . CHOLECYSTECTOMY  ~ 1980's  . CORONARY ARTERY BYPASS GRAFT  09/2010   CABG X4  . CRANIOTOMY  02/25/2012   Procedure: CRANIOTOMY HEMATOMA EVACUATION SUBDURAL;  Surgeon: Eustace Moore, MD;  Location: [place] NEURO ORS;  Service: Neurosurgery;  Laterality: Left;  Left craniotomy for evacuation of subdural hematoma  . LOWER EXTREMITY ANGIOGRAM  April 2013  . OVARIAN CYST REMOVAL  1983  . POSTERIOR FUSION LUMBAR SPINE  ` 2000  . SPINE SURGERY    . stomach and skin tuck  2000    Allergies  Allergen Reactions  . Halothane Hives and Other (See Comments)    Gas used 1980's Reaction: affected her liver    Current Outpatient Medications  Medication Sig Dispense Refill  . Ascorbic Acid (VITAMIN C) 100 MG tablet Take 100 mg by mouth daily.    [name] atorvastatin (LIPITOR) 80 MG tablet TAKE 1 BY MOUTH DAILY 90 tablet 2  . cilostazol (PLETAL) 100 MG tablet TAKE 1 TABLET BY MOUTH TWICE DAILY  BEFORE MEALS 180 tablet 3  . Coenzyme Q10 (EQL COQ10) 300 MG CAPS Take 1 capsule by mouth daily.    . Cyanocobalamin (VITAMIN B 12 PO) Take 1,000 mg by mouth.    . losartan (COZAAR) 50 MG tablet TAKE 1 TABLET BY MOUTH DAILY 90 tablet 3  . metFORMIN (GLUCOPHAGE) 1000 MG tablet Take 1,000 mg by mouth daily.   0  . metoprolol tartrate (LOPRESSOR) 25 MG tablet TAKE 1 TABLET(25 MG) BY MOUTH  TWICE DAILY 60 tablet 6  . Multiple Vitamin (MULITIVITAMIN WITH MINERALS) TABS Take 1 tablet by mouth daily.    [name] omega-3 fish oil (MAXEPA) 1000 MG CAPS capsule Take 1 capsule by mouth daily. Mega Red    . pantoprazole (PROTONIX) 40 MG tablet Take 1 tablet (40 mg total) by mouth daily. 90 tablet 2  . Plant Stanol Ester (BENECOL PO) Take by mouth daily.    . sertraline (ZOLOFT) 100 MG tablet Take 25 mg by mouth daily. Pt takes 25 mg daily.     No current facility-administered medications for this visit.      REVIEW OF SYSTEMS: See HPI for pertinent positives and negatives.  Physical Examination Vitals:   05/14/18 1331 05/14/18 1334  BP: 136/76 (!) 148/82  Pulse: 72   Resp: 16   Temp: 97.7 F (36.5 C)   TempSrc: Oral   SpO2: 97%   Weight: 194 lb (88 kg)   Height: 5\' 4"  (1.626 m)    Body mass index is 33.3 kg/m.  General:  WDWN obese female in NAD GAIT:normal HENT: no gross abnormalities  Eyes: PERRLA Pulmonary: Respirations are non-labored, CTAB, no rales, rhonchi, or wheezing. Cardiac: regular rhythm, no detected murmur.  VASCULAR EXAM Carotid Bruits Left Right   Negative positive    Abdominal aortic pulse is not palpable. Radial pulses are 2+ palpable and equal.      LE Pulses LEFT RIGHT   FEMORAL 1+ palpable 1+ palpable    POPLITEAL not palpable  not palpable   POSTERIOR TIBIAL not palpable  not  palpable    DORSALIS PEDIS  ANTERIOR TIBIAL 1+palpable  not palpable     Gastrointestinal: soft, nontender, BS WNL, no r/g,no palpable masses. Musculoskeletal: No muscle atrophy/wasting. M/S 5/5 throughout, Extremities without ischemic changes.  Neurologic: A&O X 3; Appropriate Affect,speech is normal, CN 2-12 intact, Pain and light touch intact in extremities, Motor exam as listed above Skin: No rash, no ulcers noted, no cellulitis.  Psychiatric: Normal thought content, mood appropriate to clinical situation.    ASSESSMENT:  Alicia Crawford is a 62 y.o. female who is s/p angiography in April, 2014 for bilateral claudication. 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  She also has carotid artery disease. She was told that she had a stroke at some point before her witnessed cardiac arrest in 2011 which eventually led to a CABG, no subsequent stroke or TIA.  She has had mild expressive aphasia since the cardiac arrest.  Her blood pressure remains in good control.   Her DM seems to be in good control and she quit tobacco use in 2008. She takes a daily ASA and a statin. She takes cilostizol which helps her claudication. She currently can walk as far as she wants with mild claudication.   She denies post prandial abdominal pain, however, abdominal duplex on 02-01-18 requested by Dr. [name] to evaluate renal arteries  shows 477 cm/s velocity in proximal SMA, renal arteries with mild stenosis. .   She has an occasional cough ever since her CABG, states she coughs so hard that she vomits at times. It might be worse after eating. She denies any known hiatal hernia, denies any known hx of GERD. She is not taking an ACEI, but is taking an ARB. I advised pt to discuss this with her PCP.    DATA  Carotid Duplex (05-14-18): 40%-59% stenosis of  the  bilateral internal carotid artery.  >50% bilateral ECA stenosis. Bilateral vertebral artery flow is antegrade.  Bilateral subclavian artery waveforms are normal.  No significant change compared to the last exams on 09-21-15, 09-19-16, and 03-23-17.   ABI (Date: 05/14/2018):  R:   ABI: 0.77 (was 0.73 on 09-19-16),   PT: mono  DP: mono  TBI:  0.81, toe pressure 105 (was 0.75)  L:   ABI: 0.91 (was 0.81),   PT: mono  DP: mono  TBI: 0.59, toe pressure 77 (was 0.63). Improved bilateral ABI and right TBI. Decline in left TBI. Moderate disease in the right, mild in the left, all monophasic waveforms.    Plan: Follow-up in 1 yearwith Carotid Duplex scan and ABI, pt states Dr. [name] has deferred monitoring of her peripheral artery disease to [place], he has not requested renal nor PAD studies since 2015.  I advised pt to notify us if she develops concerns re the circulation in her feet or legs.   Daily seated legs exercises discussed and demonstrated. Graduated walking program discussed.    I discussed in depth with the patient the nature of atherosclerosis, and emphasized the importance of maximal medical management including strict control of blood pressure, blood glucose, and lipid levels, obtaining regular exercise, and cessation of smoking.  The patient is aware that without maximal medical management the underlying atherosclerotic disease process will progress, limiting the benefit of any interventions.  The patient was given information about stroke prevention and what symptoms should prompt the patient to seek immediate medical care.  The patient was given information about PAD including signs, symptoms, treatment, what symptoms should prompt the patient to seek immediate medical care, and risk reduction measures to take.  Thank you for allowing [place] to participate in this patient's care.  Clemon Chambers, RN, MSN, FNP-C [facility] Office:  [phone]  Clinic MD: [name] 05/14/2018 1:55 PM

## 2018-05-28 ENCOUNTER — Other Ambulatory Visit: Payer: Self-pay | Admitting: Cardiovascular Disease

## 2018-06-01 DIAGNOSIS — F419 Anxiety disorder, unspecified: Secondary | ICD-10-CM | POA: Diagnosis not present

## 2018-06-12 ENCOUNTER — Other Ambulatory Visit: Payer: Self-pay | Admitting: Cardiovascular Disease

## 2018-07-11 ENCOUNTER — Other Ambulatory Visit: Payer: Self-pay | Admitting: Cardiovascular Disease

## 2018-07-13 ENCOUNTER — Other Ambulatory Visit: Payer: Self-pay | Admitting: Cardiovascular Disease

## 2018-08-24 ENCOUNTER — Ambulatory Visit: Payer: PPO | Admitting: Psychiatry

## 2018-08-24 DIAGNOSIS — F325 Major depressive disorder, single episode, in full remission: Secondary | ICD-10-CM | POA: Diagnosis not present

## 2018-08-24 DIAGNOSIS — G4733 Obstructive sleep apnea (adult) (pediatric): Secondary | ICD-10-CM | POA: Diagnosis not present

## 2018-08-24 MED ORDER — SERTRALINE HCL 100 MG PO TABS: 200.0000 mg | ORAL_TABLET | Freq: Every day | ORAL | 2 refills | Status: DC

## 2018-08-24 NOTE — Progress Notes (Signed)
[facility]  Patient ID: Alicia Crawford,  MRN: [id number]  PCP: Eunice Blase, MD  Date of Evaluation: 08/24/2018 Time spent:20 minutes  Chief Complaint:   HISTORY/CURRENT STATUS: HPI seen 06/01/2018.  She was doing well at the time.  Diagnoses include major depressive disorder and sleep apnea.  At her past visit she had problems with her CPAP was coughing and at times took the CPAP off.  To note  [facility] to check equipment.  When problem corrected we will get a compliance O2 sats and a pulse. Cough started years ago, before she ever started cpap. Continues to do well.  Individual Medical History/ Review of Systems: Changes? :  Past medical history includes diabetes, hypercholesterolemia, coronary artery disease, quadruple bypass, cardiac arrest, probable prior strokes. If go on Prozac again should not over 50 mg.  Due to possible brain injury.  If uses bzd, keep at low dose due to ae's.  Allergies: Halothane  Current Medications:  Current Outpatient Medications:  .  Ascorbic Acid (VITAMIN C) 100 MG tablet, Take 100 mg by mouth daily., Disp: , Rfl:  .  atorvastatin (LIPITOR) 80 MG tablet, TAKE 1 TABLET BY MOUTH DAILY, Disp: 90 tablet, Rfl: 1 .  cilostazol (PLETAL) 100 MG tablet, TAKE 1 TABLET BY MOUTH TWICE DAILY BEFORE MEALS, Disp: 180 tablet, Rfl: 3 .  Coenzyme Q10 (EQL COQ10) 300 MG CAPS, Take 1 capsule by mouth daily., Disp: , Rfl:  .  Cyanocobalamin (VITAMIN B 12 PO), Take 1,000 mg by mouth., Disp: , Rfl:  .  losartan (COZAAR) 50 MG tablet, TAKE 1 TABLET BY MOUTH DAILY, Disp: 90 tablet, Rfl: 3 .  metFORMIN (GLUCOPHAGE) 1000 MG tablet, Take 1,000 mg by mouth daily. , Disp: , Rfl: 0 .  metoprolol tartrate (LOPRESSOR) 25 MG tablet, TAKE 1 TABLET(25 MG) BY MOUTH TWICE DAILY, Disp: 60 tablet, Rfl: 5 .  metoprolol tartrate (LOPRESSOR) 25 MG tablet, TAKE 1 TABLET(25 MG) BY MOUTH TWICE DAILY, Disp: 60 tablet, Rfl: 0 .  Multiple Vitamin (MULITIVITAMIN WITH  MINERALS) TABS, Take 1 tablet by mouth daily., Disp: , Rfl:  .  omega-3 fish oil (MAXEPA) 1000 MG CAPS capsule, Take 1 capsule by mouth daily. Mega Red, Disp: , Rfl:  .  pantoprazole (PROTONIX) 40 MG tablet, Take 1 tablet (40 mg total) by mouth daily., Disp: 90 tablet, Rfl: 2 .  Plant Stanol Ester (BENECOL PO), Take by mouth daily., Disp: , Rfl:  .  sertraline (ZOLOFT) 100 MG tablet, Take 2 tablets (200 mg total) by mouth daily., Disp: 60 tablet, Rfl: 2 Medication Side Effects: none  Family Medical/ Social History: Changes? no  MENTAL HEALTH EXAM:  There were no vitals taken for this visit.There is no height or weight on file to calculate BMI.  General Appearance: Casual  Eye Contact:  Good  Speech:  Normal Rate  Volume:  Normal  Mood:  Euthymic  Affect:  Appropriate  Thought Process:  Linear  Orientation:  Full (Time, Place, and Person)  Thought Content: Logical   Suicidal Thoughts:  No  Homicidal Thoughts:  No  Memory:  WNL  Judgement:  Good  Insight:  Good  Psychomotor Activity:  Normal  Concentration:  Concentration: Good  Recall:  Good  Fund of Knowledge: Good  Language: Good  Assets:  Desire for Improvement  ADL's:  Intact  Cognition: WNL  Prognosis:  Good    DIAGNOSES:    ICD-10-CM   1. Major depressive disorder in full remission, unspecified whether  recurrent ([place]) F32.5   2. OSA (obstructive sleep apnea) G47.33     Receiving Psychotherapy: No    RECOMMENDATIONS: She is to continue on her Zoloft 200 mg a day.  She is about to get new cpap supplies from [facility]. Will order compliance, O2 sats, pulse in 6 weeks.suspect will always have coughing as it started years ago before started cpap. In the future if she needs benzodiazepines should keep a low dose due to risk of side effects. Recheck 3 months.   Comer Locket, PA-C

## 2018-08-28 DIAGNOSIS — G4733 Obstructive sleep apnea (adult) (pediatric): Secondary | ICD-10-CM | POA: Diagnosis not present

## 2018-09-14 ENCOUNTER — Other Ambulatory Visit: Payer: Self-pay | Admitting: Cardiovascular Disease

## 2018-09-14 NOTE — Telephone Encounter (Signed)
Rx request sent to pharmacy.  

## 2018-10-03 DIAGNOSIS — H35033 Hypertensive retinopathy, bilateral: Secondary | ICD-10-CM

## 2018-10-03 HISTORY — DX: Hypertensive retinopathy, bilateral: H35.033

## 2018-10-08 ENCOUNTER — Other Ambulatory Visit: Payer: Self-pay | Admitting: Family Medicine

## 2018-10-08 ENCOUNTER — Other Ambulatory Visit (HOSPITAL_COMMUNITY)
Admission: RE | Admit: 2018-10-08 | Discharge: 2018-10-08 | Disposition: A | Discharge status: Home / Self Care | Payer: PPO | Source: Ambulatory Visit | Attending: Family Medicine | Admitting: Family Medicine

## 2018-10-08 DIAGNOSIS — F329 Major depressive disorder, single episode, unspecified: Secondary | ICD-10-CM | POA: Diagnosis not present

## 2018-10-08 DIAGNOSIS — Z01411 Encounter for gynecological examination (general) (routine) with abnormal findings: Secondary | ICD-10-CM | POA: Diagnosis not present

## 2018-10-08 DIAGNOSIS — I739 Peripheral vascular disease, unspecified: Secondary | ICD-10-CM | POA: Diagnosis not present

## 2018-10-08 DIAGNOSIS — E785 Hyperlipidemia, unspecified: Secondary | ICD-10-CM | POA: Diagnosis not present

## 2018-10-08 DIAGNOSIS — E11319 Type 2 diabetes mellitus with unspecified diabetic retinopathy without macular edema: Secondary | ICD-10-CM | POA: Diagnosis not present

## 2018-10-08 DIAGNOSIS — Z124 Encounter for screening for malignant neoplasm of cervix: Secondary | ICD-10-CM | POA: Diagnosis not present

## 2018-10-08 DIAGNOSIS — E1169 Type 2 diabetes mellitus with other specified complication: Secondary | ICD-10-CM | POA: Diagnosis not present

## 2018-10-08 DIAGNOSIS — I10 Essential (primary) hypertension: Secondary | ICD-10-CM | POA: Diagnosis not present

## 2018-10-08 DIAGNOSIS — Z0000 Encounter for general adult medical examination without abnormal findings: Secondary | ICD-10-CM | POA: Diagnosis not present

## 2018-10-11 LAB — CYTOLOGY - PAP
Diagnosis: NEGATIVE
HPV: NOT DETECTED

## 2018-10-15 ENCOUNTER — Encounter: Payer: Self-pay | Admitting: Emergency Medicine

## 2018-10-15 DIAGNOSIS — F329 Major depressive disorder, single episode, unspecified: Secondary | ICD-10-CM | POA: Insufficient documentation

## 2018-11-25 ENCOUNTER — Other Ambulatory Visit: Payer: Self-pay | Admitting: Cardiovascular Disease

## 2018-11-27 ENCOUNTER — Ambulatory Visit: Payer: PPO | Admitting: Psychiatry

## 2018-12-05 ENCOUNTER — Ambulatory Visit (INDEPENDENT_AMBULATORY_CARE_PROVIDER_SITE_OTHER): Payer: PPO

## 2018-12-05 ENCOUNTER — Ambulatory Visit (HOSPITAL_COMMUNITY)
Admission: EM | Admit: 2018-12-05 | Discharge: 2018-12-05 | Disposition: A | Discharge status: Home / Self Care | Payer: PPO | Attending: Family Medicine | Admitting: Family Medicine

## 2018-12-05 ENCOUNTER — Encounter (HOSPITAL_COMMUNITY): Payer: Self-pay

## 2018-12-05 DIAGNOSIS — M79672 Pain in left foot: Secondary | ICD-10-CM | POA: Diagnosis not present

## 2018-12-05 DIAGNOSIS — M7732 Calcaneal spur, left foot: Secondary | ICD-10-CM | POA: Diagnosis not present

## 2018-12-05 NOTE — ED Triage Notes (Signed)
Pt presents with left foot pain from unknown source.

## 2018-12-05 NOTE — ED Provider Notes (Signed)
[place]    CSN: [id number] Arrival date & time: 12/05/18  1358     History   Chief Complaint Chief Complaint  Patient presents with  . Foot Pain    HPI Alicia Crawford is a 63 y.o. female.   Patient is a 63 year old female with a significant past medical history.  She presents with 3 days of left lateral foot pain more in the midfoot.  No significant swelling or erythema.  No bruising or deformities.  Denies any injury to the foot. She does have hx of PVD and diabetes. No wounds or ulcers to the foot. No fevers. The pain is worse with walking on the foot. She has not taken anything for the pain.   ROS per HPI      Past Medical History:  Diagnosis Date  . Ankle fracture 09/05/2016   right ankle  . Anoxic brain injury ([place]) 08/2010   "w/cardiac arrest"  . Anxiety Jan. 2012  . Blood transfusion 09/2010  . Cardiac arrest ([place]) 08/04/2010  . Carotid artery occlusion    mild to moderate bilateral internal carotid artery stenosis  . Claudication ([place])   . Complication of anesthesia    [place]  . Coronary artery disease    CABG 2011 in [place]  . Coronary artery disease   . Dementia   . Depression   . DVT (deep venous thrombosis) ([place])   . High cholesterol   . Hypertension   . Myocardial infarction [facility]) 09/27/10   in [place]  . Obesity   . Peripheral vascular disease (HCC)    bilat SFA diseae  . Renal artery stenosis ([place])   . Rotator cuff tendonitis   . Stroke [facility]) 2011  . Subdural hematoma [facility]) May 25,2013   Frontal subdural  S/P evacuation of hematoma  . Type II diabetes mellitus (HCC)    Diet and Exercise    Patient Active Problem List   Diagnosis Date Noted  . MDD (major depressive disorder) 10/15/2018  . Peripheral vascular disease, unspecified ([place]) 03/14/2014  . Aftercare following surgery of the circulatory system, [place] 03/14/2014  . Occlusion and stenosis of carotid artery without mention of cerebral infarction  02/11/2013  . SDH (subdural hematoma) ([place]) 03/02/2012  . S/P craniotomy, 02/25/12 after fall, SDH 02/27/2012  . Bradycardia 02/27/2012  . Obesity 02/27/2012  . Diabetes mellitus 02/27/2012  . CAD, cardiac arrest, CPR, CABG in [facility] Dec 2011-NL LVF by Echo, neg Nuc April 2013 02/27/2012  . Anxiety, ? residual hypoxic brain inhury from cardiac arrest 2011 02/27/2012  . Peripheral artery disease, bilta LE PVD by angio April 2013 01/18/2012  . HLD (hyperlipidemia) 01/18/2012  . HTN (hypertension) 01/18/2012  . Renal artery stenosis, Right, 75% 01/18/2012    Past Surgical History:  Procedure Laterality Date  . ATHERECTOMY N/A 01/17/2012   Procedure: ATHERECTOMY;  Surgeon: Lorretta Harp, MD;  Location: [facility] CATH LAB;  Service: Cardiovascular;  Laterality: N/A;  . BACK SURGERY    . CHOLECYSTECTOMY  ~ 1980's  . CORONARY ARTERY BYPASS GRAFT  09/2010   CABG X4  . CRANIOTOMY  02/25/2012   Procedure: CRANIOTOMY HEMATOMA EVACUATION SUBDURAL;  Surgeon: Eustace Moore, MD;  Location: [place] NEURO ORS;  Service: Neurosurgery;  Laterality: Left;  Left craniotomy for evacuation of subdural hematoma  . LOWER EXTREMITY ANGIOGRAM  April 2013  . OVARIAN CYST REMOVAL  1983  . POSTERIOR FUSION LUMBAR SPINE  ` 2000  . SPINE SURGERY    .  stomach and skin tuck  2000    OB History   No obstetric history on file.      Home Medications    Prior to Admission medications   Medication Sig Start Date End Date Taking? Authorizing Provider  Ascorbic Acid (VITAMIN C) 100 MG tablet Take 100 mg by mouth daily.    [provider]  atorvastatin (LIPITOR) 80 MG tablet TAKE 1 TABLET BY MOUTH DAILY 11/28/18   Lorretta Harp, MD  cilostazol (PLETAL) 100 MG tablet TAKE 1 TABLET BY MOUTH TWICE DAILY BEFORE MEALS 01/03/18   Lorretta Harp, MD  Coenzyme Q10 (EQL COQ10) 300 MG CAPS Take 1 capsule by mouth daily.    [provider]  Cyanocobalamin (VITAMIN B 12 PO) Take 1,000 mg by mouth.    [provider]  losartan (COZAAR) 50 MG tablet TAKE 1 TABLET BY MOUTH DAILY 01/03/18   Lorretta Harp, MD  metFORMIN (GLUCOPHAGE) 1000 MG tablet Take 1,000 mg by mouth daily.  01/05/15   [provider]  metoprolol tartrate (LOPRESSOR) 25 MG tablet TAKE 1 TABLET(25 MG) BY MOUTH TWICE DAILY 07/11/18   Lorretta Harp, MD  metoprolol tartrate (LOPRESSOR) 25 MG tablet TAKE 1 TABLET(25 MG) BY MOUTH TWICE DAILY 07/13/18   Lorretta Harp, MD  metoprolol tartrate (LOPRESSOR) 25 MG tablet TAKE 1 TABLET(25 MG) BY MOUTH TWICE DAILY 09/14/18   Lorretta Harp, MD  Multiple Vitamin (MULITIVITAMIN WITH MINERALS) TABS Take 1 tablet by mouth daily.    [provider]  omega-3 fish oil (MAXEPA) 1000 MG CAPS capsule Take 1 capsule by mouth daily. Mega Red    [provider]  pantoprazole (PROTONIX) 40 MG tablet Take 1 tablet (40 mg total) by mouth daily. 12/06/16   Lorretta Harp, MD  Plant Stanol Ester (BENECOL PO) Take by mouth daily.    [provider]  sertraline (ZOLOFT) 100 MG tablet Take 2 tablets (200 mg total) by mouth daily. 08/24/18   Comer Locket, PA-C    Family History Family History  Problem Relation Age of Onset  . Heart disease Mother        before age 38  . Diabetes Mother   . Hyperlipidemia Mother   . Hypertension Mother   . Heart attack Mother   . Heart disease Father        Before age 70  . Hyperlipidemia Father   . Hypertension Father   . Heart attack Father   . Heart disease Sister        Before age 3  . Heart attack Sister   . Hyperlipidemia Brother   . Hypertension Brother   . Heart attack Brother   . Heart disease Brother        before age 87  . Heart disease Other   . Diabetes Other   . Hypertension Other   . Alcohol abuse Other   . Mental illness Other     Social History Social History   Tobacco Use  . Smoking status: Former Smoker    Packs/day: 1.00    Years: 30.00    Pack years: 30.00    Types: Cigarettes     Last attempt to quit: 06/23/2003    Years since quitting: 15.4  . Smokeless tobacco: Never Used  Substance Use Topics  . Alcohol use: Yes    Alcohol/week: 0.0 standard drinks    Comment: 01/17/12 "used to have glass of wine now & then; last wine was ~  2011"  . Drug use: No     Allergies   Halothane   Review of Systems Review of Systems   Physical Exam Triage Vital Signs ED Triage Vitals  Enc Vitals Group     BP 12/05/18 1410 131/80     Pulse Rate 12/05/18 1410 69     Resp 12/05/18 1410 18     Temp 12/05/18 1410 98 F (36.7 C)     Temp Source 12/05/18 1410 Oral     SpO2 12/05/18 1410 98 %     Weight --      Height --      Head Circumference --      Peak Flow --      Pain Score 12/05/18 1409 9     Pain Loc --      Pain Edu? --      Excl. in [place]? --    No data found.  Updated Vital Signs BP 131/80 (BP Location: Left Arm)   Pulse 69   Temp 98 F (36.7 C) (Oral)   Resp 18   SpO2 98%   Visual Acuity Right Eye Distance:   Left Eye Distance:   Bilateral Distance:    Right Eye Near:   Left Eye Near:    Bilateral Near:     Physical Exam Vitals signs and nursing note reviewed.  Constitutional:      Appearance: Normal appearance.  HENT:     Head: Normocephalic.  Neck:     Musculoskeletal: Normal range of motion.  Pulmonary:     Effort: Pulmonary effort is normal.  Musculoskeletal: Normal range of motion.        General: Tenderness present.     Comments: Tender to palpation of left lateral midfoot without erythema, deformities, swelling or bruising. More pain elicited with bearing weight Good range of motion of the foot. Sensation and pedal pulse intact. Good color and temperature no obvious lesions  Skin:    General: Skin is warm and dry.  Neurological:     Mental Status: She is alert.  Psychiatric:        Mood and Affect: Mood normal.      UC Treatments / Results  Labs (all labs ordered are listed, but only abnormal results are displayed) Labs  Reviewed - No data to display  EKG None  Radiology Dg Foot Complete Left  Result Date: 12/05/2018 CLINICAL DATA:  Left foot pain and difficulty bearing weight, no known injury, initial encounter EXAM: LEFT FOOT - COMPLETE 3+ VIEW COMPARISON:  None. FINDINGS: Calcaneal spurs are noted. No acute fracture or dislocation is seen. No gross soft tissue abnormality is noted. IMPRESSION: No acute abnormality noted. Electronically Signed   By: Inez Catalina M.D.   On: 12/05/2018 15:33    Procedures Procedures (including critical care time)  Medications Ordered in UC Medications - No data to display  Initial Impression / Assessment and Plan / UC Course  I have reviewed the triage vital signs and the nursing notes.  Pertinent labs & imaging results that were available during my care of the patient were reviewed by me and considered in my medical decision making (see chart for details).     Xray negative Most likely some tendonitis RICE Tylenol for pain  Follow up as needed for continued or worsening symptoms  Final Clinical Impressions(s) / UC Diagnoses   Final diagnoses:  Foot pain, left     Discharge Instructions     Your foot x ray was  normal Rest, ice , elevate the foot.  You can take tylenol extra strength for the pain.  Follow up as needed for continued or worsening symptoms     ED Prescriptions    None     Controlled Substance Prescriptions [place] Controlled Substance Registry consulted? Not Applicable   Orvan July, NP 12/05/18 [phone]

## 2018-12-05 NOTE — Discharge Instructions (Signed)
Your foot x ray was normal Rest, ice , elevate the foot.  You can take tylenol extra strength for the pain.  Follow up as needed for continued or worsening symptoms

## 2018-12-25 ENCOUNTER — Other Ambulatory Visit: Payer: Self-pay | Admitting: Cardiovascular Disease

## 2019-01-24 DIAGNOSIS — E785 Hyperlipidemia, unspecified: Secondary | ICD-10-CM | POA: Diagnosis not present

## 2019-01-24 DIAGNOSIS — I10 Essential (primary) hypertension: Secondary | ICD-10-CM | POA: Diagnosis not present

## 2019-01-24 DIAGNOSIS — M79673 Pain in unspecified foot: Secondary | ICD-10-CM | POA: Diagnosis not present

## 2019-01-24 DIAGNOSIS — E1169 Type 2 diabetes mellitus with other specified complication: Secondary | ICD-10-CM | POA: Diagnosis not present

## 2019-01-25 ENCOUNTER — Other Ambulatory Visit: Payer: Self-pay | Admitting: Cardiovascular Disease

## 2019-01-25 NOTE — Telephone Encounter (Signed)
Losartan refilled 

## 2019-01-31 ENCOUNTER — Other Ambulatory Visit: Payer: Self-pay

## 2019-01-31 MED ORDER — SERTRALINE HCL 100 MG PO TABS: 200.0000 mg | ORAL_TABLET | Freq: Every day | ORAL | 0 refills | Status: DC

## 2019-02-12 DIAGNOSIS — E1165 Type 2 diabetes mellitus with hyperglycemia: Secondary | ICD-10-CM | POA: Diagnosis not present

## 2019-02-12 DIAGNOSIS — Z7984 Long term (current) use of oral hypoglycemic drugs: Secondary | ICD-10-CM | POA: Diagnosis not present

## 2019-02-12 DIAGNOSIS — N390 Urinary tract infection, site not specified: Secondary | ICD-10-CM | POA: Diagnosis not present

## 2019-02-18 ENCOUNTER — Telehealth: Payer: Self-pay | Admitting: Cardiovascular Disease

## 2019-02-18 NOTE — Telephone Encounter (Signed)
New Message     Pt is returning call about her appt   Please call back

## 2019-02-19 ENCOUNTER — Telehealth: Payer: Self-pay

## 2019-02-19 ENCOUNTER — Telehealth (INDEPENDENT_AMBULATORY_CARE_PROVIDER_SITE_OTHER): Payer: PPO | Admitting: Cardiovascular Disease

## 2019-02-19 ENCOUNTER — Encounter: Payer: Self-pay | Admitting: Psychiatry

## 2019-02-19 ENCOUNTER — Ambulatory Visit: Payer: PPO | Admitting: Psychiatry

## 2019-02-19 ENCOUNTER — Other Ambulatory Visit: Payer: Self-pay

## 2019-02-19 VITALS — BP 151/74 | HR 80 | Ht 64.0 in | Wt 195.0 lb

## 2019-02-19 DIAGNOSIS — I2510 Atherosclerotic heart disease of native coronary artery without angina pectoris: Secondary | ICD-10-CM

## 2019-02-19 DIAGNOSIS — I739 Peripheral vascular disease, unspecified: Secondary | ICD-10-CM | POA: Diagnosis not present

## 2019-02-19 DIAGNOSIS — E782 Mixed hyperlipidemia: Secondary | ICD-10-CM | POA: Diagnosis not present

## 2019-02-19 DIAGNOSIS — I10 Essential (primary) hypertension: Secondary | ICD-10-CM | POA: Diagnosis not present

## 2019-02-19 DIAGNOSIS — F325 Major depressive disorder, single episode, in full remission: Secondary | ICD-10-CM

## 2019-02-19 DIAGNOSIS — I6523 Occlusion and stenosis of bilateral carotid arteries: Secondary | ICD-10-CM

## 2019-02-19 MED ORDER — SERTRALINE HCL 100 MG PO TABS: 200.0000 mg | ORAL_TABLET | Freq: Every day | ORAL | 2 refills | Status: DC

## 2019-02-19 NOTE — Telephone Encounter (Signed)
Patient and/or DPR-approved person aware of AVS instructions and verbalized understanding. AVS TO [facility] 5/19

## 2019-02-19 NOTE — Patient Instructions (Addendum)
Medication Instructions:  Your physician recommends that you continue on your current medications as directed. Please refer to the Current Medication list given to you today.  If you need a refill on your cardiac medications before your next appointment, please call your pharmacy.   Lab work: NONE If you have labs (blood work) drawn today and your tests are completely normal, you will receive your results only by: [name] MyChart Message (if you have MyChart) OR . A paper copy in the mail If you have any lab test that is abnormal or we need to change your treatment, we will call you to review the results.  Testing/Procedures: Your physician has requested that you have a carotid duplex. This test is an ultrasound of the carotid arteries in your neck. It looks at blood flow through these arteries that supply the brain with blood. Allow one hour for this exam. There are no restrictions or special instructions. YOU WILL BE CONTACTED BY A SCHEDULER TO SET UP AN APPOINTMENT FOR AUGUST 2020   Follow-Up: At [facility], you and your health needs are our priority.  As part of our continuing mission to provide you with exceptional heart care, we have created designated Provider Care Teams.  These Care Teams include your primary Cardiologist (physician) and Advanced Practice Providers (APPs 2-  Physician Assistants and Nurse Practitioners) who all work together to provide you with the care you need, when you need it. You will need a follow up appointment in 12 months WITH DR. [name].  Please call our office 2 months in advance to schedule this appointment.    Any Other Special Instructions Will Be Listed Below (If Applicable). YOU WILL BE CONTACTED BY A CLINICAL PHARMACIST TO DISCUSS REPATHA, A MEDICATION THAT HELPS LOWER "BAD" CHOLESTEROL.

## 2019-02-19 NOTE — Addendum Note (Signed)
Addended by: Annita Brod on: 02/19/2019 03:27 PM   Modules accepted: Orders

## 2019-02-19 NOTE — Progress Notes (Signed)
Virtual Visit via Telephone Note   This visit type was conducted due to national recommendations for restrictions regarding the COVID-19 Pandemic (e.g. social distancing) in an effort to limit this patient's exposure and mitigate transmission in our community.  Due to her co-morbid illnesses, this patient is at least at moderate risk for complications without adequate follow up.  This format is felt to be most appropriate for this patient at this time.  The patient did not have access to video technology/had technical difficulties with video requiring transitioning to audio format only (telephone).  All issues noted in this document were discussed and addressed.  No physical exam could be performed with this format.  Please refer to the patient's chart for her  consent to telehealth for [facility].   Date:  02/19/2019   ID:  Alicia Crawford, DOB Feb 21, 1956, MRN [id number]  Patient Location: Home Provider Location: Home  PCP:  Eunice Blase, MD  Cardiologist: Dr. Quay Burow Electrophysiologist:  None   Evaluation Performed:  Follow-Up Visit  Chief Complaint: 1 year follow-up CAD and PAD  History of Present Illness:    Alicia Crawford is a 63 y.o.  moderately overweight, married Caucasian female, mother of 3 who I last saw in the office 12/26/2017... She has a history of remote tobacco abuse having quit 10 years ago, hypertension, hyperlipidemia and a family history of heart disease with both mother and father who had CAD and a brother who had a stent. She had an episode of witnessed cardiac arrest, CPR, defibrillation and ultimately coronary artery bypass grafting in [facility] September 27, 2010. She had prolonged hospitalization on a ventilator and rehab. Echo performed April 2012 showed normal LV systolic function. A Myoview was normal as well. She otherwise is asymptomatic except for lifestyle-limiting claudication. Dopplers performed in our office December 06, 2011, revealed ABIs of  0.46 on the right with an occluded SFA and high-grade distal right common, profunda stenosis, left ABI of 0.62 with an occluded left SFA. I angiogram'd her January 17, 2012, demonstrating occluded SFAs bilaterally with 2-vessel runoff on the right, 1 on the left via peroneal. She did have a 95% calcified ostial profunda femoris stenosis on the right jeopardizing collaterals to her infrapopliteal vessels as well as a 60% distal left common femoral artery stenosis. She also had an incidentally noted 35% right renal artery stenosis. I reviewed the films with Dr. [name] who felt surgical revascularization was optimal. She in the interim developed a subdural hematoma after falling and hitting her head and her workup was put on hold. Today in the office she continues to complain of lifestyle-limiting claudication. She apparently saw Dr. [name] who recommended continued medical/conservative therapy.  Since I saw her back a year ago she is remained asymptomatic.  She denies chest pain, shortness of breath or claudication.  She is sheltering in place and socially distancing.  The patient does not have symptoms concerning for COVID-19 infection (fever, chills, cough, or new shortness of breath).    Past Medical History:  Diagnosis Date  . Ankle fracture 09/05/2016   right ankle  . Anoxic brain injury ([place]) 08/2010   "w/cardiac arrest"  . Anxiety Jan. 2012  . Blood transfusion 09/2010  . Cardiac arrest ([place]) 08/04/2010  . Carotid artery occlusion    mild to moderate bilateral internal carotid artery stenosis  . Claudication ([place])   . Complication of anesthesia    [place]  . Coronary artery disease    CABG 2011 in  [place]  . Coronary artery disease   . Dementia   . Depression   . DVT (deep venous thrombosis) ([place])   . High cholesterol   . Hypertension   . Myocardial infarction [facility]) 09/27/10   in [place]  . Obesity   . Peripheral vascular disease (HCC)    bilat SFA diseae  . Renal  artery stenosis ([place])   . Rotator cuff tendonitis   . Stroke [facility]) 2011  . Subdural hematoma [facility]) May 25,2013   Frontal subdural  S/P evacuation of hematoma  . Type II diabetes mellitus (HCC)    Diet and Exercise   Past Surgical History:  Procedure Laterality Date  . ATHERECTOMY N/A 01/17/2012   Procedure: ATHERECTOMY;  Surgeon: Lorretta Harp, MD;  Location: [facility] CATH LAB;  Service: Cardiovascular;  Laterality: N/A;  . BACK SURGERY    . CHOLECYSTECTOMY  ~ 1980's  . CORONARY ARTERY BYPASS GRAFT  09/2010   CABG X4  . CRANIOTOMY  02/25/2012   Procedure: CRANIOTOMY HEMATOMA EVACUATION SUBDURAL;  Surgeon: Eustace Moore, MD;  Location: [place] NEURO ORS;  Service: Neurosurgery;  Laterality: Left;  Left craniotomy for evacuation of subdural hematoma  . LOWER EXTREMITY ANGIOGRAM  April 2013  . OVARIAN CYST REMOVAL  1983  . POSTERIOR FUSION LUMBAR SPINE  ` 2000  . SPINE SURGERY    . stomach and skin tuck  2000     No outpatient medications have been marked as taking for the 02/19/19 encounter (Appointment) with Lorretta Harp, MD.     Allergies:   Halothane   Social History   Tobacco Use  . Smoking status: Former Smoker    Packs/day: 1.00    Years: 30.00    Pack years: 30.00    Types: Cigarettes    Last attempt to quit: 06/23/2003    Years since quitting: 15.6  . Smokeless tobacco: Never Used  Substance Use Topics  . Alcohol use: Yes    Alcohol/week: 0.0 standard drinks    Comment: 01/17/12 "used to have glass of wine now & then; last wine was ~ 2011"  . Drug use: No     Family Hx: The patient's family history includes Alcohol abuse in an other family member; Diabetes in her mother and another family member; Heart attack in her brother, father, mother, and sister; Heart disease in her brother, father, mother, sister, and another family member; Hyperlipidemia in her brother, father, and mother; Hypertension in her brother, father, mother, and another family member; Mental illness in  an other family member.  ROS:   Please see the history of present illness.     All other systems reviewed and are negative.   Prior CV studies:   The following studies were reviewed today:  None  Labs/Other Tests and Data Reviewed:    EKG:  No ECG reviewed.  Recent Labs: No results found for requested labs within last 8760 hours.   Recent Lipid Panel No results found for: CHOL, TRIG, HDL, CHOLHDL, LDLCALC, LDLDIRECT  Wt Readings from Last 3 Encounters:  05/14/18 194 lb (88 kg)  12/26/17 190 lb (86.2 kg)  03/23/17 184 lb (83.5 kg)     Objective:    Vital Signs:  There were no vitals taken for this visit.   VITAL SIGNS:  reviewed a complete physical exam was not performed today since this was a virtual telemedicine phone visit  ASSESSMENT & PLAN:    1. Coronary artery disease- history of emergent CABG  in [place] 09/27/2010 in the setting of witnessed sudden cardiac death.  She has been asymptomatic since.  Her last Myoview stress test performed 02/12/2013 was low risk and revealed inferolateral infarction without ischemia. 2. Essential hypertension- history of essential hypertension on metoprolol and losartan with blood pressure measured by the patient at home at 151/74 today. 3. Hyperlipidemia- history of hyperlipidemia with lipid profile performed 10/08/2018 revealing total cholesterol 205, LDL 114 and HDL 56 on high-dose atorvastatin.  She is not at goal for secondary prevention.  She may be a candidate for Repatha.  We will follow-up with this and have Kristen call her to discuss 4. Peripheral arterial disease- history of PAD with angiography performed by myself 01/17/2012 revealing bilaterally occluded SFAs.  I did send her to Dr. [name] for surgical evaluation, and the consensus was that she was not a candidate for surgical intervention and recommended conservative treatment.  She is on Pletal and really denies claudication.  COVID-19 Education: The signs and symptoms of  COVID-19 were discussed with the patient and how to seek care for testing (follow up with PCP or arrange E-visit).  The importance of social distancing was discussed today.  Time:   Today, I have spent 5 minutes with the patient with telehealth technology discussing the above problems.     Medication Adjustments/Labs and Tests Ordered: Current medicines are reviewed at length with the patient today.  Concerns regarding medicines are outlined above.   Tests Ordered: No orders of the defined types were placed in this encounter.   Medication Changes: No orders of the defined types were placed in this encounter.   Disposition:  Follow up in 1 year(s)  Signed, Quay Burow, MD  02/19/2019 12:40 PM    [place]

## 2019-02-19 NOTE — Progress Notes (Signed)
Alicia Crawford [id number] [birth year] 63 y.o.  Virtual Visit via Telephone Note  I connected with@ on 02/19/19 at 10:00 AM EDT by telephone and verified that I am speaking with the correct person using two identifiers.   I discussed the limitations, risks, security and privacy concerns of performing an evaluation and management service by telephone and the availability of in person appointments. I also discussed with the patient that there may be a patient responsible charge related to this service. The patient expressed understanding and agreed to proceed.   I discussed the assessment and treatment plan with the patient. The patient was provided an opportunity to ask questions and all were answered. The patient agreed with the plan and demonstrated an understanding of the instructions.   The patient was advised to call back or seek an in-person evaluation if the symptoms worsen or if the condition fails to improve as anticipated.  I provided 15 minutes of non-face-to-face time during this encounter.  The patient was located at home.  The provider was located at home.   Thayer Headings, PMHNP   Subjective:   Patient ID:  Alicia Crawford is a 63 y.o. (DOB 05/26/1956) female.  Chief Complaint:  Chief Complaint  Patient presents with  . Follow-up    h/o Depression and Anxiety    HPI Alicia Crawford presents for follow-up of depression and anxiety. She has been a pt of [employer], [place] for several years. She reports that several years ago she had severe anxiety after having an MI on a business trip to [place]. "Fine, I'm not having any problems. I don't want to change anything." She reports that she had been taking Prozac prior to MI for depression. She reports that she has had depression since 1983 after her daughter was born. Reports that she also started having panic attacks at that time and had hospitalization for these s/s. She reports that Prozac no longer seemed to be  effective after her MI and she had to try multiple medications before Sertraline. Reports taking Sertraline since 2012.  Denies depressed mood. "I feel fine." Denies anxiety. Reports that she received new cPap equipment and sleeping well now. Averages about 7-7.5 hours. She reports that she has been gaining weight during the pandemic due to "eating junk." She reports adequate energy and motivation. Denies impaired concentration. Denies SI.   Has been on disability since her MI. Has a husband, 2 step-daughters, a daughter, and a son. She lives with her husband and her dogs.   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   Review of Systems:  Review of Systems  Musculoskeletal: Negative for gait problem.       Has had pain in her left foot and PCP has referred her to ortho.   Neurological: Negative for tremors.  Psychiatric/Behavioral:       Please refer to HPI    Medications: I have reviewed the patient's current medications.  Current Outpatient Medications  Medication Sig Dispense Refill  . Ascorbic Acid (VITAMIN C) 100 MG tablet Take 100 mg by mouth daily.    [name] aspirin EC 81 MG tablet Take 81 mg by mouth daily.    [name] atorvastatin (LIPITOR) 80 MG tablet TAKE 1 TABLET BY MOUTH DAILY 90 tablet 1  . cilostazol (PLETAL) 100 MG tablet TAKE 1 TABLET BY MOUTH TWICE DAILY BEFORE MEALS 180 tablet 3  . Coenzyme Q10 (EQL COQ10) 300 MG CAPS  Take 1 capsule by mouth daily.    . Cyanocobalamin (VITAMIN B 12 PO) Take 1,000 mg by mouth.    . losartan (COZAAR) 50 MG tablet TAKE 1 TABLET BY MOUTH DAILY 90 tablet 0  . metFORMIN (GLUCOPHAGE) 1000 MG tablet Take 1,000 mg by mouth daily.   0  . metoprolol tartrate (LOPRESSOR) 25 MG tablet TAKE 1 TABLET(25 MG) BY MOUTH TWICE DAILY 60 tablet 5  . metoprolol tartrate (LOPRESSOR) 25 MG tablet TAKE 1 TABLET(25 MG) BY MOUTH TWICE DAILY 60 tablet 0  . metoprolol tartrate (LOPRESSOR) 25 MG  tablet TAKE 1 TABLET(25 MG) BY MOUTH TWICE DAILY 60 tablet 3  . omega-3 fish oil (MAXEPA) 1000 MG CAPS capsule Take 1 capsule by mouth daily. Mega Red    . pantoprazole (PROTONIX) 40 MG tablet Take 1 tablet (40 mg total) by mouth daily. 90 tablet 2  . sertraline (ZOLOFT) 100 MG tablet Take 2 tablets (200 mg total) by mouth daily. 180 tablet 2  . Multiple Vitamin (MULITIVITAMIN WITH MINERALS) TABS Take 1 tablet by mouth daily.     No current facility-administered medications for this visit.     Medication Side Effects: None  Allergies:  Allergies  Allergen Reactions  . Halothane Hives and Other (See Comments)    Gas used 1980's Reaction: affected her liver    Past Medical History:  Diagnosis Date  . Ankle fracture 09/05/2016   right ankle  . Anoxic brain injury ([place]) 08/2010   "w/cardiac arrest"  . Anxiety Jan. 2012  . Blood transfusion 09/2010  . Cardiac arrest ([place]) 08/04/2010  . Carotid artery occlusion    mild to moderate bilateral internal carotid artery stenosis  . Claudication ([place])   . Complication of anesthesia    [place]  . Coronary artery disease    CABG 2011 in [place]  . Coronary artery disease   . Dementia   . Depression   . DVT (deep venous thrombosis) ([place])   . High cholesterol   . Hypertension   . Myocardial infarction [facility]) 09/27/10   in [place]  . Obesity   . Peripheral vascular disease (HCC)    bilat SFA diseae  . Renal artery stenosis ([place])   . Rotator cuff tendonitis   . Stroke [facility]) 2011  . Subdural hematoma [facility]) May 25,2013   Frontal subdural  S/P evacuation of hematoma  . Type II diabetes mellitus (HCC)    Diet and Exercise    Family History  Problem Relation Age of Onset  . Heart disease Mother        before age 7  . Diabetes Mother   . Hyperlipidemia Mother   . Hypertension Mother   . Heart attack Mother   . Heart disease Father        Before age 74  . Hyperlipidemia Father   . Hypertension Father   . Heart  attack Father   . Heart disease Sister        Before age 72  . Heart attack Sister   . Hyperlipidemia Brother   . Hypertension Brother   . Heart attack Brother   . Heart disease Brother        before age 94  . Heart disease Other   . Diabetes Other   . Hypertension Other   . Alcohol abuse Other   . Mental illness Other     Social History   Socioeconomic History  . Marital status: Married    Spouse name: Not  on file  . Number of children: Not on file  . Years of education: Not on file  . Highest education level: Not on file  Occupational History  . Not on file  Social Needs  . Financial resource strain: Not on file  . Food insecurity:    Worry: Not on file    Inability: Not on file  . Transportation needs:    Medical: Not on file    Non-medical: Not on file  Tobacco Use  . Smoking status: Former Smoker    Packs/day: 1.00    Years: 30.00    Pack years: 30.00    Types: Cigarettes    Last attempt to quit: 06/23/2003    Years since quitting: 15.6  . Smokeless tobacco: Never Used  Substance and Sexual Activity  . Alcohol use: Yes    Alcohol/week: 0.0 standard drinks    Comment: 01/17/12 "used to have glass of wine now & then; last wine was ~ 2011"  . Drug use: No  . Sexual activity: Yes  Lifestyle  . Physical activity:    Days per week: Not on file    Minutes per session: Not on file  . Stress: Not on file  Relationships  . Social connections:    Talks on phone: Not on file    Gets together: Not on file    Attends religious service: Not on file    Active member of club or organization: Not on file    Attends meetings of clubs or organizations: Not on file    Relationship status: Not on file  . Intimate partner violence:    Fear of current or ex partner: Not on file    Emotionally abused: Not on file    Physically abused: Not on file    Forced sexual activity: Not on file  Other Topics Concern  . Not on file  Social History Narrative  . Not on file     Past Medical History, Surgical history, Social history, and Family history were reviewed and updated as appropriate.   Please see review of systems for further details on the patient's review from today.   Objective:   Physical Exam:  There were no vitals taken for this visit.  Physical Exam Neurological:     Mental Status: She is alert and oriented to person, place, and time.     Cranial Nerves: No dysarthria.  Psychiatric:        Attention and Perception: Attention normal.        Mood and Affect: Mood normal.        Speech: Speech normal.        Behavior: Behavior is cooperative.        Thought Content: Thought content normal. Thought content is not paranoid or delusional. Thought content does not include homicidal or suicidal ideation. Thought content does not include homicidal or suicidal plan.        Cognition and Memory: Cognition and memory normal.        Judgment: Judgment normal.     Lab Review:     Component Value Date/Time   NA 138 03/02/2012 0635   K 3.7 03/02/2012 0635   CL 100 03/02/2012 0635   CO2 27 03/02/2012 0635   GLUCOSE 143 (H) 03/02/2012 0635   BUN 9 03/02/2012 0635   CREATININE 0.54 03/02/2012 0635   CALCIUM 8.9 03/02/2012 0635   PROT 6.8 03/02/2012 0635   ALBUMIN 3.1 (L) 03/02/2012 0635   AST 13 03/02/2012  0635   ALT 19 03/02/2012 0635   ALKPHOS 71 03/02/2012 0635   BILITOT 0.5 03/02/2012 0635   GFRNONAA >90 03/02/2012 0635   GFRAA >90 03/02/2012 0635       Component Value Date/Time   WBC 7.2 07/06/2012 1148   RBC 4.58 07/06/2012 1148   HGB 13.6 07/06/2012 1148   HCT 40.2 07/06/2012 1148   PLT 255 07/06/2012 1148   MCV 87.8 07/06/2012 1148   MCH 29.7 07/06/2012 1148   MCHC 33.8 07/06/2012 1148   RDW 14.8 07/06/2012 1148   LYMPHSABS 2.5 03/02/2012 0635   MONOABS 0.8 03/02/2012 0635   EOSABS 0.1 03/02/2012 0635   BASOSABS 0.0 03/02/2012 0635    No results found for: POCLITH, LITHIUM   No results found for: PHENYTOIN,  PHENOBARB, VALPROATE, CBMZ   .res Assessment: Plan:   Continue sertraline 200 mg daily for depression and anxiety. Patient to follow-up in 6 months or sooner if clinically indicated. Patient advised to contact office with any questions, adverse effects, or acute worsening in signs and symptoms.  Major depressive disorder in full remission, unspecified whether recurrent ([place]) - Plan: sertraline (ZOLOFT) 100 MG tablet  Please see After Visit Summary for patient specific instructions.  Future Appointments  Date Time Provider [place]  02/20/2019 12:45 PM Hayden Pedro, MD TRE-TRE None  08/20/2019  1:00 PM Thayer Headings, PMHNP CP-CP None    No orders of the defined types were placed in this encounter.     -------------------------------

## 2019-02-19 NOTE — Telephone Encounter (Signed)
Pt had successful telemedicine appt 5/19

## 2019-02-20 ENCOUNTER — Encounter (INDEPENDENT_AMBULATORY_CARE_PROVIDER_SITE_OTHER): Payer: PPO | Admitting: Ophthalmology

## 2019-02-20 ENCOUNTER — Other Ambulatory Visit: Payer: Self-pay

## 2019-02-20 DIAGNOSIS — H43813 Vitreous degeneration, bilateral: Secondary | ICD-10-CM | POA: Diagnosis not present

## 2019-02-20 DIAGNOSIS — E113312 Type 2 diabetes mellitus with moderate nonproliferative diabetic retinopathy with macular edema, left eye: Secondary | ICD-10-CM

## 2019-02-20 DIAGNOSIS — I10 Essential (primary) hypertension: Secondary | ICD-10-CM

## 2019-02-20 DIAGNOSIS — E113391 Type 2 diabetes mellitus with moderate nonproliferative diabetic retinopathy without macular edema, right eye: Secondary | ICD-10-CM

## 2019-02-20 DIAGNOSIS — H35033 Hypertensive retinopathy, bilateral: Secondary | ICD-10-CM | POA: Diagnosis not present

## 2019-02-20 DIAGNOSIS — E11311 Type 2 diabetes mellitus with unspecified diabetic retinopathy with macular edema: Secondary | ICD-10-CM | POA: Diagnosis not present

## 2019-03-01 ENCOUNTER — Telehealth: Payer: Self-pay | Admitting: Cardiovascular Disease

## 2019-03-01 NOTE — Telephone Encounter (Signed)
New message:   Patient calling stating that the doctor told her some was going to call her concering some new medication. please call patient.

## 2019-03-02 NOTE — Telephone Encounter (Signed)
The new medication is probably Repatha. I told her that Cyril Mourning would call her to arrange. Please have Cyril Mourning call her

## 2019-03-04 DIAGNOSIS — E1169 Type 2 diabetes mellitus with other specified complication: Secondary | ICD-10-CM | POA: Diagnosis not present

## 2019-03-04 DIAGNOSIS — E11319 Type 2 diabetes mellitus with unspecified diabetic retinopathy without macular edema: Secondary | ICD-10-CM | POA: Diagnosis not present

## 2019-03-04 DIAGNOSIS — E785 Hyperlipidemia, unspecified: Secondary | ICD-10-CM | POA: Diagnosis not present

## 2019-03-04 DIAGNOSIS — Z7984 Long term (current) use of oral hypoglycemic drugs: Secondary | ICD-10-CM | POA: Diagnosis not present

## 2019-03-04 DIAGNOSIS — I739 Peripheral vascular disease, unspecified: Secondary | ICD-10-CM | POA: Diagnosis not present

## 2019-03-04 DIAGNOSIS — I10 Essential (primary) hypertension: Secondary | ICD-10-CM | POA: Diagnosis not present

## 2019-03-05 NOTE — Telephone Encounter (Signed)
Staff message sent to pharmD on 5/19

## 2019-03-05 NOTE — Telephone Encounter (Signed)
Labs in [place] - Jan 2020      TC 205, TG 171, HDL 56, LDL 114 - per chart on atrovastatin 80  LMOM to verify statin use and discuss potential PCSK-9 initiation

## 2019-03-06 NOTE — Telephone Encounter (Signed)
*  Phone follow-up*  HPI: Alicia Crawford is a 63 y.o. female patient referred to lipid clinic by Dr [name]. PMH is significant for hypertension, hyperlipidemia, stroke, CABG, CAD , diabetes, and PVD.   Patient LDL remains above 100mg /dL while on Atorvastatin 80mg  daily. Patient report compliance and understands risk of elevated LDL. She will like to start PCSK9i therapy as recommended by Dr [name].   Current Medications:  Atorvastatin 80mg  daily  Intolerances: none  LDL goal: 70mg /dL (50mg /dL prefer)   Labs: 10/08/2018: CHO 205, TG 171, HDL 56, LDL-c 114 (on atorvastatin 80mg )  Assessment & Plan  Repatha indication, administration, prior authorization process and common side effects where discussed woith patient during phone follow up.  Patient agrees with therapy. Will start prior-authorization process today and follow up as needed.    Ladajah Soltys Rodriguez-Guzman PharmD, BCPS, CPP [place] [id number] 03/06/2019 10:33 AM

## 2019-03-07 ENCOUNTER — Other Ambulatory Visit: Payer: Self-pay | Admitting: Pharmacist

## 2019-03-07 MED ORDER — EVOLOCUMAB 140 MG/ML ~~LOC~~ SOAJ: 140.0000 mg | SUBCUTANEOUS | 11 refills | Status: DC

## 2019-03-07 NOTE — Telephone Encounter (Signed)
PA approved by insurance  Rx sent to prefer pharmacy. Patient to call back if unable to afford.

## 2019-03-08 DIAGNOSIS — E1169 Type 2 diabetes mellitus with other specified complication: Secondary | ICD-10-CM | POA: Diagnosis not present

## 2019-03-08 DIAGNOSIS — E785 Hyperlipidemia, unspecified: Secondary | ICD-10-CM | POA: Diagnosis not present

## 2019-03-08 DIAGNOSIS — Z7984 Long term (current) use of oral hypoglycemic drugs: Secondary | ICD-10-CM | POA: Diagnosis not present

## 2019-03-15 ENCOUNTER — Telehealth: Payer: Self-pay | Admitting: Cardiovascular Disease

## 2019-03-15 NOTE — Telephone Encounter (Signed)
Pt c/o medication issue:  1. Name of Medication: ATORVASTATIN 80 mg   2. How are you currently taking this medication (dosage and times per day)? 1 daily  3. Are you having a reaction (difficulty breathing--STAT)?  NO   4. What is your medication issue? Pt has a question about taking REPATHA and atorvastain Together.?  Please give her a call back.

## 2019-03-15 NOTE — Telephone Encounter (Signed)
Called patien, she states that she picked up Repatha injection today- and questions if she should take both Repatha and Atorvastatin or should Repatha replaced Atorvastatin?  Please advise, thanks!

## 2019-03-15 NOTE — Telephone Encounter (Signed)
Spoke with patient, she understands to take both atorvastatin and Repatha

## 2019-03-19 ENCOUNTER — Other Ambulatory Visit: Payer: Self-pay | Admitting: Cardiovascular Disease

## 2019-03-21 DIAGNOSIS — M79672 Pain in left foot: Secondary | ICD-10-CM | POA: Diagnosis not present

## 2019-03-21 DIAGNOSIS — M545 Low back pain: Secondary | ICD-10-CM | POA: Diagnosis not present

## 2019-03-21 DIAGNOSIS — S92355A Nondisplaced fracture of fifth metatarsal bone, left foot, initial encounter for closed fracture: Secondary | ICD-10-CM | POA: Diagnosis not present

## 2019-03-22 ENCOUNTER — Other Ambulatory Visit: Payer: Self-pay

## 2019-03-22 ENCOUNTER — Telehealth: Payer: Self-pay

## 2019-03-22 ENCOUNTER — Encounter (INDEPENDENT_AMBULATORY_CARE_PROVIDER_SITE_OTHER): Payer: PPO | Admitting: Ophthalmology

## 2019-03-22 DIAGNOSIS — E113312 Type 2 diabetes mellitus with moderate nonproliferative diabetic retinopathy with macular edema, left eye: Secondary | ICD-10-CM | POA: Diagnosis not present

## 2019-03-22 DIAGNOSIS — H33301 Unspecified retinal break, right eye: Secondary | ICD-10-CM | POA: Diagnosis not present

## 2019-03-22 DIAGNOSIS — I10 Essential (primary) hypertension: Secondary | ICD-10-CM | POA: Diagnosis not present

## 2019-03-22 DIAGNOSIS — H2513 Age-related nuclear cataract, bilateral: Secondary | ICD-10-CM

## 2019-03-22 DIAGNOSIS — E11311 Type 2 diabetes mellitus with unspecified diabetic retinopathy with macular edema: Secondary | ICD-10-CM | POA: Diagnosis not present

## 2019-03-22 DIAGNOSIS — H43813 Vitreous degeneration, bilateral: Secondary | ICD-10-CM

## 2019-03-22 DIAGNOSIS — H35033 Hypertensive retinopathy, bilateral: Secondary | ICD-10-CM | POA: Diagnosis not present

## 2019-03-22 DIAGNOSIS — E113391 Type 2 diabetes mellitus with moderate nonproliferative diabetic retinopathy without macular edema, right eye: Secondary | ICD-10-CM | POA: Diagnosis not present

## 2019-03-22 NOTE — Telephone Encounter (Signed)
   [facility] Pre-operative Risk Assessment    Request for surgical clearance:  1. What type of surgery is being performed? ORIF left 5th MT fracture   2. When is this surgery scheduled? pending   3. What type of clearance is required (medical clearance vs. Pharmacy clearance to hold med vs. Both)? medical  4. Are there any medications that need to be held prior to surgery and how long? n/a   5. Practice name and name of physician performing surgery? [facility]  Dr Wylene Simmer   6. What is your office phone number? [phone]    7.   What is your office fax number? [phone]  Attn: Glendale Chard  8.   Anesthesia type (None, local, MAC, general)? general   Elverta Dimiceli, [place] 03/22/2019, 3:46 PM  _________________________________________________________________   (provider comments below)

## 2019-03-25 NOTE — Telephone Encounter (Signed)
Based on chart review pt is likely at moderate risk for surgery due to prior CAD, carotid artery disease, PAD. She does not have diabetes and renal function is normal by labs per [facility].  Message left on VM for pt to call back to update status and functional capacity for risk assessment.

## 2019-03-25 NOTE — Telephone Encounter (Signed)
F/U Message             Patient is returning Levada Dy Truitt's call, would like a call back.

## 2019-03-25 NOTE — Telephone Encounter (Signed)
Calling due to clearance protocol

## 2019-03-26 NOTE — Telephone Encounter (Signed)
   Primary Cardiologist: Quay Burow, MD  Chart reviewed as part of pre-operative protocol coverage. Patient was contacted 03/26/2019 in reference to pre-operative risk assessment for pending surgery as outlined below.  Alicia Crawford was last seen on 02/19/19 via telehealth. She has history of cardiac arrest/VDRF with emergent CABG 2011, DVT, dementia, depression, PAD, mild RAS, carotid artery disease, HTN, HLD, stroke, subdural hematoma s/p evacuation, DM. Last nuc in 2014 with Preserved LV Fuction with mild basal inferolateral hypokinesis and abnormal thickening, consistent with prior infarction, no significant change from prior. She is on ASA and cilostazol. Revised cardiac risk index is 6.6% indicating moderate risk of CV complications.  I reached out to patient to discuss how she is doing but got voicemail. LMOM to call us back. In the meantime, given patient's complex PMH and elevated baseline risk, will route to Dr. [name] for input on whether he feels any pre-emptive testing would be necessary prior to clearing, and input on holding antiplatelet agents if needed. Last EKG was 12/2017 (>1 year ago) as well. Dr. [name]. - Please route response to P CV DIV PREOP (the pre-op pool). Thank you.   Charlie Pitter, PA-C 03/26/2019, 9:55 AM

## 2019-03-26 NOTE — Telephone Encounter (Signed)
Okay to hold antiplatelet agents.  No need to do preoperative functional study.  This is a low risk operation.

## 2019-03-26 NOTE — Telephone Encounter (Signed)
Reply noted. Await callback from pt.

## 2019-03-27 NOTE — Telephone Encounter (Signed)
Patient returning call.

## 2019-03-27 NOTE — Telephone Encounter (Signed)
   Primary Cardiologist: Quay Burow, MD  Chart reviewed as part of pre-operative protocol coverage. Patient was contacted 03/27/2019 in reference to pre-operative risk assessment for pending surgery as outlined below.  Alicia Crawford was last seen on 02/19/2019 (virtual visit) by Dr. [name].  Since that day, Alicia Crawford has done well without chest pain or shortness of breath.  Therefore, based on ACC/AHA guidelines, the patient would be at acceptable risk for the planned procedure without further cardiovascular testing.   I will route this recommendation to the requesting party via Epic fax function and remove from pre-op pool.  Please call with questions. Per Dr. [name], she is a moderate risk patient proceeding with a low risk procedure. She is cleared without further workup. She may hold antiplatelet agents if needed. Note, she is on aspirin and Pletal as antiplatelet agents, both will need to be held for 7 days if requested by surgeon and restart as soon as possible after surgery. We will defer this decision whether she needs to hold these medications to the surgeon based on bleeding risk of the intended surgery.   [place], [place] 03/27/2019, 11:28 AM

## 2019-03-27 NOTE — Telephone Encounter (Signed)
Walked the surgical clearance over to [employer].

## 2019-03-28 ENCOUNTER — Other Ambulatory Visit (HOSPITAL_COMMUNITY): Payer: Self-pay | Admitting: Orthopedic Surgery

## 2019-04-08 DIAGNOSIS — M545 Low back pain: Secondary | ICD-10-CM | POA: Diagnosis not present

## 2019-04-08 DIAGNOSIS — R102 Pelvic and perineal pain: Secondary | ICD-10-CM | POA: Diagnosis not present

## 2019-04-15 DIAGNOSIS — R102 Pelvic and perineal pain: Secondary | ICD-10-CM | POA: Diagnosis not present

## 2019-04-18 DIAGNOSIS — M79672 Pain in left foot: Secondary | ICD-10-CM | POA: Diagnosis not present

## 2019-04-19 ENCOUNTER — Encounter (HOSPITAL_BASED_OUTPATIENT_CLINIC_OR_DEPARTMENT_OTHER): Payer: Self-pay | Admitting: *Deleted

## 2019-04-19 ENCOUNTER — Other Ambulatory Visit: Payer: Self-pay

## 2019-04-19 ENCOUNTER — Encounter (INDEPENDENT_AMBULATORY_CARE_PROVIDER_SITE_OTHER): Payer: PPO | Admitting: Ophthalmology

## 2019-04-19 DIAGNOSIS — E11311 Type 2 diabetes mellitus with unspecified diabetic retinopathy with macular edema: Secondary | ICD-10-CM

## 2019-04-19 DIAGNOSIS — H33301 Unspecified retinal break, right eye: Secondary | ICD-10-CM | POA: Diagnosis not present

## 2019-04-19 DIAGNOSIS — H2513 Age-related nuclear cataract, bilateral: Secondary | ICD-10-CM

## 2019-04-19 DIAGNOSIS — I10 Essential (primary) hypertension: Secondary | ICD-10-CM | POA: Diagnosis not present

## 2019-04-19 DIAGNOSIS — H43813 Vitreous degeneration, bilateral: Secondary | ICD-10-CM

## 2019-04-19 DIAGNOSIS — E113313 Type 2 diabetes mellitus with moderate nonproliferative diabetic retinopathy with macular edema, bilateral: Secondary | ICD-10-CM | POA: Diagnosis not present

## 2019-04-19 DIAGNOSIS — H35033 Hypertensive retinopathy, bilateral: Secondary | ICD-10-CM

## 2019-04-22 ENCOUNTER — Encounter (HOSPITAL_BASED_OUTPATIENT_CLINIC_OR_DEPARTMENT_OTHER)
Admission: RE | Admit: 2019-04-22 | Discharge: 2019-04-22 | Disposition: A | Discharge status: Home / Self Care | Payer: PPO | Source: Ambulatory Visit | Attending: Orthopedic Surgery | Admitting: Orthopedic Surgery

## 2019-04-22 ENCOUNTER — Other Ambulatory Visit (HOSPITAL_COMMUNITY)
Admission: RE | Admit: 2019-04-22 | Discharge: 2019-04-22 | Disposition: A | Discharge status: Home / Self Care | Payer: PPO | Source: Ambulatory Visit | Attending: Orthopedic Surgery | Admitting: Orthopedic Surgery

## 2019-04-22 DIAGNOSIS — Z1159 Encounter for screening for other viral diseases: Secondary | ICD-10-CM | POA: Insufficient documentation

## 2019-04-22 DIAGNOSIS — S92352A Displaced fracture of fifth metatarsal bone, left foot, initial encounter for closed fracture: Secondary | ICD-10-CM | POA: Insufficient documentation

## 2019-04-22 DIAGNOSIS — Z01818 Encounter for other preprocedural examination: Secondary | ICD-10-CM | POA: Insufficient documentation

## 2019-04-22 LAB — BASIC METABOLIC PANEL
Anion gap: 9 (ref 5–15)
BUN: 25 mg/dL — ABNORMAL HIGH (ref 8–23)
CO2: 25 mmol/L (ref 22–32)
Calcium: 9.3 mg/dL (ref 8.9–10.3)
Chloride: 106 mmol/L (ref 98–111)
Creatinine, Ser: 1.12 mg/dL — ABNORMAL HIGH (ref 0.44–1.00)
GFR calc Af Amer: 60 mL/min — ABNORMAL LOW (ref >60)
GFR calc non Af Amer: 52 mL/min — ABNORMAL LOW (ref >60)
Glucose, Bld: 105 mg/dL — ABNORMAL HIGH (ref 70–99)
Potassium: 4.5 mmol/L (ref 3.5–5.1)
Sodium: 140 mmol/L (ref 135–145)

## 2019-04-22 NOTE — Progress Notes (Signed)
Pt arrived for lab and EKG. Gave pt G2 with written and verbal instructions to complete drink DOS by 0600. Pt verbalized understanding

## 2019-04-23 LAB — SARS CORONAVIRUS 2 (TAT 6-24 HRS): SARS Coronavirus 2: NEGATIVE

## 2019-04-25 ENCOUNTER — Encounter (HOSPITAL_BASED_OUTPATIENT_CLINIC_OR_DEPARTMENT_OTHER)
Admission: RE | Disposition: A | Discharge status: Home / Self Care | Payer: Self-pay | Source: Home / Self Care | Attending: Orthopedic Surgery

## 2019-04-25 ENCOUNTER — Ambulatory Visit (HOSPITAL_BASED_OUTPATIENT_CLINIC_OR_DEPARTMENT_OTHER): Payer: PPO | Admitting: Certified Registered"

## 2019-04-25 ENCOUNTER — Other Ambulatory Visit: Payer: Self-pay

## 2019-04-25 ENCOUNTER — Encounter (HOSPITAL_BASED_OUTPATIENT_CLINIC_OR_DEPARTMENT_OTHER): Payer: Self-pay | Admitting: Certified Registered"

## 2019-04-25 ENCOUNTER — Ambulatory Visit (HOSPITAL_BASED_OUTPATIENT_CLINIC_OR_DEPARTMENT_OTHER)
Admission: RE | Admit: 2019-04-25 | Discharge: 2019-04-25 | Disposition: A | Discharge status: Home / Self Care | Payer: PPO | Attending: Orthopedic Surgery | Admitting: Orthopedic Surgery

## 2019-04-25 DIAGNOSIS — Z87820 Personal history of traumatic brain injury: Secondary | ICD-10-CM | POA: Insufficient documentation

## 2019-04-25 DIAGNOSIS — X58XXXD Exposure to other specified factors, subsequent encounter: Secondary | ICD-10-CM | POA: Insufficient documentation

## 2019-04-25 DIAGNOSIS — Z8674 Personal history of sudden cardiac arrest: Secondary | ICD-10-CM | POA: Diagnosis not present

## 2019-04-25 DIAGNOSIS — I701 Atherosclerosis of renal artery: Secondary | ICD-10-CM | POA: Insufficient documentation

## 2019-04-25 DIAGNOSIS — I10 Essential (primary) hypertension: Secondary | ICD-10-CM | POA: Insufficient documentation

## 2019-04-25 DIAGNOSIS — I6523 Occlusion and stenosis of bilateral carotid arteries: Secondary | ICD-10-CM | POA: Insufficient documentation

## 2019-04-25 DIAGNOSIS — Z6833 Body mass index (BMI) 33.0-33.9, adult: Secondary | ICD-10-CM | POA: Insufficient documentation

## 2019-04-25 DIAGNOSIS — F0390 Unspecified dementia without behavioral disturbance: Secondary | ICD-10-CM | POA: Diagnosis not present

## 2019-04-25 DIAGNOSIS — S92352K Displaced fracture of fifth metatarsal bone, left foot, subsequent encounter for fracture with nonunion: Secondary | ICD-10-CM | POA: Insufficient documentation

## 2019-04-25 DIAGNOSIS — Z79899 Other long term (current) drug therapy: Secondary | ICD-10-CM | POA: Insufficient documentation

## 2019-04-25 DIAGNOSIS — E669 Obesity, unspecified: Secondary | ICD-10-CM | POA: Diagnosis not present

## 2019-04-25 DIAGNOSIS — Z7982 Long term (current) use of aspirin: Secondary | ICD-10-CM | POA: Diagnosis not present

## 2019-04-25 DIAGNOSIS — F329 Major depressive disorder, single episode, unspecified: Secondary | ICD-10-CM | POA: Insufficient documentation

## 2019-04-25 DIAGNOSIS — Z951 Presence of aortocoronary bypass graft: Secondary | ICD-10-CM | POA: Diagnosis not present

## 2019-04-25 DIAGNOSIS — Z87891 Personal history of nicotine dependence: Secondary | ICD-10-CM | POA: Diagnosis not present

## 2019-04-25 DIAGNOSIS — Z86718 Personal history of other venous thrombosis and embolism: Secondary | ICD-10-CM | POA: Insufficient documentation

## 2019-04-25 DIAGNOSIS — E1151 Type 2 diabetes mellitus with diabetic peripheral angiopathy without gangrene: Secondary | ICD-10-CM | POA: Diagnosis not present

## 2019-04-25 DIAGNOSIS — F419 Anxiety disorder, unspecified: Secondary | ICD-10-CM | POA: Insufficient documentation

## 2019-04-25 DIAGNOSIS — G4730 Sleep apnea, unspecified: Secondary | ICD-10-CM | POA: Diagnosis not present

## 2019-04-25 DIAGNOSIS — I2510 Atherosclerotic heart disease of native coronary artery without angina pectoris: Secondary | ICD-10-CM | POA: Insufficient documentation

## 2019-04-25 DIAGNOSIS — M84475K Pathological fracture, left foot, subsequent encounter for fracture with nonunion: Secondary | ICD-10-CM

## 2019-04-25 DIAGNOSIS — I252 Old myocardial infarction: Secondary | ICD-10-CM | POA: Insufficient documentation

## 2019-04-25 DIAGNOSIS — Z7984 Long term (current) use of oral hypoglycemic drugs: Secondary | ICD-10-CM | POA: Insufficient documentation

## 2019-04-25 DIAGNOSIS — E7800 Pure hypercholesterolemia, unspecified: Secondary | ICD-10-CM | POA: Diagnosis not present

## 2019-04-25 DIAGNOSIS — G8918 Other acute postprocedural pain: Secondary | ICD-10-CM | POA: Diagnosis not present

## 2019-04-25 HISTORY — PX: ORIF TOE FRACTURE: SHX5032

## 2019-04-25 HISTORY — DX: Gastro-esophageal reflux disease without esophagitis: K21.9

## 2019-04-25 HISTORY — DX: Sleep apnea, unspecified: G47.30

## 2019-04-25 LAB — GLUCOSE, CAPILLARY
Glucose-Capillary: 88 mg/dL (ref 70–99)
Glucose-Capillary: 90 mg/dL (ref 70–99)

## 2019-04-25 SURGERY — OPEN REDUCTION INTERNAL FIXATION (ORIF) METATARSAL (TOE) FRACTURE
Anesthesia: General | Site: Foot | Laterality: Left

## 2019-04-25 MED ORDER — FENTANYL CITRATE (PF) 100 MCG/2ML IJ SOLN
50.0000 ug | INTRAMUSCULAR | Status: DC | PRN
  Administered 2019-04-25: 100 ug via INTRAVENOUS

## 2019-04-25 MED ORDER — PROPOFOL 10 MG/ML IV BOLUS
INTRAVENOUS | Status: DC | PRN
  Administered 2019-04-25: 120 mg via INTRAVENOUS

## 2019-04-25 MED ORDER — ONDANSETRON HCL 4 MG/2ML IJ SOLN
INTRAMUSCULAR | Status: DC | PRN
  Administered 2019-04-25: 4 mg via INTRAVENOUS

## 2019-04-25 MED ORDER — FENTANYL CITRATE (PF) 100 MCG/2ML IJ SOLN
INTRAMUSCULAR | Status: AC
  Filled 2019-04-25: qty 2

## 2019-04-25 MED ORDER — SODIUM CHLORIDE 0.9 % IV SOLN: INTRAVENOUS | Status: DC

## 2019-04-25 MED ORDER — DEXAMETHASONE SODIUM PHOSPHATE 10 MG/ML IJ SOLN
INTRAMUSCULAR | Status: DC | PRN
  Administered 2019-04-25: 4 mg via INTRAVENOUS

## 2019-04-25 MED ORDER — PROPOFOL 500 MG/50ML IV EMUL
INTRAVENOUS | Status: DC | PRN
  Administered 2019-04-25: 25 ug/kg/min via INTRAVENOUS

## 2019-04-25 MED ORDER — 0.9 % SODIUM CHLORIDE (POUR BTL) OPTIME
TOPICAL | Status: DC | PRN
  Administered 2019-04-25: 10:00:00 120 mL

## 2019-04-25 MED ORDER — MEPERIDINE HCL 25 MG/ML IJ SOLN: 6.2500 mg | INTRAMUSCULAR | Status: DC | PRN

## 2019-04-25 MED ORDER — MIDAZOLAM HCL 2 MG/2ML IJ SOLN
1.0000 mg | INTRAMUSCULAR | Status: DC | PRN
  Administered 2019-04-25: 1 mg via INTRAVENOUS

## 2019-04-25 MED ORDER — LACTATED RINGERS IV SOLN
INTRAVENOUS | Status: DC
  Administered 2019-04-25: 09:00:00 via INTRAVENOUS

## 2019-04-25 MED ORDER — FENTANYL CITRATE (PF) 100 MCG/2ML IJ SOLN: 25.0000 ug | INTRAMUSCULAR | Status: DC | PRN

## 2019-04-25 MED ORDER — TRAMADOL HCL 50 MG PO TABS: 50.0000 mg | ORAL_TABLET | Freq: Four times a day (QID) | ORAL | 0 refills | Status: AC | PRN

## 2019-04-25 MED ORDER — MIDAZOLAM HCL 2 MG/2ML IJ SOLN
INTRAMUSCULAR | Status: AC
  Filled 2019-04-25: qty 2

## 2019-04-25 MED ORDER — SCOPOLAMINE 1 MG/3DAYS TD PT72: 1.0000 | MEDICATED_PATCH | Freq: Once | TRANSDERMAL | Status: DC

## 2019-04-25 MED ORDER — CEFAZOLIN SODIUM-DEXTROSE 2-4 GM/100ML-% IV SOLN
2.0000 g | INTRAVENOUS | Status: AC
  Administered 2019-04-25: 2 g via INTRAVENOUS

## 2019-04-25 MED ORDER — LIDOCAINE HCL (CARDIAC) PF 100 MG/5ML IV SOSY
PREFILLED_SYRINGE | INTRAVENOUS | Status: DC | PRN
  Administered 2019-04-25: 30 mg via INTRAVENOUS

## 2019-04-25 MED ORDER — VANCOMYCIN HCL 500 MG IV SOLR
INTRAVENOUS | Status: DC | PRN
  Administered 2019-04-25: 500 mg via TOPICAL

## 2019-04-25 MED ORDER — CHLORHEXIDINE GLUCONATE 4 % EX LIQD: 60.0000 mL | Freq: Once | CUTANEOUS | Status: DC

## 2019-04-25 MED ORDER — METOCLOPRAMIDE HCL 5 MG/ML IJ SOLN: 10.0000 mg | Freq: Once | INTRAMUSCULAR | Status: DC | PRN

## 2019-04-25 MED ORDER — EPHEDRINE SULFATE 50 MG/ML IJ SOLN
INTRAMUSCULAR | Status: DC | PRN
  Administered 2019-04-25 (×3): 10 mg via INTRAVENOUS

## 2019-04-25 MED ORDER — ROPIVACAINE HCL 5 MG/ML IJ SOLN
INTRAMUSCULAR | Status: DC | PRN
  Administered 2019-04-25: 30 mL via PERINEURAL

## 2019-04-25 MED ORDER — CEFAZOLIN SODIUM-DEXTROSE 2-4 GM/100ML-% IV SOLN
INTRAVENOUS | Status: AC
  Filled 2019-04-25: qty 100

## 2019-04-25 SURGICAL SUPPLY — 74 items
BANDAGE ESMARK 6X9 LF (GAUZE/BANDAGES/DRESSINGS) IMPLANT
BIT DRILL CANNULATED 3.8MM (DRILL) ×1 IMPLANT
BLADE SURG 15 STRL LF DISP TIS (BLADE) ×2 IMPLANT
BLADE SURG 15 STRL SS (BLADE) ×2
BNDG COHESIVE 4X5 TAN STRL (GAUZE/BANDAGES/DRESSINGS) ×2 IMPLANT
BNDG COHESIVE 6X5 TAN STRL LF (GAUZE/BANDAGES/DRESSINGS) IMPLANT
BNDG CONFORM 2 STRL LF (GAUZE/BANDAGES/DRESSINGS) ×2 IMPLANT
BNDG ESMARK 4X9 LF (GAUZE/BANDAGES/DRESSINGS) IMPLANT
BNDG ESMARK 6X9 LF (GAUZE/BANDAGES/DRESSINGS)
BOOT STEPPER DURA SM (SOFTGOODS) ×2 IMPLANT
CANISTER SUCT 1200ML W/VALVE (MISCELLANEOUS) ×2 IMPLANT
CHLORAPREP W/TINT 26 (MISCELLANEOUS) ×2 IMPLANT
COVER BACK TABLE REUSABLE LG (DRAPES) ×2 IMPLANT
COVER WAND RF STERILE (DRAPES) IMPLANT
CUFF TOURN SGL QUICK 34 (TOURNIQUET CUFF) ×1
CUFF TRNQT CYL 34X4.125X (TOURNIQUET CUFF) ×1 IMPLANT
DECANTER SPIKE VIAL GLASS SM (MISCELLANEOUS) IMPLANT
DRAPE EXTREMITY T 121X128X90 (DISPOSABLE) ×2 IMPLANT
DRAPE HALF SHEET 70X43 (DRAPES) ×2 IMPLANT
DRAPE OEC MINIVIEW 54X84 (DRAPES) ×2 IMPLANT
DRAPE U-SHAPE 47X51 STRL (DRAPES) ×2 IMPLANT
DRILL CANNULATED 3.8MM (DRILL) ×2
DRSG MEPITEL 4X7.2 (GAUZE/BANDAGES/DRESSINGS) ×2 IMPLANT
DRSG PAD ABDOMINAL 8X10 ST (GAUZE/BANDAGES/DRESSINGS) ×4 IMPLANT
ELECT REM PT RETURN 9FT ADLT (ELECTROSURGICAL) ×2
ELECTRODE REM PT RTRN 9FT ADLT (ELECTROSURGICAL) ×1 IMPLANT
GAUZE SPONGE 4X4 12PLY STRL (GAUZE/BANDAGES/DRESSINGS) ×2 IMPLANT
GLOVE BIO SURGEON STRL SZ7 (GLOVE) ×2 IMPLANT
GLOVE BIO SURGEON STRL SZ8 (GLOVE) ×2 IMPLANT
GLOVE BIOGEL PI IND STRL 7.0 (GLOVE) ×1 IMPLANT
GLOVE BIOGEL PI IND STRL 7.5 (GLOVE) ×1 IMPLANT
GLOVE BIOGEL PI IND STRL 8 (GLOVE) ×1 IMPLANT
GLOVE BIOGEL PI INDICATOR 7.0 (GLOVE) ×1
GLOVE BIOGEL PI INDICATOR 7.5 (GLOVE) ×1
GLOVE BIOGEL PI INDICATOR 8 (GLOVE) ×1
GLOVE ECLIPSE 8.0 STRL XLNG CF (GLOVE) IMPLANT
GOWN STRL REUS W/ TWL LRG LVL3 (GOWN DISPOSABLE) IMPLANT
GOWN STRL REUS W/ TWL XL LVL3 (GOWN DISPOSABLE) ×2 IMPLANT
GOWN STRL REUS W/TWL LRG LVL3 (GOWN DISPOSABLE)
GOWN STRL REUS W/TWL XL LVL3 (GOWN DISPOSABLE) ×2
K-WIRE 9  SMOOTH .062 (WIRE) IMPLANT
NEEDLE HYPO 22GX1.5 SAFETY (NEEDLE) IMPLANT
NS IRRIG 1000ML POUR BTL (IV SOLUTION) ×2 IMPLANT
PACK BASIN DAY SURGERY FS (CUSTOM PROCEDURE TRAY) ×2 IMPLANT
PAD CAST 4YDX4 CTTN HI CHSV (CAST SUPPLIES) ×1 IMPLANT
PADDING CAST ABS 4INX4YD NS (CAST SUPPLIES)
PADDING CAST ABS COTTON 4X4 ST (CAST SUPPLIES) IMPLANT
PADDING CAST COTTON 4X4 STRL (CAST SUPPLIES) ×1
PADDING CAST COTTON 6X4 STRL (CAST SUPPLIES) IMPLANT
PENCIL BUTTON HOLSTER BLD 10FT (ELECTRODE) ×2 IMPLANT
SANITIZER HAND PURELL 535ML FO (MISCELLANEOUS) ×2 IMPLANT
SCREW SOLID 5.5X44 (Screw) ×2 IMPLANT
SLEEVE SCD COMPRESS KNEE MED (MISCELLANEOUS) ×2 IMPLANT
SPLINT FAST PLASTER 5X30 (CAST SUPPLIES)
SPLINT PLASTER CAST FAST 5X30 (CAST SUPPLIES) IMPLANT
SPONGE LAP 18X18 RF (DISPOSABLE) ×2 IMPLANT
STOCKINETTE 6  STRL (DRAPES) ×1
STOCKINETTE 6 STRL (DRAPES) ×1 IMPLANT
SUCTION FRAZIER HANDLE 10FR (MISCELLANEOUS) ×1
SUCTION TUBE FRAZIER 10FR DISP (MISCELLANEOUS) ×1 IMPLANT
SUT ETHILON 3 0 PS 1 (SUTURE) ×2 IMPLANT
SUT FIBERWIRE #2 38 T-5 BLUE (SUTURE)
SUT MNCRL AB 3-0 PS2 18 (SUTURE) ×2 IMPLANT
SUT VIC AB 0 SH 27 (SUTURE) IMPLANT
SUT VIC AB 2-0 SH 27 (SUTURE)
SUT VIC AB 2-0 SH 27XBRD (SUTURE) IMPLANT
SUTURE FIBERWR #2 38 T-5 BLUE (SUTURE) IMPLANT
SYR BULB 3OZ (MISCELLANEOUS) ×2 IMPLANT
SYR CONTROL 10ML LL (SYRINGE) IMPLANT
TAP 5.5 (TAP) ×2 IMPLANT
TOWEL GREEN STERILE FF (TOWEL DISPOSABLE) ×4 IMPLANT
TUBE CONNECTING 20X1/4 (TUBING) ×2 IMPLANT
UNDERPAD 30X30 (UNDERPADS AND DIAPERS) ×2 IMPLANT
WIRE SMOOTH NITINOL 1.6X200 (WIRE) ×2 IMPLANT

## 2019-04-25 NOTE — Anesthesia Postprocedure Evaluation (Signed)
Anesthesia Post Note  Patient: Alicia Crawford  Procedure(s) Performed: Open Reduction Internal Fixation (ORIF) Left Fifth Metatarsal Jones Fracture (Left Foot)     Patient location during evaluation: PACU Anesthesia Type: General Level of consciousness: awake and alert Pain management: pain level controlled Vital Signs Assessment: post-procedure vital signs reviewed and stable Respiratory status: spontaneous breathing, nonlabored ventilation, respiratory function stable and patient connected to nasal cannula oxygen Cardiovascular status: blood pressure returned to baseline and stable Postop Assessment: no apparent nausea or vomiting Anesthetic complications: no    Last Vitals:  Vitals:   04/25/19 1100 04/25/19 1135  BP: (!) 144/63 116/90  Pulse: 79 95  Resp: 15 16  Temp:  36.9 C  SpO2: 99% 100%    Last Pain:  Vitals:   04/25/19 1135  TempSrc:   PainSc: 0-No pain                 Montez Hageman

## 2019-04-25 NOTE — Discharge Instructions (Signed)
Alicia Simmer, MD [facility]  Please read the following information regarding your care after surgery.  Medications  You only need a prescription for the narcotic pain medicine (ex. oxycodone, Percocet, Norco).  All of the other medicines listed below are available over the counter. X Aleve 2 pills twice a day for the first 3 days after surgery. X acetominophen (Tylenol) 650 mg every 4-6 hours as you need for minor to moderate pain X tramadol as prescribed for severe pain  Narcotic pain medicine (ex. oxycodone, Percocet, Vicodin) will cause constipation.  To prevent this problem, take the following medicines while you are taking any pain medicine. ? docusate sodium (Colace) 100 mg twice a day ? senna (Senokot) 2 tablets twice a day  ? To help prevent blood clots, take a baby aspirin (81 mg) twice a day for two weeks after surgery.  You should also get up every hour while you are awake to move around.    Weight Bearing X Bear weight when you are able on your operated leg or foot on your heel in the CAM boot. ? Bear weight only on your operated foot in the post-op shoe. ? Do not bear any weight on the operated leg or foot.  Cast / Splint / Dressing X Keep your splint, cast or dressing clean and dry.  Dont put anything (coat hanger, pencil, etc) down inside of it.  If it gets damp, use a hair dryer on the cool setting to dry it.  If it gets soaked, call the office to schedule an appointment for a cast change. ? Remove your dressing 3 days after surgery and cover the incisions with dry dressings.    After your dressing, cast or splint is removed; you may shower, but do not soak or scrub the wound.  Allow the water to run over it, and then gently pat it dry.  Swelling It is normal for you to have swelling where you had surgery.  To reduce swelling and pain, keep your toes above your nose for at least 3 days after surgery.  It may be necessary to keep your foot or leg elevated for several  weeks.  If it hurts, it should be elevated.  Follow Up Call my office at [phone] when you are discharged from the hospital or surgery center to schedule an appointment to be seen two weeks after surgery.  Call my office at [phone] if you develop a fever >101.5 F, nausea, vomiting, bleeding from the surgical site or severe pain.      Regional Anesthesia Blocks  1. Numbness or the inability to move the "blocked" extremity may last from 3-48 hours after placement. The length of time depends on the medication injected and your individual response to the medication. If the numbness is not going away after 48 hours, call your surgeon.  2. The extremity that is blocked will need to be protected until the numbness is gone and the  Strength has returned. Because you cannot feel it, you will need to take extra care to avoid injury. Because it may be weak, you may have difficulty moving it or using it. You may not know what position it is in without looking at it while the block is in effect.  3. For blocks in the legs and feet, returning to weight bearing and walking needs to be done carefully. You will need to wait until the numbness is entirely gone and the strength has returned. You should be able to move your  leg and foot normally before you try and bear weight or walk. You will need someone to be with you when you first try to ensure you do not fall and possibly risk injury.  4. Bruising and tenderness at the needle site are common side effects and will resolve in a few days.  5. Persistent numbness or new problems with movement should be communicated to the surgeon or the [place] [phone] [place] [phone]).       Post Anesthesia Home Care Instructions  Activity: Get plenty of rest for the remainder of the day. A responsible individual must stay with you for 24 hours following the procedure.  For the next 24 hours, DO NOT: -Drive a  car -Paediatric nurse -Drink alcoholic beverages -Take any medication unless instructed by your physician -Make any legal decisions or sign important papers.  Meals: Start with liquid foods such as gelatin or soup. Progress to regular foods as tolerated. Avoid greasy, spicy, heavy foods. If nausea and/or vomiting occur, drink only clear liquids until the nausea and/or vomiting subsides. Call your physician if vomiting continues.  Special Instructions/Symptoms: Your throat may feel dry or sore from the anesthesia or the breathing tube placed in your throat during surgery. If this causes discomfort, gargle with warm salt water. The discomfort should disappear within 24 hours.  If you had a scopolamine patch placed behind your ear for the management of post- operative nausea and/or vomiting:  1. The medication in the patch is effective for 72 hours, after which it should be removed.  Wrap patch in a tissue and discard in the trash. Wash hands thoroughly with soap and water. 2. You may remove the patch earlier than 72 hours if you experience unpleasant side effects which may include dry mouth, dizziness or visual disturbances. 3. Avoid touching the patch. Wash your hands with soap and water after contact with the patch.

## 2019-04-25 NOTE — Progress Notes (Signed)
Assisted Dr. Marcell Barlow with left, ultrasound guided, popliteal block. Side rails up, monitors on throughout procedure. See vital signs in flow sheet. Tolerated Procedure well.

## 2019-04-25 NOTE — Anesthesia Procedure Notes (Signed)
Anesthesia Regional Block: Popliteal block   Pre-Anesthetic Checklist: ,, timeout performed, Correct Patient, Correct Site, Correct Laterality, Correct Procedure, Correct Position, site marked, Risks and benefits discussed,  Surgical consent,  Pre-op evaluation,  At surgeon's request and post-op pain management  Laterality: Right and Lower  Prep: Maximum Sterile Barrier Precautions used, chloraprep       Needles:  Injection technique: Single-shot  Needle Type: Echogenic Stimulator Needle     Needle Length: 10cm      Additional Needles:   Procedures:,,,, ultrasound used (permanent image in chart),,,,  Narrative:  Start time: 04/25/2019 8:55 AM End time: 04/25/2019 9:00 AM Injection made incrementally with aspirations every 5 mL.  Performed by: Personally  Anesthesiologist: Montez Hageman, MD  Additional Notes: Risks, benefits and alternative to block explained extensively.  Patient tolerated procedure well, without complications.

## 2019-04-25 NOTE — Transfer of Care (Signed)
Immediate Anesthesia Transfer of Care Note  Patient: Alicia Crawford  Procedure(s) Performed: Open Reduction Internal Fixation (ORIF) Left Fifth Metatarsal Jones Fracture (Left Foot)  Patient Location: PACU  Anesthesia Type:GA combined with regional for post-op pain  Level of Consciousness: sedated and patient cooperative  Airway & Oxygen Therapy: Patient Spontanous Breathing and Patient connected to nasal cannula oxygen  Post-op Assessment: Report given to RN and Post -op Vital signs reviewed and stable  Post vital signs: Reviewed and stable  Last Vitals:  Vitals Value Taken Time  BP    Temp    Pulse 77 04/25/19 1014  Resp 15 04/25/19 1014  SpO2 100 % 04/25/19 1014  Vitals shown include unvalidated device data.  Last Pain:  Vitals:   04/25/19 0824  TempSrc: Oral         Complications: No apparent anesthesia complications

## 2019-04-25 NOTE — Anesthesia Preprocedure Evaluation (Signed)
Anesthesia Evaluation  Patient identified by MRN, date of birth, ID band Patient awake    Reviewed: Allergy & Precautions, NPO status , Patient's Chart, lab work & pertinent test results  Airway Mallampati: II  TM Distance: >3 FB Neck ROM: Full    Dental no notable dental hx.    Pulmonary sleep apnea and Continuous Positive Airway Pressure Ventilation , former smoker,    Pulmonary exam normal breath sounds clear to auscultation       Cardiovascular hypertension, Pt. on medications + CAD, + Past MI, + CABG and + DVT  Normal cardiovascular exam Rhythm:Regular Rate:Normal     Neuro/Psych Anxiety Depression Dementia CVA    GI/Hepatic negative GI ROS, Neg liver ROS,   Endo/Other  negative endocrine ROSdiabetes  Renal/GU negative Renal ROS  negative genitourinary   Musculoskeletal negative musculoskeletal ROS (+)   Abdominal   Peds negative pediatric ROS (+)  Hematology negative hematology ROS (+)   Anesthesia Other Findings   Reproductive/Obstetrics negative OB ROS                             Anesthesia Physical Anesthesia Plan  ASA: III  Anesthesia Plan: General   Post-op Pain Management:  Regional for Post-op pain   Induction: Intravenous  PONV Risk Score and Plan: 3 and Ondansetron, Dexamethasone and Treatment may vary due to age or medical condition  Airway Management Planned: LMA  Additional Equipment:   Intra-op Plan:   Post-operative Plan:   Informed Consent: I have reviewed the patients History and Physical, chart, labs and discussed the procedure including the risks, benefits and alternatives for the proposed anesthesia with the patient or authorized representative who has indicated his/her understanding and acceptance.     Dental advisory given  Plan Discussed with: CRNA  Anesthesia Plan Comments:         Anesthesia Quick Evaluation

## 2019-04-25 NOTE — H&P (Signed)
Alicia Crawford is an 63 y.o. female.   Chief Complaint: Left foot pain HPI: The patient is a 63 year old female with left foot pain since an injury in March.  She has a Jones fracture that has not healed.  She hurts with activity including standing and walking.  She feels better with rest and elevation.  Past Medical History:  Diagnosis Date  . Ankle fracture 09/05/2016   right ankle  . Anoxic brain injury ([place]) 08/2010   "w/cardiac arrest"  . Anxiety Jan. 2012  . Blood transfusion 09/2010  . Cardiac arrest ([place]) 08/04/2010  . Carotid artery occlusion    mild to moderate bilateral internal carotid artery stenosis  . Claudication ([place])   . Complication of anesthesia    [place]  . Coronary artery disease    CABG 2011 in [place]  . Coronary artery disease   . Dementia   . Depression   . DVT (deep venous thrombosis) ([place])   . GERD (gastroesophageal reflux disease)   . High cholesterol   . Hypertension   . Myocardial infarction [facility]) 09/27/10   in [place]  . Obesity   . Peripheral vascular disease (HCC)    bilat SFA diseae  . Renal artery stenosis ([place])   . Rotator cuff tendonitis   . Sleep apnea    uses CPAP nightly  . Stroke [facility]) 2011  . Subdural hematoma [facility]) May 25,2013   Frontal subdural  S/P evacuation of hematoma  . Type II diabetes mellitus (HCC)    Diet and Exercise    Past Surgical History:  Procedure Laterality Date  . ATHERECTOMY N/A 01/17/2012   Procedure: ATHERECTOMY;  Surgeon: Lorretta Harp, MD;  Location: [facility] CATH LAB;  Service: Cardiovascular;  Laterality: N/A;  . BACK SURGERY    . CHOLECYSTECTOMY  ~ 1980's  . CORONARY ARTERY BYPASS GRAFT  09/2010   CABG X4  . CRANIOTOMY  02/25/2012   Procedure: CRANIOTOMY HEMATOMA EVACUATION SUBDURAL;  Surgeon: Eustace Moore, MD;  Location: [place] NEURO ORS;  Service: Neurosurgery;  Laterality: Left;  Left craniotomy for evacuation of subdural hematoma  . LOWER EXTREMITY ANGIOGRAM  April 2013  . OVARIAN  CYST REMOVAL  1983  . POSTERIOR FUSION LUMBAR SPINE  ` 2000  . SPINE SURGERY    . stomach and skin tuck  2000    Family History  Problem Relation Age of Onset  . Heart disease Mother        before age 45  . Diabetes Mother   . Hyperlipidemia Mother   . Hypertension Mother   . Heart attack Mother   . Heart disease Father        Before age 40  . Hyperlipidemia Father   . Hypertension Father   . Heart attack Father   . Heart disease Sister        Before age 40  . Heart attack Sister   . Hyperlipidemia Brother   . Hypertension Brother   . Heart attack Brother   . Heart disease Brother        before age 8  . Heart disease Other   . Diabetes Other   . Hypertension Other   . Alcohol abuse Other   . Mental illness Other    Social History:  reports that she quit smoking about 15 years ago. Her smoking use included cigarettes. She has a 30.00 pack-year smoking history. She has never used smokeless tobacco. She reports previous alcohol use. She reports that  she does not use drugs.  Allergies:  Allergies  Allergen Reactions  . Halothane Hives and Other (See Comments)    Gas used 1980's Reaction: affected her liver    Medications Prior to Admission  Medication Sig Dispense Refill  . Ascorbic Acid (VITAMIN C) 100 MG tablet Take 100 mg by mouth daily.    [name] aspirin EC 81 MG tablet Take 81 mg by mouth daily.    [name] atorvastatin (LIPITOR) 80 MG tablet TAKE 1 TABLET BY MOUTH DAILY 90 tablet 3  . cilostazol (PLETAL) 100 MG tablet TAKE 1 TABLET BY MOUTH TWICE DAILY BEFORE MEALS 180 tablet 3  . Coenzyme Q10 (EQL COQ10) 300 MG CAPS Take 1 capsule by mouth daily.    . Cyanocobalamin (VITAMIN B 12 PO) Take 1,000 mg by mouth.    . Evolocumab (REPATHA SURECLICK) 680 MG/ML SOAJ Inject 140 mg into the skin every 14 (fourteen) days. 2 pen 11  . losartan (COZAAR) 50 MG tablet TAKE 1 TABLET BY MOUTH DAILY 90 tablet 0  . metFORMIN (GLUCOPHAGE) 1000 MG tablet Take 1,000 mg by mouth daily.   0  .  metoprolol tartrate (LOPRESSOR) 25 MG tablet TAKE 1 TABLET(25 MG) BY MOUTH TWICE DAILY 60 tablet 5  . Multiple Vitamin (MULITIVITAMIN WITH MINERALS) TABS Take 1 tablet by mouth daily.    [name] omega-3 fish oil (MAXEPA) 1000 MG CAPS capsule Take 1 capsule by mouth daily. Mega Red    . sertraline (ZOLOFT) 100 MG tablet Take 2 tablets (200 mg total) by mouth daily. 180 tablet 2    Results for orders placed or performed during the hospital encounter of 05-19-19 (from the past 48 hour(s))  Glucose, capillary     Status: None   Collection Time: 05-19-2019  8:42 AM  Result Value Ref Range   Glucose-Capillary 88 70 - 99 mg/dL   No results found.  ROS no recent fever, chills, nausea, vomiting or changes in her appetite  Blood pressure (!) 114/54, pulse 72, temperature 98.3 F (36.8 C), temperature source Oral, resp. rate 18, height 5\' 4"  (1.626 m), weight 89 kg, SpO2 100 %. Physical Exam  Well-nourished well-developed woman in no apparent distress.  Alert and oriented x4.  Mood and affect are normal.  Extraocular motions are intact.  Respirations are unlabored.  Gait is antalgic to the left.  Left foot has healthy skin.  There is minimal swelling.  Tender to palpation at the fifth metatarsal laterally.  5 out of 5 strength in plantarflexion and dorsiflexion of the ankle and toes.  Assessment/Plan Left fifth metatarsal Jones fracture nonunion -to the operating room today for open treatment with internal fixation.  The risks and benefits of the alternative treatment options have been discussed in detail.  The patient wishes to proceed with surgery and specifically understands risks of bleeding, infection, nerve damage, blood clots, need for additional surgery, amputation and death.   Wylene Simmer, MD 19-May-2019, 9:10 AM

## 2019-04-25 NOTE — Op Note (Signed)
04/25/2019  10:37 AM  PATIENT:  Alicia Crawford  63 y.o. 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  Open treatment of left fifth metatarsal fracture nonunion with internal fixation 2.  AP, oblique and lateral radiographs of the left foot   SURGEON:  Wylene Simmer, MD  ASSISTANT: None  ANESTHESIA:   General, regional  EBL:  minimal   TOURNIQUET:   Total Tourniquet Time Documented: Thigh (Left) - 22 minutes Total: Thigh (Left) - 22 minutes  COMPLICATIONS:  None apparent  DISPOSITION:  Extubated, awake and stable to recovery.  INDICATION FOR PROCEDURE: The patient is a 63 year old female with a history of left foot injury back in March.  She has a 5th metatarsal base Jones fracture that has gone on to a nonunion.  She presents now for operative treatment of this painful fracture nonunion.  The risks and benefits of the alternative treatment options have been discussed in detail.  The patient wishes to proceed with surgery and specifically understands risks of bleeding, infection, nerve damage, blood clots, need for additional surgery, amputation and death.  PROCEDURE IN DETAIL:  After pre operative consent was obtained, and the correct operative site was identified, the patient was brought to the operating room and placed supine on the OR table.  Anesthesia was administered.  Pre-operative antibiotics were administered.  A surgical timeout was taken.  The left lower extremity was prepped and draped in standard sterile fashion with a tourniquet around the thigh.  The extremity was elevated and the tourniquet was inflated to 250 mmHg.  An incision was made at the base of the fifth metatarsal dorsal to the peroneus brevis tendon.  Dissection was carried down through the subcutaneous tissues.  Guidepin was inserted in the base of the fifth metatarsal in the high and  inside position.  It was advanced under fluoroscopic guidance across the nonunion site into the medullary canal.  AP and lateral radiographs confirmed appropriate alignment of the guidepin.  The guidepin was then overdrilled and tapped for a 5.5 millimeter screw.  The guidepin was removed.  A 5.5 mm partially threaded solid screw was inserted from the [employer] 28 set.  This was noted to have excellent purchase and compressed the nonunion site appropriately.  Final AP, oblique and lateral radiographs showed appropriate position of the screw and appropriate reduction of the nonunion site.  The wound was irrigated and closed with Monocryl and nylon after sprinkling Vanc powder in the wound.  Sterile dressings were applied followed by compression wrap and a cam boot..  The tourniquet was released after application of the dressings.  The patient was awakened from anesthesia and transported to the recovery room in stable condition.   FOLLOW UP PLAN: Weightbearing as tolerated on the heel in a cam boot.  Follow-up in the office in 2 weeks for suture removal.   RADIOGRAPHS: AP, oblique and lateral radiographs of the left foot are obtained intraoperatively.  These show interval reduction and fixation of the fifth metatarsal base nonunion site.  Hardware is appropriately positioned and of the appropriate length.

## 2019-04-25 NOTE — Anesthesia Procedure Notes (Signed)
Procedure Name: LMA Insertion Date/Time: 04/25/2019 9:29 AM Performed by: Signe Colt, CRNA Pre-anesthesia Checklist: Patient identified, Emergency Drugs available, Suction available and Patient being monitored Patient Re-evaluated:Patient Re-evaluated prior to induction Oxygen Delivery Method: Circle system utilized Preoxygenation: Pre-oxygenation with 100% oxygen Induction Type: IV induction Ventilation: Mask ventilation without difficulty LMA: LMA inserted LMA Size: 4.0 Number of attempts: 1 Airway Equipment and Method: Bite block Placement Confirmation: positive ETCO2 Tube secured with: Tape Dental Injury: Teeth and Oropharynx as per pre-operative assessment

## 2019-04-26 ENCOUNTER — Encounter (HOSPITAL_BASED_OUTPATIENT_CLINIC_OR_DEPARTMENT_OTHER): Payer: Self-pay | Admitting: Orthopedic Surgery

## 2019-04-29 ENCOUNTER — Other Ambulatory Visit: Payer: Self-pay | Admitting: Cardiovascular Disease

## 2019-05-03 ENCOUNTER — Other Ambulatory Visit: Payer: Self-pay | Admitting: Cardiovascular Disease

## 2019-05-06 ENCOUNTER — Telehealth: Payer: Self-pay

## 2019-05-06 NOTE — Telephone Encounter (Signed)
lmomed pt regarding the approval of repatha and instructed pt to call us back w/?'s

## 2019-05-17 ENCOUNTER — Encounter (INDEPENDENT_AMBULATORY_CARE_PROVIDER_SITE_OTHER): Payer: PPO | Admitting: Ophthalmology

## 2019-05-17 ENCOUNTER — Other Ambulatory Visit: Payer: Self-pay

## 2019-05-17 DIAGNOSIS — H35033 Hypertensive retinopathy, bilateral: Secondary | ICD-10-CM | POA: Diagnosis not present

## 2019-05-17 DIAGNOSIS — H43813 Vitreous degeneration, bilateral: Secondary | ICD-10-CM | POA: Diagnosis not present

## 2019-05-17 DIAGNOSIS — E113313 Type 2 diabetes mellitus with moderate nonproliferative diabetic retinopathy with macular edema, bilateral: Secondary | ICD-10-CM

## 2019-05-17 DIAGNOSIS — H33301 Unspecified retinal break, right eye: Secondary | ICD-10-CM

## 2019-05-17 DIAGNOSIS — E11311 Type 2 diabetes mellitus with unspecified diabetic retinopathy with macular edema: Secondary | ICD-10-CM | POA: Diagnosis not present

## 2019-05-17 DIAGNOSIS — I10 Essential (primary) hypertension: Secondary | ICD-10-CM | POA: Diagnosis not present

## 2019-05-20 ENCOUNTER — Other Ambulatory Visit: Payer: Self-pay

## 2019-05-20 ENCOUNTER — Other Ambulatory Visit: Payer: Self-pay | Admitting: Cardiovascular Disease

## 2019-05-20 ENCOUNTER — Ambulatory Visit (HOSPITAL_COMMUNITY)
Admission: RE | Admit: 2019-05-20 | Discharge: 2019-05-20 | Disposition: A | Discharge status: Home / Self Care | Payer: PPO | Source: Ambulatory Visit | Attending: Internal Medicine | Admitting: Internal Medicine

## 2019-05-20 DIAGNOSIS — I6523 Occlusion and stenosis of bilateral carotid arteries: Secondary | ICD-10-CM

## 2019-05-21 ENCOUNTER — Other Ambulatory Visit: Payer: Self-pay | Admitting: *Deleted

## 2019-05-21 DIAGNOSIS — I6523 Occlusion and stenosis of bilateral carotid arteries: Secondary | ICD-10-CM

## 2019-06-14 ENCOUNTER — Encounter (INDEPENDENT_AMBULATORY_CARE_PROVIDER_SITE_OTHER): Payer: PPO | Admitting: Ophthalmology

## 2019-06-17 ENCOUNTER — Encounter (INDEPENDENT_AMBULATORY_CARE_PROVIDER_SITE_OTHER): Payer: PPO | Admitting: Ophthalmology

## 2019-06-17 ENCOUNTER — Other Ambulatory Visit: Payer: Self-pay

## 2019-06-18 ENCOUNTER — Encounter (INDEPENDENT_AMBULATORY_CARE_PROVIDER_SITE_OTHER): Payer: PPO | Admitting: Ophthalmology

## 2019-06-18 DIAGNOSIS — E11311 Type 2 diabetes mellitus with unspecified diabetic retinopathy with macular edema: Secondary | ICD-10-CM | POA: Diagnosis not present

## 2019-06-18 DIAGNOSIS — H43813 Vitreous degeneration, bilateral: Secondary | ICD-10-CM | POA: Diagnosis not present

## 2019-06-18 DIAGNOSIS — H33301 Unspecified retinal break, right eye: Secondary | ICD-10-CM

## 2019-06-18 DIAGNOSIS — H35033 Hypertensive retinopathy, bilateral: Secondary | ICD-10-CM

## 2019-06-18 DIAGNOSIS — I10 Essential (primary) hypertension: Secondary | ICD-10-CM

## 2019-06-18 DIAGNOSIS — E113313 Type 2 diabetes mellitus with moderate nonproliferative diabetic retinopathy with macular edema, bilateral: Secondary | ICD-10-CM | POA: Diagnosis not present

## 2019-06-26 DIAGNOSIS — M79672 Pain in left foot: Secondary | ICD-10-CM | POA: Diagnosis not present

## 2019-07-15 ENCOUNTER — Other Ambulatory Visit: Payer: Self-pay

## 2019-07-15 ENCOUNTER — Encounter (INDEPENDENT_AMBULATORY_CARE_PROVIDER_SITE_OTHER): Payer: PPO | Admitting: Ophthalmology

## 2019-07-15 DIAGNOSIS — H35033 Hypertensive retinopathy, bilateral: Secondary | ICD-10-CM

## 2019-07-15 DIAGNOSIS — E113313 Type 2 diabetes mellitus with moderate nonproliferative diabetic retinopathy with macular edema, bilateral: Secondary | ICD-10-CM

## 2019-07-15 DIAGNOSIS — I10 Essential (primary) hypertension: Secondary | ICD-10-CM | POA: Diagnosis not present

## 2019-07-15 DIAGNOSIS — H43813 Vitreous degeneration, bilateral: Secondary | ICD-10-CM | POA: Diagnosis not present

## 2019-07-15 DIAGNOSIS — E11311 Type 2 diabetes mellitus with unspecified diabetic retinopathy with macular edema: Secondary | ICD-10-CM

## 2019-07-18 DIAGNOSIS — E1169 Type 2 diabetes mellitus with other specified complication: Secondary | ICD-10-CM | POA: Diagnosis not present

## 2019-07-18 DIAGNOSIS — E785 Hyperlipidemia, unspecified: Secondary | ICD-10-CM | POA: Diagnosis not present

## 2019-07-18 DIAGNOSIS — I10 Essential (primary) hypertension: Secondary | ICD-10-CM | POA: Diagnosis not present

## 2019-07-18 DIAGNOSIS — Z23 Encounter for immunization: Secondary | ICD-10-CM | POA: Diagnosis not present

## 2019-07-18 DIAGNOSIS — I739 Peripheral vascular disease, unspecified: Secondary | ICD-10-CM | POA: Diagnosis not present

## 2019-07-18 DIAGNOSIS — M7918 Myalgia, other site: Secondary | ICD-10-CM | POA: Diagnosis not present

## 2019-07-23 ENCOUNTER — Other Ambulatory Visit: Payer: Self-pay

## 2019-07-23 ENCOUNTER — Encounter: Payer: Self-pay | Admitting: Physical Therapy

## 2019-07-23 ENCOUNTER — Ambulatory Visit: Payer: PPO | Attending: Family Medicine | Admitting: Physical Therapy

## 2019-07-23 DIAGNOSIS — M25652 Stiffness of left hip, not elsewhere classified: Secondary | ICD-10-CM | POA: Diagnosis not present

## 2019-07-23 DIAGNOSIS — M6281 Muscle weakness (generalized): Secondary | ICD-10-CM

## 2019-07-23 DIAGNOSIS — M25552 Pain in left hip: Secondary | ICD-10-CM | POA: Diagnosis not present

## 2019-07-23 NOTE — Patient Instructions (Signed)
Access Code: [email or web address]  URL: [email or web address]  Date: 07/23/2019  Prepared by: Ruben Im   Exercises  Supine Single Knee to Chest - 3 reps - 1 sets - 30 hold - 1x daily - 7x weekly  Clamshell - 10 reps - 1 sets - 1x daily - 7x weekly  Sit to Stand - 10 reps - 1 sets - 1x daily - 7x weekly

## 2019-07-23 NOTE — Therapy (Signed)
[facility] [address], [place], [place], [address] Phone: [phone]   Fax:  [phone]  Physical Therapy Evaluation  Patient Details  Name: Alicia Crawford MRN: [id number] Date of Birth: 03-09-1956 Referring Provider (PT): Dr. London Pepper   Encounter Date: 07/23/2019  PT End of Session - 07/23/19 1502    Visit Number  1    Date for PT Re-Evaluation  09/17/19    Authorization Type  [employer]    PT Start Time  1407    PT Stop Time  1447    PT Time Calculation (min)  40 min    Activity Tolerance  Patient tolerated treatment well       Past Medical History:  Diagnosis Date  . Ankle fracture 09/05/2016   right ankle  . Anoxic brain injury ([place]) 08/2010   "w/cardiac arrest"  . Anxiety Jan. 2012  . Blood transfusion 09/2010  . Cardiac arrest ([place]) 08/04/2010  . Carotid artery occlusion    mild to moderate bilateral internal carotid artery stenosis  . Claudication ([place])   . Complication of anesthesia    [place]  . Coronary artery disease    CABG 2011 in [place]  . Coronary artery disease   . Dementia   . Depression   . DVT (deep venous thrombosis) ([place])   . GERD (gastroesophageal reflux disease)   . High cholesterol   . Hypertension   . Myocardial infarction [facility]) 09/27/10   in [place]  . Obesity   . Peripheral vascular disease (HCC)    bilat SFA diseae  . Renal artery stenosis ([place])   . Rotator cuff tendonitis   . Sleep apnea    uses CPAP nightly  . Stroke [facility]) 2011  . Subdural hematoma [facility]) May 25,2013   Frontal subdural  S/P evacuation of hematoma  . Type II diabetes mellitus (HCC)    Diet and Exercise    Past Surgical History:  Procedure Laterality Date  . ATHERECTOMY N/A 01/17/2012   Procedure: ATHERECTOMY;  Surgeon: Lorretta Harp, MD;  Location: [facility] CATH LAB;  Service: Cardiovascular;  Laterality: N/A;  . BACK SURGERY    . CHOLECYSTECTOMY  ~ 1980's  . CORONARY  ARTERY BYPASS GRAFT  09/2010   CABG X4  . CRANIOTOMY  02/25/2012   Procedure: CRANIOTOMY HEMATOMA EVACUATION SUBDURAL;  Surgeon: Eustace Moore, MD;  Location: [place] NEURO ORS;  Service: Neurosurgery;  Laterality: Left;  Left craniotomy for evacuation of subdural hematoma  . LOWER EXTREMITY ANGIOGRAM  April 2013  . ORIF TOE FRACTURE Left 04/25/2019   Procedure: Open Reduction Internal Fixation (ORIF) Left Fifth Metatarsal Ronnald Ramp Fracture;  Surgeon: Wylene Simmer, MD;  Location: [place];  Service: Orthopedics;  Laterality: Left;  . OVARIAN CYST REMOVAL  1983  . POSTERIOR FUSION LUMBAR SPINE  ` 2000  . SPINE SURGERY    . stomach and skin tuck  2000    There were no vitals filed for this visit.   Subjective Assessment - 07/23/19 1409    Subjective  1 year ago moved some furniture and cracked a bone in foot.  Had a pin put in 5 months later.  Walked funny.  Went to doctor in May or June regarding hip pain.  Very low buttock pain/proximal HS.    Pertinent History  foot surgery 8 weeks ago;  "[name]";  lumbar fusion    How long can you sit comfortably?  15 minutes (trying to go to  church but it's painful)    Diagnostic tests  x-rays    Patient Stated Goals  get to the bottom of what is wrong with it and get on with my life    Currently in Pain?  Yes    Pain Score  5     Pain Location  Buttocks    Pain Orientation  Left    Pain Type  Chronic pain    Pain Radiating Towards  back of thigh    Aggravating Factors   Sitting    Pain Relieving Factors  not too bad walking or standing         [facility] PT Assessment - 07/23/19 0001      Assessment   Medical Diagnosis  left buttock pain     Referring Provider (PT)  Dr. London Pepper    Onset Date/Surgical Date  --   > 6 months   Next MD Visit  as needed     Prior Therapy  2012 had a heart attack, then a stroke       Precautions   Precautions  None      Restrictions   Weight Bearing Restrictions  No      Balance Screen   Has  the patient fallen in the past 6 months  No   > 1 year fell feeding chickens brain bleed   Has the patient had a decrease in activity level because of a fear of falling?   No    Is the patient reluctant to leave their home because of a fear of falling?   No      Home Environment   Living Environment  Private residence    Living Arrangements  Spouse/significant other    Type of [place] to enter    Home Layout  Two level;Able to live on main level with bedroom/bathroom      Prior Function   Level of Independence  Independent with basic ADLs    Leisure  walk dogs;  used to go to gym and swim 4 days a week pre-Covid       Observation/Other Assessments   Focus on Therapeutic Outcomes (FOTO)   42% limitation       AROM   Right Hip Flexion  125    Right Hip External Rotation   40    Right Hip Internal Rotation   15    Left Hip Flexion  115    Left Hip External Rotation   30    Left Hip Internal Rotation   15      Strength   Overall Strength Comments  Able to rise from chair without UE use;  HS 4+/5     Right Hip Flexion  4-/5    Right Hip Extension  4/5    Right Hip ABduction  4/5    Left Hip Flexion  4+/5    Left Hip Extension  4-/5    Left Hip ABduction  4/5    Lumbar Flexion  4/5    Lumbar Extension  4/5      Flexibility   Soft Tissue Assessment /Muscle Length  yes    Hamstrings  85 degrees bil       Palpation   Palpation comment  tender point around ischial tuberosity/proximal HS       Ambulation/Gait   Gait Comments  pre-existing foot drop  Objective measurements completed on examination: See above findings.              PT Education - 07/23/19 1518    Education Details  Access Code: [email or web address]  Sit to stand; clams, SKTC   Person(s) Educated  Patient    Methods  Explanation;Demonstration;Handout    Comprehension  Returned demonstration;Verbalized understanding       PT Short Term Goals - 07/23/19  1518      PT SHORT TERM GOAL #1   Title  The patient will demonstrate understanding of basic self care strategies including sitting limitation, using a soft cushion, basic HEP    Time  4    Period  Weeks    Status  New    Target Date  08/20/19      PT SHORT TERM GOAL #2   Title  The patient will report a 30% improvement in left buttock pain with sitting at church    Time  4    Period  Weeks    Status  New      PT SHORT TERM GOAL #3   Title  The patient will have improved left hip flexion to 125 degrees and external rotation to 40 degrees needed for improved mobility with ADLs    Time  4    Period  Weeks    Status  New        PT Long Term Goals - 07/23/19 1522      PT LONG TERM GOAL #1   Title  The patient will be independent with safe self progression of HEP    Time  8    Period  Weeks    Status  New    Target Date  09/17/19      PT LONG TERM GOAL #2   Title  The patient will report a 60% reduction in left buttock pain with sitting at home and at church    Time  8    Period  Weeks    Status  New      PT LONG TERM GOAL #3   Title  The patient will have improved trunk and left hip extension and abduction strength to grossly 4+/5 needed for improved sitting,  standing, walking and stair climbing tolerance    Time  8    Period  Weeks    Status  New      PT LONG TERM GOAL #4   Title  The patient will have improved FOTO functional outcome score to 37% indicating improved function with less pain    Time  8    Period  Weeks    Status  New             Plan - 07/23/19 1503    Clinical Impression Statement  The patient is a pleasant 63 year old with complaint of left lower buttock pain near ischial tuberosity which became exacerbated while dealing with her foot injury and surgery.   The pain is mostly present with sitting and will increase to a moderate/severe level if sitting > 15 minutes.  She has quite a bit of difficulty sitting for church.  Not as much pain with  walking and standing.   Minor movement loss with left hip flexion and external rotation ROM.  Decreased left hip extension and abduction strength 4/5.  Decreased trunk flexor and extensor strength 4/5.  Tenderness around left ischial tuberosity and proximal HS attachment.  Right hip flexor weakness and foot drop from  pre-existing conditions per patient report.  She would benefit from PT to address these deficits.    Personal Factors and Comorbidities  Age;Past/Current Experience;Comorbidity 1;Comorbidity 2;Comorbidity 3+;Time since onset of injury/illness/exacerbation    Comorbidities  MI, CVA, right drop foot, diabetes, subdural hematoma, HTN    Examination-Activity Limitations  Sit;Stand;Stairs    Examination-Participation Restrictions  Psychologist, sport and exercise;Other    Stability/Clinical Decision Making  Evolving/Moderate complexity    Clinical Decision Making  Moderate    Rehab Potential  Good    PT Frequency  2x / week    PT Duration  8 weeks    PT Treatment/Interventions  ADLs/Self Care Home Management;Electrical Stimulation;Moist Heat;Patient/family education;Manual techniques;Dry needling;Taping;Therapeutic exercise;Therapeutic activities;Neuromuscular re-education;Functional mobility training;Iontophoresis 4mg /ml Dexamethasone    PT Next Visit Plan  patient considering DN to HS;  see if she found a cushion to unload ischial tuberosity;  review initial HEP and add hip abduction and hip extension strengthening;  ES/heat as needed    PT Home Exercise Plan  Access Code: [email or web address]    Consulted and Agree with Plan of Care  Patient       Patient will benefit from skilled therapeutic intervention in order to improve the following deficits and impairments:  Pain, Decreased range of motion, Increased fascial restricitons, Impaired perceived functional ability, Decreased strength  Visit Diagnosis: Pain in left hip - Plan: PT plan of care cert/re-cert  Stiffness of left hip, not elsewhere  classified - Plan: PT plan of care cert/re-cert  Muscle weakness (generalized) - Plan: PT plan of care cert/re-cert     Problem List Patient Active Problem List   Diagnosis Date Noted  . MDD (major depressive disorder) 10/15/2018  . Peripheral vascular disease, unspecified ([place]) 03/14/2014  . Aftercare following surgery of the circulatory system, [place] 03/14/2014  . Occlusion and stenosis of carotid artery without mention of cerebral infarction 02/11/2013  . SDH (subdural hematoma) ([place]) 03/02/2012  . S/P craniotomy, 02/25/12 after fall, SDH 02/27/2012  . Bradycardia 02/27/2012  . Obesity 02/27/2012  . Diabetes mellitus 02/27/2012  . CAD, cardiac arrest, CPR, CABG in [facility] Dec 2011-NL LVF by Echo, neg Nuc April 2013 02/27/2012  . Anxiety, ? residual hypoxic brain inhury from cardiac arrest 2011 02/27/2012  . Peripheral artery disease, bilta LE PVD by angio April 2013 01/18/2012  . HLD (hyperlipidemia) 01/18/2012  . HTN (hypertension) 01/18/2012  . Renal artery stenosis, Right, 75% 01/18/2012   Ruben Im, PT 07/23/19 4:41 PM Phone: [phone] Fax: [phone] Alvera Singh 07/23/2019, 4:40 PM   [facility] [address] [place], [place], [address] Phone: [phone]   Fax:  [phone]  Name: Alicia Crawford MRN: [id number] Date of Birth: Apr 15, 1956

## 2019-07-25 ENCOUNTER — Other Ambulatory Visit: Payer: Self-pay

## 2019-07-25 ENCOUNTER — Encounter: Payer: Self-pay | Admitting: Physical Therapy

## 2019-07-25 ENCOUNTER — Ambulatory Visit: Payer: PPO | Admitting: Physical Therapy

## 2019-07-25 DIAGNOSIS — M25552 Pain in left hip: Secondary | ICD-10-CM

## 2019-07-25 DIAGNOSIS — M25652 Stiffness of left hip, not elsewhere classified: Secondary | ICD-10-CM

## 2019-07-25 DIAGNOSIS — M6281 Muscle weakness (generalized): Secondary | ICD-10-CM

## 2019-07-25 NOTE — Patient Instructions (Signed)
Access Code: [email or web address]  URL: [email or web address]  Date: 07/25/2019  Prepared by: Ruben Im   Exercises  Supine Single Knee to Chest - 3 reps - 1 sets - 30 hold - 1x daily - 7x weekly  Clamshell - 10 reps - 1 sets - 1x daily - 7x weekly  Sit to Stand - 10 reps - 1 sets - 1x daily - 7x weekly  Hooklying Hamstring Stretch with Strap - 3 reps - 1 sets - 30 hold - 1x daily - 7x weekly  Bridge - 10 reps - 1 sets - 1x daily - 7x weekly  Hooklying Clamshell with Resistance - 10 reps - 1 sets - 1x daily - 7x weekly    OPTP Stretch Out Strap    Trigger Point Dry Needling  . What is Trigger Point Dry Needling (DN)? o DN is a physical therapy technique used to treat muscle pain and dysfunction. Specifically, DN helps deactivate muscle trigger points (muscle knots).  o A thin filiform needle is used to penetrate the skin and stimulate the underlying trigger point. The goal is for a local twitch response (LTR) to occur and for the trigger point to relax. No medication of any kind is injected during the procedure.   . What Does Trigger Point Dry Needling Feel Like?  o The procedure feels different for each individual patient. Some patients report that they do not actually feel the needle enter the skin and overall the process is not painful. Very mild bleeding may occur. However, many patients feel a deep cramping in the muscle in which the needle was inserted. This is the local twitch response.   [name] How Will I feel after the treatment? o Soreness is normal, and the onset of soreness may not occur for a few hours. Typically this soreness does not last longer than two days.  o Bruising is uncommon, however; ice can be used to decrease any possible bruising.  o In rare cases feeling tired or nauseous after the treatment is normal. In addition, your symptoms may get worse before they get better, this period will typically not last longer than 24 hours.   . What Can I do After My  Treatment? o Increase your hydration by drinking more water for the next 24 hours. o You may place ice or heat on the areas treated that have become sore, however, do not use heat on inflamed or bruised areas. Heat often brings more relief post needling. o You can continue your regular activities, but vigorous activity is not recommended initially after the treatment for 24 hours. o DN is best combined with other physical therapy such as strengthening, stretching, and other therapies.     Ruben Im PT [facility] [address], [place], [place] [address] Phone # [phone] Fax [phone]

## 2019-07-25 NOTE — Therapy (Signed)
[facility] [address] [place], [place], [address] Phone: [phone]   Fax:  [phone]  Physical Therapy Treatment  Patient Details  Name: Alicia Crawford MRN: [id number] Date of Birth: 1956-07-10 Referring Provider (PT): Dr. London Pepper   Encounter Date: 07/25/2019  PT End of Session - 07/25/19 1721    Visit Number  2    Date for PT Re-Evaluation  09/17/19    Authorization Type  [employer]    PT Start Time  1450    PT Stop Time  1531    PT Time Calculation (min)  41 min    Activity Tolerance  Patient tolerated treatment well       Past Medical History:  Diagnosis Date  . Ankle fracture 09/05/2016   right ankle  . Anoxic brain injury ([place]) 08/2010   "w/cardiac arrest"  . Anxiety Jan. 2012  . Blood transfusion 09/2010  . Cardiac arrest ([place]) 08/04/2010  . Carotid artery occlusion    mild to moderate bilateral internal carotid artery stenosis  . Claudication ([place])   . Complication of anesthesia    [place]  . Coronary artery disease    CABG 2011 in [place]  . Coronary artery disease   . Dementia   . Depression   . DVT (deep venous thrombosis) ([place])   . GERD (gastroesophageal reflux disease)   . High cholesterol   . Hypertension   . Myocardial infarction [facility]) 09/27/10   in [place]  . Obesity   . Peripheral vascular disease (HCC)    bilat SFA diseae  . Renal artery stenosis ([place])   . Rotator cuff tendonitis   . Sleep apnea    uses CPAP nightly  . Stroke [facility]) 2011  . Subdural hematoma [facility]) May 25,2013   Frontal subdural  S/P evacuation of hematoma  . Type II diabetes mellitus (HCC)    Diet and Exercise    Past Surgical History:  Procedure Laterality Date  . ATHERECTOMY N/A 01/17/2012   Procedure: ATHERECTOMY;  Surgeon: Lorretta Harp, MD;  Location: [facility] CATH LAB;  Service: Cardiovascular;  Laterality: N/A;  . BACK SURGERY    . CHOLECYSTECTOMY  ~ 1980's  . CORONARY  ARTERY BYPASS GRAFT  09/2010   CABG X4  . CRANIOTOMY  02/25/2012   Procedure: CRANIOTOMY HEMATOMA EVACUATION SUBDURAL;  Surgeon: Eustace Moore, MD;  Location: [place] NEURO ORS;  Service: Neurosurgery;  Laterality: Left;  Left craniotomy for evacuation of subdural hematoma  . LOWER EXTREMITY ANGIOGRAM  April 2013  . ORIF TOE FRACTURE Left 04/25/2019   Procedure: Open Reduction Internal Fixation (ORIF) Left Fifth Metatarsal Ronnald Ramp Fracture;  Surgeon: Wylene Simmer, MD;  Location: [place];  Service: Orthopedics;  Laterality: Left;  . OVARIAN CYST REMOVAL  1983  . POSTERIOR FUSION LUMBAR SPINE  ` 2000  . SPINE SURGERY    . stomach and skin tuck  2000    There were no vitals filed for this visit.  Subjective Assessment - 07/25/19 1451    Subjective  No changes.  Sitting on a towel but looking for something better.    Currently in Pain?  Yes    Pain Score  5     Pain Orientation  Left    Pain Type  Chronic pain                       [facility] Adult PT Treatment/Exercise - 07/25/19 0001  Knee/Hip Exercises: Stretches   Active Hamstring Stretch  Left;3 reps;30 seconds    Active Hamstring Stretch Limitations  with strap and bias      Knee/Hip Exercises: Supine   Bridges Limitations  10x with green band around thighs    Other Supine Knee/Hip Exercises  abdominal brace 10x    Other Supine Knee/Hip Exercises  clams green band 10x single and double       Knee/Hip Exercises: Sidelying   Clams  green band around thighs 10x left only       Moist Heat Therapy   Number Minutes Moist Heat  12 Minutes    Moist Heat Location  Hip      Electrical Stimulation   Electrical Stimulation Location  left buttock    Electrical Stimulation Action  IFC    Electrical Stimulation Parameters  10 ma 12 min in sidelying     Electrical Stimulation Goals  Pain      Manual Therapy   Soft tissue mobilization  piriformis, prox left HS       Trigger Point Dry Needling - 07/25/19  0001    Consent Given?  Yes    Education Handout Provided  Yes    Dry Needling Comments  left    Hamstring Response  Palpable increased muscle length    Piriformis Response  Palpable increased muscle length           PT Education - 07/25/19 1720    Education Details  Access Code: [email or web address]   bridge, supine clams green band;  supine HS strap;  dry needling after care    Person(s) Educated  Patient    Methods  Explanation;Demonstration;Handout    Comprehension  Returned demonstration;Verbalized understanding       PT Short Term Goals - 07/23/19 1518      PT SHORT TERM GOAL #1   Title  The patient will demonstrate understanding of basic self care strategies including sitting limitation, using a soft cushion, basic HEP    Time  4    Period  Weeks    Status  New    Target Date  08/20/19      PT SHORT TERM GOAL #2   Title  The patient will report a 30% improvement in left buttock pain with sitting at church    Time  4    Period  Weeks    Status  New      PT SHORT TERM GOAL #3   Title  The patient will have improved left hip flexion to 125 degrees and external rotation to 40 degrees needed for improved mobility with ADLs    Time  4    Period  Weeks    Status  New        PT Long Term Goals - 07/23/19 1522      PT LONG TERM GOAL #1   Title  The patient will be independent with safe self progression of HEP    Time  8    Period  Weeks    Status  New    Target Date  09/17/19      PT LONG TERM GOAL #2   Title  The patient will report a 60% reduction in left buttock pain with sitting at home and at church    Time  8    Period  Weeks    Status  New      PT LONG TERM GOAL #3   Title  The patient will have  improved trunk and left hip extension and abduction strength to grossly 4+/5 needed for improved sitting,  standing, walking and stair climbing tolerance    Time  8    Period  Weeks    Status  New      PT LONG TERM GOAL #4   Title  The patient will have improved  FOTO functional outcome score to 37% indicating improved function with less pain    Time  8    Period  Weeks    Status  New            Plan - 07/25/19 1525    Clinical Impression Statement  The patient reports she "will be sore tomorrow" b/c she is not used to exercising her leg muscles.  She denies pain during the ex, just muscular fatigue with repetition.  Tender points identified around ischial tuberosity and the patient is receptive to manual therapy and dry needling to proximal HS and piriformis.  She has a positive response to ES/heat.  Therapist closely monitoring response to all interventions and providing cues to activate appropriate muscles during ex without compensation.    Comorbidities  MI, CVA, right drop foot, diabetes, subdural hematoma, HTN    Examination-Participation Restrictions  Psychologist, sport and exercise;Other    Rehab Potential  Good    PT Frequency  2x / week    PT Duration  8 weeks    PT Treatment/Interventions  ADLs/Self Care Home Management;Electrical Stimulation;Moist Heat;Patient/family education;Manual techniques;Dry needling;Taping;Therapeutic exercise;Therapeutic activities;Neuromuscular re-education;Functional mobility training;Iontophoresis 4mg /ml Dexamethasone    PT Next Visit Plan  assess response to DN #1;  see if she found a cushion for sitting to unload ischial tuberosity;  review HEP and progress as able;  avoid sitting;  ES/heat as needed    PT Home Exercise Plan  Access Code: [email or web address]       Patient will benefit from skilled therapeutic intervention in order to improve the following deficits and impairments:  Pain, Decreased range of motion, Increased fascial restricitons, Impaired perceived functional ability, Decreased strength  Visit Diagnosis: Pain in left hip  Stiffness of left hip, not elsewhere classified  Muscle weakness (generalized)     Problem List Patient Active Problem List   Diagnosis Date Noted  . MDD (major depressive  disorder) 10/15/2018  . Peripheral vascular disease, unspecified ([place]) 03/14/2014  . Aftercare following surgery of the circulatory system, [place] 03/14/2014  . Occlusion and stenosis of carotid artery without mention of cerebral infarction 02/11/2013  . SDH (subdural hematoma) ([place]) 03/02/2012  . S/P craniotomy, 02/25/12 after fall, SDH 02/27/2012  . Bradycardia 02/27/2012  . Obesity 02/27/2012  . Diabetes mellitus 02/27/2012  . CAD, cardiac arrest, CPR, CABG in [facility] Dec 2011-NL LVF by Echo, neg Nuc April 2013 02/27/2012  . Anxiety, ? residual hypoxic brain inhury from cardiac arrest 2011 02/27/2012  . Peripheral artery disease, bilta LE PVD by angio April 2013 01/18/2012  . HLD (hyperlipidemia) 01/18/2012  . HTN (hypertension) 01/18/2012  . Renal artery stenosis, Right, 75% 01/18/2012   Ruben Im, PT 07/25/19 5:28 PM Phone: [phone] Fax: [phone] Alvera Singh 07/25/2019, 5:27 PM  [facility] [address] [place], [place], [address] Phone: [phone]   Fax:  [phone]  Name: Alicia Crawford MRN: [id number] Date of Birth: May 25, 1956

## 2019-07-31 ENCOUNTER — Encounter: Payer: Self-pay | Admitting: Physical Therapy

## 2019-07-31 ENCOUNTER — Other Ambulatory Visit: Payer: Self-pay

## 2019-07-31 ENCOUNTER — Ambulatory Visit: Payer: PPO | Admitting: Physical Therapy

## 2019-07-31 DIAGNOSIS — M6281 Muscle weakness (generalized): Secondary | ICD-10-CM

## 2019-07-31 DIAGNOSIS — M25552 Pain in left hip: Secondary | ICD-10-CM | POA: Diagnosis not present

## 2019-07-31 DIAGNOSIS — M25652 Stiffness of left hip, not elsewhere classified: Secondary | ICD-10-CM

## 2019-07-31 NOTE — Therapy (Signed)
[facility] [address], [place], [place], [address] Phone: [phone]   Fax:  [phone]  Physical Therapy Treatment  Patient Details  Name: Alicia Crawford MRN: [id number] Date of Birth: 08/16/1956 Referring Provider (PT): Dr. London Pepper   Encounter Date: 07/31/2019  PT End of Session - 07/31/19 1534    Visit Number  3    Date for PT Re-Evaluation  09/17/19    Authorization Type  [employer]    PT Start Time  1533    PT Stop Time  1611    PT Time Calculation (min)  38 min    Activity Tolerance  Patient tolerated treatment well    Behavior During Therapy  [facility] for tasks assessed/performed       Past Medical History:  Diagnosis Date  . Ankle fracture 09/05/2016   right ankle  . Anoxic brain injury ([place]) 08/2010   "w/cardiac arrest"  . Anxiety Jan. 2012  . Blood transfusion 09/2010  . Cardiac arrest ([place]) 08/04/2010  . Carotid artery occlusion    mild to moderate bilateral internal carotid artery stenosis  . Claudication ([place])   . Complication of anesthesia    [place]  . Coronary artery disease    CABG 2011 in [place]  . Coronary artery disease   . Dementia   . Depression   . DVT (deep venous thrombosis) ([place])   . GERD (gastroesophageal reflux disease)   . High cholesterol   . Hypertension   . Myocardial infarction [facility]) 09/27/10   in [place]  . Obesity   . Peripheral vascular disease (HCC)    bilat SFA diseae  . Renal artery stenosis ([place])   . Rotator cuff tendonitis   . Sleep apnea    uses CPAP nightly  . Stroke [facility]) 2011  . Subdural hematoma [facility]) May 25,2013   Frontal subdural  S/P evacuation of hematoma  . Type II diabetes mellitus (HCC)    Diet and Exercise    Past Surgical History:  Procedure Laterality Date  . ATHERECTOMY N/A 01/17/2012   Procedure: ATHERECTOMY;  Surgeon: Lorretta Harp, MD;  Location: [facility] CATH LAB;  Service: Cardiovascular;  Laterality: N/A;  .  BACK SURGERY    . CHOLECYSTECTOMY  ~ 1980's  . CORONARY ARTERY BYPASS GRAFT  09/2010   CABG X4  . CRANIOTOMY  02/25/2012   Procedure: CRANIOTOMY HEMATOMA EVACUATION SUBDURAL;  Surgeon: Eustace Moore, MD;  Location: [place] NEURO ORS;  Service: Neurosurgery;  Laterality: Left;  Left craniotomy for evacuation of subdural hematoma  . LOWER EXTREMITY ANGIOGRAM  April 2013  . ORIF TOE FRACTURE Left 04/25/2019   Procedure: Open Reduction Internal Fixation (ORIF) Left Fifth Metatarsal Ronnald Ramp Fracture;  Surgeon: Wylene Simmer, MD;  Location: [place];  Service: Orthopedics;  Laterality: Left;  . OVARIAN CYST REMOVAL  1983  . POSTERIOR FUSION LUMBAR SPINE  ` 2000  . SPINE SURGERY    . stomach and skin tuck  2000    There were no vitals filed for this visit.  Subjective Assessment - 07/31/19 1537    Subjective  No new pains, I did very well with the needling, felt good by Saturday.    Pertinent History  foot surgery 8 weeks ago;  "[name]";  lumbar fusion    Currently in Pain?  Yes    Pain Score  4     Pain Location  Buttocks    Pain Orientation  Left  Pain Descriptors / Indicators  Aching    Multiple Pain Sites  No                       [facility] Adult PT Treatment/Exercise - 07/31/19 0001      Self-Care   Self-Care  Posture    Posture  Gave pt information on where to purchase a horseshoe cushiion to sit on. Pt understood how to purchase.       Knee/Hip Exercises: Stretches   Active Hamstring Stretch  Left;3 reps;30 seconds    Active Hamstring Stretch Limitations  with strap and bias    Piriformis Stretch  Both;2 reps;20 seconds      Knee/Hip Exercises: Standing   Other Standing Knee Exercises  Donkey kicks with support of counter top for glutes, TC to dissassociate hip & back 10-15x       Knee/Hip Exercises: Supine   Bridges  Strengthening;Both;1 set;15 reps   Vc to contract glutes   Bridges Limitations  15x with green band around thighs      Knee/Hip  Exercises: Sidelying   Hip ABduction  Strengthening;Both;1 set;10 reps    Clams  green band around thighs 15x left only              PT Education - 07/31/19 1605    Education Details  Counter top [employer]  for HEP for glute strength    Person(s) Educated  Patient    Methods  Explanation;Demonstration;Tactile cues;Verbal cues;Handout    Comprehension  Returned demonstration;Verbalized understanding       PT Short Term Goals - 07/23/19 1518      PT SHORT TERM GOAL #1   Title  The patient will demonstrate understanding of basic self care strategies including sitting limitation, using a soft cushion, basic HEP    Time  4    Period  Weeks    Status  New    Target Date  08/20/19      PT SHORT TERM GOAL #2   Title  The patient will report a 30% improvement in left buttock pain with sitting at church    Time  4    Period  Weeks    Status  New      PT SHORT TERM GOAL #3   Title  The patient will have improved left hip flexion to 125 degrees and external rotation to 40 degrees needed for improved mobility with ADLs    Time  4    Period  Weeks    Status  New        PT Long Term Goals - 07/23/19 1522      PT LONG TERM GOAL #1   Title  The patient will be independent with safe self progression of HEP    Time  8    Period  Weeks    Status  New    Target Date  09/17/19      PT LONG TERM GOAL #2   Title  The patient will report a 60% reduction in left buttock pain with sitting at home and at church    Time  8    Period  Weeks    Status  New      PT LONG TERM GOAL #3   Title  The patient will have improved trunk and left hip extension and abduction strength to grossly 4+/5 needed for improved sitting,  standing, walking and stair climbing tolerance    Time  8  Period  Weeks    Status  New      PT LONG TERM GOAL #4   Title  The patient will have improved FOTO functional outcome score to 37% indicating improved function with less pain    Time  8    Period  Weeks     Status  New            Plan - 07/31/19 1535    Clinical Impression Statement  Pt arrives with no increased pain ( which she was happy about) and reports that the dry needling felt beneficial and she noticed a difference bu [place]. Pt was given donkey kicks at the counter top to continue facilittaing her glute strength. Pt initially required tactile cuing to get the correct motion. No increase of pain during treatment.    Personal Factors and Comorbidities  Age;Past/Current Experience;Comorbidity 1;Comorbidity 2;Comorbidity 3+;Time since onset of injury/illness/exacerbation    Comorbidities  MI, CVA, right drop foot, diabetes, subdural hematoma, HTN    Examination-Activity Limitations  Sit;Stand;Stairs    Examination-Participation Restrictions  Psychologist, sport and exercise;Other    Stability/Clinical Decision Making  Evolving/Moderate complexity    Rehab Potential  Good    PT Frequency  2x / week    PT Duration  8 weeks    PT Treatment/Interventions  ADLs/Self Care Home Management;Electrical Stimulation;Moist Heat;Patient/family education;Manual techniques;Dry needling;Taping;Therapeutic exercise;Therapeutic activities;Neuromuscular re-education;Functional mobility training;Iontophoresis 4mg /ml Dexamethasone    PT Next Visit Plan  DN #2, review donkey kicks in standing, continue to facilitate glute strength amd stretch rotators.    PT Home Exercise Plan  Access Code: [email or web address]    Consulted and Agree with Plan of Care  Patient       Patient will benefit from skilled therapeutic intervention in order to improve the following deficits and impairments:  Pain, Decreased range of motion, Increased fascial restricitons, Impaired perceived functional ability, Decreased strength  Visit Diagnosis: Pain in left hip  Stiffness of left hip, not elsewhere classified  Muscle weakness (generalized)     Problem List Patient Active Problem List   Diagnosis Date Noted  . MDD (major depressive  disorder) 10/15/2018  . Peripheral vascular disease, unspecified ([place]) 03/14/2014  . Aftercare following surgery of the circulatory system, [place] 03/14/2014  . Occlusion and stenosis of carotid artery without mention of cerebral infarction 02/11/2013  . SDH (subdural hematoma) ([place]) 03/02/2012  . S/P craniotomy, 02/25/12 after fall, SDH 02/27/2012  . Bradycardia 02/27/2012  . Obesity 02/27/2012  . Diabetes mellitus 02/27/2012  . CAD, cardiac arrest, CPR, CABG in [facility] Dec 2011-NL LVF by Echo, neg Nuc April 2013 02/27/2012  . Anxiety, ? residual hypoxic brain inhury from cardiac arrest 2011 02/27/2012  . Peripheral artery disease, bilta LE PVD by angio April 2013 01/18/2012  . HLD (hyperlipidemia) 01/18/2012  . HTN (hypertension) 01/18/2012  . Renal artery stenosis, Right, 75% 01/18/2012    Houa Nie, PTA 07/31/2019, 4:11 PM  [facility] [address], [place], [place], [address] Phone: [phone]   Fax:  [phone]  Name: Alicia Crawford MRN: [id number] Date of Birth: 1955-12-10

## 2019-08-06 ENCOUNTER — Encounter: Payer: Self-pay | Admitting: Physical Therapy

## 2019-08-06 ENCOUNTER — Other Ambulatory Visit: Payer: Self-pay

## 2019-08-06 ENCOUNTER — Ambulatory Visit: Payer: PPO | Attending: Family Medicine | Admitting: Physical Therapy

## 2019-08-06 DIAGNOSIS — M6281 Muscle weakness (generalized): Secondary | ICD-10-CM | POA: Diagnosis not present

## 2019-08-06 DIAGNOSIS — M25552 Pain in left hip: Secondary | ICD-10-CM | POA: Diagnosis not present

## 2019-08-06 DIAGNOSIS — M25652 Stiffness of left hip, not elsewhere classified: Secondary | ICD-10-CM | POA: Diagnosis not present

## 2019-08-06 NOTE — Therapy (Signed)
[facility] [address] [place], [place], [address] Phone: [phone]   Fax:  [phone]  Physical Therapy Treatment  Patient Details  Name: Alicia Crawford MRN: [id number] Date of Birth: 1956-06-16 Referring Provider (PT): Dr. London Pepper   Encounter Date: 08/06/2019  PT End of Session - 08/06/19 0922    Visit Number  4    Date for PT Re-Evaluation  09/17/19    Authorization Type  [employer]    PT Start Time  0846    PT Stop Time  0927    PT Time Calculation (min)  41 min    Activity Tolerance  Patient tolerated treatment well       Past Medical History:  Diagnosis Date  . Ankle fracture 09/05/2016   right ankle  . Anoxic brain injury ([place]) 08/2010   "w/cardiac arrest"  . Anxiety Jan. 2012  . Blood transfusion 09/2010  . Cardiac arrest ([place]) 08/04/2010  . Carotid artery occlusion    mild to moderate bilateral internal carotid artery stenosis  . Claudication ([place])   . Complication of anesthesia    [place]  . Coronary artery disease    CABG 2011 in [place]  . Coronary artery disease   . Dementia   . Depression   . DVT (deep venous thrombosis) ([place])   . GERD (gastroesophageal reflux disease)   . High cholesterol   . Hypertension   . Myocardial infarction [facility]) 09/27/10   in [place]  . Obesity   . Peripheral vascular disease (HCC)    bilat SFA diseae  . Renal artery stenosis ([place])   . Rotator cuff tendonitis   . Sleep apnea    uses CPAP nightly  . Stroke [facility]) 2011  . Subdural hematoma [facility]) May 25,2013   Frontal subdural  S/P evacuation of hematoma  . Type II diabetes mellitus (HCC)    Diet and Exercise    Past Surgical History:  Procedure Laterality Date  . ATHERECTOMY N/A 01/17/2012   Procedure: ATHERECTOMY;  Surgeon: Lorretta Harp, MD;  Location: [facility] CATH LAB;  Service: Cardiovascular;  Laterality: N/A;  . BACK SURGERY    . CHOLECYSTECTOMY  ~ 1980's  . CORONARY  ARTERY BYPASS GRAFT  09/2010   CABG X4  . CRANIOTOMY  02/25/2012   Procedure: CRANIOTOMY HEMATOMA EVACUATION SUBDURAL;  Surgeon: Eustace Moore, MD;  Location: [place] NEURO ORS;  Service: Neurosurgery;  Laterality: Left;  Left craniotomy for evacuation of subdural hematoma  . LOWER EXTREMITY ANGIOGRAM  April 2013  . ORIF TOE FRACTURE Left 04/25/2019   Procedure: Open Reduction Internal Fixation (ORIF) Left Fifth Metatarsal Ronnald Ramp Fracture;  Surgeon: Wylene Simmer, MD;  Location: [place];  Service: Orthopedics;  Laterality: Left;  . OVARIAN CYST REMOVAL  1983  . POSTERIOR FUSION LUMBAR SPINE  ` 2000  . SPINE SURGERY    . stomach and skin tuck  2000    There were no vitals filed for this visit.  Subjective Assessment - 08/06/19 0851    Subjective  I'm still taking Tylenol several times a day.   I've ordered a cushion from [employer].  I felt better after DN.  Went to church and it was a tad better.    Pertinent History  foot surgery 8 weeks ago;  "[name]";  lumbar fusion    Patient Stated Goals  get to the bottom of what is wrong with it and get on with my life  Currently in Pain?  Yes    Pain Score  4     Pain Orientation  Right;Left    Pain Type  Chronic pain                       [facility] Adult PT Treatment/Exercise - 08/06/19 0001      Knee/Hip Exercises: Stretches   Other Knee/Hip Stretches  verbal review of other previously give ex      Knee/Hip Exercises: Standing   Other Standing Knee Exercises  on elbows on tall table 10x right/left       Knee/Hip Exercises: Prone   Hip Extension  AROM;Right;Left    Hip Extension Limitations  straight and bent knee 5x each right/left       Moist Heat Therapy   Number Minutes Moist Heat  12 Minutes    Moist Heat Location  Hip      Electrical Stimulation   Electrical Stimulation Location  prox HS    Electrical Stimulation Action  IFC    Electrical Stimulation Parameters  9 ma 12 min prone     Electrical  Stimulation Goals  Pain      Manual Therapy   Soft tissue mobilization  prox HS bil        Trigger Point Dry Needling - 08/06/19 0001    Consent Given?  Yes    Hamstring Response  Twitch response elicited;Palpable increased muscle length             PT Short Term Goals - 07/23/19 1518      PT SHORT TERM GOAL #1   Title  The patient will demonstrate understanding of basic self care strategies including sitting limitation, using a soft cushion, basic HEP    Time  4    Period  Weeks    Status  New    Target Date  08/20/19      PT SHORT TERM GOAL #2   Title  The patient will report a 30% improvement in left buttock pain with sitting at church    Time  4    Period  Weeks    Status  New      PT SHORT TERM GOAL #3   Title  The patient will have improved left hip flexion to 125 degrees and external rotation to 40 degrees needed for improved mobility with ADLs    Time  4    Period  Weeks    Status  New        PT Long Term Goals - 07/23/19 1522      PT LONG TERM GOAL #1   Title  The patient will be independent with safe self progression of HEP    Time  8    Period  Weeks    Status  New    Target Date  09/17/19      PT LONG TERM GOAL #2   Title  The patient will report a 60% reduction in left buttock pain with sitting at home and at church    Time  8    Period  Weeks    Status  New      PT LONG TERM GOAL #3   Title  The patient will have improved trunk and left hip extension and abduction strength to grossly 4+/5 needed for improved sitting,  standing, walking and stair climbing tolerance    Time  8    Period  Weeks    Status  New      PT LONG TERM GOAL #4   Title  The patient will have improved FOTO functional outcome score to 37% indicating improved function with less pain    Time  8    Period  Weeks    Status  New            Plan - 08/06/19 [id number]    Clinical Impression Statement  The patient is able to perform gluteal strengthening ex's without  exacerbation of pain.  She is very receptive to dry needling #2 with a very positive initial result especially with ES/heat following.  Multiple twitch responses produced which is a good prognostic indicator.  She demonstrates compliance with HEP and with cushion she has ordered, this should improve long term improvement.  Therapist providing verbal cues to avoid compensatory lumbar extension rather than hip extension.    Comorbidities  MI, CVA, right drop foot, diabetes, subdural hematoma, HTN    Rehab Potential  Good    PT Frequency  2x / week    PT Duration  8 weeks    PT Treatment/Interventions  ADLs/Self Care Home Management;Electrical Stimulation;Moist Heat;Patient/family education;Manual techniques;Dry needling;Taping;Therapeutic exercise;Therapeutic activities;Neuromuscular re-education;Functional mobility training;Iontophoresis 4mg /ml Dexamethasone    PT Next Visit Plan  assess response to DN #2, review donkey kicks in standing, continue to facilitate glute strength amd stretch rotators.    PT Home Exercise Plan  Access Code: [email or web address]       Patient will benefit from skilled therapeutic intervention in order to improve the following deficits and impairments:  Pain, Decreased range of motion, Increased fascial restricitons, Impaired perceived functional ability, Decreased strength  Visit Diagnosis: Pain in left hip  Stiffness of left hip, not elsewhere classified  Muscle weakness (generalized)     Problem List Patient Active Problem List   Diagnosis Date Noted  . MDD (major depressive disorder) 10/15/2018  . Peripheral vascular disease, unspecified ([place]) 03/14/2014  . Aftercare following surgery of the circulatory system, [place] 03/14/2014  . Occlusion and stenosis of carotid artery without mention of cerebral infarction 02/11/2013  . SDH (subdural hematoma) ([place]) 03/02/2012  . S/P craniotomy, 02/25/12 after fall, SDH 02/27/2012  . Bradycardia 02/27/2012  . Obesity 02/27/2012  .  Diabetes mellitus 02/27/2012  . CAD, cardiac arrest, CPR, CABG in [facility] Dec 2011-NL LVF by Echo, neg Nuc April 2013 02/27/2012  . Anxiety, ? residual hypoxic brain inhury from cardiac arrest 2011 02/27/2012  . Peripheral artery disease, bilta LE PVD by angio April 2013 01/18/2012  . HLD (hyperlipidemia) 01/18/2012  . HTN (hypertension) 01/18/2012  . Renal artery stenosis, Right, 75% 01/18/2012   Ruben Im, PT 08/06/19 10:23 AM Phone: [phone] Fax: [phone] Alicia Crawford 08/06/2019, 10:23 AM  [facility] [address] [place], [place], [address] Phone: [phone]   Fax:  [phone]  Name: Alicia Crawford MRN: [id number] Date of Birth: [birth year]

## 2019-08-09 ENCOUNTER — Ambulatory Visit: Payer: PPO | Admitting: Physical Therapy

## 2019-08-09 ENCOUNTER — Other Ambulatory Visit: Payer: Self-pay

## 2019-08-09 ENCOUNTER — Encounter: Payer: Self-pay | Admitting: Physical Therapy

## 2019-08-09 DIAGNOSIS — M6281 Muscle weakness (generalized): Secondary | ICD-10-CM

## 2019-08-09 DIAGNOSIS — M25552 Pain in left hip: Secondary | ICD-10-CM

## 2019-08-09 DIAGNOSIS — M25652 Stiffness of left hip, not elsewhere classified: Secondary | ICD-10-CM

## 2019-08-09 NOTE — Therapy (Addendum)
[facility] [address], [place], [place], [address] Phone: [phone]   Fax:  [phone]  Physical Therapy Treatment/Discharge Summary  Patient Details  Name: Alicia Crawford MRN: [id number] Date of Birth: 22-Jun-1956 Referring Provider (PT): Dr. London Pepper   Encounter Date: 08/09/2019  PT End of Session - 08/09/19 0917    Visit Number  5    Date for PT Re-Evaluation  09/17/19    Authorization Type  [employer]    PT Start Time  2993   pt late   PT Stop Time  0928    PT Time Calculation (min)  31 min    Activity Tolerance  Patient tolerated treatment well       Past Medical History:  Diagnosis Date  . Ankle fracture 09/05/2016   right ankle  . Anoxic brain injury ([place]) 08/2010   "w/cardiac arrest"  . Anxiety Jan. 2012  . Blood transfusion 09/2010  . Cardiac arrest ([place]) 08/04/2010  . Carotid artery occlusion    mild to moderate bilateral internal carotid artery stenosis  . Claudication ([place])   . Complication of anesthesia    [place]  . Coronary artery disease    CABG 2011 in [place]  . Coronary artery disease   . Dementia   . Depression   . DVT (deep venous thrombosis) ([place])   . GERD (gastroesophageal reflux disease)   . High cholesterol   . Hypertension   . Myocardial infarction [facility]) 09/27/10   in [place]  . Obesity   . Peripheral vascular disease (HCC)    bilat SFA diseae  . Renal artery stenosis ([place])   . Rotator cuff tendonitis   . Sleep apnea    uses CPAP nightly  . Stroke [facility]) 2011  . Subdural hematoma [facility]) May 25,2013   Frontal subdural  S/P evacuation of hematoma  . Type II diabetes mellitus (HCC)    Diet and Exercise    Past Surgical History:  Procedure Laterality Date  . ATHERECTOMY N/A 01/17/2012   Procedure: ATHERECTOMY;  Surgeon: Lorretta Harp, MD;  Location: [facility] CATH LAB;  Service: Cardiovascular;  Laterality: N/A;  . BACK SURGERY    . CHOLECYSTECTOMY   ~ 1980's  . CORONARY ARTERY BYPASS GRAFT  09/2010   CABG X4  . CRANIOTOMY  02/25/2012   Procedure: CRANIOTOMY HEMATOMA EVACUATION SUBDURAL;  Surgeon: Eustace Moore, MD;  Location: [place] NEURO ORS;  Service: Neurosurgery;  Laterality: Left;  Left craniotomy for evacuation of subdural hematoma  . LOWER EXTREMITY ANGIOGRAM  April 2013  . ORIF TOE FRACTURE Left 04/25/2019   Procedure: Open Reduction Internal Fixation (ORIF) Left Fifth Metatarsal Ronnald Ramp Fracture;  Surgeon: Wylene Simmer, MD;  Location: [place];  Service: Orthopedics;  Laterality: Left;  . OVARIAN CYST REMOVAL  1983  . POSTERIOR FUSION LUMBAR SPINE  ` 2000  . SPINE SURGERY    . stomach and skin tuck  2000    There were no vitals filed for this visit.  Subjective Assessment - 08/09/19 0858    Subjective  Patient arrives late.  I think I'm a little better.  My cushion from [employer].  I'll use it at church this weekend.  I didn't even take Tylenol this morning.    Pertinent History  foot surgery 8 weeks ago;  "[name]";  lumbar fusion    Currently in Pain?  Yes    Pain Score  1     Pain Location  Hip  Pain Orientation  Right;Left                       [facility] Adult PT Treatment/Exercise - 08/09/19 0001      Knee/Hip Exercises: Stretches   Active Hamstring Stretch  Left;3 reps;30 seconds    Active Hamstring Stretch Limitations  with strap and bias    Piriformis Stretch  Both;2 reps;20 seconds    Piriformis Stretch Limitations  with red physioball       Knee/Hip Exercises: Supine   Bridges  AROM;15 reps    Bridges Limitations  red physioball     Other Supine Knee/Hip Exercises  abdominal brace with isometric UE and LE push into red ball 5x each       Knee/Hip Exercises: Sidelying   Clams  green band 10x right/left       Knee/Hip Exercises: Prone   Hip Extension  AROM;Right;Left    Hip Extension Limitations  straight and bent knee 10x each right/left       Manual Therapy   Soft tissue  mobilization  instrument assisted right/left HS 8 min                PT Short Term Goals - 07/23/19 1518      PT SHORT TERM GOAL #1   Title  The patient will demonstrate understanding of basic self care strategies including sitting limitation, using a soft cushion, basic HEP    Time  4    Period  Weeks    Status  New    Target Date  08/20/19      PT SHORT TERM GOAL #2   Title  The patient will report a 30% improvement in left buttock pain with sitting at church    Time  4    Period  Weeks    Status  New      PT SHORT TERM GOAL #3   Title  The patient will have improved left hip flexion to 125 degrees and external rotation to 40 degrees needed for improved mobility with ADLs    Time  4    Period  Weeks    Status  New        PT Long Term Goals - 07/23/19 1522      PT LONG TERM GOAL #1   Title  The patient will be independent with safe self progression of HEP    Time  8    Period  Weeks    Status  New    Target Date  09/17/19      PT LONG TERM GOAL #2   Title  The patient will report a 60% reduction in left buttock pain with sitting at home and at church    Time  8    Period  Weeks    Status  New      PT LONG TERM GOAL #3   Title  The patient will have improved trunk and left hip extension and abduction strength to grossly 4+/5 needed for improved sitting,  standing, walking and stair climbing tolerance    Time  8    Period  Weeks    Status  New      PT LONG TERM GOAL #4   Title  The patient will have improved FOTO functional outcome score to 37% indicating improved function with less pain    Time  8    Period  Weeks    Status  New  Plan - 08/09/19 0918    Clinical Impression Statement  Verbal and tactile cues needed to activate glute medius and transverse abdominus muscles without compensatory strategies.  Excellent response to instrument assisted soft tissue technique to HS, gluteals and piriformis.  Therapist monitoring response to all  treatment interventions.  On track to meet STGs.    Comorbidities  MI, CVA, right drop foot, diabetes, subdural hematoma, HTN    Rehab Potential  Good    PT Frequency  2x / week    PT Duration  8 weeks    PT Treatment/Interventions  ADLs/Self Care Home Management;Electrical Stimulation;Moist Heat;Patient/family education;Manual techniques;Dry needling;Taping;Therapeutic exercise;Therapeutic activities;Neuromuscular re-education;Functional mobility training;Iontophoresis 84m/ml Dexamethasone    PT Next Visit Plan  DN; manual therapy continue to facilitate glute strength amd stretch rotators; core strength;  ES/heat as needed    PT Home Exercise Plan  Access Code: [email or web address]       Patient will benefit from skilled therapeutic intervention in order to improve the following deficits and impairments:  Pain, Decreased range of motion, Increased fascial restricitons, Impaired perceived functional ability, Decreased strength  Visit Diagnosis: Pain in left hip  Stiffness of left hip, not elsewhere classified  Muscle weakness (generalized)    PHYSICAL THERAPY DISCHARGE SUMMARY  Visits from Start of Care: 5  Current functional level related to goals / functional outcomes: The patient cancelled her last remaining visits due to illness.  She has not called to reschedule.  Will discharge from PT at this time.  She will need a new PT order if she wishes to resume.     Remaining deficits: As above   Education / Equipment: HEP  Plan:                                                    Patient goals were not met. Patient is being discharged due to not returning since the last visit.  ?????      Problem List Patient Active Problem List   Diagnosis Date Noted  . MDD (major depressive disorder) 10/15/2018  . Peripheral vascular disease, unspecified ([place] 03/14/[place]  . [place] of [place] circulatory [place]  . Occlusion and stenosis of carotid artery without mention  of cerebral infarction 02/11/2013  . SD[place] (subdural [place]) [place] 03/02/2012  . S/P craniotomy, 02/25/12 after fall, SDH 02/27/2012  . Bradycardia 02/27/2012  . Obesity 02/27/2012  . Diabetes mellitus 02/27/2012  . [facility] [facility] by [facility], neg Nuc April 2013 02/27/2012  . Anxiety, ? residual hypoxic brain inhury from cardiac arrest 2011 02/27/2012  . Peripheral artery disease, bilta LE PVD by angio April 2013 01/18/2012  . HLD (hyperlipidemia) 01/18/2012  . HTN (hypertension) 01/18/2012  . Renal artery stenosis, Right, 75% 01/18/2012   SRuben Im PT 08/09/19 12:18 PM Phone: [phone]: [phone] Singh11/03/2019, 12:17 PM  [facility] [address] [place] [address]Phone: [phone]  Fax:  [phone] Name: Alicia BenthallMRN: [id number] of Birth: [birth year]

## 2019-08-15 ENCOUNTER — Encounter: Payer: PPO | Admitting: Physical Therapy

## 2019-08-19 ENCOUNTER — Encounter (INDEPENDENT_AMBULATORY_CARE_PROVIDER_SITE_OTHER): Payer: PPO | Admitting: Ophthalmology

## 2019-08-20 ENCOUNTER — Ambulatory Visit (INDEPENDENT_AMBULATORY_CARE_PROVIDER_SITE_OTHER): Payer: PPO | Admitting: Psychiatry

## 2019-08-20 ENCOUNTER — Encounter: Payer: Self-pay | Admitting: Psychiatry

## 2019-08-20 ENCOUNTER — Other Ambulatory Visit: Payer: Self-pay

## 2019-08-20 DIAGNOSIS — F325 Major depressive disorder, single episode, in full remission: Secondary | ICD-10-CM

## 2019-08-20 MED ORDER — SERTRALINE HCL 100 MG PO TABS: 200.0000 mg | ORAL_TABLET | Freq: Every day | ORAL | 2 refills | Status: DC

## 2019-08-20 NOTE — Progress Notes (Signed)
[name] [id number] 03-Dec-1955 63 y.o.  Virtual Visit via Telephone Note  I connected with pt on 08/20/19 at  1:00 PM EST by telephone and verified that I am speaking with the correct person using two identifiers.   I discussed the limitations, risks, security and privacy concerns of performing an evaluation and management service by telephone and the availability of in person appointments. I also discussed with the patient that there may be a patient responsible charge related to this service. The patient expressed understanding and agreed to proceed.   I discussed the assessment and treatment plan with the patient. The patient was provided an opportunity to ask questions and all were answered. The patient agreed with the plan and demonstrated an understanding of the instructions.   The patient was advised to call back or seek an in-person evaluation if the symptoms worsen or if the condition fails to improve as anticipated.  I provided 15 minutes of non-face-to-face time during this encounter.  The patient was located at home.  The provider was located at [place].   Thayer Headings, PMHNP   Subjective:   Patient ID:  Alicia Crawford is a 63 y.o. (DOB [birth year]) female.  Chief Complaint:  Chief Complaint  Patient presents with  . Follow-up    History of depression and anxiety    HPI Alicia Crawford presents for follow-up of depression and anxiety. She reports that she has some anxiety r/t pandemic. She reports that her family would like to have Thanksgiving at her house and she has some mild anxiety about possible exposure risk. "I'm doing wonderful... my medication is doing great for me." She reports that her anxiety has been well controlled with Sertraline. Denies any recent panic attacks.  She reports that her mood has been "good overall" with occ brief situational sadness. Denies persistent depression. She reports that she is sleeping well with  Melatonin. Appetite has been stable and more than she would like. Has had some wt gain with decreased physical activity. Energy and motivation have been good and reports that she has been walking her dogs regularly. She reports occasional difficulty with concentration and forgetfulness. Has been trying to read more and is able to focus on what she is reading. Denies SI.   Review of Systems:  Review of Systems  Gastrointestinal: Negative.   Musculoskeletal: Negative for gait problem.       Fx'd bone in foot and had surgical repair. Reports that she is now seeing PT to help with walking after over-compensating for injury and now having tightness in her legs and glutes.   Neurological: Negative for tremors and headaches.  Hematological: Bruises/bleeds easily.  Psychiatric/Behavioral:       Please refer to HPI    Medications: I have reviewed the patient's current medications.  Current Outpatient Medications  Medication Sig Dispense Refill  . Ascorbic Acid (VITAMIN C) 100 MG tablet Take 100 mg by mouth daily.    [name] aspirin EC 81 MG tablet Take 81 mg by mouth daily.    [name] atorvastatin (LIPITOR) 80 MG tablet TAKE 1 TABLET BY MOUTH DAILY 90 tablet 3  . cilostazol (PLETAL) 100 MG tablet TAKE 1 TABLET BY MOUTH TWICE DAILY BEFORE MEALS 180 tablet 3  . Coenzyme Q10 (EQL COQ10) 300 MG CAPS Take 1 capsule by mouth daily.    . Evolocumab (REPATHA SURECLICK) [id number] MG/ML SOAJ Inject 140 mg into the skin every 14 (fourteen) days. 2 pen 11  . losartan (COZAAR) 50  MG tablet TAKE 1 TABLET BY MOUTH DAILY 90 tablet 1  . MELATONIN PO Take by mouth.    . metFORMIN (GLUCOPHAGE) 1000 MG tablet Take 1,000 mg by mouth daily.   0  . metoprolol tartrate (LOPRESSOR) 25 MG tablet TAKE 1 TABLET(25 MG) BY MOUTH TWICE DAILY 60 tablet 5  . Multiple Vitamins-Minerals (ZINC PO) Take by mouth.    . omega-3 fish oil (MAXEPA) 1000 MG CAPS capsule Take 1 capsule by mouth daily. Mega Red    . Multiple Vitamin (MULITIVITAMIN WITH  MINERALS) TABS Take 1 tablet by mouth daily.    . sertraline (ZOLOFT) 100 MG tablet Take 2 tablets (200 mg total) by mouth daily. 180 tablet 2   No current facility-administered medications for this visit.     Medication Side Effects: None  Allergies:  Allergies  Allergen Reactions  . [place] Hives and Other (See Comments)    Gas used 1980's Reaction: affected her liver    Past Medical History:  Diagnosis Date  . Ankle fracture 09/05/2016   right ankle  . Anoxic brain injury ([place]) 08/2010   "w/cardiac arrest"  . Anxiety Jan. 2012  . Blood transfusion 09/2010  . Cardiac arrest ([place]) 08/04/2010  . Carotid artery occlusion    mild to moderate bilateral internal carotid artery stenosis  . Claudication ([place])   . Complication of anesthesia    [place]  . Coronary artery disease    CABG 2011 in [place]  . Coronary artery disease   . Dementia   . Depression   . DVT (deep venous thrombosis) ([place])   . GERD (gastroesophageal reflux disease)   . High cholesterol   . Hypertension   . Myocardial infarction [facility]) 09/27/10   in [place]  . Obesity   . Peripheral vascular disease (HCC)    bilat SFA diseae  . Renal artery stenosis ([place])   . Rotator cuff tendonitis   . Sleep apnea    uses CPAP nightly  . Stroke [facility]) 2011  . 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 Type II diabetes mellitus (HCC)    Diet and Exercise    Family History  Problem Relation Age of Onset  . Heart disease Mother        before age 26  . Diabetes Mother   . Hyperlipidemia Mother   . Hypertension Mother   . Heart attack Mother   . Heart disease Father        Before age 10  . Hyperlipidemia Father   . Hypertension Father   . Heart attack Father   . Heart disease Sister        Before age 75  . Heart attack Sister   . Hyperlipidemia Brother   . Hypertension Brother   . Heart attack Brother   . Heart disease Brother        before age 60   . Heart disease Other   . Diabetes Other   . Hypertension Other   . Alcohol abuse Other   . Mental illness Other     Social History   Socioeconomic History  . Marital status: Married    Spouse name: Not on file  . Number of children: Not on file  . Years of education: Not on file  . Highest education level: Not on file  Occupational History  . Not on file  Social Needs  . Financial resource strain: Not on file  .  Food insecurity    Worry: Not on file    Inability: Not on file  . Transportation needs    Medical: Not on file    Non-medical: Not on file  Tobacco Use  . Smoking status: Former Smoker    Packs/day: 1.00    Years: 30.00    Pack years: 30.00    Types: Cigarettes    Quit date: 06/23/2003    Years since quitting: 16.1  . Smokeless tobacco: Never Used  Substance and Sexual Activity  . Alcohol use: Not Currently    Alcohol/week: 0.0 standard drinks    Comment: 01/17/12 "used to have glass of wine now & then; last wine was ~ 2011"  . Drug use: No  . Sexual activity: Yes  Lifestyle  . Physical activity    Days per week: Not on file    Minutes per session: Not on file  . Stress: Not on file  Relationships  . Social Herbalist on phone: Not on file    Gets together: Not on file    Attends religious service: Not on file    Active member of club or organization: Not on file    Attends meetings of clubs or organizations: Not on file    Relationship status: Not on file  . Intimate partner violence    Fear of current or ex partner: Not on file    Emotionally abused: Not on file    Physically abused: Not on file    Forced sexual activity: Not on file  Other Topics Concern  . Not on file  Social History Narrative  . Not on file    Past Medical History, Surgical history, Social history, and Family history were reviewed and updated as appropriate.   Please see review of systems for further details on the patient's review from today.   Objective:    Physical Exam:  Wt 180 lb (81.6 kg)   BMI 30.90 kg/m   Physical Exam Constitutional:      General: She is not in acute distress.    Appearance: She is well-developed.  Musculoskeletal:        General: No deformity.  Neurological:     Mental Status: She is alert and oriented to person, place, and time.     Coordination: Coordination normal.  Psychiatric:        Attention and Perception: Attention and perception normal. She does not perceive auditory or visual hallucinations.        Mood and Affect: Mood normal. Mood is not anxious or depressed. Affect is not labile, blunt, angry or inappropriate.        Speech: Speech normal.        Behavior: Behavior normal.        Thought Content: Thought content normal. Thought content does not include homicidal or suicidal ideation. Thought content does not include homicidal or suicidal plan.        Cognition and Memory: Cognition and memory normal.        Judgment: Judgment normal.     Comments: Insight intact. No delusions.      Lab Review:     Component Value Date/Time   NA 140 04/22/2019 1227   K 4.5 04/22/2019 1227   CL 106 04/22/2019 1227   CO2 25 04/22/2019 1227   GLUCOSE 105 (H) 04/22/2019 1227   BUN 25 (H) 04/22/2019 1227   CREATININE 1.12 (H) 04/22/2019 1227   CALCIUM 9.3 04/22/2019 1227   PROT  6.8 03/02/2012 0635   ALBUMIN 3.1 (L) 03/02/2012 0635   AST 13 03/02/2012 0635   ALT 19 03/02/2012 0635   ALKPHOS 71 03/02/2012 0635   BILITOT 0.5 03/02/2012 0635   GFRNONAA 52 (L) 04/22/2019 1227   GFRAA 60 (L) 04/22/2019 1227       Component Value Date/Time   WBC 7.2 07/06/2012 1148   RBC 4.58 07/06/2012 1148   HGB 13.6 07/06/2012 1148   HCT 40.2 07/06/2012 1148   PLT 255 07/06/2012 1148   MCV 87.8 07/06/2012 1148   MCH 29.7 07/06/2012 1148   MCHC 33.8 07/06/2012 1148   RDW 14.8 07/06/2012 1148   LYMPHSABS 2.5 03/02/2012 0635   MONOABS 0.8 03/02/2012 0635   EOSABS 0.1 03/02/2012 0635   BASOSABS 0.0 03/02/2012  0635    No results found for: POCLITH, LITHIUM   No results found for: PHENYTOIN, PHENOBARB, VALPROATE, CBMZ   .res Assessment: Plan:   Will continue sertraline 200 mg daily for mood and anxiety since patient reports that mood and anxiety signs and symptoms have been well controlled with sertraline.  Patient reports that she would be interested in considering dose reduction in the future after the pandemic resolves and there are less risk of stressors and social isolation. Patient to follow-up in 6 months or sooner if clinically indicated. Patient advised to contact office with any questions, adverse effects, or acute worsening in signs and symptoms.  Mckaley was seen today for follow-up.  Diagnoses and all orders for this visit:  Major depressive disorder in full remission, unspecified whether recurrent (HCC) -     sertraline (ZOLOFT) 100 MG tablet; Take 2 tablets (200 mg total) by mouth daily.    Please see After Visit Summary for patient specific instructions.  Future Appointments  Date Time Provider [place]  08/22/2019  2:45 PM Alvera Singh, PT [facility]  02/17/2020  2:00 PM Thayer Headings, PMHNP [facility] None    No orders of the defined types were placed in this encounter.     -------------------------------

## 2019-08-22 ENCOUNTER — Encounter: Payer: PPO | Admitting: Physical Therapy

## 2019-09-21 ENCOUNTER — Other Ambulatory Visit: Payer: Self-pay | Admitting: Cardiovascular Disease

## 2019-10-30 DIAGNOSIS — F329 Major depressive disorder, single episode, unspecified: Secondary | ICD-10-CM | POA: Diagnosis not present

## 2019-10-30 DIAGNOSIS — Z0000 Encounter for general adult medical examination without abnormal findings: Secondary | ICD-10-CM | POA: Diagnosis not present

## 2019-10-30 DIAGNOSIS — I739 Peripheral vascular disease, unspecified: Secondary | ICD-10-CM | POA: Diagnosis not present

## 2019-10-30 DIAGNOSIS — E785 Hyperlipidemia, unspecified: Secondary | ICD-10-CM | POA: Diagnosis not present

## 2019-10-30 DIAGNOSIS — E1169 Type 2 diabetes mellitus with other specified complication: Secondary | ICD-10-CM | POA: Diagnosis not present

## 2019-10-30 DIAGNOSIS — E11319 Type 2 diabetes mellitus with unspecified diabetic retinopathy without macular edema: Secondary | ICD-10-CM | POA: Diagnosis not present

## 2019-10-30 DIAGNOSIS — I10 Essential (primary) hypertension: Secondary | ICD-10-CM | POA: Diagnosis not present

## 2019-11-05 ENCOUNTER — Encounter (INDEPENDENT_AMBULATORY_CARE_PROVIDER_SITE_OTHER): Payer: PPO | Admitting: Ophthalmology

## 2019-11-06 ENCOUNTER — Encounter (INDEPENDENT_AMBULATORY_CARE_PROVIDER_SITE_OTHER): Payer: PPO | Admitting: Ophthalmology

## 2019-11-06 DIAGNOSIS — H43813 Vitreous degeneration, bilateral: Secondary | ICD-10-CM | POA: Diagnosis not present

## 2019-11-06 DIAGNOSIS — E113211 Type 2 diabetes mellitus with mild nonproliferative diabetic retinopathy with macular edema, right eye: Secondary | ICD-10-CM | POA: Diagnosis not present

## 2019-11-06 DIAGNOSIS — E11311 Type 2 diabetes mellitus with unspecified diabetic retinopathy with macular edema: Secondary | ICD-10-CM

## 2019-11-06 DIAGNOSIS — I10 Essential (primary) hypertension: Secondary | ICD-10-CM | POA: Diagnosis not present

## 2019-11-06 DIAGNOSIS — H35033 Hypertensive retinopathy, bilateral: Secondary | ICD-10-CM | POA: Diagnosis not present

## 2019-11-06 DIAGNOSIS — E113312 Type 2 diabetes mellitus with moderate nonproliferative diabetic retinopathy with macular edema, left eye: Secondary | ICD-10-CM

## 2019-11-12 ENCOUNTER — Other Ambulatory Visit: Payer: Self-pay | Admitting: Cardiovascular Disease

## 2019-11-20 ENCOUNTER — Other Ambulatory Visit: Payer: Self-pay | Admitting: Cardiovascular Disease

## 2020-01-29 ENCOUNTER — Other Ambulatory Visit: Payer: Self-pay

## 2020-01-29 ENCOUNTER — Encounter (INDEPENDENT_AMBULATORY_CARE_PROVIDER_SITE_OTHER): Payer: PPO | Admitting: Ophthalmology

## 2020-01-29 DIAGNOSIS — H33301 Unspecified retinal break, right eye: Secondary | ICD-10-CM

## 2020-01-29 DIAGNOSIS — E113211 Type 2 diabetes mellitus with mild nonproliferative diabetic retinopathy with macular edema, right eye: Secondary | ICD-10-CM

## 2020-01-29 DIAGNOSIS — E11311 Type 2 diabetes mellitus with unspecified diabetic retinopathy with macular edema: Secondary | ICD-10-CM | POA: Diagnosis not present

## 2020-01-29 DIAGNOSIS — H43813 Vitreous degeneration, bilateral: Secondary | ICD-10-CM | POA: Diagnosis not present

## 2020-01-29 DIAGNOSIS — H35033 Hypertensive retinopathy, bilateral: Secondary | ICD-10-CM | POA: Diagnosis not present

## 2020-01-29 DIAGNOSIS — I10 Essential (primary) hypertension: Secondary | ICD-10-CM

## 2020-01-29 DIAGNOSIS — E113312 Type 2 diabetes mellitus with moderate nonproliferative diabetic retinopathy with macular edema, left eye: Secondary | ICD-10-CM | POA: Diagnosis not present

## 2020-02-09 ENCOUNTER — Other Ambulatory Visit: Payer: Self-pay | Admitting: Cardiovascular Disease

## 2020-02-12 ENCOUNTER — Other Ambulatory Visit: Payer: Self-pay | Admitting: Cardiovascular Disease

## 2020-02-12 ENCOUNTER — Telehealth: Payer: Self-pay | Admitting: Cardiovascular Disease

## 2020-02-12 NOTE — Telephone Encounter (Signed)
New Message   *STAT* If patient is at the pharmacy, call can be transferred to refill team.   1. Which medications need to be refilled? (please list name of each medication and dose if known)  losartan (COZAAR) 50 MG tablet metoprolol tartrate (LOPRESSOR) 25 MG tablet  2. Which pharmacy/location (including street and city if local pharmacy) is medication to be sent to? [place], [place] - [address] AT [place]  3. Do they need a 30 day or 90 day supply? 30 day   Patient is out of medication and needs enough until she is seen o 02/21/20. Please assist.

## 2020-02-17 ENCOUNTER — Telehealth (INDEPENDENT_AMBULATORY_CARE_PROVIDER_SITE_OTHER): Payer: PPO | Admitting: Psychiatry

## 2020-02-17 ENCOUNTER — Encounter: Payer: Self-pay | Admitting: Psychiatry

## 2020-02-17 DIAGNOSIS — F325 Major depressive disorder, single episode, in full remission: Secondary | ICD-10-CM | POA: Diagnosis not present

## 2020-02-17 MED ORDER — SERTRALINE HCL 100 MG PO TABS: 150.0000 mg | ORAL_TABLET | Freq: Every day | ORAL | 2 refills | Status: DC

## 2020-02-17 NOTE — Progress Notes (Signed)
Alicia Crawford [id number] 01/29/1956 64 y.o.  Virtual Visit via Telephone Note  I connected with pt on 02/17/20 at  2:00 PM EDT by telephone and verified that I am speaking with the correct person using two identifiers.   I discussed the limitations, risks, security and privacy concerns of performing an evaluation and management service by telephone and the availability of in person appointments. I also discussed with the patient that there may be a patient responsible charge related to this service. The patient expressed understanding and agreed to proceed.   I discussed the assessment and treatment plan with the patient. The patient was provided an opportunity to ask questions and all were answered. The patient agreed with the plan and demonstrated an understanding of the instructions.   The patient was advised to call back or seek an in-person evaluation if the symptoms worsen or if the condition fails to improve as anticipated.  I provided 15 minutes of non-face-to-face time during this encounter.  The patient was located at home.  The provider was located at [place].   Thayer Headings, PMHNP   Subjective:   Patient ID:  Alicia Crawford is a 64 y.o. (DOB 1956-06-29) female.  Chief Complaint: No chief complaint on file.   HPI Alicia Crawford presents for follow-up of depression and anxiety. She reports that she continues to have some anxiety about COVID. She has had 2 friends that have died from [place]. She has some anxiety with being around family and requests that they wash their hands and checks their temperature. She reports that she is able to see family about once a week and has some social interaction at church and will go out to dinner with her husband periodically. She reports that she does not have any significant anxiety otherwise. She reports that she has noticed "a little bit" of depression "but I'm doing alright." Sleeping well with Melatonin. Appetite  has been good. Wt has been stable. Energy and motivation has been good and is taking care of 4 dogs. She reports that she has been completing chores and preparing meals. She reports that she will occasionally forget things. Will sometimes have to read things over. Reports concentration has been adequate overall. Denies anhedonia. Denies SI.   Had MI in 2012 which is when her anxiety started.   Review of Systems:  Review of Systems  Gastrointestinal: Negative.   Musculoskeletal: Positive for back pain. Negative for gait problem.  Neurological: Negative for tremors and headaches.  Hematological: Bruises/bleeds easily.  Psychiatric/Behavioral:       Please refer to HPI   Reports that she has upcoming physical exam.    Medications: I have reviewed the patient's current medications.  Current Outpatient Medications  Medication Sig Dispense Refill  . Ascorbic Acid (VITAMIN C) 100 MG tablet Take 100 mg by mouth daily.    [name] aspirin EC 81 MG tablet Take 81 mg by mouth daily.    [name] atorvastatin (LIPITOR) 80 MG tablet TAKE 1 TABLET BY MOUTH DAILY 90 tablet 3  . cholecalciferol (VITAMIN D3) 25 MCG (1000 UNIT) tablet Take 1,000 Units by mouth daily.    . cilostazol (PLETAL) 100 MG tablet TAKE 1 TABLET BY MOUTH TWICE DAILY BEFORE MEALS 180 tablet 0  . Coenzyme Q10 (EQL COQ10) 300 MG CAPS Take 1 capsule by mouth daily.    . Evolocumab (REPATHA SURECLICK) XX123456 MG/ML SOAJ Inject 140 mg into the skin every 14 (fourteen) days. 2 pen 11  . losartan (COZAAR)  50 MG tablet Take 1 tablet (50 mg total) by mouth daily. NEED OV. 90 tablet 0  . MELATONIN PO Take by mouth.    . metFORMIN (GLUCOPHAGE) 1000 MG tablet Take 1,000 mg by mouth daily.   0  . metoprolol tartrate (LOPRESSOR) 25 MG tablet Take 1 tablet (25 mg total) by mouth 2 (two) times daily. NEED OV. 180 tablet 0  . Multiple Vitamin (MULITIVITAMIN WITH MINERALS) TABS Take 1 tablet by mouth daily.    . Multiple Vitamins-Minerals (ZINC PO) Take by mouth.     . omega-3 fish oil (MAXEPA) 1000 MG CAPS capsule Take 1 capsule by mouth daily. Mega Red    . sertraline (ZOLOFT) 100 MG tablet Take 1.5 tablets (150 mg total) by mouth daily. 180 tablet 2   No current facility-administered medications for this visit.    Medication Side Effects: None  Allergies:  Allergies  Allergen Reactions  . Halothane Hives and Other (See Comments)    Gas used 1980's Reaction: affected her liver    Past Medical History:  Diagnosis Date  . Ankle fracture 09/05/2016   right ankle  . Anoxic brain injury ([place]) 08/2010   "w/cardiac arrest"  . Anxiety Jan. 2012  . Blood transfusion 09/2010  . Cardiac arrest ([place]) 08/04/2010  . Carotid artery occlusion    mild to moderate bilateral internal carotid artery stenosis  . Claudication ([place])   . Complication of anesthesia    [place]  . Coronary artery disease    [place]  . Coronary artery disease   . Dementia   . Depression   . DVT (deep venous thrombosis) ([place])   . GERD (gastroesophageal reflux disease)   . High cholesterol   . Hypertension   . Myocardial infarction [facility]) 09/27/10   in [place]  . Obesity   . Peripheral vascular disease (HCC)    bilat SFA diseae  . Renal artery stenosis ([place])   . Rotator cuff tendonitis   . Sleep apnea    uses CPAP nightly  . Stroke [facility]) 2011  . Subdural hematoma [facility]) May 25,2013   Frontal subdural  S/P evacuation of hematoma  . Type II diabetes mellitus (HCC)    Diet and Exercise    Family History  Problem Relation Age of Onset  . Heart disease Mother        before age 26  . Diabetes Mother   . Hyperlipidemia Mother   . Hypertension Mother   . Heart attack Mother   . Heart disease Father        Before age 43  . Hyperlipidemia Father   . Hypertension Father   . Heart attack Father   . Heart disease Sister        Before age 25  . Heart attack Sister   . Hyperlipidemia Brother   . Hypertension Brother   . Heart attack Brother    . Heart disease Brother        before age 75  . Heart disease Other   . Diabetes Other   . Hypertension Other   . Alcohol abuse Other   . Mental illness Other     Social History   Socioeconomic History  . Marital status: Married    Spouse name: Not on file  . Number of children: Not on file  . Years of education: Not on file  . Highest education level: Not on file  Occupational History  . Not on file  Tobacco  Use  . Smoking status: Former Smoker    Packs/day: 1.00    Years: 30.00    Pack years: 30.00    Types: Cigarettes    Quit date: 06/23/2003    Years since quitting: 16.6  . Smokeless tobacco: Never Used  Substance and Sexual Activity  . Alcohol use: Not Currently    Alcohol/week: 0.0 standard drinks    Comment: 01/17/12 "used to have glass of wine now & then; last wine was ~ 2011"  . Drug use: No  . Sexual activity: Yes  Other Topics Concern  . Not on file  Social History Narrative  . Not on file   Social Determinants of Health   Financial Resource Strain:   . Difficulty of Paying Living Expenses:   Food Insecurity:   . Worried About Charity fundraiser in the Last Year:   . Arboriculturist in the Last Year:   Transportation Needs:   . Film/video editor (Medical):   [name] Lack of Transportation (Non-Medical):   Physical Activity:   . Days of Exercise per Week:   . Minutes of Exercise per Session:   Stress:   . Feeling of Stress :   Social Connections:   . Frequency of Communication with Friends and Family:   . Frequency of Social Gatherings with Friends and Family:   . Attends Religious Services:   . Active Member of Clubs or Organizations:   . Attends Archivist Meetings:   [name] Marital Status:   Intimate Partner Violence:   . Fear of Current or Ex-Partner:   . Emotionally Abused:   [name] Physically Abused:   . Sexually Abused:     Past Medical History, Surgical history, Social history, and Family history were reviewed and updated as  appropriate.   Please see review of systems for further details on the patient's review from today.   Objective:   Physical Exam:  Wt 185 lb (83.9 kg)   BMI 31.76 kg/m   Physical Exam Neurological:     Mental Status: She is alert and oriented to person, place, and time.     Cranial Nerves: No dysarthria.  Psychiatric:        Attention and Perception: Attention and perception normal.        Mood and Affect: Mood normal.        Speech: Speech normal.        Behavior: Behavior is cooperative.        Thought Content: Thought content normal. Thought content is not paranoid or delusional. Thought content does not include homicidal or suicidal ideation. Thought content does not include homicidal or suicidal plan.        Cognition and Memory: Cognition and memory normal.        Judgment: Judgment normal.     Comments: Insight intact     Lab Review:     Component Value Date/Time   NA 140 04/22/2019 1227   K 4.5 04/22/2019 1227   CL 106 04/22/2019 1227   CO2 25 04/22/2019 1227   GLUCOSE 105 (H) 04/22/2019 1227   BUN 25 (H) 04/22/2019 1227   CREATININE 1.12 (H) 04/22/2019 1227   CALCIUM 9.3 04/22/2019 1227   PROT 6.8 03/02/2012 0635   ALBUMIN 3.1 (L) 03/02/2012 0635   AST 13 03/02/2012 0635   ALT 19 03/02/2012 0635   ALKPHOS 71 03/02/2012 0635   BILITOT 0.5 03/02/2012 0635   GFRNONAA 52 (L) 04/22/2019 1227   GFRAA  60 (L) 04/22/2019 1227       Component Value Date/Time   WBC 7.2 07/06/2012 1148   RBC 4.58 07/06/2012 1148   HGB 13.6 07/06/2012 1148   HCT 40.2 07/06/2012 1148   PLT 255 07/06/2012 1148   MCV 87.8 07/06/2012 1148   MCH 29.7 07/06/2012 1148   MCHC 33.8 07/06/2012 1148   RDW 14.8 07/06/2012 1148   LYMPHSABS 2.5 03/02/2012 0635   MONOABS 0.8 03/02/2012 0635   EOSABS 0.1 03/02/2012 0635   BASOSABS 0.0 03/02/2012 0635    No results found for: POCLITH, LITHIUM   No results found for: PHENYTOIN, PHENOBARB, VALPROATE, CBMZ   .res Assessment: Plan:    Reports that she is thinking about decreasing Sertraline to 150 mg po qd and may consider further decrease based on response. Recommended decreasing dose gradually since it may take 4-6 weeks to determine full effects of decrease. Pt will decrease Sertraline to 150 mg po qd and reports that she will contact office if she would like to decrease further.  Pt to f/u in 6 months or sooner if clinically indicated.  Patient advised to contact office with any questions, adverse effects, or acute worsening in signs and symptoms.   Diagnoses and all orders for this visit:  Major depressive disorder in full remission, unspecified whether recurrent (HCC) -     sertraline (ZOLOFT) 100 MG tablet; Take 1.5 tablets (150 mg total) by mouth daily.    Please see After Visit Summary for patient specific instructions.  Future Appointments  Date Time Provider [place]  02/21/2020 10:15 AM Lorretta Harp, MD [facility]  04/22/2020 12:30 PM Hayden Pedro, MD [facility] None    No orders of the defined types were placed in this encounter.     -------------------------------

## 2020-02-21 ENCOUNTER — Other Ambulatory Visit: Payer: Self-pay

## 2020-02-21 ENCOUNTER — Encounter: Payer: Self-pay | Admitting: Cardiovascular Disease

## 2020-02-21 ENCOUNTER — Ambulatory Visit: Payer: PPO | Admitting: Cardiovascular Disease

## 2020-02-21 VITALS — BP 120/80 | HR 78 | Ht 64.0 in | Wt 195.0 lb

## 2020-02-21 DIAGNOSIS — I739 Peripheral vascular disease, unspecified: Secondary | ICD-10-CM

## 2020-02-21 DIAGNOSIS — E782 Mixed hyperlipidemia: Secondary | ICD-10-CM | POA: Diagnosis not present

## 2020-02-21 DIAGNOSIS — I10 Essential (primary) hypertension: Secondary | ICD-10-CM

## 2020-02-21 DIAGNOSIS — I2510 Atherosclerotic heart disease of native coronary artery without angina pectoris: Secondary | ICD-10-CM

## 2020-02-21 NOTE — Assessment & Plan Note (Signed)
Moderate left ICA stenosis by duplex ultrasound August 2020.  We will repeat carotid Doppler studies.

## 2020-02-21 NOTE — Progress Notes (Signed)
02/21/2020 Alicia Crawford   17-Apr-1956  [id number]  Primary Physician Eunice Blase, MD Primary Cardiologist: Lorretta Harp MD Lupe Carney, [place]  HPI:  Alicia Crawford is a 64 y.o.  moderately overweight, married Caucasian female, mother of 3 who I last spoke to on the phone for a virtual telemedicine phone visit 02/19/2019. She has a history of remote tobacco abuse having quit 10 years ago, hypertension, hyperlipidemia and a family history of heart disease with both mother and father who had CAD and a brother who had a stent. She had an episode of witnessed cardiac arrest, CPR, defibrillation and ultimately coronary artery bypass grafting in [facility] September 27, 2010. She had prolonged hospitalization on a ventilator and rehab. Echo performed April 2012 showed normal LV systolic function. A Myoview was normal as well. She otherwise is asymptomatic except for lifestyle-limiting claudication. Dopplers performed in our office December 06, 2011, revealed ABIs of 0.46 on the right with an occluded SFA and high-grade distal right common, profunda stenosis, left ABI of 0.62 with an occluded left SFA. I angiogram'd her January 17, 2012, demonstrating occluded SFAs bilaterally with 2-vessel runoff on the right, 1 on the left via peroneal. She did have a 95% calcified ostial profunda femoris stenosis on the right jeopardizing collaterals to her infrapopliteal vessels as well as a 60% distal left common femoral artery stenosis. She also had an incidentally noted 35% right renal artery stenosis. I reviewed the films with Dr. [name] who felt surgical revascularization was optimal. She in the interim developed a subdural hematoma after falling and hitting her head and her workup was put on hold. Today in the office she continues to complain of lifestyle-limiting claudication. She apparently saw Dr. [name] who recommended continued medical/conservative therapy.  Since I saw her back a year ago she is  remained asymptomatic.  She denies chest pain, shortness of breath or claudication.    Current Meds  Medication Sig  . Ascorbic Acid (VITAMIN C) 100 MG tablet Take 100 mg by mouth daily.  [name] aspirin EC 81 MG tablet Take 81 mg by mouth daily.  [name] atorvastatin (LIPITOR) 80 MG tablet TAKE 1 TABLET BY MOUTH DAILY  . cholecalciferol (VITAMIN D3) 25 MCG (1000 UNIT) tablet Take 1,000 Units by mouth daily.  . cilostazol (PLETAL) 100 MG tablet TAKE 1 TABLET BY MOUTH TWICE DAILY BEFORE MEALS  . Coenzyme Q10 (EQL COQ10) 300 MG CAPS Take 1 capsule by mouth daily.  . Evolocumab (REPATHA SURECLICK) [id number] MG/ML SOAJ Inject 140 mg into the skin every 14 (fourteen) days.  [name] losartan (COZAAR) 50 MG tablet Take 1 tablet (50 mg total) by mouth daily. NEED OV.  [name] MELATONIN PO Take by mouth.  . metFORMIN (GLUCOPHAGE) 1000 MG tablet Take 1,000 mg by mouth daily.   . metoprolol tartrate (LOPRESSOR) 25 MG tablet Take 1 tablet (25 mg total) by mouth 2 (two) times daily. NEED OV.  . Multiple Vitamin (MULITIVITAMIN WITH MINERALS) TABS Take 1 tablet by mouth daily.  . Multiple Vitamins-Minerals (ZINC PO) Take by mouth.  . omega-3 fish oil (MAXEPA) 1000 MG CAPS capsule Take 1 capsule by mouth daily. Mega Red  . sertraline (ZOLOFT) 100 MG tablet Take 1.5 tablets (150 mg total) by mouth daily.     Allergies  Allergen Reactions  . Halothane Hives and Other (See Comments)    Gas used 1980's Reaction: affected her liver    Social History   Socioeconomic History  . Marital  status: Married    Spouse name: Not on file  . Number of children: Not on file  . Years of education: Not on file  . Highest education level: Not on file  Occupational History  . Not on file  Tobacco Use  . Smoking status: Former Smoker    Packs/day: 1.00    Years: 30.00    Pack years: 30.00    Types: Cigarettes    Quit date: 06/23/2003    Years since quitting: 16.6  . Smokeless tobacco: Never Used  Substance and Sexual Activity  . Alcohol  use: Not Currently    Alcohol/week: 0.0 standard drinks    Comment: 01/17/12 "used to have glass of wine now & then; last wine was ~ 2011"  . Drug use: No  . Sexual activity: Yes  Other Topics Concern  . Not on file  Social History Narrative  . Not on file   Social Determinants of Health   Financial Resource Strain:   . Difficulty of Paying Living Expenses:   Food Insecurity:   . Worried About Charity fundraiser in the Last Year:   . Arboriculturist in the Last Year:   Transportation Needs:   . Film/video editor (Medical):   [name] Lack of Transportation (Non-Medical):   Physical Activity:   . Days of Exercise per Week:   . Minutes of Exercise per Session:   Stress:   . Feeling of Stress :   Social Connections:   . Frequency of Communication with Friends and Family:   . Frequency of Social Gatherings with Friends and Family:   . Attends Religious Services:   . Active Member of Clubs or Organizations:   . Attends Archivist Meetings:   [name] Marital Status:   Intimate Partner Violence:   . Fear of Current or Ex-Partner:   . Emotionally Abused:   [name] Physically Abused:   . Sexually Abused:      Review of Systems: General: negative for chills, fever, night sweats or weight changes.  Cardiovascular: negative for chest pain, dyspnea on exertion, edema, orthopnea, palpitations, paroxysmal nocturnal dyspnea or shortness of breath Dermatological: negative for rash Respiratory: negative for cough or wheezing Urologic: negative for hematuria Abdominal: negative for nausea, vomiting, diarrhea, bright red blood per rectum, melena, or hematemesis Neurologic: negative for visual changes, syncope, or dizziness All other systems reviewed and are otherwise negative except as noted above.    Blood pressure 120/80, pulse 78, height 5\' 4"  (1.626 m), weight 195 lb (88.5 kg), SpO2 96 %.  General appearance: alert and no distress Neck: no adenopathy, no carotid bruit, no JVD,  supple, symmetrical, trachea midline and thyroid not enlarged, symmetric, no tenderness/mass/nodules Lungs: clear to auscultation bilaterally Heart: regular rate and rhythm, S1, S2 normal, no murmur, click, rub or gallop Extremities: extremities normal, atraumatic, no cyanosis or edema Pulses: 2+ and symmetric Skin: Skin color, texture, turgor normal. No rashes or lesions Neurologic: Alert and oriented X 3, normal strength and tone. Normal symmetric reflexes. Normal coordination and gait  EKG sinus rhythm at 78 with nonspecific ST and T wave changes.  I personally reviewed this EKG.  ASSESSMENT AND PLAN:   Peripheral artery disease, bilta LE PVD by angio April 2013 History of peripheral arterial disease with angiography that I performed 01/17/2012 demonstrating occluded SFAs bilaterally with two-vessel runoff on the right, 1 on the left via peroneal artery and a 95% calcified ostial profunda femoris on the right jeopardizing collaterals.  I did refer her to Dr. [name] for consideration of surgical revascularization however he felt that she should have conservative/medical therapy.  HLD (hyperlipidemia) History of hyperlipidemia on high-dose statin therapy and Repatha with lipid profile performed 07/18/2019 revealing total cholesterol 111, LDL 25 and HDL of 53.  HTN (hypertension) History of essential hypertension with blood pressure measured today 120/80.  She is on Lopressor and Cozaar.  CAD, cardiac arrest, CPR, CABG in [facility] Dec 2011-NL LVF by Echo, neg Nuc April 2013 History of CAD status post emergent CABG in [facility] 09/27/2010 after witnessed cardiac arrest, see and CPR.  She did have a prolonged hospitalization there.  Ultimately her EF was shown to be normal.  Her last Myoview performed 02/12/2013 was low risk and nonischemic.  She denies chest pain or shortness of breath.  Occlusion and stenosis of carotid artery without mention of cerebral infarction Moderate left ICA stenosis  by duplex ultrasound August 2020.  We will repeat carotid Doppler studies.      Lorretta Harp MD FACP,FACC,FAHA, [facility] 02/21/2020 10:28 AM

## 2020-02-21 NOTE — Patient Instructions (Signed)

## 2020-02-21 NOTE — Assessment & Plan Note (Signed)
History of peripheral arterial disease with angiography that I performed 01/17/2012 demonstrating occluded SFAs bilaterally with two-vessel runoff on the right, 1 on the left via peroneal artery and a 95% calcified ostial profunda femoris on the right jeopardizing collaterals.  I did refer her to Dr. [name] for consideration of surgical revascularization however he felt that she should have conservative/medical therapy.

## 2020-02-21 NOTE — Assessment & Plan Note (Addendum)
History of hyperlipidemia on high-dose statin therapy and Repatha with lipid profile performed 07/18/2019 revealing total cholesterol 111, LDL 25 and HDL of 53.

## 2020-02-21 NOTE — Assessment & Plan Note (Addendum)
History of CAD status post emergent CABG in [facility] 09/27/2010 after witnessed cardiac arrest, see and CPR.  She did have a prolonged hospitalization there.  Ultimately her EF was shown to be normal.  Her last Myoview performed 02/12/2013 was low risk and nonischemic.  She denies chest pain or shortness of breath.

## 2020-02-21 NOTE — Assessment & Plan Note (Signed)
History of essential hypertension with blood pressure measured today 120/80.  She is on Lopressor and Cozaar.

## 2020-02-24 DIAGNOSIS — Z0000 Encounter for general adult medical examination without abnormal findings: Secondary | ICD-10-CM | POA: Diagnosis not present

## 2020-02-24 DIAGNOSIS — E1169 Type 2 diabetes mellitus with other specified complication: Secondary | ICD-10-CM | POA: Diagnosis not present

## 2020-03-08 ENCOUNTER — Other Ambulatory Visit: Payer: Self-pay | Admitting: Cardiovascular Disease

## 2020-03-25 ENCOUNTER — Other Ambulatory Visit: Payer: Self-pay | Admitting: Cardiovascular Disease

## 2020-03-31 ENCOUNTER — Ambulatory Visit
Admission: RE | Admit: 2020-03-31 | Discharge: 2020-03-31 | Disposition: A | Discharge status: Home / Self Care | Payer: PPO | Source: Ambulatory Visit | Attending: Internal Medicine | Admitting: Internal Medicine

## 2020-03-31 ENCOUNTER — Other Ambulatory Visit: Payer: Self-pay | Admitting: Internal Medicine

## 2020-03-31 DIAGNOSIS — G4733 Obstructive sleep apnea (adult) (pediatric): Secondary | ICD-10-CM | POA: Diagnosis not present

## 2020-03-31 DIAGNOSIS — R059 Cough, unspecified: Secondary | ICD-10-CM

## 2020-03-31 DIAGNOSIS — R05 Cough: Secondary | ICD-10-CM | POA: Diagnosis not present

## 2020-04-13 DIAGNOSIS — I2510 Atherosclerotic heart disease of native coronary artery without angina pectoris: Secondary | ICD-10-CM | POA: Diagnosis not present

## 2020-04-13 DIAGNOSIS — I10 Essential (primary) hypertension: Secondary | ICD-10-CM | POA: Diagnosis not present

## 2020-04-13 DIAGNOSIS — E1169 Type 2 diabetes mellitus with other specified complication: Secondary | ICD-10-CM | POA: Diagnosis not present

## 2020-04-13 DIAGNOSIS — E11319 Type 2 diabetes mellitus with unspecified diabetic retinopathy without macular edema: Secondary | ICD-10-CM | POA: Diagnosis not present

## 2020-04-13 DIAGNOSIS — E785 Hyperlipidemia, unspecified: Secondary | ICD-10-CM | POA: Diagnosis not present

## 2020-04-13 DIAGNOSIS — Z1239 Encounter for other screening for malignant neoplasm of breast: Secondary | ICD-10-CM | POA: Diagnosis not present

## 2020-04-13 DIAGNOSIS — I739 Peripheral vascular disease, unspecified: Secondary | ICD-10-CM | POA: Diagnosis not present

## 2020-04-13 DIAGNOSIS — R05 Cough: Secondary | ICD-10-CM | POA: Diagnosis not present

## 2020-04-14 ENCOUNTER — Other Ambulatory Visit: Payer: Self-pay | Admitting: Family Medicine

## 2020-04-14 DIAGNOSIS — Z1231 Encounter for screening mammogram for malignant neoplasm of breast: Secondary | ICD-10-CM

## 2020-04-14 DIAGNOSIS — G4733 Obstructive sleep apnea (adult) (pediatric): Secondary | ICD-10-CM | POA: Diagnosis not present

## 2020-04-16 ENCOUNTER — Other Ambulatory Visit: Payer: Self-pay | Admitting: Cardiovascular Disease

## 2020-04-22 ENCOUNTER — Encounter (INDEPENDENT_AMBULATORY_CARE_PROVIDER_SITE_OTHER): Payer: PPO | Admitting: Ophthalmology

## 2020-04-22 ENCOUNTER — Other Ambulatory Visit: Payer: Self-pay

## 2020-04-22 DIAGNOSIS — H43813 Vitreous degeneration, bilateral: Secondary | ICD-10-CM

## 2020-04-22 DIAGNOSIS — E11311 Type 2 diabetes mellitus with unspecified diabetic retinopathy with macular edema: Secondary | ICD-10-CM

## 2020-04-22 DIAGNOSIS — E113291 Type 2 diabetes mellitus with mild nonproliferative diabetic retinopathy without macular edema, right eye: Secondary | ICD-10-CM | POA: Diagnosis not present

## 2020-04-22 DIAGNOSIS — E113312 Type 2 diabetes mellitus with moderate nonproliferative diabetic retinopathy with macular edema, left eye: Secondary | ICD-10-CM | POA: Diagnosis not present

## 2020-04-22 DIAGNOSIS — H35033 Hypertensive retinopathy, bilateral: Secondary | ICD-10-CM | POA: Diagnosis not present

## 2020-04-22 DIAGNOSIS — H2513 Age-related nuclear cataract, bilateral: Secondary | ICD-10-CM

## 2020-04-22 DIAGNOSIS — I10 Essential (primary) hypertension: Secondary | ICD-10-CM

## 2020-05-14 ENCOUNTER — Other Ambulatory Visit: Payer: Self-pay | Admitting: Cardiovascular Disease

## 2020-05-18 DIAGNOSIS — K047 Periapical abscess without sinus: Secondary | ICD-10-CM | POA: Diagnosis not present

## 2020-05-20 ENCOUNTER — Ambulatory Visit (HOSPITAL_COMMUNITY): Payer: PPO

## 2020-05-21 ENCOUNTER — Ambulatory Visit: Payer: PPO

## 2020-05-28 ENCOUNTER — Other Ambulatory Visit: Payer: Self-pay

## 2020-05-28 ENCOUNTER — Encounter: Payer: Self-pay | Admitting: Emergency Medicine

## 2020-05-28 ENCOUNTER — Ambulatory Visit: Payer: PPO | Admitting: Emergency Medicine

## 2020-05-28 DIAGNOSIS — R05 Cough: Secondary | ICD-10-CM | POA: Diagnosis not present

## 2020-05-28 DIAGNOSIS — R053 Chronic cough: Secondary | ICD-10-CM

## 2020-05-28 MED ORDER — PANTOPRAZOLE SODIUM 40 MG PO TBEC: 40.0000 mg | DELAYED_RELEASE_TABLET | Freq: Every day | ORAL | 2 refills | Status: DC

## 2020-05-28 MED ORDER — IRBESARTAN 150 MG PO TABS: 150.0000 mg | ORAL_TABLET | Freq: Every day | ORAL | 2 refills | Status: DC

## 2020-05-28 MED ORDER — LORATADINE 10 MG PO TABS: 10.0000 mg | ORAL_TABLET | Freq: Every day | ORAL | 11 refills | Status: DC

## 2020-05-28 NOTE — Assessment & Plan Note (Signed)
Chronic dry cough, sometimes paroxysmal with posttussive emesis.  Based on the timing of onset it certainly sounds like it related to her critical care illness and intubation.  She did not have a tracheostomy.  She does not have dyspnea.  Question whether she may have some posterior pharyngeal weakness or microaspiration.  She denies dysphagia or any overt aspiration symptoms.  If she had tracheal stenosis I would think she would be short of breath.  I think will be reasonable to try and modify any factors that may be sustaining cough: -Possible GERD, impact of fish oil -She has low level allergy and rhinitis which may be a contributor -Sometimes losartan can mimic the effects of ACE inhibitors more than any of the other ARB -Possible obstructive lung disease given her tobacco history  Temporarily stop losartan We will start irbesartan 150 mg once daily for now. Please start pantoprazole 40 mg once daily until next visit.  Take this medication 1 hour around food. Please start loratadine 10 mg once daily until next visit Stop your fish oil Follow with Dr. Lamonte Sakai next available with full pulmonary function testing on the same day.  Depending on how your cough evolves we may decide to perform bronchoscopy to visualize your airways.

## 2020-05-28 NOTE — Progress Notes (Signed)
Subjective:    Patient ID: Alicia Crawford, female    DOB: 04-06-1956, 64 y.o.   MRN: [id number]  HPI 65 year old former smoker (30 pack years) with a history of coronary disease, MI and CABG 2376 complicated by cardiac arrest with prolonged critical care hospitalization and ventilation.  Also history of hypertension, DVT, hyperlipidemia, PVD, diabetes, OSA on CPAP.  She is referred today for evaluation of coughing.  She reports longstanding cough ever since her CC hospitalization 2012. Was worst at night initially, now happens at any time. Can still wake her from sleep. Now she deals with paroxysms, sometimes resulting in emesis. She has been on cough suppressants over the years without much response. Seems to be better with lozenges, with drinking water these days. She feels tickle in her throat. Cough is never productive. No overt aspiration sx. No dyspnea. Some nasal congestion, not severe. Never has GERD sx. No difficulty with pills. Does not seem to affect her voice.   She is on losartan, omega-3 fish oil  Chest x-ray done 03/31/2020 reviewed by me, shows normal heart size, prior thoracic surgery, no infiltrates or effusions.   Review of Systems As per HPI  Past Medical History:  Diagnosis Date  . Ankle fracture 09/05/2016   right ankle  . Anoxic brain injury ([place]) 08/2010   "w/cardiac arrest"  . Anxiety Jan. 2012  . Blood transfusion 09/2010  . Cardiac arrest ([place]) 08/04/2010  . Carotid artery occlusion    mild to moderate bilateral internal carotid artery stenosis  . Claudication ([place])   . Complication of anesthesia    [place]  . Coronary artery disease    CABG 2011 in [place]  . Coronary artery disease   . Dementia   . Depression   . DVT (deep venous thrombosis) ([place])   . GERD (gastroesophageal reflux disease)   . High cholesterol   . Hypertension   . Myocardial infarction [facility]) 09/27/10   in [place]  . Obesity   . Peripheral vascular disease (HCC)     bilat SFA diseae  . Renal artery stenosis ([place])   . Rotator cuff tendonitis   . Sleep apnea    uses CPAP nightly  . Stroke [facility]) 2011  . Subdural hematoma [facility]) May 25,2013   Frontal subdural  S/P evacuation of hematoma  . Type II diabetes mellitus (HCC)    Diet and Exercise     Family History  Problem Relation Age of Onset  . Heart disease Mother        before age 54  . Diabetes Mother   . Hyperlipidemia Mother   . Hypertension Mother   . Heart attack Mother   . Heart disease Father        Before age 26  . Hyperlipidemia Father   . Hypertension Father   . Heart attack Father   . Heart disease Sister        Before age 21  . Heart attack Sister   . Hyperlipidemia Brother   . Hypertension Brother   . Heart attack Brother   . Heart disease Brother        before age 62  . Heart disease Other   . Diabetes Other   . Hypertension Other   . Alcohol abuse Other   . Mental illness Other      Social History   Socioeconomic History  . Marital status: Married    Spouse name: Not on file  . Number of children: Not  on file  . Years of education: Not on file  . Highest education level: Not on file  Occupational History  . Not on file  Tobacco Use  . Smoking status: Former Smoker    Packs/day: 1.00    Years: 30.00    Pack years: 30.00    Types: Cigarettes    Quit date: 06/23/2003    Years since quitting: 16.9  . Smokeless tobacco: Never Used  Substance and Sexual Activity  . Alcohol use: Not Currently    Alcohol/week: 0.0 standard drinks    Comment: 01/17/12 "used to have glass of wine now & then; last wine was ~ 2011"  . Drug use: No  . Sexual activity: Yes  Other Topics Concern  . Not on file  Social History Narrative  . Not on file   Social Determinants of Health   Financial Resource Strain:   . Difficulty of Paying Living Expenses: Not on file  Food Insecurity:   . Worried About Charity fundraiser in the Last Year: Not on file  . Ran Out of Food in the  Last Year: Not on file  Transportation Needs:   . Lack of Transportation (Medical): Not on file  . Lack of Transportation (Non-Medical): Not on file  Physical Activity:   . Days of Exercise per Week: Not on file  . Minutes of Exercise per Session: Not on file  Stress:   . Feeling of Stress : Not on file  Social Connections:   . Frequency of Communication with Friends and Family: Not on file  . Frequency of Social Gatherings with Friends and Family: Not on file  . Attends Religious Services: Not on file  . Active Member of Clubs or Organizations: Not on file  . Attends Archivist Meetings: Not on file  . Marital Status: Not on file  Intimate Partner Violence:   . Fear of Current or Ex-Partner: Not on file  . Emotionally Abused: Not on file  . Physically Abused: Not on file  . Sexually Abused: Not on file    From [place], moved to [place] Has worked office work for [employer], Designer, television/film set co No inhaled exposures   Allergies  Allergen Reactions  . Halothane Hives and Other (See Comments)    Gas used 1980's Reaction: affected her liver     Outpatient Medications Prior to Visit  Medication Sig Dispense Refill  . Ascorbic Acid (VITAMIN C) 100 MG tablet Take 100 mg by mouth daily.    [name] aspirin EC 81 MG tablet Take 81 mg by mouth daily.    [name] atorvastatin (LIPITOR) 80 MG tablet TAKE 1 TABLET BY MOUTH DAILY 90 tablet 3  . cholecalciferol (VITAMIN D3) 25 MCG (1000 UNIT) tablet Take 1,000 Units by mouth daily.    . cilostazol (PLETAL) 100 MG tablet TAKE 1 TABLET BY MOUTH TWICE DAILY BEFORE MEALS 180 tablet 3  . Coenzyme Q10 (EQL COQ10) 300 MG CAPS Take 1 capsule by mouth daily.    [name] losartan (COZAAR) 50 MG tablet TAKE 1 TABLET(50 MG) BY MOUTH DAILY 90 tablet 3  . MELATONIN PO Take by mouth.    . metFORMIN (GLUCOPHAGE) 1000 MG tablet Take 1,000 mg by mouth daily.   0  . metoprolol tartrate (LOPRESSOR) 25 MG tablet TAKE 1 TABLET(25 MG) BY MOUTH TWICE DAILY 180 tablet 0  . Multiple Vitamin  (MULITIVITAMIN WITH MINERALS) TABS Take 1 tablet by mouth daily.    . Multiple Vitamins-Minerals (ZINC PO) Take by mouth.    [name]  omega-3 fish oil (MAXEPA) 1000 MG CAPS capsule Take 1 capsule by mouth daily. Mega Red    . REPATHA SURECLICK 940 MG/ML SOAJ ADMINISTER 1 ML UNDER THE SKIN EVERY 14 DAYS 2 mL 12  . sertraline (ZOLOFT) 100 MG tablet Take 1.5 tablets (150 mg total) by mouth daily. 180 tablet 2   No facility-administered medications prior to visit.         Objective:   Physical Exam Vitals:   05/28/20 1053  BP: 122/72  Pulse: 90  Temp: 97.7 F (36.5 C)  TempSrc: Temporal  SpO2: 98%  Weight: 188 lb 6.4 oz (85.5 kg)  Height: 5\' 4"  (1.626 m)   Gen: Pleasant, well-nourished, in no distress,  normal affect  ENT: No lesions,  mouth clear,  oropharynx clear, no postnasal drip  Neck: No JVD, no stridor  Lungs: No use of accessory muscles, no crackles or wheezing on normal respiration, no wheeze on forced expiration  Cardiovascular: RRR, heart sounds normal, no murmur or gallops, no peripheral edema  Musculoskeletal: No deformities, no cyanosis or clubbing  Neuro: alert, awake, non focal  Skin: Warm, no lesions or rash     Assessment & Plan:  Chronic cough Chronic dry cough, sometimes paroxysmal with posttussive emesis.  Based on the timing of onset it certainly sounds like it related to her critical care illness and intubation.  She did not have a tracheostomy.  She does not have dyspnea.  Question whether she may have some posterior pharyngeal weakness or microaspiration.  She denies dysphagia or any overt aspiration symptoms.  If she had tracheal stenosis I would think she would be short of breath.  I think will be reasonable to try and modify any factors that may be sustaining cough: -Possible GERD, impact of fish oil -She has low level allergy and rhinitis which may be a contributor -Sometimes losartan can mimic the effects of ACE inhibitors more than any of the other  ARB -Possible obstructive lung disease given her tobacco history  Temporarily stop losartan We will start irbesartan 150 mg once daily for now. Please start pantoprazole 40 mg once daily until next visit.  Take this medication 1 hour around food. Please start loratadine 10 mg once daily until next visit Stop your fish oil Follow with Dr. Lamonte Sakai next available with full pulmonary function testing on the same day.  Depending on how your cough evolves we may decide to perform bronchoscopy to visualize your airways.  Baltazar Apo, MD, PhD 05/28/2020, 11:33 AM [facility] [phone] or if no answer [phone]

## 2020-05-28 NOTE — Addendum Note (Signed)
Addended by: Gavin Potters R on: 05/28/2020 11:39 AM   Modules accepted: Orders

## 2020-05-28 NOTE — Patient Instructions (Addendum)
Temporarily stop losartan We will start irbesartan 150 mg once daily for now. Please start pantoprazole 40 mg once daily until next visit.  Take this medication 1 hour around food. Please start loratadine 10 mg once daily until next visit Stop your fish oil Follow with Dr. Lamonte Sakai next available with full pulmonary function testing on the same day.  Depending on how your cough evolves we may decide to perform bronchoscopy to visualize your airways.

## 2020-05-29 ENCOUNTER — Ambulatory Visit (HOSPITAL_COMMUNITY)
Admission: RE | Admit: 2020-05-29 | Payer: PPO | Source: Ambulatory Visit | Attending: Cardiovascular Disease | Admitting: Cardiovascular Disease

## 2020-05-29 NOTE — Telephone Encounter (Signed)
RB FYI follow-up from yesterday's OV:  Just a note to tell you that last night during my coughing spell there is and I guess always has been some flem coming up.  I guess I had forgotten to tell you that

## 2020-06-02 ENCOUNTER — Other Ambulatory Visit: Payer: Self-pay

## 2020-06-02 ENCOUNTER — Other Ambulatory Visit (HOSPITAL_COMMUNITY): Payer: Self-pay | Admitting: Cardiovascular Disease

## 2020-06-02 ENCOUNTER — Ambulatory Visit (HOSPITAL_COMMUNITY)
Admission: RE | Admit: 2020-06-02 | Discharge: 2020-06-02 | Disposition: A | Discharge status: Home / Self Care | Payer: PPO | Source: Ambulatory Visit | Attending: Internal Medicine | Admitting: Internal Medicine

## 2020-06-02 DIAGNOSIS — I6523 Occlusion and stenosis of bilateral carotid arteries: Secondary | ICD-10-CM

## 2020-06-02 DIAGNOSIS — F325 Major depressive disorder, single episode, in full remission: Secondary | ICD-10-CM

## 2020-06-02 MED ORDER — SERTRALINE HCL 100 MG PO TABS: 150.0000 mg | ORAL_TABLET | Freq: Every day | ORAL | 2 refills | Status: DC

## 2020-06-10 ENCOUNTER — Other Ambulatory Visit: Payer: Self-pay

## 2020-06-10 DIAGNOSIS — I6523 Occlusion and stenosis of bilateral carotid arteries: Secondary | ICD-10-CM

## 2020-06-10 NOTE — Progress Notes (Signed)
vas 

## 2020-06-16 ENCOUNTER — Other Ambulatory Visit: Payer: Self-pay

## 2020-06-16 ENCOUNTER — Ambulatory Visit
Admission: RE | Admit: 2020-06-16 | Discharge: 2020-06-16 | Disposition: A | Discharge status: Home / Self Care | Payer: PPO | Source: Ambulatory Visit | Attending: Family Medicine | Admitting: Family Medicine

## 2020-06-16 DIAGNOSIS — Z1231 Encounter for screening mammogram for malignant neoplasm of breast: Secondary | ICD-10-CM | POA: Diagnosis not present

## 2020-06-26 DIAGNOSIS — E1169 Type 2 diabetes mellitus with other specified complication: Secondary | ICD-10-CM | POA: Diagnosis not present

## 2020-06-26 DIAGNOSIS — D649 Anemia, unspecified: Secondary | ICD-10-CM | POA: Diagnosis not present

## 2020-06-26 DIAGNOSIS — E785 Hyperlipidemia, unspecified: Secondary | ICD-10-CM | POA: Diagnosis not present

## 2020-06-26 DIAGNOSIS — R05 Cough: Secondary | ICD-10-CM | POA: Diagnosis not present

## 2020-06-26 DIAGNOSIS — I2510 Atherosclerotic heart disease of native coronary artery without angina pectoris: Secondary | ICD-10-CM | POA: Diagnosis not present

## 2020-07-10 ENCOUNTER — Other Ambulatory Visit (HOSPITAL_COMMUNITY): Payer: PPO

## 2020-07-14 ENCOUNTER — Ambulatory Visit: Payer: PPO | Admitting: Emergency Medicine

## 2020-08-03 ENCOUNTER — Other Ambulatory Visit (HOSPITAL_COMMUNITY): Payer: PPO

## 2020-08-05 ENCOUNTER — Encounter (INDEPENDENT_AMBULATORY_CARE_PROVIDER_SITE_OTHER): Payer: PPO | Admitting: Ophthalmology

## 2020-08-06 ENCOUNTER — Ambulatory Visit: Payer: PPO | Admitting: Emergency Medicine

## 2020-08-11 ENCOUNTER — Other Ambulatory Visit: Payer: Self-pay | Admitting: Cardiovascular Disease

## 2020-08-17 ENCOUNTER — Other Ambulatory Visit (HOSPITAL_COMMUNITY)
Admission: RE | Admit: 2020-08-17 | Discharge: 2020-08-17 | Disposition: A | Discharge status: Home / Self Care | Payer: PPO | Source: Ambulatory Visit | Attending: Emergency Medicine | Admitting: Emergency Medicine

## 2020-08-17 DIAGNOSIS — Z01812 Encounter for preprocedural laboratory examination: Secondary | ICD-10-CM | POA: Diagnosis not present

## 2020-08-17 DIAGNOSIS — Z20822 Contact with and (suspected) exposure to covid-19: Secondary | ICD-10-CM | POA: Insufficient documentation

## 2020-08-17 LAB — SARS CORONAVIRUS 2 (TAT 6-24 HRS): SARS Coronavirus 2: NEGATIVE

## 2020-08-20 ENCOUNTER — Other Ambulatory Visit: Payer: Self-pay

## 2020-08-20 ENCOUNTER — Ambulatory Visit: Payer: PPO | Admitting: Pulmonary Disease

## 2020-08-20 ENCOUNTER — Encounter: Payer: Self-pay | Admitting: Pulmonary Disease

## 2020-08-20 ENCOUNTER — Ambulatory Visit: Payer: PPO | Admitting: Emergency Medicine

## 2020-08-20 ENCOUNTER — Ambulatory Visit (INDEPENDENT_AMBULATORY_CARE_PROVIDER_SITE_OTHER): Payer: PPO | Admitting: Emergency Medicine

## 2020-08-20 VITALS — BP 122/74 | HR 97 | Temp 97.2°F | Ht 64.0 in | Wt 188.0 lb

## 2020-08-20 DIAGNOSIS — Z0000 Encounter for general adult medical examination without abnormal findings: Secondary | ICD-10-CM

## 2020-08-20 DIAGNOSIS — R053 Chronic cough: Secondary | ICD-10-CM | POA: Diagnosis not present

## 2020-08-20 DIAGNOSIS — Z23 Encounter for immunization: Secondary | ICD-10-CM | POA: Diagnosis not present

## 2020-08-20 DIAGNOSIS — G4733 Obstructive sleep apnea (adult) (pediatric): Secondary | ICD-10-CM | POA: Diagnosis not present

## 2020-08-20 LAB — PULMONARY FUNCTION TEST
DL/VA % pred: 104 %
DL/VA: 4.34 ml/min/mmHg/L
DLCO cor % pred: 93 %
DLCO cor: 18.5 ml/min/mmHg
DLCO unc % pred: 93 %
DLCO unc: 18.5 ml/min/mmHg
FEF 25-75 Post: 3.69 L/sec
FEF 25-75 Pre: 2.99 L/sec
FEF2575-%Change-Post: 23 %
FEF2575-%Pred-Post: 176 %
FEF2575-%Pred-Pre: 143 %
FEV1-%Change-Post: 4 %
FEV1-%Pred-Post: 110 %
FEV1-%Pred-Pre: 105 %
FEV1-Post: 2.64 L
FEV1-Pre: 2.53 L
FEV1FVC-%Change-Post: 1 %
FEV1FVC-%Pred-Pre: 111 %
FEV6-%Change-Post: 3 %
FEV6-%Pred-Post: 100 %
FEV6-%Pred-Pre: 97 %
FEV6-Post: 3.03 L
FEV6-Pre: 2.94 L
FEV6FVC-%Pred-Post: 104 %
FEV6FVC-%Pred-Pre: 104 %
FVC-%Change-Post: 2 %
FVC-%Pred-Post: 96 %
FVC-%Pred-Pre: 94 %
FVC-Post: 3.04 L
FVC-Pre: 2.95 L
Post FEV1/FVC ratio: 87 %
Post FEV6/FVC ratio: 100 %
Pre FEV1/FVC ratio: 86 %
Pre FEV6/FVC Ratio: 100 %
RV % pred: 49 %
RV: 1.04 L
TLC % pred: 95 %
TLC: 4.82 L

## 2020-08-20 MED ORDER — CHLORPHENIRAMINE MALEATE 4 MG PO TABS: ORAL_TABLET | ORAL | 3 refills | Status: DC

## 2020-08-20 MED ORDER — FLUTICASONE PROPIONATE 50 MCG/ACT NA SUSP: 1.0000 | Freq: Every day | NASAL | 3 refills | Status: DC

## 2020-08-20 NOTE — Progress Notes (Signed)
@Patient  ID: Alicia Crawford, female    DOB: 12-Oct-1955, 64 y.o.   MRN: [id number]  Chief Complaint  Patient presents with  . Follow-up    Chronic cough w/PFT, still coughing at night but getting better    Referring provider: Eunice Blase, MD  HPI:  64 year old female former smoker initially referred to our office in August/2021 for chronic cough  PMH: Hypertension, history of VTE-DVT, diabetes, obstructive sleep apnea on CPAP, hyperlipidemia Smoker/ Smoking History: Former smoker.  Quit 2004.  30-pack-year smoking history. Maintenance: None Pt of: Dr. Lamonte Sakai  08/20/2020  - Visit   64 year old female former smoker initially consulted with our practice in August/2021 with Dr. Lamonte Sakai for chronic cough.  Patient is a former smoker with a over 30 pack years of smoking.  Also has a history of hypertension, history of DVT, hyperlipidemia, PVD, diabetes, and obstructive sleep apnea managed on CPAP.  Plan of care from that office visit was to stop losartan, start irbesartan, start PPI, start antihistamine, stop fish oil, obtain pulmonary function testing.  Patient completed pulmonary function testing today.  Those results listed below:  08/20/2020-pulmonary function test-FVC 2.95 (94% predicted), postbronchodilator ratio 87, postbronchodilator FEV1 2.64 (110% predicted), no bronchodilator response, TLC 4.82 (95% predicted), DLCO 18.5 (93% predicted)  Patient feels her cough has improved since starting PPI, antihistamine and stopping fish oil.  She still has a cough more later in the evening.  This is causing her to not be adherent to using her CPAP.  We will discuss this today.  Patient would like the seasonal flu vaccine today.  Questionaires / Pulmonary Flowsheets:   ACT:  No flowsheet data found.  MMRC: No flowsheet data found.  [place]:  No flowsheet data found.  Tests:   08/20/2020-pulmonary function test-FVC 2.95 (94% predicted), postbronchodilator ratio 87,  postbronchodilator FEV1 2.64 (110% predicted), no bronchodilator response, TLC 4.82 (95% predicted), DLCO 18.5 (93% predicted)   FENO:  No results found for: NITRICOXIDE  PFT: PFT Results Latest Ref Rng & Units 08/20/2020  FVC-Pre L 2.95  FVC-Predicted Pre % 94  FVC-Post L 3.04  FVC-Predicted Post % 96  Pre FEV1/FVC % % 86  Post FEV1/FCV % % 87  FEV1-Pre L 2.53  FEV1-Predicted Pre % 105  FEV1-Post L 2.64  DLCO uncorrected ml/min/mmHg 18.50  DLCO UNC% % 93  DLCO corrected ml/min/mmHg 18.50  DLCO COR %Predicted % 93  DLVA Predicted % 104  TLC L 4.82  TLC % Predicted % 95  RV % Predicted % 49    WALK:  No flowsheet data found.  Imaging: No results found.  Lab Results:  CBC    Component Value Date/Time   WBC 7.2 07/06/2012 1148   RBC 4.58 07/06/2012 1148   HGB 13.6 07/06/2012 1148   HCT 40.2 07/06/2012 1148   PLT 255 07/06/2012 1148   MCV 87.8 07/06/2012 1148   MCH 29.7 07/06/2012 1148   MCHC 33.8 07/06/2012 1148   RDW 14.8 07/06/2012 1148   LYMPHSABS 2.5 03/02/2012 0635   MONOABS 0.8 03/02/2012 0635   EOSABS 0.1 03/02/2012 0635   BASOSABS 0.0 03/02/2012 0635    BMET    Component Value Date/Time   NA 140 04/22/2019 1227   K 4.5 04/22/2019 1227   CL 106 04/22/2019 1227   CO2 25 04/22/2019 1227   GLUCOSE 105 (H) 04/22/2019 1227   BUN 25 (H) 04/22/2019 1227   CREATININE 1.12 (H) 04/22/2019 1227   CALCIUM 9.3 04/22/2019 1227   GFRNONAA 52 (  L) 04/22/2019 1227   GFRAA 60 (L) 04/22/2019 1227    BNP No results found for: BNP  ProBNP    Component Value Date/Time   PROBNP 1,016.0 (H) 02/27/2012 1250    Specialty Problems      Pulmonary Problems   Chronic cough   OSA (obstructive sleep apnea)      Allergies  Allergen Reactions  . Halothane Hives and Other (See Comments)    Gas used 1980's Reaction: affected her liver    Immunization History  Administered Date(s) Administered  . Influenza, High Dose Seasonal PF 08/20/2020  .  Influenza,inj,Quad PF,6+ Mos 06/21/2018  . Influenza-Unspecified 06/22/2014  . Pneumococcal-Unspecified 11/04/2011  . Zoster 06/12/2014  . Zoster Recombinat (Shingrix) 06/21/2018, 08/22/2018    Past Medical History:  Diagnosis Date  . Ankle fracture 09/05/2016   right ankle  . Anoxic brain injury ([place]) 08/2010   "w/cardiac arrest"  . Anxiety Jan. 2012  . Blood transfusion 09/2010  . Cardiac arrest ([place]) 08/04/2010  . Carotid artery occlusion    mild to moderate bilateral internal carotid artery stenosis  . Claudication ([place])   . Complication of anesthesia    [place]  . Coronary artery disease    CABG 2011 in [place]  . Coronary artery disease   . Dementia   . Depression   . DVT (deep venous thrombosis) ([place])   . GERD (gastroesophageal reflux disease)   . High cholesterol   . Hypertension   . Myocardial infarction [facility]) 09/27/10   in [place]  . Obesity   . Peripheral vascular disease (HCC)    bilat SFA diseae  . Renal artery stenosis ([place])   . Rotator cuff tendonitis   . Sleep apnea    uses CPAP nightly  . Stroke [facility]) 2011  . Subdural hematoma [facility]) May 25,2013   Frontal subdural  S/P evacuation of hematoma  . Type II diabetes mellitus (HCC)    Diet and Exercise    Tobacco History: Social History   Tobacco Use  Smoking Status Former Smoker  . Packs/day: 1.00  . Years: 30.00  . Pack years: 30.00  . Types: Cigarettes  . Quit date: 06/23/2003  . Years since quitting: 17.1  Smokeless Tobacco Never Used   Counseling given: Not Answered   Continue to not smoke  Outpatient Encounter Medications as of 08/20/2020  Medication Sig  . Ascorbic Acid (VITAMIN C) 100 MG tablet Take 100 mg by mouth daily.  [name] aspirin EC 81 MG tablet Take 81 mg by mouth daily.  [name] atorvastatin (LIPITOR) 80 MG tablet TAKE 1 TABLET BY MOUTH DAILY  . cholecalciferol (VITAMIN D3) 25 MCG (1000 UNIT) tablet Take 1,000 Units by mouth daily.  . cilostazol (PLETAL) 100 MG tablet  TAKE 1 TABLET BY MOUTH TWICE DAILY BEFORE MEALS  . Coenzyme Q10 (EQL COQ10) 300 MG CAPS Take 1 capsule by mouth daily.  . irbesartan (AVAPRO) 150 MG tablet Take 1 tablet (150 mg total) by mouth daily.  [name] loratadine (CLARITIN) 10 MG tablet Take 1 tablet (10 mg total) by mouth daily.  [name] losartan (COZAAR) 50 MG tablet TAKE 1 TABLET(50 MG) BY MOUTH DAILY  . MELATONIN PO Take by mouth.  . metFORMIN (GLUCOPHAGE) 1000 MG tablet Take 1,000 mg by mouth daily.   . metoprolol tartrate (LOPRESSOR) 25 MG tablet TAKE 1 TABLET(25 MG) BY MOUTH TWICE DAILY  . Multiple Vitamin (MULITIVITAMIN WITH MINERALS) TABS Take 1 tablet by mouth daily.  . Multiple Vitamins-Minerals (ZINC PO) Take  by mouth.  . omega-3 fish oil (MAXEPA) 1000 MG CAPS capsule Take 1 capsule by mouth daily. Mega Red  . pantoprazole (PROTONIX) 40 MG tablet Take 1 tablet (40 mg total) by mouth daily.  [name] REPATHA SURECLICK 865 MG/ML SOAJ ADMINISTER 1 ML UNDER THE SKIN EVERY 14 DAYS  . sertraline (ZOLOFT) 100 MG tablet Take 1.5 tablets (150 mg total) by mouth daily.  . chlorpheniramine (CHLOR-TRIMETON) 4 MG tablet Chlorpheniramine (aka Chlor tabs)4 mg tablet (1 to 2 tablets at night) for management of allergies and postnasal drip at night  . fluticasone (FLONASE) 50 MCG/ACT nasal spray Place 1 spray into both nostrils daily.   No facility-administered encounter medications on file as of 08/20/2020.     Review of Systems  Review of Systems  Constitutional: Negative for activity change, fatigue and fever.  HENT: Positive for congestion, postnasal drip and rhinorrhea. Negative for sinus pressure, sinus pain and sore throat.   Respiratory: Positive for cough. Negative for shortness of breath and wheezing.   Cardiovascular: Negative for chest pain and palpitations.  Gastrointestinal: Negative for diarrhea, nausea and vomiting.  Musculoskeletal: Negative for arthralgias.  Neurological: Negative for dizziness.  Psychiatric/Behavioral: Negative for  sleep disturbance. The patient is not nervous/anxious.      Physical Exam  BP 122/74 (BP Location: Left Arm, Cuff Size: Normal)   Pulse 97   Temp (!) 97.2 F (36.2 C) (Oral)   Ht 5\' 4"  (1.626 m)   Wt 188 lb (85.3 kg)   SpO2 100%   BMI 32.27 kg/m   Wt Readings from Last 5 Encounters:  08/20/20 188 lb (85.3 kg)  05/28/20 188 lb 6.4 oz (85.5 kg)  02/21/20 195 lb (88.5 kg)  04/25/19 196 lb 3.4 oz (89 kg)  02/19/19 195 lb (88.5 kg)    BMI Readings from Last 5 Encounters:  08/20/20 32.27 kg/m  05/28/20 32.34 kg/m  02/21/20 33.47 kg/m  04/25/19 33.68 kg/m  02/19/19 33.47 kg/m     Physical Exam Vitals and nursing note reviewed.  Constitutional:      General: She is not in acute distress.    Appearance: Normal appearance. She is obese.  HENT:     Head: Normocephalic and atraumatic.     Right Ear: Tympanic membrane, ear canal and external ear normal. There is no impacted cerumen.     Left Ear: Tympanic membrane, ear canal and external ear normal. There is no impacted cerumen.     Nose: Rhinorrhea present. No congestion.     Mouth/Throat:     Mouth: Mucous membranes are moist.     Pharynx: Oropharynx is clear.     Comments: +PND Eyes:     Pupils: Pupils are equal, round, and reactive to light.  Cardiovascular:     Rate and Rhythm: Normal rate and regular rhythm.     Pulses: Normal pulses.     Heart sounds: Normal heart sounds. No murmur heard.   Pulmonary:     Effort: Pulmonary effort is normal. No respiratory distress.     Breath sounds: Normal breath sounds. No decreased air movement. No decreased breath sounds, wheezing or rales.  Musculoskeletal:     Cervical back: Normal range of motion.  Skin:    General: Skin is warm and dry.     Capillary Refill: Capillary refill takes less than 2 seconds.  Neurological:     General: No focal deficit present.     Mental Status: She is alert and oriented to person, place, and time.  Mental status is at baseline.      Gait: Gait normal.  Psychiatric:        Mood and Affect: Mood normal.        Behavior: Behavior normal.        Thought Content: Thought content normal.        Judgment: Judgment normal.       Assessment & Plan:   OSA (obstructive sleep apnea) Unable to obtain compliance report today Patient reports this was previously managed by PCP Patient struggling with compliance due to chronic cough  Plan:  encourage patient to resume CPAP use Please bring CPAP to next appointment so we can obtain a download  Chronic cough Improvement in cough since starting daily antihistamine, PPI, stopping losartan.  Patient still having a persistent cough especially after eating.  Cough is occasionally productive with clear phlegm. Patient denies any sort of symptoms of acid reflux.  Patient with rhinorrhea and postnasal drip on exam  Plan: Continue loratadine Continue Protonix Elevate head of bed Start chlor tabs at night Can start Flonase 48-month follow-up with our office   Healthcare maintenance Patient unvaccinated [id number]-44, she has vaccine hesitancy Offered to try to help answer questions for COVID-19 vaccine, patient was unable to voice for her concerns were  Plan: Recommend COVID-19 vaccination, patient is aware that she is high risk for complications should she contract COVID-19 given the fact that her BMI is over 70, age over 18, has type 2 diabetes High-dose seasonal flu vaccine today    Return in about 3 months (around 11/20/2020), or if symptoms worsen or fail to improve, for Follow up with Dr. Lamonte Sakai.   Lauraine Rinne, NP 08/20/2020   This appointment required 44 minutes of patient care (this includes precharting, chart review, review of results, face-to-face care, etc.).

## 2020-08-20 NOTE — Progress Notes (Signed)
Full PFT performed today. °

## 2020-08-20 NOTE — Patient Instructions (Addendum)
You were seen today by Lauraine Rinne, NP  for:   It was nice seeing you in clinic today.  I am glad that your cough is improving.  Lets add some other medications to help with management of your postnasal drip.  I have sent in medications to the pharmacy.  As reviewed today chlor tabs are sedating should only be used at night.  I have sent in prescriptions for chlor tabs as well as for Flonase.  Please continue to take your acid reflux medication.  Please also follow acid reflux lifestyle changes as recommended below.  We will see you back in 3 months.  As discussed today we recommend the COVID-19 vaccinations.  If you have additional questions or concerns and would like to discuss this further please contact our office.  Have a very happy holidays, stay safe.  Chancy Hurter  1. Chronic cough  - chlorpheniramine (CHLOR-TRIMETON) 4 MG tablet; Chlorpheniramine (aka Chlor tabs)4 mg tablet (1 to 2 tablets at night) for management of allergies and postnasal drip at night  Dispense: 60 tablet; Refill: 3  Continue taking loratadine daily in the morning  Please start taking chlorpheniramine (aka Chlor tabs) 4 mg tablet (1 to 2 tablets at night) for management of allergies and postnasal drip at night >>> This is an over-the-counter medication >>> This medication is sedating  Once cough is better controlled okay to try to taper off of using the chlor tabs and can start using fluticasone/Flonase nasal spray 1 spray each nostril  Continue Protonix 40 mg >>>Please take 1 tablet daily 15 minutes to 30 minutes before your first meal of the day as well as before your other medications >>>Try to take at the same time each day >>>take this medication daily  GERD management: >>>Avoid laying flat until 2 hours after meals >>>Elevate head of the bed including entire chest >>>Reduce size of meals and amount of fat, acid, spices, caffeine and sweets >>>If you are smoking, Please stop! >>>Decrease  alcohol consumption >>>Work on maintaining a healthy weight with normal BMI    2. OSA (obstructive sleep apnea)  Please try to resume using your CPAP at night Bring your CPAP to your next appointment so we can obtain a compliance download  We recommend that you continue using your CPAP daily >>>Keep up the hard work using your device >>> Goal should be wearing this for the entire night that you are sleeping, at least 4 to 6 hours  Remember:  . Do not drive or operate heavy machinery if tired or drowsy.  . Please notify the supply company and office if you are unable to use your device regularly due to missing supplies or machine being broken.  . Work on maintaining a healthy weight and following your recommended nutrition plan  . Maintain proper daily exercise and movement  . Maintaining proper use of your device can also help improve management of other chronic illnesses such as: Blood pressure, blood sugars, and weight management.   BiPAP/ CPAP Cleaning:  >>>Clean weekly, with Dawn soap, and bottle brush.  Set up to air dry. >>> Wipe mask out daily with wet wipe or towelette    3. Healthcare maintenance  High-dose flu vaccine today  We recommend that you obtain the COVID-19 vaccinations as you are at high risk for complications if you did contract COVID-19  We recommend today:   Meds ordered this encounter  Medications  . chlorpheniramine (CHLOR-TRIMETON) 4 MG tablet    Sig: Chlorpheniramine (  aka Chlor tabs)4 mg tablet (1 to 2 tablets at night) for management of allergies and postnasal drip at night    Dispense:  60 tablet    Refill:  3  . fluticasone (FLONASE) 50 MCG/ACT nasal spray    Sig: Place 1 spray into both nostrils daily.    Dispense:  16 g    Refill:  3    Follow Up:    Return in about 3 months (around 11/20/2020), or if symptoms worsen or fail to improve, for Follow up with Dr. Lamonte Sakai.   Notification of test results are managed in the following manner:  If there are  any recommendations or changes to the  plan of care discussed in office today,  we will contact you and let you know what they are. If you do not hear from [place], then your results are normal and you can view them through your  MyChart account , or a letter will be sent to you. Thank you again for trusting [place] with your care  - Thank you, [facility]    It is flu season:   >>> Best ways to protect herself from the flu: Receive the yearly flu vaccine, practice good hand hygiene washing with soap and also using hand sanitizer when available, eat a nutritious meals, get adequate rest, hydrate appropriately       Please contact the office if your symptoms worsen or you have concerns that you are not improving.   Thank you for choosing [facility] for your healthcare, and for allowing [place] to partner with you on your healthcare journey. I am thankful to be able to provide care to you today.   Wyn Quaker FNP-C

## 2020-08-20 NOTE — Assessment & Plan Note (Signed)
Patient unvaccinated [id number], she has vaccine hesitancy Offered to try to help answer questions for COVID-19 vaccine, patient was unable to voice for her concerns were  Plan: Recommend COVID-19 vaccination, patient is aware that she is high risk for complications should she contract COVID-19 given the fact that her BMI is over 79, age over 54, has type 2 diabetes High-dose seasonal flu vaccine today

## 2020-08-20 NOTE — Assessment & Plan Note (Signed)
Unable to obtain compliance report today Patient reports this was previously managed by PCP Patient struggling with compliance due to chronic cough  Plan:  encourage patient to resume CPAP use Please bring CPAP to next appointment so we can obtain a download

## 2020-08-20 NOTE — Assessment & Plan Note (Signed)
Improvement in cough since starting daily antihistamine, PPI, stopping losartan.  Patient still having a persistent cough especially after eating.  Cough is occasionally productive with clear phlegm. Patient denies any sort of symptoms of acid reflux.  Patient with rhinorrhea and postnasal drip on exam  Plan: Continue loratadine Continue Protonix Elevate head of bed Start chlor tabs at night Can start Flonase 33-month follow-up with our office

## 2020-08-21 ENCOUNTER — Other Ambulatory Visit: Payer: Self-pay | Admitting: Emergency Medicine

## 2020-08-23 ENCOUNTER — Other Ambulatory Visit: Payer: Self-pay | Admitting: Cardiovascular Disease

## 2020-08-24 NOTE — Telephone Encounter (Signed)
Dr. Byrum, please advise if you are okay refilling med or if this needs to be deferred to PCP 

## 2020-08-25 ENCOUNTER — Telehealth: Payer: Self-pay | Admitting: Pulmonary Disease

## 2020-08-25 DIAGNOSIS — R053 Chronic cough: Secondary | ICD-10-CM

## 2020-08-25 NOTE — Telephone Encounter (Signed)
LMTCB x1 for pt.  

## 2020-09-02 MED ORDER — CHLORPHENIRAMINE MALEATE 4 MG PO TABS: 4.0000 mg | ORAL_TABLET | Freq: Every evening | ORAL | 3 refills | Status: DC | PRN

## 2020-09-02 MED ORDER — CHLORPHENIRAMINE MALEATE 4 MG PO TABS: ORAL_TABLET | ORAL | 3 refills | Status: DC

## 2020-09-02 NOTE — Telephone Encounter (Signed)
I see that the rx pt called about for chlor tabs was printed instead of being sent electronically  I have sent the rx electronically  LMTCB to let pt know

## 2020-09-02 NOTE — Telephone Encounter (Signed)
Patient is returning phone call. Patient phone number is [phone].

## 2020-09-04 ENCOUNTER — Encounter (INDEPENDENT_AMBULATORY_CARE_PROVIDER_SITE_OTHER): Payer: PPO | Admitting: Ophthalmology

## 2020-09-08 NOTE — Telephone Encounter (Signed)
Called and left detailed message for pt reiterating Leslie's message and to call back if she needs anything. Will sign off.

## 2020-09-16 ENCOUNTER — Encounter (INDEPENDENT_AMBULATORY_CARE_PROVIDER_SITE_OTHER): Payer: PPO | Admitting: Ophthalmology

## 2020-09-17 ENCOUNTER — Encounter (INDEPENDENT_AMBULATORY_CARE_PROVIDER_SITE_OTHER): Payer: PPO | Admitting: Ophthalmology

## 2020-09-17 ENCOUNTER — Other Ambulatory Visit: Payer: Self-pay

## 2020-09-17 DIAGNOSIS — H35033 Hypertensive retinopathy, bilateral: Secondary | ICD-10-CM

## 2020-09-17 DIAGNOSIS — E113391 Type 2 diabetes mellitus with moderate nonproliferative diabetic retinopathy without macular edema, right eye: Secondary | ICD-10-CM | POA: Diagnosis not present

## 2020-09-17 DIAGNOSIS — H33301 Unspecified retinal break, right eye: Secondary | ICD-10-CM

## 2020-09-17 DIAGNOSIS — I10 Essential (primary) hypertension: Secondary | ICD-10-CM | POA: Diagnosis not present

## 2020-09-17 DIAGNOSIS — E11311 Type 2 diabetes mellitus with unspecified diabetic retinopathy with macular edema: Secondary | ICD-10-CM | POA: Diagnosis not present

## 2020-09-17 DIAGNOSIS — E113312 Type 2 diabetes mellitus with moderate nonproliferative diabetic retinopathy with macular edema, left eye: Secondary | ICD-10-CM | POA: Diagnosis not present

## 2020-09-24 ENCOUNTER — Other Ambulatory Visit: Payer: Self-pay | Admitting: Emergency Medicine

## 2020-10-07 ENCOUNTER — Other Ambulatory Visit: Payer: Self-pay | Admitting: Emergency Medicine

## 2020-10-12 DIAGNOSIS — N3946 Mixed incontinence: Secondary | ICD-10-CM | POA: Diagnosis not present

## 2020-10-12 DIAGNOSIS — N950 Postmenopausal bleeding: Secondary | ICD-10-CM | POA: Diagnosis not present

## 2020-10-12 DIAGNOSIS — N812 Incomplete uterovaginal prolapse: Secondary | ICD-10-CM | POA: Diagnosis not present

## 2020-11-05 DIAGNOSIS — N950 Postmenopausal bleeding: Secondary | ICD-10-CM | POA: Diagnosis not present

## 2020-12-23 ENCOUNTER — Other Ambulatory Visit: Payer: Self-pay | Admitting: Cardiovascular Disease

## 2021-01-01 ENCOUNTER — Other Ambulatory Visit: Payer: Self-pay | Admitting: Emergency Medicine

## 2021-01-11 ENCOUNTER — Other Ambulatory Visit: Payer: Self-pay | Admitting: Emergency Medicine

## 2021-01-11 NOTE — Telephone Encounter (Signed)
Dr. Lamonte Sakai, please advise if you are okay refilling med or if this needs to be deferred to PCP

## 2021-01-21 ENCOUNTER — Telehealth: Payer: Self-pay

## 2021-01-21 NOTE — Telephone Encounter (Signed)
Attempt made to contact Alicia Crawford is a 65 y.o. female re: New pt Pre appt call to collect history information.  -Allergy -Medication -Confirm pharmacy -OB history   Pt was not available. LM on the VM for the patient to call back

## 2021-01-21 NOTE — Progress Notes (Signed)
[facility] New Patient Evaluation and Consultation  Referring Provider: Ma Hillock, * PCP: Eunice Blase, MD Date of Service: 01/22/2021  SUBJECTIVE Chief Complaint: New Patient (Initial Visit) - prolapse  History of Present Illness: Alicia Crawford is a 65 y.o. White or Caucasian female seen in consultation at the request of NP Orischak for evaluation of prolapse.    Review of records significant for: Has a cystocele with uterine prolapse. Has to manually reduce the prolapse. Also has stress and urgency incontinence.  Urinary Symptoms: Leaks urine with cough/ sneeze. Denies urgency/ frequency Leaks "all day", small amounts Pad use: 2 pads per day.   She is not bothered by her UI symptoms.  Day time voids- normal, many times due to water intake.  Nocturia: 2 times per night to void. Voiding dysfunction: she does not empty her bladder well.  does not use a catheter to empty bladder.  When urinating, she feels she has no difficulties  UTIs: 0 UTI's in the last year.   Reports history of kidney or bladder stones- 3 years ago  Pelvic Organ Prolapse Symptoms:                  She Denies a feeling of a bulge the vaginal area. It has been present for 6 months- 1 year She Admits to seeing a bulge- its always hanging out This bulge is bothersome.  Bowel Symptom: Bowel movements: 2 time(s) per day Stool consistency: soft  Straining: no.  Splinting: no.  Incomplete evacuation: no.  She Denies accidental bowel leakage / fecal incontinence Bowel regimen: fiber   Sexual Function Sexually active: yes- but is embarrassed due to prolapse Sexual orientation: Straight Pain with sex: No  Pelvic Pain Denies pelvic pain   Past Medical History:  Past Medical History:  Diagnosis Date  . Ankle fracture 09/05/2016   right ankle  . Anoxic brain injury ([place]) 08/2010   "w/cardiac arrest"  . Anxiety Jan. 2012  . Blood transfusion 09/2010  . Cardiac arrest  ([place]) 08/04/2010  . Carotid artery occlusion    mild to moderate bilateral internal carotid artery stenosis  . Claudication ([place])   . Complication of anesthesia    [place]  . Coronary artery disease    CABG 2011 in [place]  . Coronary artery disease   . Dementia   . Depression   . DVT (deep venous thrombosis) ([place])   . GERD (gastroesophageal reflux disease)   . High cholesterol   . Hypertension   . Myocardial infarction [facility]) 09/27/10   in [place]  . Obesity   . Peripheral vascular disease (HCC)    bilat SFA diseae  . Renal artery stenosis ([place])   . Rotator cuff tendonitis   . Sleep apnea    uses CPAP nightly  . Stroke [facility]) 2011  . Subdural hematoma [facility]) May 25,2013   Frontal subdural  S/P evacuation of hematoma  . Type II diabetes mellitus (HCC)    Diet and Exercise     Past Surgical History:   Past Surgical History:  Procedure Laterality Date  . ABDOMINOPLASTY/PANNICULECTOMY     Had wound complication with MRSA after  . ATHERECTOMY N/A 01/17/2012   Procedure: ATHERECTOMY;  Surgeon: Lorretta Harp, MD;  Location: [facility] CATH LAB;  Service: Cardiovascular;  Laterality: N/A;  . BACK SURGERY    . CHOLECYSTECTOMY  ~ 1980's  . CORONARY ARTERY BYPASS GRAFT  09/2010   CABG X4  . CRANIOTOMY  02/25/2012  Procedure: CRANIOTOMY HEMATOMA EVACUATION SUBDURAL;  Surgeon: Eustace Moore, MD;  Location: [facility] NEURO ORS;  Service: Neurosurgery;  Laterality: Left;  Left craniotomy for evacuation of subdural hematoma  . LOWER EXTREMITY ANGIOGRAM  April 2013  . ORIF TOE FRACTURE Left 04/25/2019   Procedure: Open Reduction Internal Fixation (ORIF) Left Fifth Metatarsal Ronnald Ramp Fracture;  Surgeon: Wylene Simmer, MD;  Location: [facility];  Service: Orthopedics;  Laterality: Left;  . OVARIAN CYST REMOVAL  1983  . POSTERIOR FUSION LUMBAR SPINE  ` 2000  . SPINE SURGERY    . stomach and skin tuck  2000     Past OB/GYN History: G2 P2 Vaginal deliveries: 1,  Forceps/  Vacuum deliveries: 1, Cesarean section: 0 Menopausal: Yes, at age 56 Last pap smear was 2021- negative.  Any history of abnormal pap smears: no.  Has had postmenopausal bleeding- negative ultrasound workup. She feels the pad is causing the bleeding because her vagina rubs against   Medications: She has a current medication list which includes the following prescription(s): vitamin c, aspirin ec, atorvastatin, chlorpheniramine, cholecalciferol, cilostazol, coenzyme q10, fluticasone, irbesartan, loratadine, losartan, melatonin, metformin, metoprolol tartrate, multivitamin with minerals, multiple vitamins-minerals, omega-3 fish oil, pantoprazole, prebiotic product, probiotic product, repatha sureclick, and sertraline.   Allergies: Patient is allergic to halothane.   Social History:  Social History   Tobacco Use  . Smoking status: Former Smoker    Packs/day: 1.00    Years: 30.00    Pack years: 30.00    Types: Cigarettes    Quit date: 06/23/2003    Years since quitting: 17.5  . Smokeless tobacco: Never Used  Substance Use Topics  . Alcohol use: Not Currently    Alcohol/week: 0.0 standard drinks    Comment: 01/17/12 "used to have glass of wine now & then; last wine was ~ 2011"  . Drug use: No    Relationship status: married She lives with husband.   She is not employed. Regular exercise: Yes: walking History of abuse: No  Family History:   Family History  Problem Relation Age of Onset  . Heart disease Mother        before age 23  . Diabetes Mother   . Hyperlipidemia Mother   . Hypertension Mother   . Heart attack Mother   . Heart disease Father        Before age 21  . Hyperlipidemia Father   . Hypertension Father   . Heart attack Father   . Heart disease Sister        Before age 75  . Heart attack Sister   . Hyperlipidemia Brother   . Hypertension Brother   . Heart attack Brother   . Heart disease Brother        before age 86  . Heart disease Other   . Diabetes Other    . Hypertension Other   . Alcohol abuse Other   . Mental illness Other      Review of Systems: ROS   OBJECTIVE Physical Exam: Vitals:   01/22/21 1004  BP: 136/89  Pulse: 79  Weight: 187 lb (84.8 kg)  Height: 5' 2.5" (1.588 m)    Physical Exam   GU / Detailed Urogynecologic Evaluation:  Pelvic Exam: Normal external female genitalia; Bartholin's and Skene's glands normal in appearance; urethral meatus normal in appearance, no urethral masses or discharge.   CST: negative  Speculum exam reveals normal vaginal mucosa with atrophy. Cervix normal appearance with large ectropion. Uterus normal single,  nontender. Adnexa no mass, fullness, tenderness.     Pelvic floor strength II/V  Pelvic floor musculature: Right levator non-tender, Right obturator non-tender, Left levator non-tender, Left obturator non-tender  POP-Q:  Complete prolapse noted  POP-Q  3                                            Aa   7                                           Ba  8                                              C   7                                            Gh  4                                            Pb  8                                            tvl   3                                            Ap  7                                            Bp  -2                                              D     Rectal Exam:  Normal sphincter tone, moderate distal rectocele, enterocoele not present, no rectal masses, no sign of dyssynergia when asking the patient to bear down.  Post-Void Residual (PVR) by Bladder Scan: In order to evaluate bladder emptying, we discussed obtaining a postvoid residual and she agreed to this procedure.  Procedure: The ultrasound unit was placed on the patient's abdomen in the suprapubic region after the patient had voided. A PVR of 2 ml was obtained by bladder scan.  Laboratory Results: Unable to leave urine sample  ASSESSMENT AND PLAN Ms.  Monger is a 65 y.o. with:  1. Uterovaginal prolapse, complete   2. Prolapse of anterior vaginal wall   3. Prolapse of posterior vaginal wall   4. Urinary frequency   5. SUI (stress urinary incontinence, female)  1. Stage IV anterior, Stage IV posterior, Stage IV apical prolapse For treatment of pelvic organ prolapse, we discussed options for management including expectant management, conservative management, and surgical management, such as Kegels, a pessary, pelvic floor physical therapy, and specific surgical procedures. - She is interested in starting with a pessary. Will have her return for a pessary fitting.  - If she does decide on surgery in the future, would need clearance from PCP and cardiologist due to extensive cardiac hx and DM.   2. Frequency - due to water that she drinks, does not have urgency and is not bothered by it  3. SUI For treatment of stress urinary incontinence,  non-surgical options include expectant management, weight loss, physical therapy, as well as a pessary.  Surgical options include a midurethral sling, Burch urethropexy, and transurethral injection of a bulking agent. - Will reassess after pessary placement.   Return for pessary fitting   Jaquita Folds, MD   Medical Decision Making:  - Review and summation of prior records

## 2021-01-22 ENCOUNTER — Other Ambulatory Visit: Payer: Self-pay

## 2021-01-22 ENCOUNTER — Ambulatory Visit (INDEPENDENT_AMBULATORY_CARE_PROVIDER_SITE_OTHER): Payer: PPO | Admitting: Obstetrics and Gynecology

## 2021-01-22 ENCOUNTER — Encounter: Payer: Self-pay | Admitting: Obstetrics and Gynecology

## 2021-01-22 VITALS — BP 136/89 | HR 79 | Ht 62.5 in | Wt 187.0 lb

## 2021-01-22 DIAGNOSIS — N816 Rectocele: Secondary | ICD-10-CM | POA: Diagnosis not present

## 2021-01-22 DIAGNOSIS — R350 Frequency of micturition: Secondary | ICD-10-CM

## 2021-01-22 DIAGNOSIS — N393 Stress incontinence (female) (male): Secondary | ICD-10-CM | POA: Diagnosis not present

## 2021-01-22 DIAGNOSIS — N813 Complete uterovaginal prolapse: Secondary | ICD-10-CM | POA: Diagnosis not present

## 2021-01-22 DIAGNOSIS — N8110 Cystocele, unspecified: Secondary | ICD-10-CM | POA: Diagnosis not present

## 2021-01-22 NOTE — Patient Instructions (Signed)
You have a stage 4 (out of 4) prolapse.  We discussed the fact that it is not life threatening but there are several treatment options. For treatment of pelvic organ prolapse, we discussed options for management including expectant management, conservative management, and surgical management, such as Kegels, a pessary, pelvic floor physical therapy, and specific surgical procedures.    

## 2021-01-27 ENCOUNTER — Telehealth: Payer: Self-pay | Admitting: Psychiatry

## 2021-01-27 ENCOUNTER — Other Ambulatory Visit: Payer: Self-pay

## 2021-01-27 ENCOUNTER — Telehealth: Payer: Self-pay | Admitting: Emergency Medicine

## 2021-01-27 DIAGNOSIS — F325 Major depressive disorder, single episode, in full remission: Secondary | ICD-10-CM

## 2021-01-27 MED ORDER — SERTRALINE HCL 100 MG PO TABS: 150.0000 mg | ORAL_TABLET | Freq: Every day | ORAL | 0 refills | Status: DC

## 2021-01-27 NOTE — Telephone Encounter (Signed)
Rx sent 

## 2021-01-27 NOTE — Telephone Encounter (Signed)
Next visit is 03/10/21. Requesting refill on Sertraline 100 mg called to:  [facility] [place], [place] [address] Phone:  [phone]  Fax:  [phone]

## 2021-01-27 NOTE — Telephone Encounter (Signed)
Called and spoke with pt and she stated that BPM wanted to get her set up for a HST.  There was no mention of this in his note.  I have scheduled her to see RB on Friday since it is past her 3 month follow up time.  Pt is aware of appt date and time.

## 2021-01-29 ENCOUNTER — Ambulatory Visit: Payer: Self-pay | Admitting: Emergency Medicine

## 2021-02-01 NOTE — Progress Notes (Addendum)
Alicia Crawford   Subjective:     Chief Complaint: Pessary fitting Alicia Crawford is a 65 y.o. female here for a pessary fitting.)  History of Present Illness: Alicia Crawford is a 65 y.o. female with stage II pelvic organ prolapse and stress incontinence who presents today for a pessary fitting.   No changes in her symptoms  Past Medical History: Patient  has a past medical history of Ankle fracture (09/05/2016), Anoxic brain injury ([place]) (08/2010), Anxiety (Jan. 2012), Blood transfusion (09/2010), Cardiac arrest ([place]) (08/04/2010), Carotid artery occlusion, Claudication ([place]), Complication of anesthesia, Coronary artery disease, Coronary artery disease, Dementia, Depression, DVT (deep venous thrombosis) ([place]), GERD (gastroesophageal reflux disease), High cholesterol, Hypertension, Myocardial infarction ([place]) (09/27/10), Obesity, Peripheral vascular disease ([place]), Renal artery stenosis ([place]), Rotator cuff tendonitis, Sleep apnea, Stroke ([place]) (2011), Subdural hematoma [facility]) (May 25,2013), and Type II diabetes mellitus ([place]).   Past Surgical History: She  has a past surgical history that includes Ovarian cyst removal (1983); stomach and skin tuck (2000); Posterior fusion lumbar spine (` 2000); Cholecystectomy (~ 1980's); Coronary artery bypass graft (09/2010); Back surgery; Craniotomy (02/25/2012); Lower extremity angiogram (April 2013); Spine surgery; atherectomy (N/A, 01/17/2012); ORIF toe fracture (Left, 04/25/2019); and Abdominoplasty/panniculectomy.   Medications: She has a current medication list which includes the following prescription(s): vitamin c, aspirin ec, atorvastatin, chlorpheniramine, cholecalciferol, cilostazol, coenzyme q10, [START ON 02/04/2021] estradiol, fluticasone, irbesartan, loratadine, losartan, melatonin, metformin, metoprolol tartrate, multivitamin with minerals, multiple vitamins-minerals, omega-3 fish oil, pantoprazole, prebiotic product, probiotic  product, repatha sureclick, and sertraline.   Allergies: Patient is allergic to halothane.   Social History: Patient  reports that she quit smoking about 17 years ago. Her smoking use included cigarettes. She has a 30.00 pack-year smoking history. She has never used smokeless tobacco. She reports previous alcohol use. She reports that she does not use drugs.      Objective:    BP 114/72   Pulse (!) 105   Ht 5\' 2"  (1.575 m)   Wt 183 lb (83 kg)   BMI 33.47 kg/m  Gen: No apparent distress, A&O x 3. Pelvic Exam: Normal external female genitalia; Bartholin's and Skene's glands normal in appearance; urethral meatus normal in appearance, no urethral masses or discharge.   A size 2 3/4in incontinence ring with support pessary was fitted. She easily pushed this out with valsalva. A 3in ss gellhorn pessary was then fit.  It was comfortable, stayed in place with valsalva, bending, walking and standing, and was an appropriate size on examination, with one finger fitting between the pessary and the vaginal walls. She was able to demonstrate proper placement and removal. Leakage was demonstrated with valsalva with the pessary in place. Lot #[id number], Exp 08/11/25  POP-Q (01/22/21):  Complete prolapse noted  POP-Q  3                                            Aa   7                                           Ba  8  C   7                                            Gh  4                                            Pb  8                                            tvl   3                                            Ap  7                                            Bp  -2                                              D     Assessment/Plan:    Assessment: Alicia Crawford is a 65 y.o. with stage IV pelvic organ prolapse who presents for a pessary fitting. Plan: - She was fitted with a 3in ss gellhorn pessary. She will remove weekly or as needed  for intercourse.   - Prescribed estrace cream 0.5g twice weekly. Advised to insert as fas as she can.  - We discussed that if she is happy with the pessary, can consider urethral bulking in the office to help with stress incontinence. She is going to speak with her PCP about her health and potential for surgery.   Follow-up in 2-3  months for a pessary check or sooner as needed.  All questions were answered.    Jaquita Folds, MD

## 2021-02-02 ENCOUNTER — Encounter: Payer: Self-pay | Admitting: Obstetrics and Gynecology

## 2021-02-02 ENCOUNTER — Ambulatory Visit (INDEPENDENT_AMBULATORY_CARE_PROVIDER_SITE_OTHER): Payer: PPO | Admitting: Obstetrics and Gynecology

## 2021-02-02 ENCOUNTER — Other Ambulatory Visit: Payer: Self-pay

## 2021-02-02 ENCOUNTER — Other Ambulatory Visit: Payer: Self-pay | Admitting: Cardiovascular Disease

## 2021-02-02 VITALS — BP 114/72 | HR 105 | Ht 62.0 in | Wt 183.0 lb

## 2021-02-02 DIAGNOSIS — N813 Complete uterovaginal prolapse: Secondary | ICD-10-CM

## 2021-02-02 DIAGNOSIS — N393 Stress incontinence (female) (male): Secondary | ICD-10-CM | POA: Diagnosis not present

## 2021-02-02 DIAGNOSIS — N816 Rectocele: Secondary | ICD-10-CM | POA: Diagnosis not present

## 2021-02-02 DIAGNOSIS — N952 Postmenopausal atrophic vaginitis: Secondary | ICD-10-CM

## 2021-02-02 DIAGNOSIS — N8110 Cystocele, unspecified: Secondary | ICD-10-CM | POA: Diagnosis not present

## 2021-02-02 MED ORDER — ESTRADIOL 0.1 MG/GM VA CREA: 0.5000 g | TOPICAL_CREAM | VAGINAL | 11 refills | Status: DC

## 2021-02-02 NOTE — Patient Instructions (Signed)
You were fitted with a gellhorn pessary.   You can take it out every week, or sooner if you desire. Clean with liquid soap and water in between uses and leave out over night on the night that you remove it. Call for any problems.   Use estrace cream twice a week in the vagina.

## 2021-02-04 ENCOUNTER — Encounter (INDEPENDENT_AMBULATORY_CARE_PROVIDER_SITE_OTHER): Payer: PPO | Admitting: Ophthalmology

## 2021-02-11 ENCOUNTER — Encounter (INDEPENDENT_AMBULATORY_CARE_PROVIDER_SITE_OTHER): Payer: PPO | Admitting: Ophthalmology

## 2021-02-11 ENCOUNTER — Other Ambulatory Visit: Payer: Self-pay

## 2021-02-11 DIAGNOSIS — E113312 Type 2 diabetes mellitus with moderate nonproliferative diabetic retinopathy with macular edema, left eye: Secondary | ICD-10-CM | POA: Diagnosis not present

## 2021-02-11 DIAGNOSIS — I10 Essential (primary) hypertension: Secondary | ICD-10-CM | POA: Diagnosis not present

## 2021-02-11 DIAGNOSIS — H33301 Unspecified retinal break, right eye: Secondary | ICD-10-CM

## 2021-02-11 DIAGNOSIS — H43813 Vitreous degeneration, bilateral: Secondary | ICD-10-CM

## 2021-02-11 DIAGNOSIS — E113291 Type 2 diabetes mellitus with mild nonproliferative diabetic retinopathy without macular edema, right eye: Secondary | ICD-10-CM | POA: Diagnosis not present

## 2021-02-11 DIAGNOSIS — H35033 Hypertensive retinopathy, bilateral: Secondary | ICD-10-CM

## 2021-03-02 DIAGNOSIS — Z0000 Encounter for general adult medical examination without abnormal findings: Secondary | ICD-10-CM | POA: Diagnosis not present

## 2021-03-02 DIAGNOSIS — E785 Hyperlipidemia, unspecified: Secondary | ICD-10-CM | POA: Diagnosis not present

## 2021-03-02 DIAGNOSIS — E11319 Type 2 diabetes mellitus with unspecified diabetic retinopathy without macular edema: Secondary | ICD-10-CM | POA: Diagnosis not present

## 2021-03-02 DIAGNOSIS — Z23 Encounter for immunization: Secondary | ICD-10-CM | POA: Diagnosis not present

## 2021-03-02 DIAGNOSIS — I739 Peripheral vascular disease, unspecified: Secondary | ICD-10-CM | POA: Diagnosis not present

## 2021-03-02 DIAGNOSIS — E1169 Type 2 diabetes mellitus with other specified complication: Secondary | ICD-10-CM | POA: Diagnosis not present

## 2021-03-02 DIAGNOSIS — I10 Essential (primary) hypertension: Secondary | ICD-10-CM | POA: Diagnosis not present

## 2021-03-02 DIAGNOSIS — D649 Anemia, unspecified: Secondary | ICD-10-CM | POA: Diagnosis not present

## 2021-03-02 DIAGNOSIS — F329 Major depressive disorder, single episode, unspecified: Secondary | ICD-10-CM | POA: Diagnosis not present

## 2021-03-02 DIAGNOSIS — Z7984 Long term (current) use of oral hypoglycemic drugs: Secondary | ICD-10-CM | POA: Diagnosis not present

## 2021-03-03 ENCOUNTER — Encounter: Payer: Self-pay | Admitting: Emergency Medicine

## 2021-03-03 ENCOUNTER — Other Ambulatory Visit: Payer: Self-pay

## 2021-03-03 ENCOUNTER — Ambulatory Visit: Payer: PPO | Admitting: Emergency Medicine

## 2021-03-03 DIAGNOSIS — R053 Chronic cough: Secondary | ICD-10-CM | POA: Diagnosis not present

## 2021-03-03 DIAGNOSIS — G4733 Obstructive sleep apnea (adult) (pediatric): Secondary | ICD-10-CM | POA: Diagnosis not present

## 2021-03-03 DIAGNOSIS — N390 Urinary tract infection, site not specified: Secondary | ICD-10-CM

## 2021-03-03 DIAGNOSIS — E875 Hyperkalemia: Secondary | ICD-10-CM

## 2021-03-03 HISTORY — DX: Urinary tract infection, site not specified: N39.0

## 2021-03-03 HISTORY — DX: Hyperkalemia: E87.5

## 2021-03-03 MED ORDER — PANTOPRAZOLE SODIUM 40 MG PO TBEC: 40.0000 mg | DELAYED_RELEASE_TABLET | Freq: Two times a day (BID) | ORAL | 3 refills | Status: DC

## 2021-03-03 NOTE — Patient Instructions (Addendum)
Stop losartan.  He do not need to take this medication anymore. Continue irbesartan 150 mg once daily. Stay off the fish oil Increase your pantoprazole (Protonix) to 40 mg twice a day.  Take this medication 1 hour around food. Continue loratadine 10 mg once daily. Continue your fluticasone nasal spray, 2 sprays each nostril once daily.  Try not to take this medication right before bedtime or else it would drain to your throat and possibly irritate your coughing. Use chlorpheniramine (chlor tabs) 4 mg each evening. If we can get your cough under better control would like to get you back on your CPAP every night. Follow with APP in 2 months and with Dr Lamonte Sakai in 6 months.  Call sooner if you have any problems.

## 2021-03-03 NOTE — Assessment & Plan Note (Signed)
She has not been using her CPAP because nocturnal cough is precluding it.  I do believe she benefit from getting back on to soon as possible.  We talked about trying to restart as her cough improves.

## 2021-03-03 NOTE — Progress Notes (Signed)
Subjective:    Patient ID: Alicia Crawford, female    DOB: 04-23-1956, 65 y.o.   MRN: [id number]  HPI 65 year old former smoker (30 pack years) with a history of coronary disease, MI and CABG 2542 complicated by cardiac arrest with prolonged critical care hospitalization and ventilation.  Also history of hypertension, DVT, hyperlipidemia, PVD, diabetes, OSA on CPAP.  She is referred today for evaluation of coughing.  She reports longstanding cough ever since her CC hospitalization 2012. Was worst at night initially, now happens at any time. Can still wake her from sleep. Now she deals with paroxysms, sometimes resulting in emesis. She has been on cough suppressants over the years without much response. Seems to be better with lozenges, with drinking water these days. She feels tickle in her throat. Cough is never productive. No overt aspiration sx. No dyspnea. Some nasal congestion, not severe. Never has GERD sx. No difficulty with pills. Does not seem to affect her voice.   She is on losartan, omega-3 fish oil  Chest x-ray done 03/31/2020 reviewed by me, shows normal heart size, prior thoracic surgery, no infiltrates or effusions.  ROV 03/03/21 --65 year old woman follows up for chronic cough, obstructive sleep apnea.  She is a former smoker (30 pack years) with a history of CAD/CABG, cardiac arrest with a prolonged critical care hospitalization and ventilation, hypertension, DVT, hyperlipidemia, diabetes. She deals with longstanding cough that has been present ever since her critical care hospitalization in 2012.  We tried stopping her fish oil, adding a PPI and an antihistamine > loratadine, Protonix 40 mg daily, fluticasone NS right before bedtime. Tried chlorpheniramine 4 to 8 mg nightly if needed. She has been using the chlor-tab 4mg  in the evening with some benefit. Denies any GERD on protonix qd. Her cough persists, little change. Happens at any time during the day - sometimes with  post-tussive emesis.  She is unable to use her CPAP due to nocturnal cough.   Pulmonary function testing 08/20/2020 reviewed by me, shows normal airflows without a bronchodilator response, decreased RV consistent with restriction, normal diffusion capacity.   Review of Systems As per HPI  Past Medical History:  Diagnosis Date  . Ankle fracture 09/05/2016   right ankle  . Anoxic brain injury ([place]) 08/2010   "w/cardiac arrest"  . Anxiety Jan. 2012  . Blood transfusion 09/2010  . Cardiac arrest ([place]) 08/04/2010  . Carotid artery occlusion    mild to moderate bilateral internal carotid artery stenosis  . Claudication ([place])   . Complication of anesthesia    [place]  . Coronary artery disease    CABG 2011 in [place]  . Coronary artery disease   . Dementia   . Depression   . DVT (deep venous thrombosis) ([place])   . GERD (gastroesophageal reflux disease)   . High cholesterol   . Hypertension   . Myocardial infarction [facility]) 09/27/10   in [place]  . Obesity   . Peripheral vascular disease (HCC)    bilat SFA diseae  . Renal artery stenosis ([place])   . Rotator cuff tendonitis   . Sleep apnea    uses CPAP nightly  . Stroke [facility]) 2011  . Subdural hematoma [facility]) May 25,2013   Frontal subdural  S/P evacuation of hematoma  . Type II diabetes mellitus (HCC)    Diet and Exercise     Family History  Problem Relation Age of Onset  . Heart disease Mother        before  age 44  . Diabetes Mother   . Hyperlipidemia Mother   . Hypertension Mother   . Heart attack Mother   . Heart disease Father        Before age 84  . Hyperlipidemia Father   . Hypertension Father   . Heart attack Father   . Heart disease Sister        Before age 34  . Heart attack Sister   . Hyperlipidemia Brother   . Hypertension Brother   . Heart attack Brother   . Heart disease Brother        before age 52  . Heart disease Other   . Diabetes Other   . Hypertension Other   . Alcohol abuse Other    . Mental illness Other      Social History   Socioeconomic History  . Marital status: Married    Spouse name: Not on file  . Number of children: Not on file  . Years of education: Not on file  . Highest education level: Not on file  Occupational History  . Not on file  Tobacco Use  . Smoking status: Former Smoker    Packs/day: 1.00    Years: 30.00    Pack years: 30.00    Types: Cigarettes    Quit date: 06/23/2003    Years since quitting: 17.7  . Smokeless tobacco: Never Used  Vaping Use  . Vaping Use: Not on file  Substance and Sexual Activity  . Alcohol use: Not Currently    Alcohol/week: 0.0 standard drinks    Comment: 01/17/12 "used to have glass of wine now & then; last wine was ~ 2011"  . Drug use: No  . Sexual activity: Yes    Birth control/protection: None  Other Topics Concern  . Not on file  Social History Narrative  . Not on file   Social Determinants of Health   Financial Resource Strain: Not on file  Food Insecurity: Not on file  Transportation Needs: Not on file  Physical Activity: Not on file  Stress: Not on file  Social Connections: Not on file  Intimate Partner Violence: Not on file    From [place], moved to [place] Has worked office work for [employer], Designer, television/film set co No inhaled exposures   Allergies  Allergen Reactions  . [place] Hives and Other (See Comments)    Gas used 1980's Reaction: affected her liver     Outpatient Medications Prior to Visit  Medication Sig Dispense Refill  . Ascorbic Acid (VITAMIN C) 100 MG tablet Take 100 mg by mouth daily.    [name] aspirin EC 81 MG tablet Take 81 mg by mouth daily.    [name] atorvastatin (LIPITOR) 80 MG tablet TAKE 1 TABLET BY MOUTH DAILY 90 tablet 3  . chlorpheniramine (CHLOR-TRIMETON) 4 MG tablet Take 1-2 tablets (4-8 mg total) by mouth at bedtime as needed for allergies. 60 tablet 3  . cholecalciferol (VITAMIN D3) 25 MCG (1000 UNIT) tablet Take 1,000 Units by mouth daily.    . cilostazol (PLETAL) 100 MG tablet  TAKE 1 TABLET BY MOUTH TWICE DAILY BEFORE MEALS 180 tablet 3  . Coenzyme Q10 300 MG CAPS Take 1 capsule by mouth daily.    [name] estradiol (ESTRACE) 0.1 MG/GM vaginal cream Place 0.5 g vaginally 2 (two) times a week. Place 0.5g nightly for two weeks then twice a week after 30 g 11  . fluticasone (FLONASE) 50 MCG/ACT nasal spray Place 1 spray into both nostrils daily. 16 g  3  . irbesartan (AVAPRO) 150 MG tablet TAKE 1 TABLET(150 MG) BY MOUTH DAILY 30 tablet 2  . loratadine (CLARITIN) 10 MG tablet Take 1 tablet (10 mg total) by mouth daily. 30 tablet 11  . MELATONIN PO Take by mouth.    . metFORMIN (GLUCOPHAGE) 1000 MG tablet Take 1,000 mg by mouth daily.   0  . metoprolol tartrate (LOPRESSOR) 25 MG tablet TAKE 1 TABLET(25 MG) BY MOUTH TWICE DAILY 180 tablet 3  . Multiple Vitamin (MULITIVITAMIN WITH MINERALS) TABS Take 1 tablet by mouth daily.    . Multiple Vitamins-Minerals (ZINC PO) Take by mouth.    . omega-3 fish oil (MAXEPA) 1000 MG CAPS capsule Take 1 capsule by mouth daily. Mega Red    . PREBIOTIC PRODUCT PO Take by mouth.    . Probiotic Product (PROBIOTIC-10 PO) Take by mouth.    [name] REPATHA SURECLICK 948 MG/ML SOAJ ADMINISTER 1 ML UNDER THE SKIN EVERY 14 DAYS 2 mL 12  . sertraline (ZOLOFT) 100 MG tablet Take 1.5 tablets (150 mg total) by mouth daily. 135 tablet 0  . losartan (COZAAR) 50 MG tablet TAKE 1 TABLET(50 MG) BY MOUTH DAILY 90 tablet 3  . pantoprazole (PROTONIX) 40 MG tablet TAKE 1 TABLET(40 MG) BY MOUTH DAILY 90 tablet 3   No facility-administered medications prior to visit.         Objective:   Physical Exam Vitals:   03/03/21 1626  BP: 112/72  Pulse: (!) 110  Temp: (!) 97.4 F (36.3 C)  TempSrc: Temporal  SpO2: 95%  Weight: 180 lb 12.8 oz (82 kg)  Height: 5\' 3"  (1.6 m)   Gen: Pleasant, well-nourished, in no distress,  normal affect  ENT: No lesions,  mouth clear,  oropharynx clear, no postnasal drip  Neck: No JVD, no stridor  Lungs: No use of accessory muscles,  no crackles or wheezing on normal respiration, no wheeze on forced expiration  Cardiovascular: RRR, heart sounds normal, no murmur or gallops, no peripheral edema  Musculoskeletal: No deformities, no cyanosis or clubbing  Neuro: alert, awake, non focal  Skin: Warm, no lesions or rash     Assessment & Plan:  Chronic cough This is longstanding.  Difficult to tell exactly how much we have impacted it with more aggressive treatment of her rhinitis, GERD.  She does describe some improvement although it seems to be intermittent.  She still sometimes has posttussive emesis.  I will increase her PPI, outlined the other medications that we want her to continue including aggressive rhinitis regimen.  Stop losartan.  He do not need to take this medication anymore. Continue irbesartan 150 mg once daily. Stay off the fish oil Increase your pantoprazole (Protonix) to 40 mg twice a day.  Take this medication 1 hour around food. Continue loratadine 10 mg once daily. Continue your fluticasone nasal spray, 2 sprays each nostril once daily.  Try not to take this medication right before bedtime or else it would drain to your throat and possibly irritate your coughing. Use chlorpheniramine (chlor tabs) 4 mg each evening. Follow with APP in 2 months and with Dr Lamonte Sakai in 6 months.  Call sooner if you have any problems.  OSA (obstructive sleep apnea) She has not been using her CPAP because nocturnal cough is precluding it.  I do believe she benefit from getting back on to soon as possible.  We talked about trying to restart as her cough improves.  Baltazar Apo, MD, PhD 03/03/2021, 5:07 PM [facility] [phone] or if no answer [phone]

## 2021-03-03 NOTE — Assessment & Plan Note (Signed)
This is longstanding.  Difficult to tell exactly how much we have impacted it with more aggressive treatment of her rhinitis, GERD.  She does describe some improvement although it seems to be intermittent.  She still sometimes has posttussive emesis.  I will increase her PPI, outlined the other medications that we want her to continue including aggressive rhinitis regimen.  Stop losartan.  He do not need to take this medication anymore. Continue irbesartan 150 mg once daily. Stay off the fish oil Increase your pantoprazole (Protonix) to 40 mg twice a day.  Take this medication 1 hour around food. Continue loratadine 10 mg once daily. Continue your fluticasone nasal spray, 2 sprays each nostril once daily.  Try not to take this medication right before bedtime or else it would drain to your throat and possibly irritate your coughing. Use chlorpheniramine (chlor tabs) 4 mg each evening. Follow with APP in 2 months and with Dr Lamonte Sakai in 6 months.  Call sooner if you have any problems.

## 2021-03-05 ENCOUNTER — Telehealth: Payer: Self-pay | Admitting: Psychiatry
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
## 2021-03-06 ENCOUNTER — Inpatient Hospital Stay (HOSPITAL_COMMUNITY): Payer: PPO

## 2021-03-06 ENCOUNTER — Ambulatory Visit (HOSPITAL_COMMUNITY)
Admission: EM | Admit: 2021-03-06 | Discharge: 2021-03-06 | Disposition: A | Discharge status: Home / Self Care | Payer: PPO | Attending: Student | Admitting: Student

## 2021-03-06 ENCOUNTER — Encounter (HOSPITAL_COMMUNITY): Payer: Self-pay

## 2021-03-06 ENCOUNTER — Inpatient Hospital Stay (HOSPITAL_COMMUNITY)
Admission: EM | Admit: 2021-03-06 | Discharge: 2021-03-10 | DRG: 682 | Disposition: A | Discharge status: Home / Self Care | Payer: PPO | Attending: Family Medicine | Admitting: Family Medicine

## 2021-03-06 ENCOUNTER — Other Ambulatory Visit: Payer: Self-pay

## 2021-03-06 ENCOUNTER — Emergency Department (HOSPITAL_COMMUNITY): Payer: PPO

## 2021-03-06 DIAGNOSIS — E785 Hyperlipidemia, unspecified: Secondary | ICD-10-CM | POA: Diagnosis not present

## 2021-03-06 DIAGNOSIS — Z8674 Personal history of sudden cardiac arrest: Secondary | ICD-10-CM

## 2021-03-06 DIAGNOSIS — E1122 Type 2 diabetes mellitus with diabetic chronic kidney disease: Secondary | ICD-10-CM | POA: Diagnosis not present

## 2021-03-06 DIAGNOSIS — Z83438 Family history of other disorder of lipoprotein metabolism and other lipidemia: Secondary | ICD-10-CM

## 2021-03-06 DIAGNOSIS — I6523 Occlusion and stenosis of bilateral carotid arteries: Secondary | ICD-10-CM | POA: Diagnosis present

## 2021-03-06 DIAGNOSIS — Z951 Presence of aortocoronary bypass graft: Secondary | ICD-10-CM

## 2021-03-06 DIAGNOSIS — Z79899 Other long term (current) drug therapy: Secondary | ICD-10-CM

## 2021-03-06 DIAGNOSIS — Z87442 Personal history of urinary calculi: Secondary | ICD-10-CM | POA: Diagnosis not present

## 2021-03-06 DIAGNOSIS — I252 Old myocardial infarction: Secondary | ICD-10-CM | POA: Diagnosis not present

## 2021-03-06 DIAGNOSIS — Z20822 Contact with and (suspected) exposure to covid-19: Secondary | ICD-10-CM | POA: Diagnosis present

## 2021-03-06 DIAGNOSIS — Z833 Family history of diabetes mellitus: Secondary | ICD-10-CM

## 2021-03-06 DIAGNOSIS — E1151 Type 2 diabetes mellitus with diabetic peripheral angiopathy without gangrene: Secondary | ICD-10-CM | POA: Diagnosis not present

## 2021-03-06 DIAGNOSIS — G4730 Sleep apnea, unspecified: Secondary | ICD-10-CM | POA: Diagnosis present

## 2021-03-06 DIAGNOSIS — R059 Cough, unspecified: Secondary | ICD-10-CM

## 2021-03-06 DIAGNOSIS — F0390 Unspecified dementia without behavioral disturbance: Secondary | ICD-10-CM | POA: Diagnosis not present

## 2021-03-06 DIAGNOSIS — A419 Sepsis, unspecified organism: Secondary | ICD-10-CM | POA: Diagnosis not present

## 2021-03-06 DIAGNOSIS — N390 Urinary tract infection, site not specified: Secondary | ICD-10-CM

## 2021-03-06 DIAGNOSIS — I2510 Atherosclerotic heart disease of native coronary artery without angina pectoris: Secondary | ICD-10-CM | POA: Diagnosis present

## 2021-03-06 DIAGNOSIS — E860 Dehydration: Secondary | ICD-10-CM | POA: Diagnosis present

## 2021-03-06 DIAGNOSIS — I959 Hypotension, unspecified: Secondary | ICD-10-CM | POA: Diagnosis present

## 2021-03-06 DIAGNOSIS — D509 Iron deficiency anemia, unspecified: Secondary | ICD-10-CM | POA: Diagnosis present

## 2021-03-06 DIAGNOSIS — Z91048 Other nonmedicinal substance allergy status: Secondary | ICD-10-CM | POA: Diagnosis not present

## 2021-03-06 DIAGNOSIS — Z86718 Personal history of other venous thrombosis and embolism: Secondary | ICD-10-CM

## 2021-03-06 DIAGNOSIS — Z1619 Resistance to other specified beta lactam antibiotics: Secondary | ICD-10-CM | POA: Diagnosis present

## 2021-03-06 DIAGNOSIS — F410 Panic disorder [episodic paroxysmal anxiety] without agoraphobia: Secondary | ICD-10-CM | POA: Diagnosis not present

## 2021-03-06 DIAGNOSIS — Z955 Presence of coronary angioplasty implant and graft: Secondary | ICD-10-CM

## 2021-03-06 DIAGNOSIS — Z7982 Long term (current) use of aspirin: Secondary | ICD-10-CM

## 2021-03-06 DIAGNOSIS — Z9049 Acquired absence of other specified parts of digestive tract: Secondary | ICD-10-CM

## 2021-03-06 DIAGNOSIS — Z7984 Long term (current) use of oral hypoglycemic drugs: Secondary | ICD-10-CM

## 2021-03-06 DIAGNOSIS — Z8673 Personal history of transient ischemic attack (TIA), and cerebral infarction without residual deficits: Secondary | ICD-10-CM

## 2021-03-06 DIAGNOSIS — R509 Fever, unspecified: Secondary | ICD-10-CM | POA: Diagnosis not present

## 2021-03-06 DIAGNOSIS — N210 Calculus in bladder: Secondary | ICD-10-CM | POA: Diagnosis not present

## 2021-03-06 DIAGNOSIS — F419 Anxiety disorder, unspecified: Secondary | ICD-10-CM | POA: Diagnosis present

## 2021-03-06 DIAGNOSIS — I129 Hypertensive chronic kidney disease with stage 1 through stage 4 chronic kidney disease, or unspecified chronic kidney disease: Secondary | ICD-10-CM | POA: Diagnosis not present

## 2021-03-06 DIAGNOSIS — R197 Diarrhea, unspecified: Secondary | ICD-10-CM | POA: Diagnosis present

## 2021-03-06 DIAGNOSIS — B961 Klebsiella pneumoniae [K. pneumoniae] as the cause of diseases classified elsewhere: Secondary | ICD-10-CM | POA: Diagnosis present

## 2021-03-06 DIAGNOSIS — E872 Acidosis: Secondary | ICD-10-CM | POA: Diagnosis not present

## 2021-03-06 DIAGNOSIS — F325 Major depressive disorder, single episode, in full remission: Secondary | ICD-10-CM | POA: Diagnosis not present

## 2021-03-06 DIAGNOSIS — N179 Acute kidney failure, unspecified: Secondary | ICD-10-CM | POA: Diagnosis not present

## 2021-03-06 DIAGNOSIS — Z8249 Family history of ischemic heart disease and other diseases of the circulatory system: Secondary | ICD-10-CM

## 2021-03-06 DIAGNOSIS — D631 Anemia in chronic kidney disease: Secondary | ICD-10-CM | POA: Diagnosis present

## 2021-03-06 DIAGNOSIS — F411 Generalized anxiety disorder: Secondary | ICD-10-CM

## 2021-03-06 DIAGNOSIS — I700 Atherosclerosis of aorta: Secondary | ICD-10-CM | POA: Diagnosis not present

## 2021-03-06 DIAGNOSIS — Z79890 Hormone replacement therapy (postmenopausal): Secondary | ICD-10-CM

## 2021-03-06 DIAGNOSIS — N182 Chronic kidney disease, stage 2 (mild): Secondary | ICD-10-CM | POA: Diagnosis present

## 2021-03-06 DIAGNOSIS — G4700 Insomnia, unspecified: Secondary | ICD-10-CM | POA: Diagnosis present

## 2021-03-06 DIAGNOSIS — K5750 Diverticulosis of both small and large intestine without perforation or abscess without bleeding: Secondary | ICD-10-CM | POA: Diagnosis not present

## 2021-03-06 DIAGNOSIS — R0602 Shortness of breath: Secondary | ICD-10-CM | POA: Diagnosis not present

## 2021-03-06 DIAGNOSIS — E7800 Pure hypercholesterolemia, unspecified: Secondary | ICD-10-CM | POA: Diagnosis present

## 2021-03-06 DIAGNOSIS — Z981 Arthrodesis status: Secondary | ICD-10-CM

## 2021-03-06 DIAGNOSIS — Z87891 Personal history of nicotine dependence: Secondary | ICD-10-CM

## 2021-03-06 LAB — COMPREHENSIVE METABOLIC PANEL
ALT: 35 U/L (ref 0–44)
AST: 30 U/L (ref 15–41)
Albumin: 3 g/dL — ABNORMAL LOW (ref 3.5–5.0)
Alkaline Phosphatase: 76 U/L (ref 38–126)
Anion gap: 11 (ref 5–15)
BUN: 59 mg/dL — ABNORMAL HIGH (ref 8–23)
CO2: 17 mmol/L — ABNORMAL LOW (ref 22–32)
Calcium: 9 mg/dL (ref 8.9–10.3)
Chloride: 105 mmol/L (ref 98–111)
Creatinine, Ser: 2.3 mg/dL — ABNORMAL HIGH (ref 0.44–1.00)
GFR, Estimated: 23 mL/min — ABNORMAL LOW (ref >60)
Glucose, Bld: 189 mg/dL — ABNORMAL HIGH (ref 70–99)
Potassium: 4.8 mmol/L (ref 3.5–5.1)
Sodium: 133 mmol/L — ABNORMAL LOW (ref 135–145)
Total Bilirubin: 0.7 mg/dL (ref 0.3–1.2)
Total Protein: 7.1 g/dL (ref 6.5–8.1)

## 2021-03-06 LAB — IRON AND TIBC
Iron: 9 ug/dL — ABNORMAL LOW (ref 28–170)
Saturation Ratios: 3 % — ABNORMAL LOW (ref 10.4–31.8)
TIBC: 307 ug/dL (ref 250–450)
UIBC: 298 ug/dL

## 2021-03-06 LAB — HIV ANTIBODY (ROUTINE TESTING W REFLEX): HIV Screen 4th Generation wRfx: NONREACTIVE

## 2021-03-06 LAB — CBC
HCT: 30.1 % — ABNORMAL LOW (ref 36.0–46.0)
Hemoglobin: 9.4 g/dL — ABNORMAL LOW (ref 12.0–15.0)
MCH: 29.1 pg (ref 26.0–34.0)
MCHC: 31.2 g/dL (ref 30.0–36.0)
MCV: 93.2 fL (ref 80.0–100.0)
Platelets: 368 10*3/uL (ref 150–400)
RBC: 3.23 MIL/uL — ABNORMAL LOW (ref 3.87–5.11)
RDW: 13.7 % (ref 11.5–15.5)
WBC: 12.9 10*3/uL — ABNORMAL HIGH (ref 4.0–10.5)
nRBC: 0 % (ref 0.0–0.2)

## 2021-03-06 LAB — URINALYSIS, ROUTINE W REFLEX MICROSCOPIC
Bilirubin Urine: NEGATIVE
Glucose, UA: NEGATIVE mg/dL
Ketones, ur: NEGATIVE mg/dL
Nitrite: NEGATIVE
Protein, ur: 30 mg/dL — AB
Specific Gravity, Urine: 1.014 (ref 1.005–1.030)
pH: 5 (ref 5.0–8.0)

## 2021-03-06 LAB — RAPID URINE DRUG SCREEN, HOSP PERFORMED
Amphetamines: NOT DETECTED
Barbiturates: NOT DETECTED
Benzodiazepines: NOT DETECTED
Cocaine: NOT DETECTED
Opiates: POSITIVE — AB
Tetrahydrocannabinol: NOT DETECTED

## 2021-03-06 LAB — RESP PANEL BY RT-PCR (FLU A&B, COVID) ARPGX2
Influenza A by PCR: NEGATIVE
Influenza B by PCR: NEGATIVE
SARS Coronavirus 2 by RT PCR: NEGATIVE

## 2021-03-06 LAB — ACETAMINOPHEN LEVEL: Acetaminophen (Tylenol), Serum: <10 ug/mL — ABNORMAL LOW (ref 10–30)

## 2021-03-06 LAB — GLUCOSE, CAPILLARY: Glucose-Capillary: 111 mg/dL — ABNORMAL HIGH (ref 70–99)

## 2021-03-06 LAB — FERRITIN: Ferritin: 75 ng/mL (ref 11–307)

## 2021-03-06 LAB — ETHANOL: Alcohol, Ethyl (B): <10 mg/dL (ref <10)

## 2021-03-06 LAB — TSH: TSH: 0.56 u[IU]/mL (ref 0.350–4.500)

## 2021-03-06 LAB — LACTIC ACID, PLASMA
Lactic Acid, Venous: 1.5 mmol/L (ref 0.5–1.9)
Lactic Acid, Venous: 1.3 mmol/L (ref 0.5–1.9)

## 2021-03-06 LAB — SALICYLATE LEVEL: Salicylate Lvl: <7 mg/dL — ABNORMAL LOW (ref 7.0–30.0)

## 2021-03-06 LAB — SODIUM, URINE, RANDOM: Sodium, Ur: 72 mmol/L

## 2021-03-06 LAB — CREATININE, URINE, RANDOM: Creatinine, Urine: 80.41 mg/dL

## 2021-03-06 MED ORDER — VITAMIN D 25 MCG (1000 UNIT) PO TABS
1000.0000 [IU] | ORAL_TABLET | Freq: Every day | ORAL | Status: DC
  Administered 2021-03-07 – 2021-03-09 (×3): 1000 [IU] via ORAL
  Filled 2021-03-06 (×3): qty 1

## 2021-03-06 MED ORDER — LORATADINE 10 MG PO TABS
10.0000 mg | ORAL_TABLET | Freq: Every day | ORAL | Status: DC
  Administered 2021-03-07 – 2021-03-09 (×3): 10 mg via ORAL
  Filled 2021-03-06 (×3): qty 1

## 2021-03-06 MED ORDER — CILOSTAZOL 100 MG PO TABS
100.0000 mg | ORAL_TABLET | Freq: Two times a day (BID) | ORAL | Status: DC
  Administered 2021-03-06 – 2021-03-10 (×8): 100 mg via ORAL
  Filled 2021-03-06 (×8): qty 1

## 2021-03-06 MED ORDER — ACETAMINOPHEN 650 MG RE SUPP: 650.0000 mg | Freq: Four times a day (QID) | RECTAL | Status: DC | PRN

## 2021-03-06 MED ORDER — COENZYME Q10 300 MG PO CAPS: 1.0000 | ORAL_CAPSULE | Freq: Every day | ORAL | Status: DC

## 2021-03-06 MED ORDER — ADULT MULTIVITAMIN W/MINERALS CH
1.0000 | ORAL_TABLET | Freq: Every day | ORAL | Status: DC
  Administered 2021-03-07 – 2021-03-09 (×3): 1 via ORAL
  Filled 2021-03-06 (×3): qty 1

## 2021-03-06 MED ORDER — RISAQUAD PO CAPS
2.0000 | ORAL_CAPSULE | Freq: Three times a day (TID) | ORAL | Status: DC
  Administered 2021-03-06 – 2021-03-09 (×10): 2 via ORAL
  Filled 2021-03-06 (×11): qty 2

## 2021-03-06 MED ORDER — SERTRALINE HCL 50 MG PO TABS
150.0000 mg | ORAL_TABLET | Freq: Every day | ORAL | Status: DC
  Administered 2021-03-07 – 2021-03-09 (×3): 150 mg via ORAL
  Filled 2021-03-06 (×3): qty 1

## 2021-03-06 MED ORDER — ATORVASTATIN CALCIUM 40 MG PO TABS
80.0000 mg | ORAL_TABLET | Freq: Every day | ORAL | Status: DC
  Administered 2021-03-06 – 2021-03-09 (×4): 80 mg via ORAL
  Filled 2021-03-06 (×4): qty 2

## 2021-03-06 MED ORDER — DIPHENHYDRAMINE HCL 25 MG PO TABS: 25.0000 mg | ORAL_TABLET | Freq: Four times a day (QID) | ORAL | Status: DC

## 2021-03-06 MED ORDER — SODIUM BICARBONATE 650 MG PO TABS
650.0000 mg | ORAL_TABLET | Freq: Two times a day (BID) | ORAL | Status: DC
  Administered 2021-03-06 – 2021-03-09 (×7): 650 mg via ORAL
  Filled 2021-03-06 (×8): qty 1

## 2021-03-06 MED ORDER — DIAZEPAM 5 MG PO TABS
5.0000 mg | ORAL_TABLET | Freq: Once | ORAL | Status: AC
  Administered 2021-03-06: 5 mg via ORAL
  Filled 2021-03-06: qty 1

## 2021-03-06 MED ORDER — HEPARIN SODIUM (PORCINE) 5000 UNIT/ML IJ SOLN
5000.0000 [IU] | Freq: Three times a day (TID) | INTRAMUSCULAR | Status: DC
  Administered 2021-03-06 – 2021-03-10 (×11): 5000 [IU] via SUBCUTANEOUS
  Filled 2021-03-06 (×10): qty 1

## 2021-03-06 MED ORDER — ASCORBIC ACID 500 MG PO TABS
500.0000 mg | ORAL_TABLET | Freq: Every day | ORAL | Status: DC
  Administered 2021-03-07 – 2021-03-09 (×3): 500 mg via ORAL
  Filled 2021-03-06 (×3): qty 1

## 2021-03-06 MED ORDER — ASPIRIN EC 81 MG PO TBEC
81.0000 mg | DELAYED_RELEASE_TABLET | Freq: Every day | ORAL | Status: DC
  Administered 2021-03-07 – 2021-03-09 (×3): 81 mg via ORAL
  Filled 2021-03-06 (×3): qty 1

## 2021-03-06 MED ORDER — ONDANSETRON HCL 4 MG/2ML IJ SOLN: 4.0000 mg | Freq: Four times a day (QID) | INTRAMUSCULAR | Status: DC | PRN

## 2021-03-06 MED ORDER — ACETAMINOPHEN 325 MG PO TABS
650.0000 mg | ORAL_TABLET | Freq: Four times a day (QID) | ORAL | Status: DC | PRN
  Administered 2021-03-07 – 2021-03-09 (×3): 650 mg via ORAL
  Filled 2021-03-06 (×3): qty 2

## 2021-03-06 MED ORDER — GATIFLOXACIN 0.5 % OP SOLN: 1.0000 [drp] | Freq: Four times a day (QID) | OPHTHALMIC | Status: DC

## 2021-03-06 MED ORDER — INSULIN ASPART 100 UNIT/ML IJ SOLN
0.0000 [IU] | Freq: Three times a day (TID) | INTRAMUSCULAR | Status: DC
  Administered 2021-03-07 (×2): 1 [IU] via SUBCUTANEOUS
  Administered 2021-03-08 (×2): 2 [IU] via SUBCUTANEOUS
  Administered 2021-03-09: 1 [IU] via SUBCUTANEOUS
  Administered 2021-03-09 – 2021-03-10 (×3): 2 [IU] via SUBCUTANEOUS

## 2021-03-06 MED ORDER — ONDANSETRON HCL 4 MG PO TABS: 4.0000 mg | ORAL_TABLET | Freq: Four times a day (QID) | ORAL | Status: DC | PRN

## 2021-03-06 MED ORDER — METOPROLOL TARTRATE 25 MG PO TABS
25.0000 mg | ORAL_TABLET | Freq: Two times a day (BID) | ORAL | Status: DC
  Administered 2021-03-06 – 2021-03-09 (×7): 25 mg via ORAL
  Filled 2021-03-06 (×7): qty 1

## 2021-03-06 MED ORDER — FLUTICASONE PROPIONATE 50 MCG/ACT NA SUSP
1.0000 | Freq: Every day | NASAL | Status: DC
  Administered 2021-03-07 – 2021-03-09 (×3): 1 via NASAL
  Filled 2021-03-06: qty 16

## 2021-03-06 MED ORDER — MELATONIN 3 MG PO TABS
3.0000 mg | ORAL_TABLET | Freq: Every day | ORAL | Status: DC
  Administered 2021-03-06 – 2021-03-09 (×4): 3 mg via ORAL
  Filled 2021-03-06 (×4): qty 1

## 2021-03-06 MED ORDER — SODIUM CHLORIDE 0.9 % IV SOLN: INTRAVENOUS | Status: AC

## 2021-03-06 MED ORDER — LACTATED RINGERS IV BOLUS
1000.0000 mL | Freq: Once | INTRAVENOUS | Status: AC
  Administered 2021-03-06: 1000 mL via INTRAVENOUS

## 2021-03-06 MED ORDER — ESTRADIOL 0.1 MG/GM VA CREA
0.5000 g | TOPICAL_CREAM | VAGINAL | Status: DC
  Filled 2021-03-06: qty 42.5

## 2021-03-06 MED ORDER — PANTOPRAZOLE SODIUM 40 MG PO TBEC
40.0000 mg | DELAYED_RELEASE_TABLET | Freq: Two times a day (BID) | ORAL | Status: DC
  Administered 2021-03-06 – 2021-03-09 (×7): 40 mg via ORAL
  Filled 2021-03-06 (×7): qty 1
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
## 2021-03-06 NOTE — ED Notes (Signed)
Attempted report x1. 

## 2021-03-06 NOTE — Progress Notes (Signed)
Patient room is ready. Called ER RN for report.
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
## 2021-03-06 NOTE — ED Notes (Signed)
Pt daughter want updates   Alicia Crawford [phone]   Lives 10 mins away

## 2021-03-06 NOTE — Discharge Instructions (Signed)
-  Head straight to Alicia Crawford ED for further evaluation and management of anxiety and hypotension. With your cardiac history, I want a more through cardiac workup than we can perform here to rule out an acute issue.  -If you experience new symptoms while on the way like dizziness, shortness of breath, weakness- stop and call 911 immediately.

## 2021-03-06 NOTE — Telephone Encounter (Signed)
I'm just reviewing message but pull up her ED visit from today 03/06/2021
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
## 2021-03-06 NOTE — ED Notes (Signed)
Pt transported to [facility]
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
## 2021-03-06 NOTE — Plan of Care (Signed)
  Problem: Education: Goal: Knowledge of General Education information will improve Description: Including pain rating scale, medication(s)/side effects and non-pharmacologic comfort measures Outcome: Completed/Met

## 2021-03-07 ENCOUNTER — Encounter (HOSPITAL_COMMUNITY): Payer: Self-pay | Admitting: Internal Medicine

## 2021-03-07 ENCOUNTER — Other Ambulatory Visit: Payer: Self-pay

## 2021-03-07 LAB — BASIC METABOLIC PANEL
Anion gap: 9 (ref 5–15)
BUN: 51 mg/dL — ABNORMAL HIGH (ref 8–23)
CO2: 17 mmol/L — ABNORMAL LOW (ref 22–32)
Calcium: 8.2 mg/dL — ABNORMAL LOW (ref 8.9–10.3)
Chloride: 108 mmol/L (ref 98–111)
Creatinine, Ser: 1.8 mg/dL — ABNORMAL HIGH (ref 0.44–1.00)
GFR, Estimated: 30 mL/min — ABNORMAL LOW (ref >60)
Glucose, Bld: 119 mg/dL — ABNORMAL HIGH (ref 70–99)
Potassium: 4.8 mmol/L (ref 3.5–5.1)
Sodium: 134 mmol/L — ABNORMAL LOW (ref 135–145)

## 2021-03-07 LAB — GLUCOSE, CAPILLARY
Glucose-Capillary: 99 mg/dL (ref 70–99)
Glucose-Capillary: 133 mg/dL — ABNORMAL HIGH (ref 70–99)
Glucose-Capillary: 128 mg/dL — ABNORMAL HIGH (ref 70–99)

## 2021-03-07 LAB — CBC
HCT: 24.8 % — ABNORMAL LOW (ref 36.0–46.0)
Hemoglobin: 8.2 g/dL — ABNORMAL LOW (ref 12.0–15.0)
MCH: 29.9 pg (ref 26.0–34.0)
MCHC: 33.1 g/dL (ref 30.0–36.0)
MCV: 90.5 fL (ref 80.0–100.0)
Platelets: 321 10*3/uL (ref 150–400)
RBC: 2.74 MIL/uL — ABNORMAL LOW (ref 3.87–5.11)
RDW: 13.8 % (ref 11.5–15.5)
WBC: 12.5 10*3/uL — ABNORMAL HIGH (ref 4.0–10.5)
nRBC: 0 % (ref 0.0–0.2)

## 2021-03-07 MED ORDER — SODIUM CHLORIDE 0.9 % IV SOLN
1.0000 g | INTRAVENOUS | Status: DC
  Administered 2021-03-07 – 2021-03-09 (×3): 1 g via INTRAVENOUS
  Filled 2021-03-07 (×4): qty 10

## 2021-03-07 MED ORDER — ALPRAZOLAM 0.25 MG PO TABS
0.2500 mg | ORAL_TABLET | Freq: Two times a day (BID) | ORAL | Status: DC | PRN
  Administered 2021-03-07 – 2021-03-09 (×3): 0.25 mg via ORAL
  Filled 2021-03-07 (×3): qty 1

## 2021-03-07 NOTE — Progress Notes (Signed)
HOSPITAL MEDICINE OVERNIGHT EVENT NOTE    Notified by nursing the patient is exhibiting a fever this evening of 101.7 F.  Patient currently denies dysuria.  Chart reviewed, patient is currently being hospitalized and managed for acute kidney injury.  Patient did have a urinalysis performed on 6/4.  This urinalysis was somewhat equivocal with an elevated white blood cell count per high-powered field and positive leukocyte esterase.  Will obtain blood cultures, urine cultures.  Will initiate intravenous ceftriaxone for suspected urinary tract infection.  Daytime provider can decide whether to continue this at their discretion based on culture results.  Vernelle Emerald  MD [facility]

## 2021-03-07 NOTE — Progress Notes (Signed)
RN unable to obtain stool sample as patient had an incontinent bowel movement.

## 2021-03-07 NOTE — Progress Notes (Signed)
RN was unable to obtain a stool sample for this patient as the patient reported that her husband assisted he to the bathroom, she had a bowel movement and flushed it down the toilet without notifying staff. RN educated patient again about needing to obtain stool sample to send for GI panel testing. RN will continue to monitor the staff.
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
## 2021-03-08 LAB — BASIC METABOLIC PANEL
Anion gap: 9 (ref 5–15)
BUN: 23 mg/dL (ref 8–23)
CO2: 20 mmol/L — ABNORMAL LOW (ref 22–32)
Calcium: 8.6 mg/dL — ABNORMAL LOW (ref 8.9–10.3)
Chloride: 109 mmol/L (ref 98–111)
Creatinine, Ser: 1.13 mg/dL — ABNORMAL HIGH (ref 0.44–1.00)
GFR, Estimated: 53 mL/min — ABNORMAL LOW (ref >60)
Glucose, Bld: 127 mg/dL — ABNORMAL HIGH (ref 70–99)
Potassium: 4.6 mmol/L (ref 3.5–5.1)
Sodium: 138 mmol/L (ref 135–145)

## 2021-03-08 LAB — CBC
HCT: 27 % — ABNORMAL LOW (ref 36.0–46.0)
Hemoglobin: 8.9 g/dL — ABNORMAL LOW (ref 12.0–15.0)
MCH: 29.8 pg (ref 26.0–34.0)
MCHC: 33 g/dL (ref 30.0–36.0)
MCV: 90.3 fL (ref 80.0–100.0)
Platelets: 381 10*3/uL (ref 150–400)
RBC: 2.99 MIL/uL — ABNORMAL LOW (ref 3.87–5.11)
RDW: 13.9 % (ref 11.5–15.5)
WBC: 12.4 10*3/uL — ABNORMAL HIGH (ref 4.0–10.5)
nRBC: 0 % (ref 0.0–0.2)

## 2021-03-08 LAB — PROCALCITONIN: Procalcitonin: 0.37 ng/mL

## 2021-03-08 LAB — GLUCOSE, CAPILLARY
Glucose-Capillary: 119 mg/dL — ABNORMAL HIGH (ref 70–99)
Glucose-Capillary: 164 mg/dL — ABNORMAL HIGH (ref 70–99)
Glucose-Capillary: 161 mg/dL — ABNORMAL HIGH (ref 70–99)

## 2021-03-08 LAB — HEMOGLOBIN A1C
Hgb A1c MFr Bld: 6.1 % — ABNORMAL HIGH (ref 4.8–5.6)
Mean Plasma Glucose: 128 mg/dL
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
## 2021-03-08 NOTE — Plan of Care (Signed)
  Problem: Health Behavior/Discharge Planning: ?Goal: Ability to manage health-related needs will improve ?Outcome: Completed/Met ?  ?Problem: Clinical Measurements: ?Goal: Diagnostic test results will improve ?Outcome: Completed/Met ?  ?
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
## 2021-03-08 NOTE — Telephone Encounter (Signed)
Pt remains in hospital for acute kidney injury and is noted to have a fever. Will follow-up with pt after hospital discharge.

## 2021-03-09 ENCOUNTER — Inpatient Hospital Stay (HOSPITAL_COMMUNITY): Payer: PPO

## 2021-03-09 LAB — CBC WITH DIFFERENTIAL/PLATELET
Abs Immature Granulocytes: 0.12 10*3/uL — ABNORMAL HIGH (ref 0.00–0.07)
Basophils Absolute: 0 10*3/uL (ref 0.0–0.1)
Basophils Relative: 0 %
Eosinophils Absolute: 0.2 10*3/uL (ref 0.0–0.5)
Eosinophils Relative: 1 %
HCT: 26.4 % — ABNORMAL LOW (ref 36.0–46.0)
Hemoglobin: 8.6 g/dL — ABNORMAL LOW (ref 12.0–15.0)
Immature Granulocytes: 1 %
Lymphocytes Relative: 19 %
Lymphs Abs: 2.3 10*3/uL (ref 0.7–4.0)
MCH: 29.5 pg (ref 26.0–34.0)
MCHC: 32.6 g/dL (ref 30.0–36.0)
MCV: 90.4 fL (ref 80.0–100.0)
Monocytes Absolute: 1.3 10*3/uL — ABNORMAL HIGH (ref 0.1–1.0)
Monocytes Relative: 11 %
Neutro Abs: 8 10*3/uL — ABNORMAL HIGH (ref 1.7–7.7)
Neutrophils Relative %: 68 %
Platelets: 372 10*3/uL (ref 150–400)
RBC: 2.92 MIL/uL — ABNORMAL LOW (ref 3.87–5.11)
RDW: 13.8 % (ref 11.5–15.5)
WBC: 11.8 10*3/uL — ABNORMAL HIGH (ref 4.0–10.5)
nRBC: 0 % (ref 0.0–0.2)

## 2021-03-09 LAB — BASIC METABOLIC PANEL
Anion gap: 9 (ref 5–15)
BUN: 16 mg/dL (ref 8–23)
CO2: 21 mmol/L — ABNORMAL LOW (ref 22–32)
Calcium: 8.5 mg/dL — ABNORMAL LOW (ref 8.9–10.3)
Chloride: 105 mmol/L (ref 98–111)
Creatinine, Ser: 0.99 mg/dL (ref 0.44–1.00)
GFR, Estimated: >60 mL/min (ref >60)
Glucose, Bld: 226 mg/dL — ABNORMAL HIGH (ref 70–99)
Potassium: 3.9 mmol/L (ref 3.5–5.1)
Sodium: 135 mmol/L (ref 135–145)

## 2021-03-09 LAB — RESPIRATORY PANEL BY PCR

## 2021-03-09 LAB — GLUCOSE, CAPILLARY
Glucose-Capillary: 125 mg/dL — ABNORMAL HIGH (ref 70–99)
Glucose-Capillary: 194 mg/dL — ABNORMAL HIGH (ref 70–99)
Glucose-Capillary: 155 mg/dL — ABNORMAL HIGH (ref 70–99)

## 2021-03-09 LAB — MAGNESIUM: Magnesium: 1.3 mg/dL — ABNORMAL LOW (ref 1.7–2.4)

## 2021-03-09 MED ORDER — MAGNESIUM SULFATE 4 GM/100ML IV SOLN
4.0000 g | Freq: Once | INTRAVENOUS | Status: AC
  Administered 2021-03-09: 4 g via INTRAVENOUS
  Filled 2021-03-09: qty 100

## 2021-03-09 MED ORDER — PHENOL 1.4 % MT LIQD: 1.0000 | OROMUCOSAL | Status: DC | PRN
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
## 2021-03-09 NOTE — Telephone Encounter (Signed)
Pt remains in hospital at this time.
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
## 2021-03-09 NOTE — Plan of Care (Signed)
  Problem: Clinical Measurements: Goal: Will remain free from infection Outcome: Progressing   Problem: Activity: Goal: Risk for activity intolerance will decrease Outcome: Progressing   Problem: Nutrition: Goal: Adequate nutrition will be maintained Outcome: Progressing   Problem: Coping: Goal: Level of anxiety will decrease Outcome: Progressing   Problem: Pain Managment: Goal: General experience of comfort will improve Outcome: Progressing   

## 2021-03-10 ENCOUNTER — Ambulatory Visit (INDEPENDENT_AMBULATORY_CARE_PROVIDER_SITE_OTHER): Payer: PPO | Admitting: Psychiatry

## 2021-03-10 ENCOUNTER — Other Ambulatory Visit: Payer: Self-pay

## 2021-03-10 DIAGNOSIS — F410 Panic disorder [episodic paroxysmal anxiety] without agoraphobia: Secondary | ICD-10-CM | POA: Diagnosis not present

## 2021-03-10 DIAGNOSIS — F325 Major depressive disorder, single episode, in full remission: Secondary | ICD-10-CM | POA: Diagnosis not present

## 2021-03-10 DIAGNOSIS — N390 Urinary tract infection, site not specified: Secondary | ICD-10-CM

## 2021-03-10 DIAGNOSIS — A419 Sepsis, unspecified organism: Secondary | ICD-10-CM

## 2021-03-10 LAB — CBC WITH DIFFERENTIAL/PLATELET
Abs Immature Granulocytes: 0.21 10*3/uL — ABNORMAL HIGH (ref 0.00–0.07)
Basophils Absolute: 0.1 10*3/uL (ref 0.0–0.1)
Basophils Relative: 1 %
Eosinophils Absolute: 0.2 10*3/uL (ref 0.0–0.5)
Eosinophils Relative: 2 %
HCT: 25.5 % — ABNORMAL LOW (ref 36.0–46.0)
Hemoglobin: 8.5 g/dL — ABNORMAL LOW (ref 12.0–15.0)
Immature Granulocytes: 2 %
Lymphocytes Relative: 20 %
Lymphs Abs: 2 10*3/uL (ref 0.7–4.0)
MCH: 29.8 pg (ref 26.0–34.0)
MCHC: 33.3 g/dL (ref 30.0–36.0)
MCV: 89.5 fL (ref 80.0–100.0)
Monocytes Absolute: 1 10*3/uL (ref 0.1–1.0)
Monocytes Relative: 10 %
Neutro Abs: 6.5 10*3/uL (ref 1.7–7.7)
Neutrophils Relative %: 65 %
Platelets: 421 10*3/uL — ABNORMAL HIGH (ref 150–400)
RBC: 2.85 MIL/uL — ABNORMAL LOW (ref 3.87–5.11)
RDW: 13.7 % (ref 11.5–15.5)
WBC: 9.9 10*3/uL (ref 4.0–10.5)
nRBC: 0 % (ref 0.0–0.2)

## 2021-03-10 LAB — GLUCOSE, CAPILLARY: Glucose-Capillary: 159 mg/dL — ABNORMAL HIGH (ref 70–99)

## 2021-03-10 LAB — URINE CULTURE: Culture: >=100000 — AB

## 2021-03-10 MED ORDER — SERTRALINE HCL 100 MG PO TABS: 200.0000 mg | ORAL_TABLET | Freq: Every day | ORAL | 0 refills | Status: DC

## 2021-03-10 MED ORDER — CEPHALEXIN 500 MG PO CAPS: 500.0000 mg | ORAL_CAPSULE | Freq: Two times a day (BID) | ORAL | 0 refills | Status: AC

## 2021-03-10 MED ORDER — ALPRAZOLAM 0.5 MG PO TABS: ORAL_TABLET | ORAL | 1 refills | Status: DC

## 2021-03-10 NOTE — Discharge Summary (Signed)
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 360bbb-3(b)(1), unless the authorization is terminated or revoked.  Performed at [facility], [place] [address] [place], [place] [address]   Culture, Urine     Status: Abnormal   Collection Time: 03/07/21 10:00 PM   Specimen: Urine, Random  Result Value Ref Range Status   Specimen Description URINE, RANDOM  Final   Special Requests   Final    NONE Performed at [facility], [place] [address] [place], [place] [address]    Culture >=100,000  COLONIES/mL KLEBSIELLA PNEUMONIAE (A)  Final   Report Status 03/10/2021 FINAL  Final   Organism ID, Bacteria KLEBSIELLA PNEUMONIAE (A)  Final      Susceptibility   Klebsiella pneumoniae - MIC*    AMPICILLIN >=32 RESISTANT Resistant     CEFAZOLIN 16 SENSITIVE Sensitive     CEFEPIME <=0.12 SENSITIVE Sensitive     CEFTRIAXONE <=0.25 SENSITIVE Sensitive     CIPROFLOXACIN <=0.25 SENSITIVE Sensitive     GENTAMICIN <=1 SENSITIVE Sensitive     IMIPENEM <=0.25 SENSITIVE Sensitive     NITROFURANTOIN 64 INTERMEDIATE Intermediate     TRIMETH/SULFA <=20 SENSITIVE Sensitive     AMPICILLIN/SULBACTAM >=32 RESISTANT Resistant     PIP/TAZO 32 INTERMEDIATE Intermediate     * >=100,000 COLONIES/mL KLEBSIELLA PNEUMONIAE  Culture, blood (routine x 2)     Status: None (Preliminary result)   Collection Time: 03/07/21 10:11 PM   Specimen: BLOOD  Result Value Ref Range Status   Specimen Description BLOOD LEFT ARM  Final   Special Requests   Final    BOTTLES DRAWN AEROBIC ONLY Blood Culture results may not be optimal due to an inadequate volume of blood received in culture bottles   Culture   Final    NO GROWTH 3 DAYS Performed at [place] [address], [place], [place] [address]    Report Status PENDING  Incomplete  Culture, blood (routine x 2)     Status: None (Preliminary result)   Collection Time: 03/07/21 10:18 PM   Specimen: BLOOD  Result Value Ref Range Status   Specimen Description BLOOD LEFT ARM  Final   Special Requests   Final    BOTTLES DRAWN AEROBIC ONLY Blood Culture results may not be optimal due to an inadequate volume of blood received in culture bottles   Culture   Final    NO GROWTH 3 DAYS Performed at [facility], [place] [address] [place], [place] [address]    Report Status PENDING  Incomplete  Respiratory (~20 pathogens) panel by PCR     Status: None   Collection Time: 03/09/21  8:01 AM   Specimen: Nasopharyngeal Swab; Respiratory  Result Value Ref Range  Status   Adenovirus NOT DETECTED NOT DETECTED Final   Coronavirus 229E NOT DETECTED NOT DETECTED Final    Comment: (NOTE) The Coronavirus on the Respiratory Panel, DOES NOT test for the novel  Coronavirus (2019 nCoV)    Coronavirus HKU1 NOT DETECTED NOT DETECTED Final   Coronavirus NL63 NOT DETECTED NOT DETECTED Final   Coronavirus OC43 NOT  DETECTED NOT DETECTED Final   Metapneumovirus NOT DETECTED NOT DETECTED Final   Rhinovirus / Enterovirus NOT DETECTED NOT DETECTED Final   Influenza A NOT DETECTED NOT DETECTED Final   Influenza B NOT DETECTED NOT DETECTED Final   Parainfluenza Virus 1 NOT DETECTED NOT DETECTED Final   Parainfluenza Virus 2 NOT DETECTED NOT DETECTED Final   Parainfluenza Virus 3 NOT DETECTED NOT DETECTED Final   Parainfluenza Virus 4 NOT DETECTED NOT DETECTED Final   Respiratory Syncytial Virus NOT DETECTED NOT DETECTED Final   Bordetella pertussis NOT DETECTED NOT DETECTED Final   Bordetella Parapertussis NOT DETECTED NOT DETECTED Final   Chlamydophila pneumoniae NOT DETECTED NOT DETECTED Final   Mycoplasma pneumoniae NOT DETECTED NOT DETECTED Final    Comment: Performed at [facility], [place] [address], [place], [place] [address]     Time coordinating discharge: Over 30 minutes  SIGNED:   Darliss Cheney, MD  [facility] 03/10/2021, 8:51 AM  If 7PM-7AM, please contact night-coverage [email or web address]

## 2021-03-10 NOTE — Plan of Care (Signed)
  Problem: Clinical Measurements: Goal: Ability to maintain clinical measurements within normal limits will improve Outcome: Completed/Met Goal: Will remain free from infection Outcome: Completed/Met Goal: Respiratory complications will improve Outcome: Completed/Met Goal: Cardiovascular complication will be avoided Outcome: Completed/Met   Problem: Activity: Goal: Risk for activity intolerance will decrease Outcome: Completed/Met   Problem: Nutrition: Goal: Adequate nutrition will be maintained Outcome: Completed/Met   Problem: Coping: Goal: Level of anxiety will decrease Outcome: Completed/Met   Problem: Elimination: Goal: Will not experience complications related to bowel motility Outcome: Completed/Met Goal: Will not experience complications related to urinary retention Outcome: Completed/Met   Problem: Pain Managment: Goal: General experience of comfort will improve Outcome: Completed/Met   Problem: Safety: Goal: Ability to remain free from injury will improve Outcome: Completed/Met   Problem: Skin Integrity: Goal: Risk for impaired skin integrity will decrease Outcome: Completed/Met

## 2021-03-10 NOTE — Discharge Instructions (Signed)
Urinary Tract Infection, Adult A urinary tract infection (UTI) is an infection of any part of the urinary tract. The urinary tract includes:  The kidneys.  The ureters.  The bladder.  The urethra. These organs make, store, and get rid of pee (urine) in the body. What are the causes? This infection is caused by germs (bacteria) in your genital area. These germs grow and cause swelling (inflammation) of your urinary tract. What increases the risk? The following factors may make you more likely to develop this condition:  Using a small, thin tube (catheter) to drain pee.  Not being able to control when you pee or poop (incontinence).  Being female. If you are female, these things can increase the risk: ? Using these methods to prevent pregnancy:  A medicine that kills sperm (spermicide).  A device that blocks sperm (diaphragm). ? Having low levels of a female hormone (estrogen). ? Being pregnant. You are more likely to develop this condition if:  You have genes that add to your risk.  You are sexually active.  You take antibiotic medicines.  You have trouble peeing because of: ? A prostate that is bigger than normal, if you are female. ? A blockage in the part of your body that drains pee from the bladder. ? A kidney stone. ? A nerve condition that affects your bladder. ? Not getting enough to drink. ? Not peeing often enough.  You have other conditions, such as: ? Diabetes. ? A weak disease-fighting system (immune system). ? Sickle cell disease. ? Gout. ? Injury of the spine. What are the signs or symptoms? Symptoms of this condition include:  Needing to pee right away.  Peeing small amounts often.  Pain or burning when peeing.  Blood in the pee.  Pee that smells bad or not like normal.  Trouble peeing.  Pee that is cloudy.  Fluid coming from the vagina, if you are female.  Pain in the belly or lower back. Other symptoms include:  Vomiting.  Not  feeling hungry.  Feeling mixed up (confused). This may be the first symptom in older adults.  Being tired and grouchy (irritable).  A fever.  Watery poop (diarrhea). How is this treated?  Taking antibiotic medicine.  Taking other medicines.  Drinking enough water. In some cases, you may need to see a specialist. Follow these instructions at home: Medicines  Take over-the-counter and prescription medicines only as told by your doctor.  If you were prescribed an antibiotic medicine, take it as told by your doctor. Do not stop taking it even if you start to feel better. General instructions  Make sure you: ? Pee until your bladder is empty. ? Do not hold pee for a long time. ? Empty your bladder after sex. ? Wipe from front to back after peeing or pooping if you are a female. Use each tissue one time when you wipe.  Drink enough fluid to keep your pee pale yellow.  Keep all follow-up visits.   Contact a doctor if:  You do not get better after 1-2 days.  Your symptoms go away and then come back. Get help right away if:  You have very bad back pain.  You have very bad pain in your lower belly.  You have a fever.  You have chills.  You feeling like you will vomit or you vomit. Summary  A urinary tract infection (UTI) is an infection of any part of the urinary tract.  This condition is caused by   germs in your genital area.  There are many risk factors for a UTI.  Treatment includes antibiotic medicines.  Drink enough fluid to keep your pee pale yellow. This information is not intended to replace advice given to you by your health care provider. Make sure you discuss any questions you have with your health care provider. Document Revised: 05/01/2020 Document Reviewed: 05/01/2020 Elsevier Patient Education  2021 Elsevier Inc.  

## 2021-03-10 NOTE — Progress Notes (Signed)
DISCHARGE NOTE HOME Alicia Crawford to be discharged Home per MD order. Discussed prescriptions and follow up appointments with the patient. Prescriptions given to patient; medication list explained in detail. Patient verbalized understanding.  Skin clean, dry and intact without evidence of skin break down, no evidence of skin tears noted. IV catheter discontinued intact. Site without signs and symptoms of complications. Dressing and pressure applied. Pt denies pain at the site currently. No complaints noted.  Patient free of lines, drains, and wounds.   An After Visit Summary (AVS) was printed and given to the patient. Patient escorted via wheelchair, and discharged home via private auto.  Vira Agar, RN

## 2021-03-10 NOTE — Telephone Encounter (Signed)
Pt seen in office today.

## 2021-03-12 ENCOUNTER — Encounter: Payer: Self-pay | Admitting: Psychiatry

## 2021-03-12 LAB — CULTURE, BLOOD (ROUTINE X 2)
Culture: NO GROWTH
Culture: NO GROWTH
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
## 2021-03-18 DIAGNOSIS — Z09 Encounter for follow-up examination after completed treatment for conditions other than malignant neoplasm: Secondary | ICD-10-CM | POA: Diagnosis not present

## 2021-03-18 DIAGNOSIS — N814 Uterovaginal prolapse, unspecified: Secondary | ICD-10-CM | POA: Diagnosis not present

## 2021-03-18 DIAGNOSIS — N390 Urinary tract infection, site not specified: Secondary | ICD-10-CM | POA: Diagnosis not present

## 2021-03-18 DIAGNOSIS — N179 Acute kidney failure, unspecified: Secondary | ICD-10-CM | POA: Diagnosis not present

## 2021-03-18 DIAGNOSIS — D649 Anemia, unspecified: Secondary | ICD-10-CM | POA: Diagnosis not present

## 2021-03-18 DIAGNOSIS — R252 Cramp and spasm: Secondary | ICD-10-CM | POA: Diagnosis not present

## 2021-03-18 DIAGNOSIS — I2510 Atherosclerotic heart disease of native coronary artery without angina pectoris: Secondary | ICD-10-CM | POA: Diagnosis not present

## 2021-03-20 ENCOUNTER — Encounter (HOSPITAL_COMMUNITY): Payer: Self-pay

## 2021-03-20 ENCOUNTER — Other Ambulatory Visit: Payer: Self-pay

## 2021-03-20 ENCOUNTER — Ambulatory Visit (HOSPITAL_COMMUNITY)
Admission: EM | Admit: 2021-03-20 | Discharge: 2021-03-20 | Disposition: A | Discharge status: Home / Self Care | Payer: PPO

## 2021-03-20 ENCOUNTER — Inpatient Hospital Stay (HOSPITAL_COMMUNITY)
Admission: EM | Admit: 2021-03-20 | Discharge: 2021-03-22 | DRG: 641 | Disposition: A | Discharge status: Home / Self Care | Payer: PPO | Source: Ambulatory Visit | Attending: Internal Medicine | Admitting: Internal Medicine

## 2021-03-20 DIAGNOSIS — E875 Hyperkalemia: Secondary | ICD-10-CM | POA: Diagnosis not present

## 2021-03-20 DIAGNOSIS — Z8249 Family history of ischemic heart disease and other diseases of the circulatory system: Secondary | ICD-10-CM

## 2021-03-20 DIAGNOSIS — Z83438 Family history of other disorder of lipoprotein metabolism and other lipidemia: Secondary | ICD-10-CM

## 2021-03-20 DIAGNOSIS — Z6831 Body mass index (BMI) 31.0-31.9, adult: Secondary | ICD-10-CM | POA: Diagnosis not present

## 2021-03-20 DIAGNOSIS — I2510 Atherosclerotic heart disease of native coronary artery without angina pectoris: Secondary | ICD-10-CM | POA: Diagnosis present

## 2021-03-20 DIAGNOSIS — Z87440 Personal history of urinary (tract) infections: Secondary | ICD-10-CM

## 2021-03-20 DIAGNOSIS — D72829 Elevated white blood cell count, unspecified: Secondary | ICD-10-CM | POA: Diagnosis not present

## 2021-03-20 DIAGNOSIS — Z87891 Personal history of nicotine dependence: Secondary | ICD-10-CM

## 2021-03-20 DIAGNOSIS — I10 Essential (primary) hypertension: Secondary | ICD-10-CM | POA: Diagnosis present

## 2021-03-20 DIAGNOSIS — Z86718 Personal history of other venous thrombosis and embolism: Secondary | ICD-10-CM

## 2021-03-20 DIAGNOSIS — F32A Depression, unspecified: Secondary | ICD-10-CM | POA: Diagnosis present

## 2021-03-20 DIAGNOSIS — F419 Anxiety disorder, unspecified: Secondary | ICD-10-CM | POA: Diagnosis present

## 2021-03-20 DIAGNOSIS — N19 Unspecified kidney failure: Secondary | ICD-10-CM

## 2021-03-20 DIAGNOSIS — E669 Obesity, unspecified: Secondary | ICD-10-CM | POA: Diagnosis not present

## 2021-03-20 DIAGNOSIS — Z8673 Personal history of transient ischemic attack (TIA), and cerebral infarction without residual deficits: Secondary | ICD-10-CM | POA: Diagnosis not present

## 2021-03-20 DIAGNOSIS — E1151 Type 2 diabetes mellitus with diabetic peripheral angiopathy without gangrene: Secondary | ICD-10-CM | POA: Diagnosis not present

## 2021-03-20 DIAGNOSIS — E1122 Type 2 diabetes mellitus with diabetic chronic kidney disease: Secondary | ICD-10-CM | POA: Diagnosis present

## 2021-03-20 DIAGNOSIS — E785 Hyperlipidemia, unspecified: Secondary | ICD-10-CM | POA: Diagnosis not present

## 2021-03-20 DIAGNOSIS — N179 Acute kidney failure, unspecified: Secondary | ICD-10-CM | POA: Diagnosis present

## 2021-03-20 DIAGNOSIS — Z20822 Contact with and (suspected) exposure to covid-19: Secondary | ICD-10-CM | POA: Diagnosis present

## 2021-03-20 DIAGNOSIS — Z8674 Personal history of sudden cardiac arrest: Secondary | ICD-10-CM | POA: Diagnosis not present

## 2021-03-20 DIAGNOSIS — Z79890 Hormone replacement therapy (postmenopausal): Secondary | ICD-10-CM

## 2021-03-20 DIAGNOSIS — E782 Mixed hyperlipidemia: Secondary | ICD-10-CM | POA: Diagnosis not present

## 2021-03-20 DIAGNOSIS — N952 Postmenopausal atrophic vaginitis: Secondary | ICD-10-CM | POA: Diagnosis not present

## 2021-03-20 DIAGNOSIS — N814 Uterovaginal prolapse, unspecified: Secondary | ICD-10-CM | POA: Diagnosis present

## 2021-03-20 DIAGNOSIS — I252 Old myocardial infarction: Secondary | ICD-10-CM

## 2021-03-20 DIAGNOSIS — N182 Chronic kidney disease, stage 2 (mild): Secondary | ICD-10-CM | POA: Diagnosis present

## 2021-03-20 DIAGNOSIS — Z87828 Personal history of other (healed) physical injury and trauma: Secondary | ICD-10-CM | POA: Diagnosis not present

## 2021-03-20 DIAGNOSIS — I129 Hypertensive chronic kidney disease with stage 1 through stage 4 chronic kidney disease, or unspecified chronic kidney disease: Secondary | ICD-10-CM | POA: Diagnosis not present

## 2021-03-20 DIAGNOSIS — Z951 Presence of aortocoronary bypass graft: Secondary | ICD-10-CM | POA: Diagnosis not present

## 2021-03-20 DIAGNOSIS — R053 Chronic cough: Secondary | ICD-10-CM | POA: Diagnosis not present

## 2021-03-20 DIAGNOSIS — Z981 Arthrodesis status: Secondary | ICD-10-CM

## 2021-03-20 DIAGNOSIS — K219 Gastro-esophageal reflux disease without esophagitis: Secondary | ICD-10-CM | POA: Diagnosis present

## 2021-03-20 DIAGNOSIS — Z7984 Long term (current) use of oral hypoglycemic drugs: Secondary | ICD-10-CM

## 2021-03-20 DIAGNOSIS — F325 Major depressive disorder, single episode, in full remission: Secondary | ICD-10-CM | POA: Diagnosis not present

## 2021-03-20 DIAGNOSIS — Z7982 Long term (current) use of aspirin: Secondary | ICD-10-CM

## 2021-03-20 DIAGNOSIS — Z833 Family history of diabetes mellitus: Secondary | ICD-10-CM

## 2021-03-20 LAB — HEPATIC FUNCTION PANEL
ALT: 24 U/L (ref 0–44)
AST: 21 U/L (ref 15–41)
Albumin: 3.3 g/dL — ABNORMAL LOW (ref 3.5–5.0)
Alkaline Phosphatase: 63 U/L (ref 38–126)
Bilirubin, Direct: <0.1 mg/dL (ref 0.0–0.2)
Total Bilirubin: 0.6 mg/dL (ref 0.3–1.2)
Total Protein: 7.3 g/dL (ref 6.5–8.1)

## 2021-03-20 LAB — BASIC METABOLIC PANEL
Anion gap: 9 (ref 5–15)
BUN: 30 mg/dL — ABNORMAL HIGH (ref 8–23)
CO2: 21 mmol/L — ABNORMAL LOW (ref 22–32)
Calcium: 9.3 mg/dL (ref 8.9–10.3)
Chloride: 105 mmol/L (ref 98–111)
Creatinine, Ser: 1.29 mg/dL — ABNORMAL HIGH (ref 0.44–1.00)
GFR, Estimated: 45 mL/min — ABNORMAL LOW (ref >60)
Glucose, Bld: 104 mg/dL — ABNORMAL HIGH (ref 70–99)
Potassium: 6.1 mmol/L — ABNORMAL HIGH (ref 3.5–5.1)
Sodium: 135 mmol/L (ref 135–145)

## 2021-03-20 LAB — URINALYSIS, ROUTINE W REFLEX MICROSCOPIC
Bilirubin Urine: NEGATIVE
Glucose, UA: NEGATIVE mg/dL
Hgb urine dipstick: NEGATIVE
Ketones, ur: NEGATIVE mg/dL
Leukocytes,Ua: NEGATIVE
Nitrite: NEGATIVE
Protein, ur: NEGATIVE mg/dL
Specific Gravity, Urine: 1.014 (ref 1.005–1.030)
pH: 5 (ref 5.0–8.0)

## 2021-03-20 LAB — NA AND K (SODIUM & POTASSIUM), RAND UR
Potassium Urine: 50 mmol/L
Sodium, Ur: 41 mmol/L

## 2021-03-20 LAB — OSMOLALITY, URINE: Osmolality, Ur: 429 mOsm/kg (ref 300–900)

## 2021-03-20 LAB — CREATININE, URINE, RANDOM: Creatinine, Urine: 91.32 mg/dL

## 2021-03-20 LAB — I-STAT VENOUS BLOOD GAS, ED
Acid-base deficit: 4 mmol/L — ABNORMAL HIGH (ref 0.0–2.0)
Bicarbonate: 21.3 mmol/L (ref 20.0–28.0)
Calcium, Ion: 1.2 mmol/L (ref 1.15–1.40)
HCT: 29 % — ABNORMAL LOW (ref 36.0–46.0)
Hemoglobin: 9.9 g/dL — ABNORMAL LOW (ref 12.0–15.0)
O2 Saturation: 92 %
Potassium: 6 mmol/L — ABNORMAL HIGH (ref 3.5–5.1)
Sodium: 137 mmol/L (ref 135–145)
TCO2: 22 mmol/L (ref 22–32)
pCO2, Ven: 38.3 mmHg — ABNORMAL LOW (ref 44.0–60.0)
pH, Ven: 7.354 (ref 7.250–7.430)
pO2, Ven: 67 mmHg — ABNORMAL HIGH (ref 32.0–45.0)

## 2021-03-20 LAB — OSMOLALITY: Osmolality: 300 mOsm/kg — ABNORMAL HIGH (ref 275–295)

## 2021-03-20 LAB — MAGNESIUM: Magnesium: 1.6 mg/dL — ABNORMAL LOW (ref 1.7–2.4)

## 2021-03-20 MED ORDER — ASPIRIN EC 81 MG PO TBEC
81.0000 mg | DELAYED_RELEASE_TABLET | Freq: Every day | ORAL | Status: DC
  Administered 2021-03-21 – 2021-03-22 (×2): 81 mg via ORAL
  Filled 2021-03-20 (×2): qty 1

## 2021-03-20 MED ORDER — ONDANSETRON HCL 4 MG/2ML IJ SOLN: 4.0000 mg | Freq: Four times a day (QID) | INTRAMUSCULAR | Status: DC | PRN

## 2021-03-20 MED ORDER — LACTATED RINGERS IV BOLUS
500.0000 mL | Freq: Once | INTRAVENOUS | Status: AC
  Administered 2021-03-20: 500 mL via INTRAVENOUS

## 2021-03-20 MED ORDER — PANTOPRAZOLE SODIUM 40 MG PO TBEC
40.0000 mg | DELAYED_RELEASE_TABLET | Freq: Two times a day (BID) | ORAL | Status: DC
  Administered 2021-03-21 – 2021-03-22 (×4): 40 mg via ORAL
  Filled 2021-03-20 (×4): qty 1

## 2021-03-20 MED ORDER — HYDRALAZINE HCL 20 MG/ML IJ SOLN: 10.0000 mg | Freq: Four times a day (QID) | INTRAMUSCULAR | Status: DC | PRN

## 2021-03-20 MED ORDER — AMLODIPINE BESYLATE 5 MG PO TABS
5.0000 mg | ORAL_TABLET | Freq: Every day | ORAL | Status: DC
  Administered 2021-03-21 – 2021-03-22 (×2): 5 mg via ORAL
  Filled 2021-03-20 (×2): qty 1

## 2021-03-20 MED ORDER — MELATONIN 5 MG PO TABS
10.0000 mg | ORAL_TABLET | Freq: Every evening | ORAL | Status: DC | PRN
  Filled 2021-03-20: qty 2

## 2021-03-20 MED ORDER — ENOXAPARIN SODIUM 40 MG/0.4ML IJ SOSY
40.0000 mg | PREFILLED_SYRINGE | INTRAMUSCULAR | Status: DC
  Administered 2021-03-21 – 2021-03-22 (×2): 40 mg via SUBCUTANEOUS
  Filled 2021-03-20 (×2): qty 0.4

## 2021-03-20 MED ORDER — ALPRAZOLAM 0.25 MG PO TABS
0.2500 mg | ORAL_TABLET | Freq: Two times a day (BID) | ORAL | Status: DC | PRN
  Administered 2021-03-21: 0.25 mg via ORAL
  Filled 2021-03-20: qty 2

## 2021-03-20 MED ORDER — ACETAMINOPHEN 325 MG PO TABS: 650.0000 mg | ORAL_TABLET | Freq: Four times a day (QID) | ORAL | Status: DC | PRN

## 2021-03-20 MED ORDER — METOPROLOL TARTRATE 25 MG PO TABS
25.0000 mg | ORAL_TABLET | Freq: Two times a day (BID) | ORAL | Status: DC
  Administered 2021-03-21 – 2021-03-22 (×4): 25 mg via ORAL
  Filled 2021-03-20 (×4): qty 1

## 2021-03-20 MED ORDER — SODIUM CHLORIDE 0.9 % IV SOLN: INTRAVENOUS | Status: AC

## 2021-03-20 MED ORDER — ONDANSETRON HCL 4 MG PO TABS: 4.0000 mg | ORAL_TABLET | Freq: Four times a day (QID) | ORAL | Status: DC | PRN

## 2021-03-20 MED ORDER — ACETAMINOPHEN 650 MG RE SUPP: 650.0000 mg | Freq: Four times a day (QID) | RECTAL | Status: DC | PRN

## 2021-03-20 MED ORDER — LORATADINE 10 MG PO TABS
10.0000 mg | ORAL_TABLET | Freq: Every day | ORAL | Status: DC
  Administered 2021-03-21 – 2021-03-22 (×2): 10 mg via ORAL
  Filled 2021-03-20 (×2): qty 1

## 2021-03-20 MED ORDER — CILOSTAZOL 100 MG PO TABS
100.0000 mg | ORAL_TABLET | Freq: Two times a day (BID) | ORAL | Status: DC
  Administered 2021-03-21 – 2021-03-22 (×3): 100 mg via ORAL
  Filled 2021-03-20 (×4): qty 1

## 2021-03-20 MED ORDER — POLYETHYLENE GLYCOL 3350 17 G PO PACK: 17.0000 g | PACK | Freq: Every day | ORAL | Status: DC | PRN

## 2021-03-20 MED ORDER — SODIUM ZIRCONIUM CYCLOSILICATE 10 G PO PACK
10.0000 g | PACK | Freq: Once | ORAL | Status: AC
  Administered 2021-03-21: 10 g via ORAL
  Filled 2021-03-20: qty 1

## 2021-03-20 MED ORDER — SERTRALINE HCL 100 MG PO TABS
100.0000 mg | ORAL_TABLET | Freq: Two times a day (BID) | ORAL | Status: DC
  Administered 2021-03-21 – 2021-03-22 (×4): 100 mg via ORAL
  Filled 2021-03-20 (×5): qty 1

## 2021-03-20 MED ORDER — ATORVASTATIN CALCIUM 80 MG PO TABS
80.0000 mg | ORAL_TABLET | Freq: Every day | ORAL | Status: DC
  Administered 2021-03-21 – 2021-03-22 (×2): 80 mg via ORAL
  Filled 2021-03-20 (×2): qty 1

## 2021-03-20 MED ORDER — INSULIN ASPART 100 UNIT/ML IJ SOLN
0.0000 [IU] | Freq: Three times a day (TID) | INTRAMUSCULAR | Status: DC
  Administered 2021-03-21 (×2): 2 [IU] via SUBCUTANEOUS

## 2021-03-20 NOTE — ED Notes (Signed)
Patient is being discharged from the Urgent Care and sent to the Emergency Department via pov. Per Marin Roberts, PA, patient is in need of higher level of care due to potassium blood draw. Patient is aware and verbalizes understanding of plan of care.  Vitals:   03/20/21 1352  BP: 121/79  Pulse: 94  Resp: 18  Temp: 98.7 F (37.1 C)  SpO2: 93%

## 2021-03-20 NOTE — ED Triage Notes (Signed)
Patient sent by her MD to have her potassium level rechecked after lab resulted high from blood work in office. Denies all complaints.

## 2021-03-20 NOTE — ED Provider Notes (Signed)
[place] EMERGENCY DEPARTMENT Provider Note   CSN: [id number] Arrival date & time: 03/20/21  1436     History No chief complaint on file.   Alicia Crawford is a 65 y.o. female.  The history is provided by the patient and medical records.   Patient was sent to the emergency department by her primary care doctor after routine blood work showed hyperkalemia with a potassium of 6.5. Largely, patient is asymptomatic from this.  Says that she has been feeling better since hospital discharge approximately 10 days ago. Patient endorses somewhat chronic watery diarrhea as well as intermittent nausea and vomiting without abdominal pain since previous admission to the hospital.  She was admitted several weeks ago after being found to have an AKI and leukocytosis.  AKI resolved with IV hydration however she then was found to have a UTI which was treated with antibiotics.  She is still on antibiotics, taking Keflex. Denies urinary symptoms. No recurrent fever, chills. Says that she has been drinking plenty of water and attempt to stay hydrated at home.      Past Medical History:  Diagnosis Date   Ankle fracture 09/05/2016   right ankle   Anoxic brain injury [facility]) 08/2010   "w/cardiac arrest"   Anxiety Jan. 2012   Blood transfusion 09/2010   Cardiac arrest ([place]) 08/04/2010   Carotid artery occlusion    mild to moderate bilateral internal carotid artery stenosis   Claudication ([place])    Complication of anesthesia    [place]   Coronary artery disease    CABG 2011 in [facility]   Coronary artery disease    Dementia    Depression    DVT (deep venous thrombosis) (HCC)    GERD (gastroesophageal reflux disease)    High cholesterol    Hypertension    Myocardial infarction ([place]) 09/27/10   in [email or web address]   Obesity    Peripheral vascular disease ([place])    bilat SFA diseae   Renal artery stenosis (HCC)    Rotator cuff tendonitis    Sleep apnea    uses CPAP  nightly   Stroke [facility]) 2011   Subdural hematoma [facility]) May 25,2013   Frontal subdural  S/P evacuation of hematoma   Type II diabetes mellitus ([place])    Diet and Exercise    Patient Active Problem List   Diagnosis Date Noted   Hyperkalemia 03/20/2021   Coronary artery disease involving native heart without angina pectoris 03/20/2021   Acute renal failure superimposed on stage 2 chronic kidney disease ([place]) 03/06/2021   OSA (obstructive sleep apnea) 08/20/2020   Healthcare maintenance 08/20/2020   Chronic cough 05/28/2020   MDD (major depressive disorder) 10/15/2018   Peripheral vascular disease, unspecified ([place]) 03/14/2014   Aftercare following surgery of the circulatory system, NEC 03/14/2014   Occlusion and stenosis of carotid artery without mention of cerebral infarction 02/11/2013   SDH (subdural hematoma) ([place]) 03/02/2012   S/P craniotomy, 02/25/12 after fall, SDH 02/27/2012   Bradycardia 02/27/2012   Obesity 02/27/2012   Diabetes mellitus 02/27/2012   CAD, cardiac arrest, CPR, CABG in [facility] Dec 2011-NL LVF by Echo, neg Nuc April 2013 02/27/2012   Anxiety, ? residual hypoxic brain inhury from cardiac arrest 2011 02/27/2012   Peripheral artery disease, bilta LE PVD by angio April 2013 01/18/2012   HLD (hyperlipidemia) 01/18/2012   Essential hypertension 01/18/2012   Renal artery stenosis, Right, 75% 01/18/2012    Past Surgical History:  Procedure Laterality Date  ABDOMINOPLASTY/PANNICULECTOMY     Had wound complication with MRSA after   ATHERECTOMY N/A 01/17/2012   Procedure: ATHERECTOMY;  Surgeon: Lorretta Harp, MD;  Location: [facility] CATH LAB;  Service: Cardiovascular;  Laterality: N/A;   BACK SURGERY     CHOLECYSTECTOMY  ~ 1980's   CORONARY ARTERY BYPASS GRAFT  09/2010   CABG X4   CRANIOTOMY  02/25/2012   Procedure: CRANIOTOMY HEMATOMA EVACUATION SUBDURAL;  Surgeon: Eustace Moore, MD;  Location: [facility];  Service: Neurosurgery;  Laterality: Left;  Left craniotomy  for evacuation of subdural hematoma   LOWER EXTREMITY ANGIOGRAM  April 2013   ORIF TOE FRACTURE Left 04/25/2019   Procedure: Open Reduction Internal Fixation (ORIF) Left Fifth Metatarsal Ronnald Ramp Fracture;  Surgeon: Wylene Simmer, MD;  Location: [facility];  Service: Orthopedics;  Laterality: Left;   OVARIAN CYST REMOVAL  1983   POSTERIOR FUSION LUMBAR SPINE  ` 2000   SPINE SURGERY     stomach and skin tuck  2000     OB History     Gravida  2   Para      Term      Preterm      AB      Living  2      SAB      IAB      Ectopic      Multiple      Live Births              Family History  Problem Relation Age of Onset   Heart disease Mother        before age 71   Diabetes Mother    Hyperlipidemia Mother    Hypertension Mother    Heart attack Mother    Heart disease Father        Before age 2   Hyperlipidemia Father    Hypertension Father    Heart attack Father    Heart disease Sister        Before age 73   Heart attack Sister    Hyperlipidemia Brother    Hypertension Brother    Heart attack Brother    Heart disease Brother        before age 25   Heart disease Other    Diabetes Other    Hypertension Other    Alcohol abuse Other    Mental illness Other     Social History   Tobacco Use   Smoking status: Former    Packs/day: 1.00    Years: 30.00    Pack years: 30.00    Types: Cigarettes    Quit date: 06/23/2003    Years since quitting: 17.7   Smokeless tobacco: Never  Substance Use Topics   Alcohol use: Not Currently    Alcohol/week: 0.0 standard drinks    Comment: 01/17/12 "used to have glass of wine now & then; last wine was ~ 2011"   Drug use: No    Home Medications Prior to Admission medications   Medication Sig Start Date End Date Taking? Authorizing Provider  acetaminophen (TYLENOL) 650 MG CR tablet Take 650 mg by mouth every 6 (six) hours as needed for pain.   Yes [provider]  ALPRAZolam (XANAX) 0.5 MG  tablet Take 1/2-1 tablet 3 times daily for the next 3-5 days, then take as needed for panic Patient taking differently: Take 0.25-0.5 mg by mouth 2 (two) times daily as needed for anxiety or sleep. 03/10/21  Yes  Thayer Headings, PMHNP  Ascorbic Acid (VITAMIN C) 100 MG tablet Take 100 mg by mouth daily.   Yes [provider]  aspirin EC 81 MG tablet Take 81 mg by mouth daily.   Yes [provider]  atorvastatin (LIPITOR) 80 MG tablet TAKE 1 TABLET BY MOUTH DAILY Patient taking differently: Take 80 mg by mouth daily. 12/23/20  Yes Lorretta Harp, MD  cephALEXin (KEFLEX) 500 MG capsule Take 500 mg by mouth 2 (two) times daily. 03/19/21  Yes [provider]  chlorpheniramine (CHLOR-TRIMETON) 4 MG tablet Take 1-2 tablets (4-8 mg total) by mouth at bedtime as needed for allergies. Patient taking differently: Take 4 mg by mouth at bedtime as needed for allergies. 09/02/20  Yes Lauraine Rinne, NP  Cholecalciferol (VITAMIN D3) 1.25 MG (50000 UT) CAPS Take 1 capsule by mouth daily.   Yes [provider]  CHONDROITIN SULFATE PO Take 1,200 mg by mouth in the morning and at bedtime.   Yes [provider]  cilostazol (PLETAL) 100 MG tablet TAKE 1 TABLET BY MOUTH TWICE DAILY BEFORE MEALS Patient taking differently: Take 100 mg by mouth 2 (two) times daily before a meal. 04/16/20  Yes Lorretta Harp, MD  Coenzyme Q10 300 MG CAPS Take 300 mg by mouth daily.   Yes [provider]  estradiol (ESTRACE) 0.1 MG/GM vaginal cream Place 0.5 g vaginally 2 (two) times a week. Place 0.5g nightly for two weeks then twice a week after Patient taking differently: Place 0.5 g vaginally 2 (two) times a week. 02/04/21  Yes Jaquita Folds, MD  fluticasone [facility]) 50 MCG/ACT nasal spray Place 1 spray into both nostrils daily. Patient taking differently: Place 1 spray into both nostrils daily as needed for allergies. 08/20/20  Yes Lauraine Rinne, NP  irbesartan (AVAPRO) 150 MG  tablet TAKE 1 TABLET(150 MG) BY MOUTH DAILY Patient taking differently: Take 150 mg by mouth daily. 01/01/21  Yes Collene Gobble, MD  loratadine (CLARITIN) 10 MG tablet Take 1 tablet (10 mg total) by mouth daily. 05/28/20  Yes Collene Gobble, MD  magnesium oxide (MAG-OX) 400 MG tablet Take 400 mg by mouth at bedtime.   Yes [provider]  Melatonin 10 MG TABS Take 5 mg by mouth at bedtime as needed (sleep).   Yes [provider]  metFORMIN (GLUCOPHAGE) 1000 MG tablet Take 1,000 mg by mouth 2 (two) times daily with a meal. 01/05/15  Yes [provider]  metoprolol tartrate (LOPRESSOR) 25 MG tablet TAKE 1 TABLET(25 MG) BY MOUTH TWICE DAILY Patient taking differently: Take 25 mg by mouth 2 (two) times daily. 02/02/21  Yes Lorretta Harp, MD  pantoprazole (PROTONIX) 40 MG tablet Take 1 tablet (40 mg total) by mouth 2 (two) times daily. 03/03/21  Yes Collene Gobble, MD  PREBIOTIC PRODUCT PO Take 1 capsule by mouth daily.   Yes [provider]  Probiotic Product (PROBIOTIC-10 PO) Take 1 capsule by mouth daily.   Yes [provider]  QUERCETIN PO Take 1,000 mg by mouth in the morning and at bedtime.   Yes [provider]  REPATHA SURECLICK 595 MG/ML SOAJ ADMINISTER 1 ML UNDER THE SKIN EVERY 14 DAYS Patient taking differently: Inject 140 mg into the skin every 14 (fourteen) days. 03/09/20  Yes Lorretta Harp, MD  sertraline (ZOLOFT) 100 MG tablet Take 2 tablets (200 mg total) by mouth daily. Patient taking differently: Take 100 mg by mouth in the morning and  at bedtime. 03/10/21 12/05/21 Yes Thayer Headings, PMHNP  zinc gluconate 50 MG tablet Take 50 mg by mouth daily.   Yes [provider]    Allergies    [place]  Review of Systems   Review of Systems  Constitutional:  Positive for appetite change. Negative for chills and fever.  HENT:  Negative for ear pain and sore throat.   Eyes:  Negative for pain and visual disturbance.   Respiratory:  Negative for cough and shortness of breath.   Cardiovascular:  Negative for chest pain and palpitations.  Gastrointestinal:  Positive for diarrhea, nausea and vomiting. Negative for abdominal pain and blood in stool.  Genitourinary:  Negative for dysuria and hematuria.  Musculoskeletal:  Negative for arthralgias and back pain.  Skin:  Negative for color change and rash.  Neurological:  Negative for seizures and syncope.  All other systems reviewed and are negative.  Physical Exam Updated Vital Signs BP (!) 112/58 (BP Location: Right Arm)   Pulse 84   Temp 98.4 F (36.9 C) (Oral)   Resp 16   Ht 5\' 3"  (1.6 m)   Wt 79.5 kg   SpO2 95%   BMI 31.05 kg/m   Physical Exam Vitals and nursing note reviewed.  Constitutional:      General: She is not in acute distress.    Appearance: She is well-developed. She is not ill-appearing or toxic-appearing.  HENT:     Head: Normocephalic and atraumatic.     Mouth/Throat:     Mouth: Mucous membranes are moist.     Pharynx: Oropharynx is clear.  Eyes:     Conjunctiva/sclera: Conjunctivae normal.  Cardiovascular:     Rate and Rhythm: Normal rate and regular rhythm.     Pulses: Normal pulses.     Heart sounds: No murmur heard. Pulmonary:     Effort: Pulmonary effort is normal. No respiratory distress.     Breath sounds: Normal breath sounds.  Abdominal:     General: Abdomen is flat.     Palpations: Abdomen is soft.     Tenderness: There is no abdominal tenderness. There is no right CVA tenderness or left CVA tenderness.  Musculoskeletal:     Cervical back: Neck supple.     Right lower leg: No edema.     Left lower leg: No edema.  Skin:    General: Skin is warm and dry.  Neurological:     General: No focal deficit present.     Mental Status: She is alert.     GCS: GCS eye subscore is 4. GCS verbal subscore is 5. GCS motor subscore is 6.  Psychiatric:        Behavior: Behavior is cooperative.    ED Results /  Procedures / Treatments   Labs (all labs ordered are listed, but only abnormal results are displayed) Labs Reviewed  BASIC METABOLIC PANEL - Abnormal; Notable for the following components:      Result Value   Potassium 6.1 (*)    CO2 21 (*)    Glucose, Bld 104 (*)    BUN 30 (*)    Creatinine, Ser 1.29 (*)    GFR, Estimated 45 (*)    All other components within normal limits  MAGNESIUM - Abnormal; Notable for the following components:   Magnesium 1.6 (*)    All other components within normal limits  OSMOLALITY - Abnormal; Notable for the following components:   Osmolality 300 (*)    All other components within normal  limits  HEPATIC FUNCTION PANEL - Abnormal; Notable for the following components:   Albumin 3.3 (*)    All other components within normal limits  COMPREHENSIVE METABOLIC PANEL - Abnormal; Notable for the following components:   Potassium 5.5 (*)    BUN 25 (*)    Creatinine, Ser 1.18 (*)    Albumin 3.2 (*)    GFR, Estimated 51 (*)    All other components within normal limits  MAGNESIUM - Abnormal; Notable for the following components:   Magnesium 1.5 (*)    All other components within normal limits  CBC WITH DIFFERENTIAL/PLATELET - Abnormal; Notable for the following components:   RBC 3.26 (*)    Hemoglobin 9.4 (*)    HCT 30.6 (*)    Platelets 496 (*)    All other components within normal limits  GLUCOSE, CAPILLARY - Abnormal; Notable for the following components:   Glucose-Capillary 144 (*)    All other components within normal limits  GLUCOSE, CAPILLARY - Abnormal; Notable for the following components:   Glucose-Capillary 122 (*)    All other components within normal limits  I-STAT VENOUS BLOOD GAS, ED - Abnormal; Notable for the following components:   pCO2, Ven 38.3 (*)    pO2, Ven 67.0 (*)    Acid-base deficit 4.0 (*)    Potassium 6.0 (*)    HCT 29.0 (*)    Hemoglobin 9.9 (*)    All other components within normal limits  SARS CORONAVIRUS 2 (TAT 6-24  HRS)  URINALYSIS, ROUTINE W REFLEX MICROSCOPIC  NA AND K (SODIUM & POTASSIUM), RAND UR  OSMOLALITY, URINE  CREATININE, URINE, RANDOM  GLUCOSE, CAPILLARY  BASIC METABOLIC PANEL    EKG None  Radiology No results found.  Procedures Procedures   Medications Ordered in ED Medications  0.9 %  sodium chloride infusion ( Intravenous Rate/Dose Change 03/21/21 1125)  aspirin EC tablet 81 mg (81 mg Oral Given 03/21/21 0917)  atorvastatin (LIPITOR) tablet 80 mg (80 mg Oral Given 03/21/21 0917)  metoprolol tartrate (LOPRESSOR) tablet 25 mg (25 mg Oral Given 03/21/21 0917)  ALPRAZolam (XANAX) tablet 0.25-0.5 mg (has no administration in time range)  sertraline (ZOLOFT) tablet 100 mg (100 mg Oral Given 03/21/21 0917)  pantoprazole (PROTONIX) EC tablet 40 mg (40 mg Oral Given 03/21/21 0917)  cilostazol (PLETAL) tablet 100 mg (100 mg Oral Given 03/21/21 1630)  melatonin tablet 10 mg (has no administration in time range)  loratadine (CLARITIN) tablet 10 mg (10 mg Oral Given 03/21/21 0917)  insulin aspart (novoLOG) injection 0-15 Units (2 Units Subcutaneous Given 03/21/21 1630)  enoxaparin (LOVENOX) injection 40 mg (40 mg Subcutaneous Given 03/21/21 0917)  acetaminophen (TYLENOL) tablet 650 mg (has no administration in time range)    Or  acetaminophen (TYLENOL) suppository 650 mg (has no administration in time range)  polyethylene glycol (MIRALAX / GLYCOLAX) packet 17 g (has no administration in time range)  ondansetron (ZOFRAN) tablet 4 mg (has no administration in time range)    Or  ondansetron (ZOFRAN) injection 4 mg (has no administration in time range)  amLODipine (NORVASC) tablet 5 mg (5 mg Oral Given 03/21/21 0917)  sodium zirconium cyclosilicate (LOKELMA) packet 10 g (10 g Oral Given 03/21/21 1630)  lactated ringers bolus 500 mL (0 mLs Intravenous Stopped 03/20/21 2138)  lactated ringers bolus 500 mL (0 mLs Intravenous Stopped 03/20/21 2225)  sodium zirconium cyclosilicate (LOKELMA) packet 10 g (10  g Oral Given 03/21/21 0030)  magnesium sulfate IVPB 2 g 50 mL (2  g Intravenous New Bag/Given 03/21/21 1053)    ED Course  I have reviewed the triage vital signs and the nursing notes.  Pertinent labs & imaging results that were available during my care of the patient were reviewed by me and considered in my medical decision making (see chart for details).  Clinical Course as of 03/21/21 2005  Sat Mar 20, 2021  2115 Spoke with hospitalist about my impression and concerns for AKI and hyperkalemia.  Agrees with my assessment at this point, coming down to see the patient. [ZB]    Clinical Course User Index [ZB] Pearson Grippe, DO   MDM Rules/Calculators/A&P                          This is a 65 year old female with complicated past medical history as above including recent hospital admission for acute kidney injury complicated by urinary tract infection for which she is still taking antibiotics who presented to the emergency department sent from primary care doctor after routine blood work showed hyperkalemia and evidence of developing acute kidney injury.  Lab work here confirms worsening creatinine, BUN and hyperkalemia at 6.1. Patient largely asymptomatic.  Says that she has been drinking plenty of water as an attempt to stay hydrated.  She does not appear hypovolemic on exam.  She is not hypotensive or tachycardic. I do not suspect an obstructive uropathy however she does have a history of uterine prolapse. Did consider urinary tract infection refractory to antibiotic regimen however urinalysis is not consistent with UTI here.  Despite a reassuring clinical exam, she could be intravascularly deplete secondary to GI losses with ongoing watery diarrhea and intermittent nausea, vomiting.  If that is the case, AKI may be prerenal.  Treatment in the emergency department included IV crystalloid. EKG reassuring and without electrophysiologic changes that would suggest hyperkalemia. I ordered  for additional lab work and urine studies to further differentiate her kidney injury.  Admitted to medicine for further work-up and treatment.   Final Clinical Impression(s) / ED Diagnoses Final diagnoses:  Renal failure    Rx / DC Orders ED Discharge Orders     None        Pearson Grippe, DO 03/21/21 2005    Wyvonnia Dusky, MD 03/22/21 1004

## 2021-03-20 NOTE — Discharge Instructions (Addendum)
-  Head straight to the ED for monitoring of your potassium level and treatment if necessary

## 2021-03-20 NOTE — ED Triage Notes (Signed)
Pt reports PCP called her yesterday reporting a 6.5 potassium level taken two days ago and was directed to have potassium level rechecked.  Pt was recently seen in the hospital for a prolapse infection.

## 2021-03-20 NOTE — ED Provider Notes (Signed)
Emergency Medicine Provider Triage Evaluation Note  Sayana Salley , a 65 y.o. female  was evaluated in triage.  Pt sent from UC for blood work. She was recently admitted and went to see her PCP 2 days ago for follow up. Had bloodwork done which showed hyperkalemia 6.5. Pt was told to go to UC today to have it rechecked. UC was unable to do this and sent her here. Pt has no complaints. Has been doing well since being home..  Review of Systems  Positive: NO complaints Negative: NO Complaints  Physical Exam  BP 123/90 (BP Location: Left Arm)   Pulse 87   Temp 97.8 F (36.6 C) (Oral)   Resp 18   SpO2 99%  Gen:   Awake, no distress   Resp:  Normal effort  MSK:   Moves extremities without difficulty  Other:    Medical Decision Making  Medically screening exam initiated at 2:52 PM.  Appropriate orders placed.  [name] was informed that the remainder of the evaluation will be completed by another provider, this initial triage assessment does not replace that evaluation, and the importance of remaining in the ED until their evaluation is complete.     Eustaquio Maize, PA-C 03/20/21 1453    Arnaldo Natal, MD 03/21/21 1154

## 2021-03-20 NOTE — ED Provider Notes (Signed)
[place]    CSN: [id number] Arrival date & time: 03/20/21  1309      History   Chief Complaint Chief Complaint  Patient presents with   need for blood work   potassium concern    HPI Alicia Crawford is a 65 y.o. female presenting with concern for high potassium. Medical history hospitalization for AKI about 2 weeks ago on 6/4. History MI.  States that she is feeling great today, but she saw her primary care on 6/16, and at that time her potassium level was 6.5.  They told her to go to urgent care to have this rechecked, they told her that she should wait until this resulted and then have treatment if necessary.  Currently denies shortness of breath, dizziness, chest pain, weakness, urinary symptoms.  HPI  Past Medical History:  Diagnosis Date   Ankle fracture 09/05/2016   right ankle   Anoxic brain injury [facility]) 08/2010   "w/cardiac arrest"   Anxiety Jan. 2012   Blood transfusion 09/2010   Cardiac arrest ([place]) 08/04/2010   Carotid artery occlusion    mild to moderate bilateral internal carotid artery stenosis   Claudication ([place])    Complication of anesthesia    [place]   Coronary artery disease    CABG 2011 in [facility]   Coronary artery disease    Dementia    Depression    DVT (deep venous thrombosis) (HCC)    GERD (gastroesophageal reflux disease)    High cholesterol    Hypertension    Myocardial infarction ([place]) 09/27/10   in [place]   Obesity    Peripheral vascular disease ([place])    bilat SFA diseae   Renal artery stenosis (HCC)    Rotator cuff tendonitis    Sleep apnea    uses CPAP nightly   Stroke [facility]) 2011   Subdural hematoma [facility]) May 25,2013   Frontal subdural  S/P evacuation of hematoma   Type II diabetes mellitus ([place])    Diet and Exercise    Patient Active Problem List   Diagnosis Date Noted   Acute lower UTI 03/10/2021   Sepsis ([place]) 03/10/2021   AKI (acute kidney injury) ([place]) 03/06/2021   OSA (obstructive sleep  apnea) 08/20/2020   Healthcare maintenance 08/20/2020   Chronic cough 05/28/2020   MDD (major depressive disorder) 10/15/2018   Peripheral vascular disease, unspecified ([place]) 03/14/2014   Aftercare following surgery of the circulatory system, NEC 03/14/2014   Occlusion and stenosis of carotid artery without mention of cerebral infarction 02/11/2013   SDH (subdural hematoma) ([place]) 03/02/2012   S/P craniotomy, 02/25/12 after fall, SDH 02/27/2012   Bradycardia 02/27/2012   Obesity 02/27/2012   Diabetes mellitus 02/27/2012   CAD, cardiac arrest, CPR, CABG in [facility] Dec 2011-NL LVF by Echo, neg Nuc April 2013 02/27/2012   Anxiety, ? residual hypoxic brain inhury from cardiac arrest 2011 02/27/2012   Peripheral artery disease, bilta LE PVD by angio April 2013 01/18/2012   HLD (hyperlipidemia) 01/18/2012   HTN (hypertension) 01/18/2012   Renal artery stenosis, Right, 75% 01/18/2012    Past Surgical History:  Procedure Laterality Date   ABDOMINOPLASTY/PANNICULECTOMY     Had wound complication with MRSA after   ATHERECTOMY N/A 01/17/2012   Procedure: ATHERECTOMY;  Surgeon: Lorretta Harp, MD;  Location[facility] CATH LAB;  Service: Cardiovascular;  Laterality: N/A;   BACK SURGERY     CHOLECYSTECTOMY  ~ 1980's   CORONARY ARTERY BYPASS GRAFT  09/2010  CABG X4   CRANIOTOMY  02/25/2012   Procedure: CRANIOTOMY HEMATOMA EVACUATION SUBDURAL;  Surgeon: Eustace Moore, MD;  Location: [facility] NEURO ORS;  Service: Neurosurgery;  Laterality: Left;  Left craniotomy for evacuation of subdural hematoma   LOWER EXTREMITY ANGIOGRAM  April 2013   ORIF TOE FRACTURE Left 04/25/2019   Procedure: Open Reduction Internal Fixation (ORIF) Left Fifth Metatarsal Ronnald Ramp Fracture;  Surgeon: Wylene Simmer, MD;  Location: [facility];  Service: Orthopedics;  Laterality: Left;   OVARIAN CYST REMOVAL  1983   POSTERIOR FUSION LUMBAR SPINE  ` 2000   SPINE SURGERY     stomach and skin tuck  2000    OB History      Gravida  2   Para      Term      Preterm      AB      Living  2      SAB      IAB      Ectopic      Multiple      Live Births               Home Medications    Prior to Admission medications   Medication Sig Start Date End Date Taking? Authorizing Provider  acetaminophen (TYLENOL) 650 MG CR tablet Take 500 mg by mouth in the morning and at bedtime.    [provider]  ALPRAZolam Duanne Moron) 0.5 MG tablet Take 1/2-1 tablet 3 times daily for the next 3-5 days, then take as needed for panic 03/10/21   Thayer Headings, PMHNP  Ascorbic Acid (VITAMIN C) 100 MG tablet Take 100 mg by mouth daily.    [provider]  aspirin EC 81 MG tablet Take 81 mg by mouth daily.    [provider]  atorvastatin (LIPITOR) 80 MG tablet TAKE 1 TABLET BY MOUTH DAILY Patient taking differently: Take 80 mg by mouth daily. 12/23/20   Lorretta Harp, MD  BESIVANCE 0.6 % SUSP Apply 1 drop to eye daily. 02/02/21   [provider]  cephALEXin (KEFLEX) 500 MG capsule Take 500 mg by mouth 2 (two) times daily. 03/19/21   [provider]  chlorpheniramine (CHLOR-TRIMETON) 4 MG tablet Take 1-2 tablets (4-8 mg total) by mouth at bedtime as needed for allergies. 09/02/20   Lauraine Rinne, NP  Cholecalciferol (VITAMIN D3) 1.25 MG (50000 UT) CAPS Take 1 capsule by mouth daily.    [provider]  cholecalciferol (VITAMIN D3) 25 MCG (1000 UNIT) tablet Take 1,000 Units by mouth daily.    [provider]  CHONDROITIN SULFATE PO Take 1,200 mg by mouth in the morning and at bedtime.    [provider]  cilostazol (PLETAL) 100 MG tablet TAKE 1 TABLET BY MOUTH TWICE DAILY BEFORE MEALS Patient taking differently: Take 100 mg by mouth 2 (two) times daily before a meal. 04/16/20   Lorretta Harp, MD  Coenzyme Q10 300 MG CAPS Take 300 mg by mouth daily.    [provider]  estradiol (ESTRACE) 0.1 MG/GM vaginal cream Place 0.5 g vaginally 2 (two)  times a week. Place 0.5g nightly for two weeks then twice a week after Patient taking differently: Place 0.5 g vaginally See admin instructions. Place 0.5g nightly for two weeks then twice a week after 02/04/21   Jaquita Folds, MD  fluticasone [facility]) 50 MCG/ACT nasal spray Place 1 spray into both nostrils daily. 08/20/20   Lauraine Rinne, NP  Glucosamine-Chondroit-Vit C-Mn (GLUCOSAMINE 1500 COMPLEX) CAPS Take 1 capsule by mouth in the morning and at bedtime.    [provider]  irbesartan (AVAPRO) 150 MG tablet TAKE 1 TABLET(150 MG) BY MOUTH DAILY Patient taking differently: Take 150 mg by mouth daily. 01/01/21   Collene Gobble, MD  loratadine (CLARITIN) 10 MG tablet Take 1 tablet (10 mg total) by mouth daily. 05/28/20   Collene Gobble, MD  magnesium oxide (MAG-OX) 400 MG tablet Take 400 mg by mouth daily.    [provider]  MELATONIN PO Take 1 capsule by mouth daily.    [provider]  metFORMIN (GLUCOPHAGE) 1000 MG tablet Take 1,000 mg by mouth 2 (two) times daily with a meal. 01/05/15   [provider]  metoprolol tartrate (LOPRESSOR) 25 MG tablet TAKE 1 TABLET(25 MG) BY MOUTH TWICE DAILY Patient taking differently: Take 25 mg by mouth 2 (two) times daily. 02/02/21   Lorretta Harp, MD  Multiple Vitamin (MULITIVITAMIN WITH MINERALS) TABS Take 1 tablet by mouth daily.    [provider]  Multiple Vitamins-Minerals (ZINC PO) Take 1 tablet by mouth daily.    [provider]  omega-3 fish oil (MAXEPA) 1000 MG CAPS capsule Take 1 capsule by mouth daily. Mega Red    [provider]  OVER THE COUNTER MEDICATION Take 1 capsule by mouth daily. Tart cherry 1000mg     [provider]  OVER THE COUNTER MEDICATION Take 1 capsule by mouth daily. Tatanine 200mg     [provider]  OVER THE COUNTER MEDICATION Take 1 capsule by mouth 2 (two) times daily. N-asetyl-l-cysteine 600 mg    [provider]  pantoprazole  (PROTONIX) 40 MG tablet Take 1 tablet (40 mg total) by mouth 2 (two) times daily. 03/03/21   Collene Gobble, MD  PREBIOTIC PRODUCT PO Take 1 capsule by mouth in the morning and at bedtime. [employer] GOS    [provider]  Probiotic Product (PROBIOTIC-10 PO) Take 1 capsule by mouth daily.    [provider]  QUERCETIN PO Take 1,000 mg by mouth in the morning and at bedtime.    [provider]  REPATHA SURECLICK 073 MG/ML SOAJ ADMINISTER 1 ML UNDER THE SKIN EVERY 14 DAYS Patient taking differently: Inject 140 mg into the skin every 14 (fourteen) days. 03/09/20   Lorretta Harp, MD  sertraline (ZOLOFT) 100 MG tablet Take 2 tablets (200 mg total) by mouth daily. 03/10/21 12/05/21  Thayer Headings, PMHNP  Turmeric 500 MG CAPS Take 1,000 mg by mouth in the morning and at bedtime.    [provider]  vitamin E 180 MG (400 UNITS) capsule Take 400 Units by mouth daily.    [provider]  zinc gluconate 50 MG tablet Take 50 mg by mouth daily.    [provider]    Family History Family History  Problem Relation Age of Onset   Heart disease Mother        before age 2   Diabetes Mother    Hyperlipidemia Mother    Hypertension Mother    Heart attack Mother    Heart disease Father        Before age 90   Hyperlipidemia Father    Hypertension Father    Heart attack Father    Heart disease Sister        Before age 67   Heart attack Sister    Hyperlipidemia Brother    Hypertension Brother  Heart attack Brother    Heart disease Brother        before age 47   Heart disease Other    Diabetes Other    Hypertension Other    Alcohol abuse Other    Mental illness Other     Social History Social History   Tobacco Use   Smoking status: Former    Packs/day: 1.00    Years: 30.00    Pack years: 30.00    Types: Cigarettes    Quit date: 06/23/2003    Years since quitting: 17.7   Smokeless tobacco: Never  Substance Use Topics   Alcohol use: Not  Currently    Alcohol/week: 0.0 standard drinks    Comment: 01/17/12 "used to have glass of wine now & then; last wine was ~ 2011"   Drug use: No     Allergies   [place]   Review of Systems Review of Systems  All other systems reviewed and are negative.   Physical Exam Triage Vital Signs ED Triage Vitals  Enc Vitals Group     BP      Pulse      Resp      Temp      Temp src      SpO2      Weight      Height      Head Circumference      Peak Flow      Pain Score      Pain Loc      Pain Edu?      Excl. in [place]?    No data found.  Updated Vital Signs BP 121/79 (BP Location: Left Arm)   Pulse 94   Temp 98.7 F (37.1 C) (Oral)   Resp 18   SpO2 93%   Visual Acuity Right Eye Distance:   Left Eye Distance:   Bilateral Distance:    Right Eye Near:   Left Eye Near:    Bilateral Near:     Physical Exam Vitals reviewed.  Constitutional:      Appearance: Normal appearance. She is not diaphoretic.  HENT:     Head: Normocephalic and atraumatic.     Mouth/Throat:     Mouth: Mucous membranes are moist.  Eyes:     Extraocular Movements: Extraocular movements intact.     Pupils: Pupils are equal, round, and reactive to light.  Cardiovascular:     Rate and Rhythm: Normal rate and regular rhythm.     Pulses:          Radial pulses are 2+ on the right side and 2+ on the left side.     Heart sounds: Normal heart sounds.  Pulmonary:     Effort: Pulmonary effort is normal.     Breath sounds: Normal breath sounds.  Abdominal:     Palpations: Abdomen is soft.     Tenderness: There is no abdominal tenderness. There is no guarding or rebound.  Musculoskeletal:     Right lower leg: No edema.     Left lower leg: No edema.  Skin:    General: Skin is warm.     Capillary Refill: Capillary refill takes less than 2 seconds.  Neurological:     General: No focal deficit present.     Mental Status: She is alert and oriented to person, place, and time.  Psychiatric:         Mood and Affect: Mood normal.        Behavior:  Behavior normal.        Thought Content: Thought content normal.        Judgment: Judgment normal.     UC Treatments / Results  Labs (all labs ordered are listed, but only abnormal results are displayed) Labs Reviewed - No data to display  EKG   Radiology No results found.  Procedures Procedures (including critical care time)  Medications Ordered in UC Medications - No data to display  Initial Impression / Assessment and Plan / UC Course  I have reviewed the triage vital signs and the nursing notes.  Pertinent labs & imaging results that were available during my care of the patient were reviewed by me and considered in my medical decision making (see chart for details).     This patient is a 65 year old female presenting with concern for hyperkalemia.  She was recently hospitalized for acute kidney injury, subsequently her potassium was 6.5 at her primary care on 6/16.  States that her primary care advised her to come to urgent care and have this rechecked as a rapid lab.  Discussed that we are not able to do this in the urgent care setting, and patient states that she wishes to proceed to the emergency department for this evaluation and management.  EKG unchanged from 6/4 EKG.  Discussed treatment plan with attending physician Dr. Mannie Stabile who is in agreement with this plan.  She is hemodynamically stable for transport in personal vehicle at this time.  Final Clinical Impressions(s) / UC Diagnoses   Final diagnoses:  Hyperkalemia  History of MI (myocardial infarction)  History of kidney injury     Discharge Instructions      -Head straight to the ED for monitoring of your potassium level and treatment if necessary     ED Prescriptions   None    PDMP not reviewed this encounter.   Hazel Sams, PA-C 03/20/21 1510

## 2021-03-20 NOTE — H&P (Signed)
History and Physical    Alicia Crawford ION:[id number] DOB: November 15, 1955 DOA: 03/20/2021  PCP: Eunice Blase, MD  Patient coming from: Home   Chief Complaint: Hyperkalemia   HPI:    65 year old female with past medical history of coronary artery disease (MI and CABG in 2011 in [place]), hypertension, diabetes mellitus type 2, hyperlipidemia, peripheral vascular disease, chronic venous eustachian tube and uterine prolapse who presents to [facility] emergency department at the direction of her primary care provider office due to hyperkalemia.  Patient was recently hospitalized at [facility] from 6/4 till 6/8 for acute kidney injury thought to be prerenal secondary to several weeks of nausea vomiting and diarrhea.  During that hospital stay patient was also noted to have recurrent fevers and was diagnosed with a Klebsiella urinary tract infection, treated with a course of intravenous ceftriaxone while hospitalized.  Patient eventually was discharged on 6/8 with a prescription for Keflex to complete her antibiotic course.  Patient states that she completed her course of antibiotics.  Her nausea vomiting and diarrhea resolved since the completion of her antibiotic therapy.  Although she still has some intermittent mild dysuria.  Patient went to go see her primary care provider on 6/16 for follow-up.  Patient was still abnormal and she was prescribed a course of oral ciprofloxacin.  Otherwise, it was an unremarkable visit.  A repeat chemistry was obtained prior to her going home.  Patient has since been contacted today on 6/18 by her primary care provider's office and was informed that her potassium was 6.5 and that she needed to seek medical attention.  Patient initially went to a local urgent care clinic but was then advised by them to come to the emergency department for evaluation.  Upon evaluation in the emergency department, repeat chemistry reveals that potassium is 6.1.  EKG  was performed revealing no evidence of EKG changes consistent with hyperkalemia.  Patient was hydrated with 1 L of intravenous fluids.  ER provider was also concerned due to mild acute kidney injury with BUN of 30 and creatinine of 1.29.  The hospitalist group was then called to assess the patient for admission to the hospital.  Review of Systems:   Review of Systems  Constitutional:  Positive for malaise/fatigue.  Genitourinary:  Positive for dysuria.  All other systems reviewed and are negative.  Past Medical History:  Diagnosis Date   Ankle fracture 09/05/2016   right ankle   Anoxic brain injury [facility]) 08/2010   "w/cardiac arrest"   Anxiety Jan. 2012   Blood transfusion 09/2010   Cardiac arrest ([place]) 08/04/2010   Carotid artery occlusion    mild to moderate bilateral internal carotid artery stenosis   Claudication ([place])    Complication of anesthesia    [place]   Coronary artery disease    CABG 2011 in [facility]   Coronary artery disease    Dementia    Depression    DVT (deep venous thrombosis) (HCC)    GERD (gastroesophageal reflux disease)    High cholesterol    Hypertension    Myocardial infarction ([place]) 09/27/10   in [email or web address]   Obesity    Peripheral vascular disease (HCC)    bilat SFA diseae   Renal artery stenosis (HCC)    Rotator cuff tendonitis    Sleep apnea    uses CPAP nightly   Stroke [facility]) 2011   Subdural hematoma [facility]) May 25,2013   Frontal subdural  S/P evacuation of hematoma  Type II diabetes mellitus (HCC)    Diet and Exercise    Past Surgical History:  Procedure Laterality Date   ABDOMINOPLASTY/PANNICULECTOMY     Had wound complication with MRSA after   ATHERECTOMY N/A 01/17/2012   Procedure: ATHERECTOMY;  Surgeon: Lorretta Harp, MD;  Location: [facility] CATH LAB;  Service: Cardiovascular;  Laterality: N/A;   BACK SURGERY     CHOLECYSTECTOMY  ~ 1980's   CORONARY ARTERY BYPASS GRAFT  09/2010   CABG X4   CRANIOTOMY  02/25/2012   Procedure:  CRANIOTOMY HEMATOMA EVACUATION SUBDURAL;  Surgeon: Eustace Moore, MD;  Location: [place] NEURO ORS;  Service: Neurosurgery;  Laterality: Left;  Left craniotomy for evacuation of subdural hematoma   LOWER EXTREMITY ANGIOGRAM  April 2013   ORIF TOE FRACTURE Left 04/25/2019   Procedure: Open Reduction Internal Fixation (ORIF) Left Fifth Metatarsal Ronnald Ramp Fracture;  Surgeon: Wylene Simmer, MD;  Location: [place];  Service: Orthopedics;  Laterality: Left;   OVARIAN CYST REMOVAL  1983   POSTERIOR FUSION LUMBAR SPINE  ` 2000   SPINE SURGERY     stomach and skin tuck  2000     reports that she quit smoking about 17 years ago. Her smoking use included cigarettes. She has a 30.00 pack-year smoking history. She has never used smokeless tobacco. She reports previous alcohol use. She reports that she does not use drugs.  Allergies  Allergen Reactions   [place] Hives and Other (See Comments)    Gas used 1980's Reaction: affected her liver    Family History  Problem Relation Age of Onset   Heart disease Mother        before age 68   Diabetes Mother    Hyperlipidemia Mother    Hypertension Mother    Heart attack Mother    Heart disease Father        Before age 91   Hyperlipidemia Father    Hypertension Father    Heart attack Father    Heart disease Sister        Before age 50   Heart attack Sister    Hyperlipidemia Brother    Hypertension Brother    Heart attack Brother    Heart disease Brother        before age 37   Heart disease Other    Diabetes Other    Hypertension Other    Alcohol abuse Other    Mental illness Other      Prior to Admission medications   Medication Sig Start Date End Date Taking? Authorizing Provider  acetaminophen (TYLENOL) 650 MG CR tablet Take 650 mg by mouth every 6 (six) hours as needed for pain.   Yes [provider]  ALPRAZolam (XANAX) 0.5 MG tablet Take 1/2-1 tablet 3 times daily for the next 3-5 days, then take as needed for  panic Patient taking differently: Take 0.25-0.5 mg by mouth 2 (two) times daily as needed for anxiety or sleep. 03/10/21  Yes Thayer Headings, PMHNP  Ascorbic Acid (VITAMIN C) 100 MG tablet Take 100 mg by mouth daily.   Yes [provider]  aspirin EC 81 MG tablet Take 81 mg by mouth daily.   Yes [provider]  atorvastatin (LIPITOR) 80 MG tablet TAKE 1 TABLET BY MOUTH DAILY Patient taking differently: Take 80 mg by mouth daily. 12/23/20  Yes Lorretta Harp, MD  cephALEXin (KEFLEX) 500 MG capsule Take 500 mg by mouth 2 (two) times daily. 03/19/21  Yes [provider]  chlorpheniramine (CHLOR-TRIMETON) 4 MG tablet Take 1-2 tablets (4-8 mg total) by mouth at bedtime as needed for allergies. Patient taking differently: Take 4 mg by mouth at bedtime as needed for allergies. 09/02/20  Yes Lauraine Rinne, NP  Cholecalciferol (VITAMIN D3) 1.25 MG (50000 UT) CAPS Take 1 capsule by mouth daily.   Yes [provider]  CHONDROITIN SULFATE PO Take 1,200 mg by mouth in the morning and at bedtime.   Yes [provider]  cilostazol (PLETAL) 100 MG tablet TAKE 1 TABLET BY MOUTH TWICE DAILY BEFORE MEALS Patient taking differently: Take 100 mg by mouth 2 (two) times daily before a meal. 04/16/20  Yes Lorretta Harp, MD  Coenzyme Q10 300 MG CAPS Take 300 mg by mouth daily.   Yes [provider]  estradiol (ESTRACE) 0.1 MG/GM vaginal cream Place 0.5 g vaginally 2 (two) times a week. Place 0.5g nightly for two weeks then twice a week after Patient taking differently: Place 0.5 g vaginally 2 (two) times a week. 02/04/21  Yes Jaquita Folds, MD  fluticasone [facility]) 50 MCG/ACT nasal spray Place 1 spray into both nostrils daily. Patient taking differently: Place 1 spray into both nostrils daily as needed for allergies. 08/20/20  Yes Lauraine Rinne, NP  irbesartan (AVAPRO) 150 MG tablet TAKE 1 TABLET(150 MG) BY MOUTH DAILY Patient taking differently: Take 150 mg  by mouth daily. 01/01/21  Yes Collene Gobble, MD  loratadine (CLARITIN) 10 MG tablet Take 1 tablet (10 mg total) by mouth daily. 05/28/20  Yes Collene Gobble, MD  magnesium oxide (MAG-OX) 400 MG tablet Take 400 mg by mouth at bedtime.   Yes [provider]  Melatonin 10 MG TABS Take 5 mg by mouth at bedtime as needed (sleep).   Yes [provider]  metFORMIN (GLUCOPHAGE) 1000 MG tablet Take 1,000 mg by mouth 2 (two) times daily with a meal. 01/05/15  Yes [provider]  metoprolol tartrate (LOPRESSOR) 25 MG tablet TAKE 1 TABLET(25 MG) BY MOUTH TWICE DAILY Patient taking differently: Take 25 mg by mouth 2 (two) times daily. 02/02/21  Yes Lorretta Harp, MD  pantoprazole (PROTONIX) 40 MG tablet Take 1 tablet (40 mg total) by mouth 2 (two) times daily. 03/03/21  Yes Collene Gobble, MD  PREBIOTIC PRODUCT PO Take 1 capsule by mouth daily.   Yes [provider]  Probiotic Product (PROBIOTIC-10 PO) Take 1 capsule by mouth daily.   Yes [provider]  QUERCETIN PO Take 1,000 mg by mouth in the morning and at bedtime.   Yes [provider]  REPATHA SURECLICK 761 MG/ML SOAJ ADMINISTER 1 ML UNDER THE SKIN EVERY 14 DAYS Patient taking differently: Inject 140 mg into the skin every 14 (fourteen) days. 03/09/20  Yes Lorretta Harp, MD  sertraline (ZOLOFT) 100 MG tablet Take 2 tablets (200 mg total) by mouth daily. Patient taking differently: Take 100 mg by mouth in the morning and at bedtime. 03/10/21 12/05/21 Yes Thayer Headings, PMHNP  zinc gluconate 50 MG tablet Take 50 mg by mouth daily.   Yes [provider]    Physical Exam: Vitals:   03/20/21 1830 03/20/21 1845 03/20/21 2145 03/20/21 2200  BP: (!) 153/71 (!) 162/71 (!) 156/69 (!) 154/77  Pulse: 68 83 74 66  Resp: 14 (!) 22 18 17   Temp:      TempSrc:      SpO2: 100% 97% 100% 96%    Constitutional: Awake  alert and oriented x3, no associated distress.   Skin: no rashes, no lesions, good  skin turgor noted. Eyes: Pupils are equally reactive to light.  No evidence of scleral icterus or conjunctival pallor.  ENMT: Moist mucous membranes noted.  Posterior pharynx clear of any exudate or lesions.   Neck: normal, supple, no masses, no thyromegaly.  No evidence of jugular venous distension.   Respiratory: clear to auscultation bilaterally, no wheezing, no crackles. Normal respiratory effort. No accessory muscle use.  Cardiovascular: Regular rate and rhythm, no murmurs / rubs / gallops. No extremity edema. 2+ pedal pulses. No carotid bruits.  Chest:   Nontender without crepitus or deformity.   Back:   Nontender without crepitus or deformity. Abdomen: Abdomen is soft and nontender.  No evidence of intra-abdominal masses.  Positive bowel sounds noted in all quadrants.   Musculoskeletal: No joint deformity upper and lower extremities. Good ROM, no contractures. Normal muscle tone.  Neurologic: CN 2-12 grossly intact. Sensation intact.  Patient moving all 4 extremities spontaneously.  Patient is following all commands.  Patient is responsive to verbal stimuli.   Psychiatric: Patient exhibits normal mood with appropriate affect.  Patient seems to possess insight as to their current situation.     Labs on Admission: I have personally reviewed following labs and imaging studies -   CBC: Recent Labs  Lab 03/20/21 2152  HGB 9.9*  HCT 05.3*   Basic Metabolic Panel: Recent Labs  Lab 03/20/21 1451 03/20/21 2100 03/20/21 2152  NA 135  --  137  K 6.1*  --  6.0*  CL 105  --   --   CO2 21*  --   --   GLUCOSE 104*  --   --   BUN 30*  --   --   CREATININE 1.29*  --   --   CALCIUM 9.3  --   --   MG  --  1.6*  --    GFR: CrCl cannot be calculated (Unknown ideal weight.). Liver Function Tests: Recent Labs  Lab 03/20/21 2100  AST 21  ALT 24  ALKPHOS 63  BILITOT 0.6  PROT 7.3  ALBUMIN 3.3*   No results for input(s): LIPASE, AMYLASE in the last 168 hours. No results for  input(s): AMMONIA in the last 168 hours. Coagulation Profile: No results for input(s): INR, PROTIME in the last 168 hours. Cardiac Enzymes: No results for input(s): CKTOTAL, CKMB, CKMBINDEX, TROPONINI in the last 168 hours. BNP (last 3 results) No results for input(s): PROBNP in the last 8760 hours. HbA1C: No results for input(s): HGBA1C in the last 72 hours. CBG: No results for input(s): GLUCAP in the last 168 hours. Lipid Profile: No results for input(s): CHOL, HDL, LDLCALC, TRIG, CHOLHDL, LDLDIRECT in the last 72 hours. Thyroid Function Tests: No results for input(s): TSH, T4TOTAL, FREET4, T3FREE, THYROIDAB in the last 72 hours. Anemia Panel: No results for input(s): VITAMINB12, FOLATE, FERRITIN, TIBC, IRON, RETICCTPCT in the last 72 hours. Urine analysis:    Component Value Date/Time   COLORURINE YELLOW 03/20/2021 2000   APPEARANCEUR CLEAR 03/20/2021 2000   LABSPEC 1.014 03/20/2021 2000   PHURINE 5.0 03/20/2021 2000   GLUCOSEU NEGATIVE 03/20/2021 2000   HGBUR NEGATIVE 03/20/2021 2000   BILIRUBINUR NEGATIVE 03/20/2021 2000   KETONESUR NEGATIVE 03/20/2021 2000   PROTEINUR NEGATIVE 03/20/2021 2000   UROBILINOGEN 0.2 04/28/2011 2059   NITRITE NEGATIVE 03/20/2021 2000   LEUKOCYTESUR NEGATIVE 03/20/2021 2000    Radiological Exams on Admission - Personally Reviewed:  No results found.  EKG: Personally reviewed.  Rhythm is normal sinus rhythm with heart rate of 83 bpm.  No dynamic ST segment changes appreciated.  Assessment/Plan Principal Problem:   Hyperkalemia  Patient presenting with modest hyperkalemia without EKG changes Hyperkalemia is possibly secondary to mild renal insufficiency/injury but could also be secondary to ongoing irbesartan use which can cause hyperkalemia in a significant number of patients.   Hydrating patient with intravenous isotonic fluids Monitoring patient on telemetry Administering 10 g of Lokelma. Serial chemistries to ensure downtrending of  potassium levels. Holding home regimen of irbesartan and switching patient to low-dose amlodipine for blood pressure control instead.  Active Problems:   Acute renal failure superimposed on stage 2 chronic kidney disease (HCC)  Notable mild acute kidney injury with rising creatinine of 0.3 since discharge with associated mild rising BUN Hydrating patient with intravenous isotonic fluids Strict input and output monitoring Monitoring renal function and electrolytes with serial chemistries    HLD (hyperlipidemia)  Continuing home regimen of lipid lowering therapy.    Essential hypertension  Irbesartan has been discontinued in case it is contributing to patient's hyperkalemia Patient is been transitioned to low-dose amlodipine 5 mg daily. Continuing home regimen of low-dose metoprolol. As needed intravenous antihypertensives for markedly elevated blood pressure.    Coronary artery disease involving native heart without angina pectoris  Patient is currently chest pain-free Monitoring patient on telemetry Continuing home regimen of aspirin, beta-blocker therapy and statin therapy  Asymptomatic bacteriuria  Patient reports that her primary care provider did a urinalysis last Thursday that showed that she "still had a urinary tract infection." Patient is currently asymptomatic Urinalysis here unremarkable Will abstain from continuing antibiotics for now.    Code Status:  Full code Family Communication: deferred   Status is: Observation  The patient remains OBS appropriate and will d/c before 2 midnights.  Dispo: The patient is from: Home              Anticipated d/c is to: Home              Patient currently is not medically stable to d/c.   Difficult to place patient No        Vernelle Emerald MD [facility] Pager [phone]  If 7PM-7AM, please contact night-coverage [email or web address] Use universal [place] password for that web site. If you do not have  the password, please call the hospital operator.  03/20/2021, 11:33 PM

## 2021-03-21 DIAGNOSIS — E1122 Type 2 diabetes mellitus with diabetic chronic kidney disease: Secondary | ICD-10-CM | POA: Diagnosis present

## 2021-03-21 DIAGNOSIS — Z6831 Body mass index (BMI) 31.0-31.9, adult: Secondary | ICD-10-CM | POA: Diagnosis not present

## 2021-03-21 DIAGNOSIS — E875 Hyperkalemia: Secondary | ICD-10-CM | POA: Diagnosis present

## 2021-03-21 DIAGNOSIS — I2510 Atherosclerotic heart disease of native coronary artery without angina pectoris: Secondary | ICD-10-CM | POA: Diagnosis present

## 2021-03-21 DIAGNOSIS — I10 Essential (primary) hypertension: Secondary | ICD-10-CM | POA: Diagnosis not present

## 2021-03-21 DIAGNOSIS — Z87440 Personal history of urinary (tract) infections: Secondary | ICD-10-CM | POA: Diagnosis not present

## 2021-03-21 DIAGNOSIS — I252 Old myocardial infarction: Secondary | ICD-10-CM | POA: Diagnosis not present

## 2021-03-21 DIAGNOSIS — E1151 Type 2 diabetes mellitus with diabetic peripheral angiopathy without gangrene: Secondary | ICD-10-CM | POA: Diagnosis present

## 2021-03-21 DIAGNOSIS — E782 Mixed hyperlipidemia: Secondary | ICD-10-CM | POA: Diagnosis not present

## 2021-03-21 DIAGNOSIS — Z87891 Personal history of nicotine dependence: Secondary | ICD-10-CM | POA: Diagnosis not present

## 2021-03-21 DIAGNOSIS — F32A Depression, unspecified: Secondary | ICD-10-CM | POA: Diagnosis present

## 2021-03-21 DIAGNOSIS — N179 Acute kidney failure, unspecified: Secondary | ICD-10-CM | POA: Diagnosis present

## 2021-03-21 DIAGNOSIS — E669 Obesity, unspecified: Secondary | ICD-10-CM | POA: Diagnosis present

## 2021-03-21 DIAGNOSIS — Z981 Arthrodesis status: Secondary | ICD-10-CM | POA: Diagnosis not present

## 2021-03-21 DIAGNOSIS — Z20822 Contact with and (suspected) exposure to covid-19: Secondary | ICD-10-CM | POA: Diagnosis present

## 2021-03-21 DIAGNOSIS — Z86718 Personal history of other venous thrombosis and embolism: Secondary | ICD-10-CM | POA: Diagnosis not present

## 2021-03-21 DIAGNOSIS — D72829 Elevated white blood cell count, unspecified: Secondary | ICD-10-CM | POA: Diagnosis present

## 2021-03-21 DIAGNOSIS — I129 Hypertensive chronic kidney disease with stage 1 through stage 4 chronic kidney disease, or unspecified chronic kidney disease: Secondary | ICD-10-CM | POA: Diagnosis present

## 2021-03-21 DIAGNOSIS — Z8673 Personal history of transient ischemic attack (TIA), and cerebral infarction without residual deficits: Secondary | ICD-10-CM | POA: Diagnosis not present

## 2021-03-21 DIAGNOSIS — Z951 Presence of aortocoronary bypass graft: Secondary | ICD-10-CM | POA: Diagnosis not present

## 2021-03-21 DIAGNOSIS — F419 Anxiety disorder, unspecified: Secondary | ICD-10-CM | POA: Diagnosis present

## 2021-03-21 DIAGNOSIS — Z8674 Personal history of sudden cardiac arrest: Secondary | ICD-10-CM | POA: Diagnosis not present

## 2021-03-21 DIAGNOSIS — E785 Hyperlipidemia, unspecified: Secondary | ICD-10-CM | POA: Diagnosis present

## 2021-03-21 DIAGNOSIS — N182 Chronic kidney disease, stage 2 (mild): Secondary | ICD-10-CM | POA: Diagnosis present

## 2021-03-21 DIAGNOSIS — N814 Uterovaginal prolapse, unspecified: Secondary | ICD-10-CM | POA: Diagnosis present

## 2021-03-21 LAB — COMPREHENSIVE METABOLIC PANEL
ALT: 25 U/L (ref 0–44)
AST: 18 U/L (ref 15–41)
Albumin: 3.2 g/dL — ABNORMAL LOW (ref 3.5–5.0)
Alkaline Phosphatase: 61 U/L (ref 38–126)
Anion gap: 6 (ref 5–15)
BUN: 25 mg/dL — ABNORMAL HIGH (ref 8–23)
CO2: 23 mmol/L (ref 22–32)
Calcium: 9.2 mg/dL (ref 8.9–10.3)
Chloride: 107 mmol/L (ref 98–111)
Creatinine, Ser: 1.18 mg/dL — ABNORMAL HIGH (ref 0.44–1.00)
GFR, Estimated: 51 mL/min — ABNORMAL LOW (ref >60)
Glucose, Bld: 90 mg/dL (ref 70–99)
Potassium: 5.5 mmol/L — ABNORMAL HIGH (ref 3.5–5.1)
Sodium: 136 mmol/L (ref 135–145)
Total Bilirubin: 0.7 mg/dL (ref 0.3–1.2)
Total Protein: 6.7 g/dL (ref 6.5–8.1)

## 2021-03-21 LAB — CBC WITH DIFFERENTIAL/PLATELET
Abs Immature Granulocytes: 0.05 10*3/uL (ref 0.00–0.07)
Basophils Absolute: 0.1 10*3/uL (ref 0.0–0.1)
Basophils Relative: 1 %
Eosinophils Absolute: 0.1 10*3/uL (ref 0.0–0.5)
Eosinophils Relative: 1 %
HCT: 30.6 % — ABNORMAL LOW (ref 36.0–46.0)
Hemoglobin: 9.4 g/dL — ABNORMAL LOW (ref 12.0–15.0)
Immature Granulocytes: 1 %
Lymphocytes Relative: 44 %
Lymphs Abs: 2.9 10*3/uL (ref 0.7–4.0)
MCH: 28.8 pg (ref 26.0–34.0)
MCHC: 30.7 g/dL (ref 30.0–36.0)
MCV: 93.9 fL (ref 80.0–100.0)
Monocytes Absolute: 0.4 10*3/uL (ref 0.1–1.0)
Monocytes Relative: 7 %
Neutro Abs: 3 10*3/uL (ref 1.7–7.7)
Neutrophils Relative %: 46 %
Platelets: 496 10*3/uL — ABNORMAL HIGH (ref 150–400)
RBC: 3.26 MIL/uL — ABNORMAL LOW (ref 3.87–5.11)
RDW: 14.6 % (ref 11.5–15.5)
WBC: 6.5 10*3/uL (ref 4.0–10.5)
nRBC: 0 % (ref 0.0–0.2)

## 2021-03-21 LAB — SARS CORONAVIRUS 2 (TAT 6-24 HRS): SARS Coronavirus 2: NEGATIVE

## 2021-03-21 LAB — GLUCOSE, CAPILLARY
Glucose-Capillary: 77 mg/dL (ref 70–99)
Glucose-Capillary: 144 mg/dL — ABNORMAL HIGH (ref 70–99)
Glucose-Capillary: 122 mg/dL — ABNORMAL HIGH (ref 70–99)
Glucose-Capillary: 104 mg/dL — ABNORMAL HIGH (ref 70–99)

## 2021-03-21 LAB — MAGNESIUM: Magnesium: 1.5 mg/dL — ABNORMAL LOW (ref 1.7–2.4)

## 2021-03-21 MED ORDER — MAGNESIUM SULFATE 2 GM/50ML IV SOLN
2.0000 g | Freq: Once | INTRAVENOUS | Status: AC
  Administered 2021-03-21: 2 g via INTRAVENOUS
  Filled 2021-03-21: qty 50

## 2021-03-21 MED ORDER — SODIUM ZIRCONIUM CYCLOSILICATE 10 G PO PACK
10.0000 g | PACK | Freq: Three times a day (TID) | ORAL | Status: AC
  Administered 2021-03-21 (×3): 10 g via ORAL
  Filled 2021-03-21 (×4): qty 1

## 2021-03-21 NOTE — Progress Notes (Addendum)
PROGRESS NOTE    Alicia Crawford  HYQ:[id number] DOB: 28-Sep-1956 DOA: 03/20/2021 PCP: Eunice Blase, MD    Brief Narrative:  Alicia Crawford was admitted to the hospital with the working diagnosis of AKI on CKD stage 2, complicated with hyperkalemia.   65 year old female with past medical history for coronary artery disease, hypertension, type 2 diabetes mellitus, dyslipidemia, peripheral vascular disease, and obesity class I who presented with hyperkalemia.  Recent hospitalization 03/06/2021 till 03/10/2021 for Klebsiella urinary tract infection, complicated with acute kidney injury.  Treated with ceftriaxone and transition to oral cephalexin.  Follow-up outpatient visit 6/16 she had a new prescription for ciprofloxacin due to persistent dysuria, chemistry was performed showing hyperkalemia prompted her to be referred to the hospital.  On her physical examination her blood pressure was 153/71, heart rate 68, respiratory rate 18, oxygen saturation 97%, her lungs were clear to auscultation bilaterally, heart S1-S2, present, rhythmic soft abdomen and no lower extremity edema.  Sodium 135, potassium 6.1, chloride 105, bicarb 21, glucose 104, BUN 30, creatinine 1.29, magnesium 1.6, AST 21, ALT 24, hemoglobin 9.1, hematocrit 29.0. SARS COVID-19 negative. Urinalysis specific gravity 1.014, negative protein, negative nitrates, urinary sodium 41, urinary osmolarity 429.  EKG 83 bpm, normal axis, normal intervals, sinus rhythm, no significant ST segment or T wave changes, low voltage.  Assessment & Plan:   Principal Problem:   Hyperkalemia Active Problems:   HLD (hyperlipidemia)   Essential hypertension   Acute renal failure superimposed on stage 2 chronic kidney disease (HCC)   Coronary artery disease involving native heart without angina pectoris   AKI on CKD stage 2 complicated with hyperkalemia and hypomagnsemia. K continue to be elevated at 5,5 with serum cr at 1,18 and bicarbonate at 23. Mg is  1,5.   Plan to continue gentle hydration with isotonic saline, and add 3 more doses of sodium zirconium 10 mg. Add 2 g Mag sulfate.  Follow up renal function and electrolytes in am. Hold ARB and antibiotic therapy.  Renal US from 06/22 with no hydronephrosis.   2. HTN/ dyslipidemia. Blood pressure today is 952 mmHg systolic. Continue blood pressure control with amlodipine plus metoprolol and continue to hold on losartan.  Continue with atorvastatin.   3. CAD / PVD patient with no chest pain. Continue with cilostazole and aspirin.   4. Recent urinary tract infection. Urine infection treated with antibiotic therapy, hold on on further antibiotics for now.  Patient has uterine prolapse.   5. Depression and anxiety/ obesity class 1. Continue with alprazolam and sertraline.  Calculated BMI 31.05  6. T2DM. Continue glucose cover and monitoring with insulin sliding scale.   Patient continue to be at high risk for worsening hyperkalemia   Status is: Observation  The patient will require care spanning > 2 midnights and should be moved to inpatient because: Inpatient level of care appropriate due to severity of illness  Dispo: The patient is from: Home              Anticipated d/c is to: Home              Patient currently is not medically stable to d/c.   Difficult to place patient No   DVT prophylaxis: Enoxaparin   Code Status:   full  Family Communication:  No family at the bedside       Subjective: Patient is feeling better, no nausea or vomiting, her K continue to be elevated and not back to baseline.   Objective: Vitals:  03/20/21 2200 03/21/21 0012 03/21/21 0100 03/21/21 0546  BP: (!) 154/77 (!) 153/71  130/62  Pulse: 66 70  77  Resp: 17 18  18   Temp:  98 F (36.7 C)  98 F (36.7 C)  TempSrc:  Oral  Oral  SpO2: 96% 97%  96%  Weight:   79.5 kg   Height:   5\' 3"  (1.6 m)     Intake/Output Summary (Last 24 hours) at 03/21/2021 1034 Last data filed at 03/21/2021  2585 Gross per 24 hour  Intake 1491.5 ml  Output 800 ml  Net 691.5 ml   Filed Weights   03/21/21 0100  Weight: 79.5 kg    Examination:   General: Not in pain or dyspnea  Neurology: Awake and alert, non focal  E ENT: no pallor, no icterus, oral mucosa moist Cardiovascular: No JVD. S1-S2 present, rhythmic, no gallops, rubs, or murmurs. No lower extremity edema. Pulmonary: positive breath sounds bilaterally, adequate air movement, no wheezing, rhonchi or rales. Gastrointestinal. Abdomen soft and non tender Skin. No rashes Musculoskeletal: no joint deformities     Data Reviewed: I have personally reviewed following labs and imaging studies  CBC: Recent Labs  Lab 03/20/21 2152 03/21/21 0051  WBC  --  6.5  NEUTROABS  --  3.0  HGB 9.9* 9.4*  HCT 29.0* 30.6*  MCV  --  93.9  PLT  --  277*   Basic Metabolic Panel: Recent Labs  Lab 03/20/21 1451 03/20/21 2100 03/20/21 2152 03/21/21 0051  NA 135  --  137 136  K 6.1*  --  6.0* 5.5*  CL 105  --   --  107  CO2 21*  --   --  23  GLUCOSE 104*  --   --  90  BUN 30*  --   --  25*  CREATININE 1.29*  --   --  1.18*  CALCIUM 9.3  --   --  9.2  MG  --  1.6*  --  1.5*   GFR: Estimated Creatinine Clearance: 46.2 mL/min (A) (by C-G formula based on SCr of 1.18 mg/dL (H)). Liver Function Tests: Recent Labs  Lab 03/20/21 2100 03/21/21 0051  AST 21 18  ALT 24 25  ALKPHOS 63 61  BILITOT 0.6 0.7  PROT 7.3 6.7  ALBUMIN 3.3* 3.2*   No results for input(s): LIPASE, AMYLASE in the last 168 hours. No results for input(s): AMMONIA in the last 168 hours. Coagulation Profile: No results for input(s): INR, PROTIME in the last 168 hours. Cardiac Enzymes: No results for input(s): CKTOTAL, CKMB, CKMBINDEX, TROPONINI in the last 168 hours. BNP (last 3 results) No results for input(s): PROBNP in the last 8760 hours. HbA1C: No results for input(s): HGBA1C in the last 72 hours. CBG: Recent Labs  Lab 03/21/21 0615  GLUCAP 77    Lipid Profile: No results for input(s): CHOL, HDL, LDLCALC, TRIG, CHOLHDL, LDLDIRECT in the last 72 hours. Thyroid Function Tests: No results for input(s): TSH, T4TOTAL, FREET4, T3FREE, THYROIDAB in the last 72 hours. Anemia Panel: No results for input(s): VITAMINB12, FOLATE, FERRITIN, TIBC, IRON, RETICCTPCT in the last 72 hours.    Radiology Studies: I have reviewed all of the imaging during this hospital visit personally     Scheduled Meds:  amLODipine  5 mg Oral Daily   aspirin EC  81 mg Oral Daily   atorvastatin  80 mg Oral Daily   cilostazol  100 mg Oral BID AC   enoxaparin (LOVENOX) injection  40 mg Subcutaneous Q24H   insulin aspart  0-15 Units Subcutaneous TID AC & HS   loratadine  10 mg Oral Daily   metoprolol tartrate  25 mg Oral BID   pantoprazole  40 mg Oral BID   sertraline  100 mg Oral BID   sodium zirconium cyclosilicate  10 g Oral TID   Continuous Infusions:  sodium chloride 100 mL/hr at 03/21/21 0405   magnesium sulfate bolus IVPB       LOS: 0 days        Teale Goodgame Gerome Apley, MD

## 2021-03-22 DIAGNOSIS — I10 Essential (primary) hypertension: Secondary | ICD-10-CM | POA: Diagnosis not present

## 2021-03-22 DIAGNOSIS — I2510 Atherosclerotic heart disease of native coronary artery without angina pectoris: Secondary | ICD-10-CM

## 2021-03-22 DIAGNOSIS — E875 Hyperkalemia: Secondary | ICD-10-CM | POA: Diagnosis not present

## 2021-03-22 DIAGNOSIS — N179 Acute kidney failure, unspecified: Secondary | ICD-10-CM | POA: Diagnosis not present

## 2021-03-22 LAB — BASIC METABOLIC PANEL
Anion gap: 9 (ref 5–15)
BUN: 15 mg/dL (ref 8–23)
CO2: 24 mmol/L (ref 22–32)
Calcium: 8.8 mg/dL — ABNORMAL LOW (ref 8.9–10.3)
Chloride: 106 mmol/L (ref 98–111)
Creatinine, Ser: 1.14 mg/dL — ABNORMAL HIGH (ref 0.44–1.00)
GFR, Estimated: 53 mL/min — ABNORMAL LOW (ref >60)
Glucose, Bld: 107 mg/dL — ABNORMAL HIGH (ref 70–99)
Potassium: 3.9 mmol/L (ref 3.5–5.1)
Sodium: 139 mmol/L (ref 135–145)

## 2021-03-22 LAB — GLUCOSE, CAPILLARY
Glucose-Capillary: 111 mg/dL — ABNORMAL HIGH (ref 70–99)
Glucose-Capillary: 114 mg/dL — ABNORMAL HIGH (ref 70–99)

## 2021-03-22 MED ORDER — CHLORPHENIRAMINE MALEATE 4 MG PO TABS: 4.0000 mg | ORAL_TABLET | Freq: Every evening | ORAL | Status: DC | PRN

## 2021-03-22 MED ORDER — AMLODIPINE BESYLATE 5 MG PO TABS: 5.0000 mg | ORAL_TABLET | Freq: Every day | ORAL | 0 refills | Status: DC

## 2021-03-22 MED ORDER — SERTRALINE HCL 100 MG PO TABS: 100.0000 mg | ORAL_TABLET | Freq: Two times a day (BID) | ORAL | Status: DC

## 2021-03-22 MED ORDER — FLUTICASONE PROPIONATE 50 MCG/ACT NA SUSP: 1.0000 | Freq: Every day | NASAL | Status: DC | PRN

## 2021-03-22 MED ORDER — ESTRADIOL 0.1 MG/GM VA CREA: 0.5000 g | TOPICAL_CREAM | VAGINAL | Status: DC

## 2021-03-22 NOTE — Progress Notes (Signed)
RN gave pt discharge instructions and pt stated understanding, IV has been removed and the patient is dressed new medication escribed to home pharmacy.
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
## 2021-03-23 ENCOUNTER — Telehealth: Payer: Self-pay | Admitting: Obstetrics and Gynecology

## 2021-03-23 ENCOUNTER — Telehealth: Payer: Self-pay | Admitting: Cardiovascular Disease

## 2021-03-23 NOTE — Telephone Encounter (Signed)
PT is requesting a callback.She wants the doctors opinion of she was in the hospital and they changed her BP medication.

## 2021-03-23 NOTE — Telephone Encounter (Signed)
Pt called because she was confused about her discharge instructions. Per AVS, pt was instructed to discontinue irbesartan 150 MG tablet and start amlodipine 5 mg. Pt verbalized understanding.

## 2021-03-24 NOTE — Telephone Encounter (Signed)
Pt called stating that she has been hospitalized twice due to infections related to her prolapse. Pt was given a sooner appointment than the one she originally had scheduled.

## 2021-03-26 ENCOUNTER — Telehealth: Payer: Self-pay | Admitting: Cardiovascular Disease

## 2021-03-26 NOTE — Telephone Encounter (Signed)
Pt notified to take both medications, verbalized understanding

## 2021-03-26 NOTE — Telephone Encounter (Signed)
New message:   Pt says she have been prescribed two blood pressure medicine. She was already taking  Metoprolol prescribed by Dr [name] and while in the hospital she was put on Amlodipine. She wants to know if it is alright for her to take both?

## 2021-03-29 ENCOUNTER — Other Ambulatory Visit: Payer: Self-pay | Admitting: Pulmonary Disease

## 2021-03-29 DIAGNOSIS — I10 Essential (primary) hypertension: Secondary | ICD-10-CM | POA: Diagnosis not present

## 2021-03-29 DIAGNOSIS — R053 Chronic cough: Secondary | ICD-10-CM

## 2021-03-29 DIAGNOSIS — E875 Hyperkalemia: Secondary | ICD-10-CM | POA: Diagnosis not present

## 2021-03-29 DIAGNOSIS — Z09 Encounter for follow-up examination after completed treatment for conditions other than malignant neoplasm: Secondary | ICD-10-CM | POA: Diagnosis not present

## 2021-04-07 ENCOUNTER — Encounter: Payer: Self-pay | Admitting: Cardiovascular Disease

## 2021-04-07 ENCOUNTER — Ambulatory Visit: Payer: PPO | Admitting: Cardiovascular Disease

## 2021-04-07 ENCOUNTER — Other Ambulatory Visit: Payer: Self-pay

## 2021-04-07 VITALS — BP 146/70 | HR 104 | Ht 63.0 in | Wt 174.8 lb

## 2021-04-07 DIAGNOSIS — I10 Essential (primary) hypertension: Secondary | ICD-10-CM | POA: Diagnosis not present

## 2021-04-07 DIAGNOSIS — I739 Peripheral vascular disease, unspecified: Secondary | ICD-10-CM | POA: Diagnosis not present

## 2021-04-07 DIAGNOSIS — I2510 Atherosclerotic heart disease of native coronary artery without angina pectoris: Secondary | ICD-10-CM

## 2021-04-07 DIAGNOSIS — E782 Mixed hyperlipidemia: Secondary | ICD-10-CM | POA: Diagnosis not present

## 2021-04-07 NOTE — Progress Notes (Signed)
04/07/2021 Alicia Crawford   1956-03-15  [id number]  Primary Physician Eunice Blase, MD Primary Cardiologist: Lorretta Harp MD FACP, [place], [place], [place]  HPI:  Alicia Crawford is a 65 y.o.  moderately overweight, married Caucasian female, mother of 3 who I last saw in the office 02/21/2020.  She has a history of remote tobacco abuse having quit 10 years ago, hypertension, hyperlipidemia and a family history of heart disease with both mother and father who had CAD and a brother who had a stent. She had an episode of witnessed cardiac arrest, CPR, defibrillation and ultimately coronary artery bypass grafting in [facility] September 27, 2010. She had prolonged hospitalization on a ventilator and rehab. Echo performed April 2012 showed normal LV systolic function. A Myoview was normal as well. She otherwise is asymptomatic except for lifestyle-limiting claudication. Dopplers performed in our office December 06, 2011, revealed ABIs of 0.46 on the right with an occluded SFA and high-grade distal right common, profunda stenosis, left ABI of 0.62 with an occluded left SFA. I angiogram'd her January 17, 2012, demonstrating occluded SFAs bilaterally with 2-vessel runoff on the right, 1 on the left via peroneal. She did have a 95% calcified ostial profunda femoris stenosis on the right jeopardizing collaterals to her infrapopliteal vessels as well as a 60% distal left common femoral artery stenosis. She also had an incidentally noted 35% right renal artery stenosis. I reviewed the films with Dr. [name] who felt surgical revascularization was optimal. She in the interim developed a subdural hematoma after falling and hitting her head and her workup was put on hold.  She later saw Dr. [name] back in the office who recommended continued medical therapy.  She does have severe bladder prolapse and has been hospitalized recently for UTI and metabolic abnormalities.  She wishes to have prolapse surgery.  She continues  to deny chest pain, shortness of breath or claudication.  Current Meds  Medication Sig   acetaminophen (TYLENOL) 650 MG CR tablet Take 650 mg by mouth every 6 (six) hours as needed for pain.   ALPRAZolam (XANAX) 0.5 MG tablet Take 1/2-1 tablet 3 times daily for the next 3-5 days, then take as needed for panic (Patient taking differently: Take 0.25-0.5 mg by mouth 2 (two) times daily as needed for anxiety or sleep.)   amLODipine (NORVASC) 5 MG tablet Take 1 tablet (5 mg total) by mouth daily.   Ascorbic Acid (VITAMIN C) 100 MG tablet Take 100 mg by mouth daily.   aspirin EC 81 MG tablet Take 81 mg by mouth daily.   atorvastatin (LIPITOR) 80 MG tablet TAKE 1 TABLET BY MOUTH DAILY (Patient taking differently: Take 80 mg by mouth daily.)   chlorpheniramine (CHLOR-TRIMETON) 4 MG tablet Take 1 tablet (4 mg total) by mouth at bedtime as needed for allergies.   Cholecalciferol (VITAMIN D3) 1.25 MG (50000 UT) CAPS Take 1 capsule by mouth daily.   CHONDROITIN SULFATE PO Take 1,200 mg by mouth in the morning and at bedtime.   cilostazol (PLETAL) 100 MG tablet TAKE 1 TABLET BY MOUTH TWICE DAILY BEFORE MEALS (Patient taking differently: Take 100 mg by mouth 2 (two) times daily before a meal.)   Coenzyme Q10 300 MG CAPS Take 300 mg by mouth daily.   estradiol (ESTRACE) 0.1 MG/GM vaginal cream Place 0.5 g vaginally 2 (two) times a week.   fluticasone (FLONASE) 50 MCG/ACT nasal spray Place 1 spray into both nostrils daily as needed for allergies.  loratadine (CLARITIN) 10 MG tablet Take 1 tablet (10 mg total) by mouth daily.   magnesium oxide (MAG-OX) 400 MG tablet Take 400 mg by mouth at bedtime.   Melatonin 10 MG TABS Take 5 mg by mouth at bedtime as needed (sleep).   metFORMIN (GLUCOPHAGE) 1000 MG tablet Take 1,000 mg by mouth 2 (two) times daily with a meal.   metoprolol tartrate (LOPRESSOR) 25 MG tablet TAKE 1 TABLET(25 MG) BY MOUTH TWICE DAILY (Patient taking differently: Take 25 mg by mouth 2 (two)  times daily.)   pantoprazole (PROTONIX) 40 MG tablet Take 1 tablet (40 mg total) by mouth 2 (two) times daily.   PREBIOTIC PRODUCT PO Take 1 capsule by mouth daily.   Probiotic Product (PROBIOTIC-10 PO) Take 1 capsule by mouth daily.   QUERCETIN PO Take 1,000 mg by mouth in the morning and at bedtime.   REPATHA SURECLICK 500 MG/ML SOAJ ADMINISTER 1 ML UNDER THE SKIN EVERY 14 DAYS (Patient taking differently: Inject 140 mg into the skin every 14 (fourteen) days.)   sertraline (ZOLOFT) 100 MG tablet Take 1 tablet (100 mg total) by mouth in the morning and at bedtime.   zinc gluconate 50 MG tablet Take 50 mg by mouth daily.     Allergies  Allergen Reactions   Halothane Hives and Other (See Comments)    Gas used 1980's Reaction: affected her liver    Social History   Socioeconomic History   Marital status: Married    Spouse name: Not on file   Number of children: Not on file   Years of education: Not on file   Highest education level: Not on file  Occupational History   Not on file  Tobacco Use   Smoking status: Former    Packs/day: 1.00    Years: 30.00    Pack years: 30.00    Types: Cigarettes    Quit date: 06/23/2003    Years since quitting: 17.8   Smokeless tobacco: Never  Vaping Use   Vaping Use: Not on file  Substance and Sexual Activity   Alcohol use: Not Currently    Alcohol/week: 0.0 standard drinks    Comment: 01/17/12 "used to have glass of wine now & then; last wine was ~ 2011"   Drug use: No   Sexual activity: Yes    Birth control/protection: None  Other Topics Concern   Not on file  Social History Narrative   Not on file   Social Determinants of Health   Financial Resource Strain: Not on file  Food Insecurity: Not on file  Transportation Needs: Not on file  Physical Activity: Not on file  Stress: Not on file  Social Connections: Not on file  Intimate Partner Violence: Not on file     Review of Systems: General: negative for chills, fever, night  sweats or weight changes.  Cardiovascular: negative for chest pain, dyspnea on exertion, edema, orthopnea, palpitations, paroxysmal nocturnal dyspnea or shortness of breath Dermatological: negative for rash Respiratory: negative for cough or wheezing Urologic: negative for hematuria Abdominal: negative for nausea, vomiting, diarrhea, bright red blood per rectum, melena, or hematemesis Neurologic: negative for visual changes, syncope, or dizziness All other systems reviewed and are otherwise negative except as noted above.    Blood pressure (!) 146/70, pulse (!) 104, height 5\' 3"  (1.6 m), weight 174 lb 12.8 oz (79.3 kg), SpO2 97 %.  General appearance: alert and no distress Neck: no adenopathy, no carotid bruit, no JVD, supple, symmetrical, trachea midline,  and thyroid not enlarged, symmetric, no tenderness/mass/nodules Lungs: clear to auscultation bilaterally Heart: regular rate and rhythm, S1, S2 normal, no murmur, click, rub or gallop Extremities: extremities normal, atraumatic, no cyanosis or edema Pulses: Absent pedal pulses Skin: Skin color, texture, turgor normal. No rashes or lesions Neurologic: Grossly normal  EKG sinus tachycardia 104 with PACs and nonspecific ST and T wave changes.  Personally reviewed this EKG.  ASSESSMENT AND PLAN:   Peripheral artery disease, bilta LE PVD by angio April 2013 History of peripheral arterial disease with angiography performed by myself 01/17/2012 revealing occluded SFAs bilaterally with two-vessel runoff on the right and 1 on the left via peroneal artery.  She did have a 95% calcified ostial profunda femoris stenosis on the right jeopardizing collaterals.  I reviewed her films with Dr. [name] who felt that she was a surgical candidate however she ultimately developed a subdural hematoma and follow-up with Dr. [name] after that suggesting medical therapy.  She denies claudication.  HLD (hyperlipidemia) History of hyperlipidemia on statin  therapy with recent lipid profile performed 03/02/2021 revealing total cholesterol 118, LDL 50 and HDL 50.  CAD, cardiac arrest, CPR, CABG in [facility] Dec 2011-NL LVF by Echo, neg Nuc April 2013 History of CAD status post witnessed cardiac arrest with CPR, defibrillation and urgent bypass grafting in [facility] 09/27/2010.  She had a prolonged hospitalization.  Echo performed April 2012 showed normal LV systolic function and Myoview (02/12/2013) was normal as well.  She denies chest pain or shortness of breath.  Occlusion and stenosis of carotid artery without mention of cerebral infarction History of moderate bilateral ICA stenosis by duplex ultrasound recently performed 06/02/2020.  She does have bilateral carotid bruits.  We will repeat carotid Dopplers next month.     Lorretta Harp MD [place], [place] 04/07/2021 12:00 PM

## 2021-04-07 NOTE — Assessment & Plan Note (Signed)
History of hyperlipidemia on statin therapy with recent lipid profile performed 03/02/2021 revealing total cholesterol 118, LDL 50 and HDL 50.

## 2021-04-07 NOTE — Assessment & Plan Note (Signed)
History of peripheral arterial disease with angiography performed by myself 01/17/2012 revealing occluded SFAs bilaterally with two-vessel runoff on the right and 1 on the left via peroneal artery.  She did have a 95% calcified ostial profunda femoris stenosis on the right jeopardizing collaterals.  I reviewed her films with Dr. [name] who felt that she was a surgical candidate however she ultimately developed a subdural hematoma and follow-up with Dr. [name] after that suggesting medical therapy.  She denies claudication.

## 2021-04-07 NOTE — Assessment & Plan Note (Signed)
History of moderate bilateral ICA stenosis by duplex ultrasound recently performed 06/02/2020.  She does have bilateral carotid bruits.  We will repeat carotid Dopplers next month.

## 2021-04-07 NOTE — Assessment & Plan Note (Addendum)
History of CAD status post witnessed cardiac arrest with CPR, defibrillation and urgent bypass grafting in [facility] 09/27/2010.  She had a prolonged hospitalization.  Echo performed April 2012 showed normal LV systolic function and Myoview (02/12/2013) was normal as well.  She denies chest pain or shortness of breath.

## 2021-04-07 NOTE — Patient Instructions (Signed)
Medication Instructions:  No Changes In Medications at this time.  *If you need a refill on your cardiac medications before your next appointment, please call your pharmacy*  Follow-Up: At [facility], you and your health needs are our priority.  As part of our continuing mission to provide you with exceptional heart care, we have created designated Provider Care Teams.  These Care Teams include your primary Cardiologist (physician) and Advanced Practice Providers (APPs -  Physician Assistants and Nurse Practitioners) who all work together to provide you with the care you need, when you need it.   Your next appointment:   1 year(s)  The format for your next appointment:   In Person  Provider:   Quay Burow, MD

## 2021-04-08 ENCOUNTER — Encounter: Payer: Self-pay | Admitting: Psychiatry

## 2021-04-08 ENCOUNTER — Telehealth (INDEPENDENT_AMBULATORY_CARE_PROVIDER_SITE_OTHER): Payer: PPO | Admitting: Psychiatry

## 2021-04-08 VITALS — HR 104 | Wt 174.0 lb

## 2021-04-08 DIAGNOSIS — F325 Major depressive disorder, single episode, in full remission: Secondary | ICD-10-CM

## 2021-04-08 DIAGNOSIS — F410 Panic disorder [episodic paroxysmal anxiety] without agoraphobia: Secondary | ICD-10-CM

## 2021-04-08 MED ORDER — SERTRALINE HCL 100 MG PO TABS: 100.0000 mg | ORAL_TABLET | Freq: Two times a day (BID) | ORAL | 1 refills | Status: DC

## 2021-04-08 NOTE — Progress Notes (Signed)
Alicia Crawford [id number] 23-May-1956 65 y.o.  Virtual Visit via Telephone Note  I connected with pt on 04/08/21 at 12:00 PM EDT by telephone and verified that I am speaking with the correct person using two identifiers.   I discussed the limitations, risks, security and privacy concerns of performing an evaluation and management service by telephone and the availability of in person appointments. I also discussed with the patient that there may be a patient responsible charge related to this service. The patient expressed understanding and agreed to proceed.   I discussed the assessment and treatment plan with the patient. The patient was provided an opportunity to ask questions and all were answered. The patient agreed with the plan and demonstrated an understanding of the instructions.   The patient was advised to call back or seek an in-person evaluation if the symptoms worsen or if the condition fails to improve as anticipated.  I provided 30 minutes of non-face-to-face time during this encounter.  The patient was located at home.  The provider was located at [place].   Thayer Headings, PMHNP   Subjective:   Patient ID:  Alicia Crawford is a 65 y.o. (DOB Oct 16, 1955) female.  Chief Complaint:  Chief Complaint  Patient presents with   Anxiety    HPI Alicia Crawford presents for follow-up of anxiety and depression. She reports that she had a UTI that was caused by uterine prolapse and has apt to evaluate for possible surgery. She was hospitalized for elevated potassium. She reports that she had some mental status changes with UTI and elevated potassium.   She reports, "I am feeling much better." She reports that she is taking Alprazolam 1/2 tab BID. She had a panic attack while in the hospital. She reports anxiety has been improved since she returned home. She reports that anxiety has returned to baseline. She reports that she had some sadness yesterday and was  very tired. She reports that her mood has otherwise been ok. Sleeping well and throughout the night. Appetite has been decreased. She reports that she is doing things because she has to. She reports that concentration has been ok and is doing Bible Study and word puzzles. Denies SI.   Past Psychiatric Medication Trials: Xanax- Helpful Ativan Sertraline- started in 2013 Prozac- took for years and then stopped working Topamax  Review of Systems:  Review of Systems  Constitutional:  Positive for chills. Negative for fever.  Gastrointestinal:  Negative for diarrhea.  Genitourinary:  Positive for frequency.  Musculoskeletal:  Negative for gait problem.  Psychiatric/Behavioral:         Please refer to HPI   Medications: I have reviewed the patient's current medications.  Current Outpatient Medications  Medication Sig Dispense Refill   acetaminophen (TYLENOL) 650 MG CR tablet Take 650 mg by mouth every 6 (six) hours as needed for pain.     ALPRAZolam (XANAX) 0.5 MG tablet Take 1/2-1 tablet 3 times daily for the next 3-5 days, then take as needed for panic (Patient taking differently: Take 0.25-0.5 mg by mouth 2 (two) times daily as needed for anxiety or sleep.) 60 tablet 1   amLODipine (NORVASC) 5 MG tablet Take 1 tablet (5 mg total) by mouth daily. 30 tablet 0   Ascorbic Acid (VITAMIN C) 100 MG tablet Take 100 mg by mouth daily.     aspirin EC 81 MG tablet Take 81 mg by mouth daily.     atorvastatin (LIPITOR) 80 MG tablet TAKE 1 TABLET  BY MOUTH DAILY (Patient taking differently: Take 80 mg by mouth daily.) 90 tablet 3   chlorpheniramine (CHLOR-TRIMETON) 4 MG tablet Take 1 tablet (4 mg total) by mouth at bedtime as needed for allergies.     Cholecalciferol (VITAMIN D3) 1.25 MG (50000 UT) CAPS Take 1 capsule by mouth daily.     CHONDROITIN SULFATE PO Take 1,200 mg by mouth in the morning and at bedtime.     cilostazol (PLETAL) 100 MG tablet TAKE 1 TABLET BY MOUTH TWICE DAILY BEFORE MEALS  (Patient taking differently: Take 100 mg by mouth 2 (two) times daily before a meal.) 180 tablet 3   Coenzyme Q10 300 MG CAPS Take 300 mg by mouth daily.     Cyanocobalamin 1000 MCG/ML KIT Inject as directed.     estradiol (ESTRACE) 0.1 MG/GM vaginal cream Place 0.5 g vaginally 2 (two) times a week.     fluticasone (FLONASE) 50 MCG/ACT nasal spray Place 1 spray into both nostrils daily as needed for allergies.     loratadine (CLARITIN) 10 MG tablet Take 1 tablet (10 mg total) by mouth daily. 30 tablet 11   magnesium oxide (MAG-OX) 400 MG tablet Take 400 mg by mouth 2 (two) times daily.     Melatonin 10 MG TABS Take 5 mg by mouth at bedtime as needed (sleep).     metFORMIN (GLUCOPHAGE) 1000 MG tablet Take 1,000 mg by mouth 2 (two) times daily with a meal.  0   metoprolol tartrate (LOPRESSOR) 25 MG tablet TAKE 1 TABLET(25 MG) BY MOUTH TWICE DAILY (Patient taking differently: Take 25 mg by mouth 2 (two) times daily.) 180 tablet 3   pantoprazole (PROTONIX) 40 MG tablet Take 1 tablet (40 mg total) by mouth 2 (two) times daily. 90 tablet 3   PREBIOTIC PRODUCT PO Take 1 capsule by mouth daily.     Probiotic Product (PROBIOTIC-10 PO) Take 1 capsule by mouth daily.     QUERCETIN PO Take 1,000 mg by mouth in the morning and at bedtime.     REPATHA SURECLICK 650 MG/ML SOAJ ADMINISTER 1 ML UNDER THE SKIN EVERY 14 DAYS (Patient taking differently: Inject 140 mg into the skin every 14 (fourteen) days.) 2 mL 12   zinc gluconate 50 MG tablet Take 50 mg by mouth daily.     sertraline (ZOLOFT) 100 MG tablet Take 1 tablet (100 mg total) by mouth in the morning and at bedtime. 180 tablet 1   No current facility-administered medications for this visit.    Medication Side Effects: None  Allergies:  Allergies  Allergen Reactions   Halothane Hives and Other (See Comments)    Gas used 1980's Reaction: affected her liver    Past Medical History:  Diagnosis Date   Ankle fracture 09/05/2016   right ankle    Anoxic brain injury ([place]) 08/2010   "w/cardiac arrest"   Anxiety Jan. 2012   Blood transfusion 09/2010   Cardiac arrest ([place]) 08/04/2010   Carotid artery occlusion    mild to moderate bilateral internal carotid artery stenosis   Claudication ([place])    Complication of anesthesia    [place]   Coronary artery disease    CABG 2011 in [facility]   Coronary artery disease    Dementia    Depression    DVT (deep venous thrombosis) (HCC)    GERD (gastroesophageal reflux disease)    High cholesterol    Hypertension    Myocardial infarction ([place]) 09/27/10   in [place]  Obesity    Peripheral vascular disease (HCC)    bilat SFA diseae   Renal artery stenosis (HCC)    Rotator cuff tendonitis    Sleep apnea    uses CPAP nightly   Stroke [facility]) 2011   Subdural hematoma [facility]) May 25,2013   Frontal subdural  S/P evacuation of hematoma   Type II diabetes mellitus (HCC)    Diet and Exercise    Family History  Problem Relation Age of Onset   Heart disease Mother        before age 66   Diabetes Mother    Hyperlipidemia Mother    Hypertension Mother    Heart attack Mother    Heart disease Father        Before age 40   Hyperlipidemia Father    Hypertension Father    Heart attack Father    Heart disease Sister        Before age 70   Heart attack Sister    Hyperlipidemia Brother    Hypertension Brother    Heart attack Brother    Heart disease Brother        before age 42   Heart disease Other    Diabetes Other    Hypertension Other    Alcohol abuse Other    Mental illness Other     Social History   Socioeconomic History   Marital status: Married    Spouse name: Not on file   Number of children: Not on file   Years of education: Not on file   Highest education level: Not on file  Occupational History   Not on file  Tobacco Use   Smoking status: Former    Packs/day: 1.00    Years: 30.00    Pack years: 30.00    Types: Cigarettes    Quit date: 06/23/2003     Years since quitting: 17.8   Smokeless tobacco: Never  Vaping Use   Vaping Use: Not on file  Substance and Sexual Activity   Alcohol use: Not Currently    Alcohol/week: 0.0 standard drinks    Comment: 01/17/12 "used to have glass of wine now & then; last wine was ~ 2011"   Drug use: No   Sexual activity: Yes    Birth control/protection: None  Other Topics Concern   Not on file  Social History Narrative   Not on file   Social Determinants of Health   Financial Resource Strain: Not on file  Food Insecurity: Not on file  Transportation Needs: Not on file  Physical Activity: Not on file  Stress: Not on file  Social Connections: Not on file  Intimate Partner Violence: Not on file    Past Medical History, Surgical history, Social history, and Family history were reviewed and updated as appropriate.   Please see review of systems for further details on the patient's review from today.   Objective:   Physical Exam:  Pulse (!) 104   Wt 174 lb (78.9 kg)   BMI 30.82 kg/m   Physical Exam Neurological:     Mental Status: She is alert and oriented to person, place, and time.     Cranial Nerves: No dysarthria.  Psychiatric:        Attention and Perception: Attention and perception normal.        Mood and Affect: Mood normal.        Speech: Speech normal.        Behavior: Behavior is cooperative.  Thought Content: Thought content normal. Thought content is not paranoid or delusional. Thought content does not include homicidal or suicidal ideation. Thought content does not include homicidal or suicidal plan.        Cognition and Memory: Cognition and memory normal.        Judgment: Judgment normal.     Comments: Insight intact    Lab Review:     Component Value Date/Time   NA 139 03/22/2021 0512   K 3.9 03/22/2021 0512   CL 106 03/22/2021 0512   CO2 24 03/22/2021 0512   GLUCOSE 107 (H) 03/22/2021 0512   BUN 15 03/22/2021 0512   CREATININE 1.14 (H) 03/22/2021 0512    CALCIUM 8.8 (L) 03/22/2021 0512   PROT 6.7 03/21/2021 0051   ALBUMIN 3.2 (L) 03/21/2021 0051   AST 18 03/21/2021 0051   ALT 25 03/21/2021 0051   ALKPHOS 61 03/21/2021 0051   BILITOT 0.7 03/21/2021 0051   GFRNONAA 53 (L) 03/22/2021 0512   GFRAA 60 (L) 04/22/2019 1227       Component Value Date/Time   WBC 6.5 03/21/2021 0051   RBC 3.26 (L) 03/21/2021 0051   HGB 9.4 (L) 03/21/2021 0051   HCT 30.6 (L) 03/21/2021 0051   PLT 496 (H) 03/21/2021 0051   MCV 93.9 03/21/2021 0051   MCH 28.8 03/21/2021 0051   MCHC 30.7 03/21/2021 0051   RDW 14.6 03/21/2021 0051   LYMPHSABS 2.9 03/21/2021 0051   MONOABS 0.4 03/21/2021 0051   EOSABS 0.1 03/21/2021 0051   BASOSABS 0.1 03/21/2021 0051    No results found for: POCLITH, LITHIUM   No results found for: PHENYTOIN, PHENOBARB, VALPROATE, CBMZ   .res Assessment: Plan:   Will continue Sertraline 100 mg po BID for anxiety and depression.  Continue Xanax 0.5 mg 1/2-1 tablet twice daily as needed for anxiety. She reports that she is currently taking Xanax 0.5 mg 1/2 tablet twice daily and she reports that she would like to continue this until acute stressors and medical issues stabilize and then plans to reduce to 1/2 tablet at bedtime only. Agree with this plan. Discussed that she has a refill remaining on Xanax.  Agree with her plan to see PCP this afternoon regarding chills.  Pt to follow-up with this provider in 2 months or sooner if clinically indicated. Patient advised to contact office with any questions, adverse effects, or acute worsening in signs and symptoms.    Alicia Crawford was seen today for anxiety.  Diagnoses and all orders for this visit:  Panic disorder -     sertraline (ZOLOFT) 100 MG tablet; Take 1 tablet (100 mg total) by mouth in the morning and at bedtime.  Major depressive disorder in full remission, unspecified whether recurrent (HCC) -     sertraline (ZOLOFT) 100 MG tablet; Take 1 tablet (100 mg total) by mouth in the  morning and at bedtime.   Please see After Visit Summary for patient specific instructions.  Future Appointments  Date Time Provider [place]  04/19/2021  1:40 PM Jaquita Folds, MD [facility]  06/02/2021  3:00 PM [facility] NL VASC 3 [facility]  07/15/2021 12:15 PM Hayden Pedro, MD [facility]TRE None    No orders of the defined types were placed in this encounter.     -------------------------------

## 2021-04-13 ENCOUNTER — Telehealth: Payer: Self-pay | Admitting: Cardiovascular Disease

## 2021-04-13 MED ORDER — AMLODIPINE BESYLATE 5 MG PO TABS: 5.0000 mg | ORAL_TABLET | Freq: Every day | ORAL | 3 refills | Status: DC

## 2021-04-13 NOTE — Telephone Encounter (Signed)
*  STAT* If patient is at the pharmacy, call can be transferred to refill team.   1. Which medications need to be refilled? (please list name of each medication and dose if known)   2. Which pharmacy/location (including street and city if local pharmacy) is medication to be sent to? [place], [place] - [address] AT [address]  3. Do they need a 30 day or 90 day supply? 90 day supply

## 2021-04-19 ENCOUNTER — Ambulatory Visit (INDEPENDENT_AMBULATORY_CARE_PROVIDER_SITE_OTHER): Payer: PPO | Admitting: Obstetrics and Gynecology

## 2021-04-19 ENCOUNTER — Other Ambulatory Visit: Payer: Self-pay

## 2021-04-19 ENCOUNTER — Encounter: Payer: Self-pay | Admitting: Obstetrics and Gynecology

## 2021-04-19 VITALS — BP 118/80 | HR 83

## 2021-04-19 DIAGNOSIS — N813 Complete uterovaginal prolapse: Secondary | ICD-10-CM | POA: Diagnosis not present

## 2021-04-19 DIAGNOSIS — N8110 Cystocele, unspecified: Secondary | ICD-10-CM | POA: Diagnosis not present

## 2021-04-19 DIAGNOSIS — N952 Postmenopausal atrophic vaginitis: Secondary | ICD-10-CM

## 2021-04-19 DIAGNOSIS — N816 Rectocele: Secondary | ICD-10-CM

## 2021-04-19 NOTE — Progress Notes (Signed)
[facility]   Subjective:     Chief Complaint: No chief complaint on file.  History of Present Illness: Alicia Crawford is a 65 y.o. female with stage IV pelvic organ prolapse who presents for a pessary check. She is using a size 3in short stem gellhorn pessary. It was working well for a while, and was removing it every 5 days, but the pessary twisted and was uncomfortable and she removed it. She denies vaginal bleeding. Has been using vaginal estrogen with insertion of pessary.   Was recently hospitalized x2 for AKI, thought to be prerenal in origin and resolved with hydration. On first admission (03/07/21), was also noted to have klebsiella UTI and was treated with keflex. This was her first UTI in many years and she is concerned that it was caused by the pessary.   Past Medical History: Patient  has a past medical history of Ankle fracture (09/05/2016), Anoxic brain injury ([place]) (08/2010), Anxiety (Jan. 2012), Blood transfusion (09/2010), Cardiac arrest ([place]) (08/04/2010), Carotid artery occlusion, Claudication ([place]), Complication of anesthesia, Coronary artery disease, Coronary artery disease, Dementia, Depression, DVT (deep venous thrombosis) ([place]), GERD (gastroesophageal reflux disease), High cholesterol, Hypertension, Myocardial infarction ([place]) (09/27/10), Obesity, Peripheral vascular disease ([place]), Renal artery stenosis ([place]), Rotator cuff tendonitis, Sleep apnea, Stroke ([place]) (2011), Subdural hematoma [facility]) (May 25,2013), and Type II diabetes mellitus ([place]).   Past Surgical History: She  has a past surgical history that includes Ovarian cyst removal (1983); stomach and skin tuck (2000); Posterior fusion lumbar spine (` 2000); Cholecystectomy (~ 1980's); Coronary artery bypass graft (09/2010); Back surgery; Craniotomy (02/25/2012); Lower extremity angiogram (April 2013); Spine surgery; atherectomy (N/A, 01/17/2012); ORIF toe fracture (Left, 04/25/2019); and  Abdominoplasty/panniculectomy.   Medications: She has a current medication list which includes the following prescription(s): acetaminophen, alprazolam, amlodipine, amlodipine, vitamin c, aspirin ec, atorvastatin, chlorpheniramine, vitamin d3, chondroitin sulfate, cilostazol, coenzyme q10, cyanocobalamin, estradiol, fluticasone, loratadine, magnesium oxide, melatonin, metformin, metoprolol tartrate, pantoprazole, prebiotic product, probiotic product, quercetin, repatha sureclick, sertraline, and zinc gluconate.   Allergies: Patient is allergic to halothane.   Social History: Patient  reports that she quit smoking about 17 years ago. Her smoking use included cigarettes. She has a 30.00 pack-year smoking history. She has never used smokeless tobacco. She reports previous alcohol use. She reports that she does not use drugs.      Objective:    Physical Exam: BP 118/80   Pulse 83  Gen: No apparent distress, A&O x 3.     Assessment/Plan:    Assessment: Alicia Crawford is a 65 y.o. with stage IV pelvic organ prolapse .   Plan: - We discussed options of continuing pessary vs surgery. We discussed two options for prolapse repair:  1) vaginal repair without mesh - Pros - safer, no mesh complications - Cons - not as strong as mesh repair, higher risk of recurrence  2) laparoscopic repair with mesh - Pros - stronger, better long-term success - Cons - risks of mesh implant (erosion into vagina or bladder, adhering to the rectum, pain) - these risks are lower than with a vaginal mesh but still exist  - We reviewed that it is unlikely that the pessary caused the urinary tract infection, as pessary users do not have more urinary tract infections. - She elects to continue pessary use at this time and will replace it when she is at home.  - Handout was also provided about TVH, USLS if she decides to pursue surgery. Would need cardiac clearance if  she wanted to proceed.  - Continue vaginal estrogen  use twice a week for atrophy and UTI prevention.  Follow up 3 months or sooner if needed.  Jaquita Folds, MD   Time Spent: Time spent: I spent 20 minutes dedicated to the care of this patient on the date of this encounter to include pre-visit review of records, face-to-face time with the patient and post visit documentation.

## 2021-04-22 DIAGNOSIS — D649 Anemia, unspecified: Secondary | ICD-10-CM | POA: Diagnosis not present

## 2021-04-22 DIAGNOSIS — N3000 Acute cystitis without hematuria: Secondary | ICD-10-CM | POA: Diagnosis not present

## 2021-04-28 ENCOUNTER — Other Ambulatory Visit: Payer: Self-pay | Admitting: Cardiovascular Disease

## 2021-05-04 ENCOUNTER — Ambulatory Visit: Payer: PPO | Admitting: Obstetrics and Gynecology

## 2021-06-02 ENCOUNTER — Ambulatory Visit (HOSPITAL_COMMUNITY)
Admission: RE | Admit: 2021-06-02 | Discharge: 2021-06-02 | Disposition: A | Discharge status: Home / Self Care | Payer: PPO | Source: Ambulatory Visit | Attending: Internal Medicine | Admitting: Internal Medicine

## 2021-06-02 ENCOUNTER — Other Ambulatory Visit: Payer: Self-pay | Admitting: Cardiovascular Disease

## 2021-06-02 ENCOUNTER — Other Ambulatory Visit: Payer: Self-pay

## 2021-06-02 DIAGNOSIS — E782 Mixed hyperlipidemia: Secondary | ICD-10-CM

## 2021-06-02 DIAGNOSIS — I739 Peripheral vascular disease, unspecified: Secondary | ICD-10-CM

## 2021-06-02 DIAGNOSIS — I2510 Atherosclerotic heart disease of native coronary artery without angina pectoris: Secondary | ICD-10-CM

## 2021-06-02 DIAGNOSIS — I10 Essential (primary) hypertension: Secondary | ICD-10-CM | POA: Diagnosis not present

## 2021-06-02 DIAGNOSIS — I6523 Occlusion and stenosis of bilateral carotid arteries: Secondary | ICD-10-CM | POA: Diagnosis not present

## 2021-06-17 ENCOUNTER — Other Ambulatory Visit: Payer: Self-pay | Admitting: Emergency Medicine

## 2021-06-21 ENCOUNTER — Encounter: Payer: Self-pay | Admitting: *Deleted

## 2021-07-01 ENCOUNTER — Other Ambulatory Visit: Payer: Self-pay | Admitting: Family Medicine

## 2021-07-01 DIAGNOSIS — Z1231 Encounter for screening mammogram for malignant neoplasm of breast: Secondary | ICD-10-CM

## 2021-07-15 ENCOUNTER — Encounter (INDEPENDENT_AMBULATORY_CARE_PROVIDER_SITE_OTHER): Payer: PPO | Admitting: Ophthalmology

## 2021-07-21 ENCOUNTER — Other Ambulatory Visit: Payer: Self-pay

## 2021-07-21 ENCOUNTER — Encounter: Payer: Self-pay | Admitting: Obstetrics and Gynecology

## 2021-07-21 ENCOUNTER — Ambulatory Visit: Payer: PPO | Admitting: Obstetrics and Gynecology

## 2021-07-21 VITALS — BP 157/93 | HR 90 | Wt 174.0 lb

## 2021-07-21 DIAGNOSIS — N813 Complete uterovaginal prolapse: Secondary | ICD-10-CM

## 2021-07-21 DIAGNOSIS — N816 Rectocele: Secondary | ICD-10-CM | POA: Diagnosis not present

## 2021-07-21 DIAGNOSIS — N8110 Cystocele, unspecified: Secondary | ICD-10-CM

## 2021-07-21 NOTE — Progress Notes (Signed)
[facility]   Subjective:     Chief Complaint:  Chief Complaint  Patient presents with   Follow-up   History of Present Illness: Alicia Crawford is a 65 y.o. female with stage IV pelvic organ prolapse who presents for a pessary check. She is using a size 3in  short stem gellhorn pessary. She removes it once a week for two days. The pessary has been working well and is very comfortable while in place.  She is using vaginal estrogen twice per week. She denies vaginal bleeding.  Past Medical History: Patient  has a past medical history of Ankle fracture (09/05/2016), Anoxic brain injury ([place]) (08/2010), Anxiety (Jan. 2012), Blood transfusion (09/2010), Cardiac arrest ([place]) (08/04/2010), Carotid artery occlusion, Claudication ([place]), Complication of anesthesia, Coronary artery disease, Coronary artery disease, Dementia, Depression, DVT (deep venous thrombosis) ([place]), GERD (gastroesophageal reflux disease), High cholesterol, Hypertension, Myocardial infarction ([place]) (09/27/10), Obesity, Peripheral vascular disease ([place]), Renal artery stenosis ([place]), Rotator cuff tendonitis, Sleep apnea, Stroke ([place]) (2011), Subdural hematoma (May 25,2013), and Type II diabetes mellitus ([place]).   Past Surgical History: She  has a past surgical history that includes Ovarian cyst removal (1983); stomach and skin tuck (2000); Posterior fusion lumbar spine (` 2000); Cholecystectomy (~ 1980's); Coronary artery bypass graft (09/2010); Back surgery; Craniotomy (02/25/2012); Lower extremity angiogram (April 2013); Spine surgery; atherectomy (N/A, 01/17/2012); ORIF toe fracture (Left, 04/25/2019); and Abdominoplasty/panniculectomy.   Medications: She has a current medication list which includes the following prescription(s): acetaminophen, alprazolam, vitamin c, aspirin ec, atorvastatin, chlorpheniramine, vitamin d3, chondroitin sulfate, cilostazol, coenzyme q10, cyanocobalamin, estradiol, fluticasone, loratadine,  magnesium oxide, melatonin, metformin, metoprolol tartrate, pantoprazole, prebiotic product, probiotic product, quercetin, repatha sureclick, zinc gluconate, amlodipine, amlodipine, and sertraline.   Allergies: Patient is allergic to halothane.   Social History: Patient  reports that she quit smoking about 18 years ago. Her smoking use included cigarettes. She has a 30.00 pack-year smoking history. She has never used smokeless tobacco. She reports that she does not currently use alcohol. She reports that she does not use drugs.      Objective:    Physical Exam: BP (!) 157/93   Pulse 90   Wt 174 lb (78.9 kg)   BMI 30.82 kg/m  Gen: No apparent distress, A&O x 3. Detailed Urogynecologic Evaluation:  Pelvic Exam: Normal external female genitalia; Bartholin's and Skene's glands normal in appearance; urethral meatus normal in appearance, no urethral masses or discharge. The pessary was noted to be in place. It was removed and cleaned. Speculum exam revealed no lesions in the vagina. The pessary was replaced. It was comfortable to the patient and fit well.   POP-Q (01/22/21):  Complete prolapse noted   POP-Q   3                                            Aa   7                                           Ba   8  C    7                                            Gh   4                                            Pb   8                                            tvl    3                                            Ap   7                                            Bp   -2                                              D     Assessment/Plan:    Assessment: Ms. Rollison is a 65 y.o. with stage IV pelvic organ prolapse here for a pessary check. She is doing well.  Plan: She will keep pessary out overnight at least once a week.  She will continue to use estrogen. She will follow-up in 6 months for a pessary check or sooner as needed.  All  questions were answered.  Jaquita Folds, MD   Time Spent: I spent 15 minutes dedicated to the care of this patient on the date of this encounter to include pre-visit review of records, face-to-face time with the patient nd post visit documentation.

## 2021-07-29 ENCOUNTER — Other Ambulatory Visit: Payer: Self-pay

## 2021-07-29 ENCOUNTER — Ambulatory Visit
Admission: RE | Admit: 2021-07-29 | Discharge: 2021-07-29 | Disposition: A | Discharge status: Home / Self Care | Payer: PPO | Source: Ambulatory Visit | Attending: Family Medicine | Admitting: Family Medicine

## 2021-07-29 DIAGNOSIS — Z1231 Encounter for screening mammogram for malignant neoplasm of breast: Secondary | ICD-10-CM

## 2021-08-12 ENCOUNTER — Ambulatory Visit (INDEPENDENT_AMBULATORY_CARE_PROVIDER_SITE_OTHER): Payer: PPO

## 2021-08-12 ENCOUNTER — Encounter (HOSPITAL_COMMUNITY): Payer: Self-pay | Admitting: *Deleted

## 2021-08-12 ENCOUNTER — Ambulatory Visit (HOSPITAL_COMMUNITY)
Admission: EM | Admit: 2021-08-12 | Discharge: 2021-08-12 | Disposition: A | Discharge status: Home / Self Care | Payer: PPO | Attending: Internal Medicine | Admitting: Internal Medicine

## 2021-08-12 ENCOUNTER — Other Ambulatory Visit: Payer: Self-pay

## 2021-08-12 DIAGNOSIS — R600 Localized edema: Secondary | ICD-10-CM | POA: Diagnosis not present

## 2021-08-12 DIAGNOSIS — W19XXXA Unspecified fall, initial encounter: Secondary | ICD-10-CM

## 2021-08-12 DIAGNOSIS — M25531 Pain in right wrist: Secondary | ICD-10-CM | POA: Diagnosis not present

## 2021-08-12 DIAGNOSIS — S52571A Other intraarticular fracture of lower end of right radius, initial encounter for closed fracture: Secondary | ICD-10-CM

## 2021-08-12 DIAGNOSIS — S52611A Displaced fracture of right ulna styloid process, initial encounter for closed fracture: Secondary | ICD-10-CM

## 2021-08-12 DIAGNOSIS — M7989 Other specified soft tissue disorders: Secondary | ICD-10-CM | POA: Diagnosis not present

## 2021-08-12 MED ORDER — HYDROCODONE-ACETAMINOPHEN 5-325 MG PO TABS: 1.0000 | ORAL_TABLET | Freq: Two times a day (BID) | ORAL | 0 refills | Status: AC | PRN

## 2021-08-12 NOTE — Discharge Instructions (Addendum)
You have several fractures in your wrist.  Please stay in splint until you are seen by orthopedics.  As we discussed, you will need to call orthopedics as soon as possible to schedule an appointment.  Take hydrocodone up to twice a day as needed for pain.  This includes Tylenol so limit additional Tylenol to avoid going over 3000 mg/day.  This will also make you sleepy do not drive or drink alcohol taking it.  Please do not take it with Xanax as to combination can increase risk of overdose and complications.  If you have any worsening symptoms including numbness or tingling in your hands, increased pain, you need to go to the emergency room.

## 2021-08-12 NOTE — ED Notes (Signed)
Orth tech with PT

## 2021-08-12 NOTE — Progress Notes (Signed)
Orthopedic Tech Progress Note Patient Details:  Alicia Crawford August 17, 1956 [id number]  Ortho Devices Type of Ortho Device: Sugartong splint Ortho Device/Splint Location: RUE Ortho Device/Splint Interventions: Ordered, Application, Adjustment   Post Interventions Patient Tolerated: Well Instructions Provided: Care of device  [place] [facility] 08/12/2021, 11:06 AM

## 2021-08-12 NOTE — ED Triage Notes (Signed)
Pt fell while walking her dog yesterday. Pt now has pain swelling to RT wrist.

## 2021-08-12 NOTE — ED Provider Notes (Signed)
[facility]    CSN: [id number] Arrival date & time: 08/12/21  0900      History   Chief Complaint Chief Complaint  Patient presents with   Fall   Joint Swelling    RT wrist    HPI Alicia Crawford is a 65 y.o. female.   Patient presents today with a several hour history of pain in her right wrist.  Reports that yesterday she was walking her dog yesterday when the dog pulled on her causing her to fall forward onto an outstretched hand on her right wrist.  She has had ongoing pain since that time.  She has tried wrapping the area as well as over-the-counter analgesics without improvement of symptoms.  She is right-handed and having difficulty daily activities as result of symptoms.  Denies any previous injury or surgery involving wrist.  Pain is rated 6 on a 0-10 pain scale, localized to right wrist, described as aching periodic sharp pains, worse with certain movements, no alleviating factors identified.   Past Medical History:  Diagnosis Date   Ankle fracture 09/05/2016   right ankle   Anoxic brain injury [facility]) 08/2010   "w/cardiac arrest"   Anxiety Jan. 2012   Blood transfusion 09/2010   Cardiac arrest (HCC) 08/04/2010   Carotid artery occlusion    mild to moderate bilateral internal carotid artery stenosis   Claudication (HCC)    Complication of anesthesia    [place]   Coronary artery disease    CABG 2011 in [facility]   Coronary artery disease    Dementia    Depression    DVT (deep venous thrombosis) (HCC)    GERD (gastroesophageal reflux disease)    High cholesterol    Hypertension    Myocardial infarction (HCC) 09/27/10   in [email or web address]   Obesity    Peripheral vascular disease (HCC)    bilat SFA diseae   Renal artery stenosis (HCC)    Rotator cuff tendonitis    Sleep apnea    uses CPAP nightly   Stroke [facility]) 2011   Subdural hematoma May 25,2013   Frontal subdural  S/P evacuation of hematoma   Type II diabetes mellitus (HCC)    Diet and  Exercise    Patient Active Problem List   Diagnosis Date Noted   Hyperkalemia 03/20/2021   Coronary artery disease involving native heart without angina pectoris 03/20/2021   Acute renal failure superimposed on stage 2 chronic kidney disease (HCC) 03/06/2021   OSA (obstructive sleep apnea) 08/20/2020   Healthcare maintenance 08/20/2020   Chronic cough 05/28/2020   MDD (major depressive disorder) 10/15/2018   Peripheral vascular disease, unspecified (HCC) 03/14/2014   Aftercare following surgery of the circulatory system, NEC 03/14/2014   Occlusion and stenosis of carotid artery without mention of cerebral infarction 02/11/2013   SDH (subdural hematoma) 03/02/2012   S/P craniotomy, 02/25/12 after fall, SDH 02/27/2012   Bradycardia 02/27/2012   Obesity 02/27/2012   Diabetes mellitus 02/27/2012   CAD, cardiac arrest, CPR, CABG in [facility] Dec 2011-NL LVF by Echo, neg Nuc April 2013 02/27/2012   Anxiety, ? residual hypoxic brain inhury from cardiac arrest 2011 02/27/2012   Peripheral artery disease, bilta LE PVD by angio April 2013 01/18/2012   HLD (hyperlipidemia) 01/18/2012   Essential hypertension 01/18/2012   Renal artery stenosis, Right, 75% 01/18/2012    Past Surgical History:  Procedure Laterality Date   ABDOMINOPLASTY/PANNICULECTOMY     Had wound complication with MRSA after  ATHERECTOMY N/A 01/17/2012   Procedure: ATHERECTOMY;  Surgeon: Lorretta Harp, MD;  Location: [facility] CATH LAB;  Service: Cardiovascular;  Laterality: N/A;   BACK SURGERY     CHOLECYSTECTOMY  ~ 1980's   CORONARY ARTERY BYPASS GRAFT  09/2010   CABG X4   CRANIOTOMY  02/25/2012   Procedure: CRANIOTOMY HEMATOMA EVACUATION SUBDURAL;  Surgeon: Eustace Moore, MD;  Location: [place] NEURO ORS;  Service: Neurosurgery;  Laterality: Left;  Left craniotomy for evacuation of subdural hematoma   LOWER EXTREMITY ANGIOGRAM  April 2013   ORIF TOE FRACTURE Left 04/25/2019   Procedure: Open Reduction Internal Fixation (ORIF)  Left Fifth Metatarsal Ronnald Ramp Fracture;  Surgeon: Wylene Simmer, MD;  Location: [place];  Service: Orthopedics;  Laterality: Left;   OVARIAN CYST REMOVAL  1983   POSTERIOR FUSION LUMBAR SPINE  ` 2000   SPINE SURGERY     stomach and skin tuck  2000    OB History     Gravida  2   Para      Term      Preterm      AB      Living  2      SAB      IAB      Ectopic      Multiple      Live Births               Home Medications    Prior to Admission medications   Medication Sig Start Date End Date Taking? Authorizing Provider  HYDROcodone-acetaminophen (NORCO/VICODIN) 5-325 MG tablet Take 1 tablet by mouth 2 (two) times daily as needed for up to 3 days for severe pain. 08/12/21 08/15/21 Yes Rex Magee, Derry Skill, PA-C  acetaminophen (TYLENOL) 650 MG CR tablet Take 650 mg by mouth every 6 (six) hours as needed for pain.    [provider]  ALPRAZolam Duanne Moron) 0.5 MG tablet Take 1/2-1 tablet 3 times daily for the next 3-5 days, then take as needed for panic Patient taking differently: Take 0.25-0.5 mg by mouth 2 (two) times daily as needed for anxiety or sleep. 03/10/21   Thayer Headings, PMHNP  amLODipine (NORVASC) 5 MG tablet Take 1 tablet (5 mg total) by mouth daily. 03/23/21 04/22/21  Arrien, Jimmy Picket, MD  amLODipine (NORVASC) 5 MG tablet Take 1 tablet (5 mg total) by mouth daily. 04/13/21 07/12/21  Lorretta Harp, MD  Ascorbic Acid (VITAMIN C) 100 MG tablet Take 100 mg by mouth daily.    [provider]  aspirin EC 81 MG tablet Take 81 mg by mouth daily.    [provider]  atorvastatin (LIPITOR) 80 MG tablet TAKE 1 TABLET BY MOUTH DAILY Patient taking differently: Take 80 mg by mouth daily. 12/23/20   Lorretta Harp, MD  chlorpheniramine (CHLOR-TRIMETON) 4 MG tablet Take 1 tablet (4 mg total) by mouth at bedtime as needed for allergies. 03/22/21   Arrien, Jimmy Picket, MD  Cholecalciferol (VITAMIN D3) 1.25 MG (50000 UT) CAPS  Take 1 capsule by mouth daily.    [provider]  CHONDROITIN SULFATE PO Take 1,200 mg by mouth in the morning and at bedtime.    [provider]  cilostazol (PLETAL) 100 MG tablet TAKE 1 TABLET BY MOUTH TWICE DAILY BEFORE MEALS 04/28/21   Lorretta Harp, MD  Coenzyme Q10 300 MG CAPS Take 300 mg by mouth daily.    [provider]  Cyanocobalamin 1000 MCG/ML KIT Inject as directed.  [provider]  estradiol (ESTRACE) 0.1 MG/GM vaginal cream Place 0.5 g vaginally 2 (two) times a week. 03/22/21   Arrien, Jimmy Picket, MD  fluticasone (FLONASE) 50 MCG/ACT nasal spray Place 1 spray into both nostrils daily as needed for allergies. 03/22/21   Arrien, Jimmy Picket, MD  loratadine (CLARITIN) 10 MG tablet TAKE 1 TABLET(10 MG) BY MOUTH DAILY 06/22/21   Collene Gobble, MD  magnesium oxide (MAG-OX) 400 MG tablet Take 400 mg by mouth 2 (two) times daily.    [provider]  Melatonin 10 MG TABS Take 5 mg by mouth at bedtime as needed (sleep).    [provider]  metFORMIN (GLUCOPHAGE) 1000 MG tablet Take 1,000 mg by mouth 2 (two) times daily with a meal. 01/05/15   [provider]  metoprolol tartrate (LOPRESSOR) 25 MG tablet TAKE 1 TABLET(25 MG) BY MOUTH TWICE DAILY Patient taking differently: Take 25 mg by mouth 2 (two) times daily. 02/02/21   Lorretta Harp, MD  pantoprazole (PROTONIX) 40 MG tablet Take 1 tablet (40 mg total) by mouth 2 (two) times daily. 03/03/21   Collene Gobble, MD  PREBIOTIC PRODUCT PO Take 1 capsule by mouth daily.    [provider]  Probiotic Product (PROBIOTIC-10 PO) Take 1 capsule by mouth daily.    [provider]  QUERCETIN PO Take 1,000 mg by mouth in the morning and at bedtime.    [provider]  REPATHA SURECLICK 388 MG/ML SOAJ ADMINISTER 1 ML UNDER THE SKIN EVERY 14 DAYS Patient taking differently: Inject 140 mg into the skin every 14 (fourteen) days. 03/09/20   Lorretta Harp,  MD  sertraline (ZOLOFT) 100 MG tablet Take 1 tablet (100 mg total) by mouth in the morning and at bedtime. 04/08/21 07/07/21  Thayer Headings, PMHNP  zinc gluconate 50 MG tablet Take 50 mg by mouth daily.    [provider]    Family History Family History  Problem Relation Age of Onset   Heart disease Mother        before age 17   Diabetes Mother    Hyperlipidemia Mother    Hypertension Mother    Heart attack Mother    Heart disease Father        Before age 64   Hyperlipidemia Father    Hypertension Father    Heart attack Father    Heart disease Sister        Before age 59   Heart attack Sister    Hyperlipidemia Brother    Hypertension Brother    Heart attack Brother    Heart disease Brother        before age 97   Heart disease Other    Diabetes Other    Hypertension Other    Alcohol abuse Other    Mental illness Other     Social History Social History   Tobacco Use   Smoking status: Former    Packs/day: 1.00    Years: 30.00    Pack years: 30.00    Types: Cigarettes    Quit date: 06/23/2003    Years since quitting: 18.1   Smokeless tobacco: Never  Substance Use Topics   Alcohol use: Not Currently    Alcohol/week: 0.0 standard drinks    Comment: 01/17/12 "used to have glass of wine now & then; last wine was ~ 2011"   Drug use: No     Allergies   Haloth[place]   Review of Systems Review  of Systems  Constitutional:  Positive for activity change. Negative for appetite change, fatigue and fever.  Respiratory:  Negative for cough and shortness of breath.   Cardiovascular:  Negative for chest pain.  Gastrointestinal:  Negative for abdominal pain, diarrhea, nausea and vomiting.  Musculoskeletal:  Positive for arthralgias and joint swelling. Negative for myalgias.  Neurological:  Negative for dizziness, light-headedness and headaches.    Physical Exam Triage Vital Signs ED Triage Vitals  Enc Vitals Group     BP 08/12/21 0946 (!) 146/84     Pulse Rate  08/12/21 0946 86     Resp 08/12/21 0946 18     Temp 08/12/21 0946 98.3 F (36.8 C)     Temp src --      SpO2 08/12/21 0946 92 %     Weight --      Height --      Head Circumference --      Peak Flow --      Pain Score 08/12/21 0943 6     Pain Loc --      Pain Edu? --      Excl. in [place]? --    No data found.  Updated Vital Signs BP (!) 146/84   Pulse 86   Temp 98.3 F (36.8 C)   Resp 18   SpO2 92%   Visual Acuity Right Eye Distance:   Left Eye Distance:   Bilateral Distance:    Right Eye Near:   Left Eye Near:    Bilateral Near:     Physical Exam Vitals reviewed.  Constitutional:      General: She is awake. She is not in acute distress.    Appearance: Normal appearance. She is well-developed. She is not ill-appearing.     Comments: Very pleasant female appears stated age no acute distress  HENT:     Head: Normocephalic and atraumatic.  Cardiovascular:     Rate and Rhythm: Normal rate and regular rhythm.     Pulses:          Radial pulses are 2+ on the right side and 2+ on the left side.     Heart sounds: Normal heart sounds, S1 normal and S2 normal. No murmur heard.    Comments: Capillary refill within 2 seconds right fingers Pulmonary:     Effort: Pulmonary effort is normal.     Breath sounds: Normal breath sounds. No wheezing, rhonchi or rales.     Comments: Clear to auscultation bilaterally Musculoskeletal:     Right wrist: Swelling, deformity, tenderness and bony tenderness present. No snuff box tenderness. Decreased range of motion. Normal pulse.     Comments: Significant tenderness palpation or deformity noted over radial wrist.  Psychiatric:        Behavior: Behavior is cooperative.     UC Treatments / Results  Labs (all labs ordered are listed, but only abnormal results are displayed) Labs Reviewed - No data to display  EKG   Radiology DG Wrist Complete Right  Result Date: 08/12/2021 CLINICAL DATA:  Fall, right wrist pain and swelling EXAM:  RIGHT WRIST - COMPLETE 3+ VIEW COMPARISON:  None. FINDINGS: Impacted intra-articular fractures of the distal right radius without significant angulation. Minimally displaced fractures of the ulnar styloid. The carpus proper is normally aligned. Diffuse soft tissue edema about the wrist. IMPRESSION: 1. Impacted intra-articular fractures of the distal right radius without significant angulation. 2.  Minimally displaced fractures of the ulnar styloid. Electronically Signed   By: Cristie Hem  Cherly Beach M.D.   On: 08/12/2021 10:01    Procedures Procedures (including critical care time)  Medications Ordered in UC Medications - No data to display  Initial Impression / Assessment and Plan / UC Course  I have reviewed the triage vital signs and the nursing notes.  Pertinent labs & imaging results that were available during my care of the patient were reviewed by me and considered in my medical decision making (see chart for details).     X-ray obtained given [place] injury showed impacted intra-articular fracture of distal right radius and displaced fracture of ulnar styloid.  Patient was placed in [place] by [place].  She was given several doses of hydrocodone for pain management.  Discussed that she should not take hydrocodone with Xanax as it increases the risk of complications.  Discussed that she should not drive or drink alcohol with hydrocodone due to possible sedation.  She was also instructed to limit Tylenol to avoid going over daily maximum.  She was instructed to contact a orthopedic provider soon as possible to schedule an appointment; was given contact information for [facility] recommended she follow-up with them as soon as possible.  Discussed alarm symptoms that warrant emergent evaluation.  Strict return precautions given to which she expressed understanding.  Final Clinical Impressions(s) / UC Diagnoses   Final diagnoses:  Other closed intra-articular fracture of distal end of right radius,  initial encounter  Closed displaced fracture of styloid process of right ulna, initial encounter  Fall, initial encounter     Discharge Instructions      You have several fractures in your wrist.  Please stay in splint until you are seen by orthopedics.  As we discussed, you will need to call orthopedics as soon as possible to schedule an appointment.  Take hydrocodone up to twice a day as needed for pain.  This includes Tylenol so limit additional Tylenol to avoid going over 3000 mg/day.  This will also make you sleepy do not drive or drink alcohol taking it.  Please do not take it with Xanax as to combination can increase risk of overdose and complications.  If you have any worsening symptoms including numbness or tingling in your hands, increased pain, you need to go to the emergency room.     ED Prescriptions     Medication Sig Dispense Auth. Provider   HYDROcodone-acetaminophen (NORCO/VICODIN) 5-325 MG tablet Take 1 tablet by mouth 2 (two) times daily as needed for up to 3 days for severe pain. 6 tablet Asmi Fugere K, PA-C      I have reviewed the PDMP during this encounter.   Terrilee Croak, PA-C 08/12/21 1021

## 2021-08-12 NOTE — ED Notes (Signed)
Ortho tech called for splint.

## 2021-08-13 ENCOUNTER — Encounter (INDEPENDENT_AMBULATORY_CARE_PROVIDER_SITE_OTHER): Payer: PPO | Admitting: Ophthalmology

## 2021-08-13 DIAGNOSIS — G8918 Other acute postprocedural pain: Secondary | ICD-10-CM | POA: Diagnosis not present

## 2021-08-13 DIAGNOSIS — S52501A Unspecified fracture of the lower end of right radius, initial encounter for closed fracture: Secondary | ICD-10-CM | POA: Diagnosis not present

## 2021-08-13 DIAGNOSIS — S6291XA Unspecified fracture of right wrist and hand, initial encounter for closed fracture: Secondary | ICD-10-CM | POA: Diagnosis not present

## 2021-08-20 DIAGNOSIS — S6291XA Unspecified fracture of right wrist and hand, initial encounter for closed fracture: Secondary | ICD-10-CM | POA: Diagnosis not present

## 2021-08-20 DIAGNOSIS — S52501A Unspecified fracture of the lower end of right radius, initial encounter for closed fracture: Secondary | ICD-10-CM | POA: Diagnosis not present

## 2021-08-23 ENCOUNTER — Encounter (INDEPENDENT_AMBULATORY_CARE_PROVIDER_SITE_OTHER): Payer: PPO | Admitting: Ophthalmology

## 2021-08-26 ENCOUNTER — Other Ambulatory Visit: Payer: Self-pay | Admitting: Emergency Medicine

## 2021-09-02 ENCOUNTER — Telehealth: Payer: Self-pay | Admitting: *Deleted

## 2021-09-02 NOTE — Telephone Encounter (Signed)
Felicie left a voicemail she is trying to get ahold of someone for Dr. Sherlene Shams but no one answers. Would like someone to call her. Per chart review patient is seen in Urogynecology. Will route message to that office.  Temia Debroux,RN

## 2021-09-03 ENCOUNTER — Telehealth: Payer: Self-pay | Admitting: Psychiatry

## 2021-09-03 ENCOUNTER — Encounter: Payer: Self-pay | Admitting: Obstetrics and Gynecology

## 2021-09-03 ENCOUNTER — Ambulatory Visit (INDEPENDENT_AMBULATORY_CARE_PROVIDER_SITE_OTHER): Payer: PPO | Admitting: Obstetrics and Gynecology

## 2021-09-03 ENCOUNTER — Other Ambulatory Visit: Payer: Self-pay

## 2021-09-03 VITALS — BP 175/80 | HR 80 | Wt 172.0 lb

## 2021-09-03 DIAGNOSIS — N813 Complete uterovaginal prolapse: Secondary | ICD-10-CM | POA: Diagnosis not present

## 2021-09-03 DIAGNOSIS — F410 Panic disorder [episodic paroxysmal anxiety] without agoraphobia: Secondary | ICD-10-CM

## 2021-09-03 MED ORDER — ALPRAZOLAM 0.5 MG PO TABS: ORAL_TABLET | ORAL | 0 refills | Status: DC

## 2021-09-03 NOTE — Telephone Encounter (Signed)
Assuming she must be referring to Alprazolam. Recommend scheduling first available apt. Will send in Alprazolam to bridge until she can be seen.

## 2021-09-03 NOTE — Progress Notes (Signed)
[facility]   Subjective:     Chief Complaint:  Chief Complaint  Alicia Crawford presents with   Pessary Check   History of Present Illness: Alicia Crawford is a 65 y.o. female with stage IV pelvic organ prolapse who presents for a pessary check. She is using a size 3in  short stem gellhorn pessary. She removes it once a week. The pessary has been working well but she broke her wrist and is in a cast and has difficulty placing it back in. Her prolapse has been sticking out without the pessary.   Has been using vaseline with the pessary and vaseline noted to be stuck in the holes of the pessary.   Past Medical History: Alicia Crawford  has a past medical history of Ankle fracture (09/05/2016), Anoxic brain injury ([place]) (08/2010), Anxiety (Jan. 2012), Blood transfusion (09/2010), Cardiac arrest ([place]) (08/04/2010), Carotid artery occlusion, Claudication ([place]), Complication of anesthesia, Coronary artery disease, Coronary artery disease, Dementia, Depression, DVT (deep venous thrombosis) ([place]), GERD (gastroesophageal reflux disease), High cholesterol, Hypertension, Myocardial infarction ([place]) (09/27/10), Obesity, Peripheral vascular disease ([place]), Renal artery stenosis ([place]), Rotator cuff tendonitis, Sleep apnea, Stroke ([place]) (2011), Subdural hematoma (May 25,2013), and Type II diabetes mellitus ([place]).   Past Surgical History: She  has a past surgical history that includes Ovarian cyst removal (1983); stomach and skin tuck (2000); Posterior fusion lumbar spine (` 2000); Cholecystectomy (~ 1980's); Coronary artery bypass graft (09/2010); Back surgery; Craniotomy (02/25/2012); Lower extremity angiogram (April 2013); Spine surgery; atherectomy (N/A, 01/17/2012); ORIF toe fracture (Left, 04/25/2019); and Abdominoplasty/panniculectomy.   Medications: She has a current medication list which includes the following prescription(s): acetaminophen, alprazolam, vitamin c, aspirin ec, atorvastatin, chlorpheniramine,  vitamin d3, chondroitin sulfate, cilostazol, coenzyme q10, cyanocobalamin, estradiol, fluticasone, loratadine, magnesium oxide, melatonin, metformin, metoprolol tartrate, pantoprazole, prebiotic product, probiotic product, quercetin, repatha sureclick, zinc gluconate, and sertraline.   Allergies: Alicia Crawford is allergic to halothane.   Social History: Alicia Crawford  reports that she quit smoking about 18 years ago. Her smoking use included cigarettes. She has a 30.00 pack-year smoking history. She has never used smokeless tobacco. She reports that she does not currently use alcohol. She reports that she does not use drugs.      Objective:    Physical Exam: BP (!) 175/80   Pulse 80   Wt 172 lb (78 kg)   BMI 30.47 kg/m  Gen: No apparent distress, A&O x 3. Detailed Urogynecologic Evaluation:  Pelvic Exam: Normal external female genitalia; Bartholin's and Skene's glands normal in appearance; urethral meatus normal in appearance, no urethral masses or discharge. Speculum exam revealed no lesions in the vagina. A new pessary was replaced. It was comfortable to the Alicia Crawford and fit well.   POP-Q (01/22/21):  Complete prolapse noted   POP-Q   3                                            Aa   7                                           Ba   8  C    7                                            Gh   4                                            Pb   8                                            tvl    3                                            Ap   7                                            Bp   -2                                              D     Assessment/Plan:    Assessment: Alicia Crawford is a 65 y.o. with stage IV pelvic organ prolapse here for a pessary check.   Plan: - New 3in ss gellhorn pessary placed. - She will return for pessary cleanings once every 3 months.  - can use trimosan gel for vaginal moisture  All questions were  answered.  Jaquita Folds, MD   Time Spent: I spent 20 minutes dedicated to the care of this Alicia Crawford on the date of this encounter to include pre-visit review of records, face-to-face time with the Alicia Crawford and post visit documentation.

## 2021-09-03 NOTE — Telephone Encounter (Signed)
Will call Pt and advise. Apt 12/28

## 2021-09-03 NOTE — Telephone Encounter (Addendum)
Pt LM on VM needing a Rx for anxiety. Fell with her dog and broke wrist in 3 places. Had taken in the past for a while.   Apt 12/28   Contact # [place]

## 2021-09-08 ENCOUNTER — Other Ambulatory Visit: Payer: Self-pay | Admitting: Cardiovascular Disease

## 2021-09-24 DIAGNOSIS — S52501A Unspecified fracture of the lower end of right radius, initial encounter for closed fracture: Secondary | ICD-10-CM | POA: Diagnosis not present

## 2021-09-24 DIAGNOSIS — S6291XA Unspecified fracture of right wrist and hand, initial encounter for closed fracture: Secondary | ICD-10-CM | POA: Diagnosis not present

## 2021-09-29 ENCOUNTER — Encounter: Payer: Self-pay | Admitting: Psychiatry

## 2021-09-29 ENCOUNTER — Ambulatory Visit (INDEPENDENT_AMBULATORY_CARE_PROVIDER_SITE_OTHER): Payer: PPO | Admitting: Psychiatry

## 2021-09-29 DIAGNOSIS — F330 Major depressive disorder, recurrent, mild: Secondary | ICD-10-CM

## 2021-09-29 DIAGNOSIS — F410 Panic disorder [episodic paroxysmal anxiety] without agoraphobia: Secondary | ICD-10-CM | POA: Diagnosis not present

## 2021-09-29 MED ORDER — SERTRALINE HCL 100 MG PO TABS: 100.0000 mg | ORAL_TABLET | Freq: Two times a day (BID) | ORAL | 1 refills | Status: DC

## 2021-09-29 MED ORDER — ALPRAZOLAM 0.5 MG PO TABS: ORAL_TABLET | ORAL | 2 refills | Status: DC

## 2021-09-29 NOTE — Progress Notes (Signed)
Alicia Crawford [id number] 1955-10-26 65 y.o.  Virtual Visit via Telephone Note  I connected with pt on 09/29/21 at  9:30 AM EST by telephone and verified that I am speaking with the correct person using two identifiers.   I discussed the limitations, risks, security and privacy concerns of performing an evaluation and management service by telephone and the availability of in person appointments. I also discussed with the patient that there may be a patient responsible charge related to this service. The patient expressed understanding and agreed to proceed.   I discussed the assessment and treatment plan with the patient. The patient was provided an opportunity to ask questions and all were answered. The patient agreed with the plan and demonstrated an understanding of the instructions.   The patient was advised to call back or seek an in-person evaluation if the symptoms worsen or if the condition fails to improve as anticipated.  I provided 30 minutes of non-face-to-face time during this encounter.  The patient was located at home.  The provider was located at [place].   Thayer Headings, PMHNP   Subjective:   Patient ID:  Alicia Crawford is a 65 y.o. (DOB [birth year]) female.  Chief Complaint:  Chief Complaint  Patient presents with   Anxiety    HPI Alicia Crawford presents for follow-up of anxiety and depression. She reports that she has had increased stressors and anxiety. She lost power for 2 days. She reports that she had treatment for a dental infection and had to see dentist and has dental surgery scheduled. Has also been having issues with uterine prolapse. Also broke her wrist walking her dog and just got cast off. She reports "everything is getting better." She reports that she has been irritable in response to pain and stressors. She reports that she has needed Xanax prn BID to help with irritability. She has had increased worry. Husband gave her a trip to [place]  planned for February and she is anxious about flying. She denies any panic attacks.   She reports that her energy and motivation have been lower and attributes this to infection and possible mild depression. Denies current depression. Reports that her energy and motivation have improved. She reports that she has had trouble sleeping and has slept well in her bed for the last week. She has been drinking Ensure since it has been difficult to eat anything other than soft foods. She reports that she started having difficulty with concentration about 1.5 months ago after she broke her wrist and concentration has been improving and is now reading her Bible and working word puzzles.   She reports that she has had difficulty sleeping with cPap.   Denies SI.  Sister lost her husband 6 months ago.   Past Psychiatric Medication Trials: Xanax- Helpful Ativan Sertraline- started in 2013 Prozac- took for years and then stopped working Topamax  Review of Systems:  Review of Systems  HENT:  Positive for dental problem.   Musculoskeletal:  Negative for gait problem.  Neurological:  Negative for tremors.  Psychiatric/Behavioral:         Please refer to HPI   Medications: I have reviewed the patient's current medications.  Current Outpatient Medications  Medication Sig Dispense Refill   acetaminophen (TYLENOL) 650 MG CR tablet Take 650 mg by mouth every 6 (six) hours as needed for pain.     amLODipine (NORVASC) 5 MG tablet Take 5 mg by mouth daily.     aspirin EC  81 MG tablet Take 81 mg by mouth daily.     atorvastatin (LIPITOR) 80 MG tablet TAKE 1 TABLET BY MOUTH DAILY (Patient taking differently: Take 80 mg by mouth daily.) 90 tablet 3   chlorpheniramine (CHLOR-TRIMETON) 4 MG tablet Take 1 tablet (4 mg total) by mouth at bedtime as needed for allergies.     Cholecalciferol (VITAMIN D3) 1.25 MG (50000 UT) CAPS Take 1 capsule by mouth daily.     cilostazol (PLETAL) 100 MG tablet TAKE 1 TABLET BY  MOUTH TWICE DAILY BEFORE MEALS 180 tablet 3   estradiol (ESTRACE) 0.1 MG/GM vaginal cream Place 0.5 g vaginally 2 (two) times a week.     Evolocumab (REPATHA SURECLICK) 678 MG/ML SOAJ Inject 140 mg into the skin every 14 (fourteen) days. 2 mL 11   fluticasone (FLONASE) 50 MCG/ACT nasal spray Place 1 spray into both nostrils daily as needed for allergies.     loratadine (CLARITIN) 10 MG tablet TAKE 1 TABLET(10 MG) BY MOUTH DAILY 30 tablet 11   Melatonin 10 MG TABS Take 5 mg by mouth at bedtime as needed (sleep).     metFORMIN (GLUCOPHAGE) 1000 MG tablet Take 1,000 mg by mouth 2 (two) times daily with a meal.  0   metoprolol tartrate (LOPRESSOR) 25 MG tablet TAKE 1 TABLET(25 MG) BY MOUTH TWICE DAILY (Patient taking differently: Take 25 mg by mouth 2 (two) times daily.) 180 tablet 3   pantoprazole (PROTONIX) 40 MG tablet TAKE 1 TABLET(40 MG) BY MOUTH DAILY 90 tablet 3   ALPRAZolam (XANAX) 0.5 MG tablet Take 1/2-1 tablet twice daily as needed for severe anxiety 60 tablet 2   Ascorbic Acid (VITAMIN C) 100 MG tablet Take 100 mg by mouth daily. (Patient not taking: Reported on 09/29/2021)     Coenzyme Q10 300 MG CAPS Take 300 mg by mouth daily. (Patient not taking: Reported on 09/29/2021)     Cyanocobalamin 1000 MCG/ML KIT Inject as directed. (Patient not taking: Reported on 09/29/2021)     magnesium oxide (MAG-OX) 400 MG tablet Take 400 mg by mouth 2 (two) times daily. (Patient not taking: Reported on 09/29/2021)     PREBIOTIC PRODUCT PO Take 1 capsule by mouth daily. (Patient not taking: Reported on 09/29/2021)     Probiotic Product (PROBIOTIC-10 PO) Take 1 capsule by mouth daily. (Patient not taking: Reported on 09/29/2021)     QUERCETIN PO Take 1,000 mg by mouth in the morning and at bedtime. (Patient not taking: Reported on 09/29/2021)     sertraline (ZOLOFT) 100 MG tablet Take 1 tablet (100 mg total) by mouth in the morning and at bedtime. 180 tablet 1   zinc gluconate 50 MG tablet Take 50 mg by  mouth daily. (Patient not taking: Reported on 09/29/2021)     No current facility-administered medications for this visit.    Medication Side Effects: None  Allergies:  Allergies  Allergen Reactions   [place] Hives and Other (See Comments)    Gas used 1980's Reaction: affected her liver    Past Medical History:  Diagnosis Date   Ankle fracture 09/05/2016   right ankle   Anoxic brain injury [facility]) 08/2010   "w/cardiac arrest"   Anxiety Jan. 2012   Blood transfusion 09/2010   Cardiac arrest ([place]) 08/04/2010   Carotid artery occlusion    mild to moderate bilateral internal carotid artery stenosis   Claudication (HCC)    Complication of anesthesia    [place]   Coronary artery disease  CABG 2011 in [facility]   Coronary artery disease    Dementia    Depression    DVT (deep venous thrombosis) (HCC)    GERD (gastroesophageal reflux disease)    High cholesterol    Hypertension    Myocardial infarction [facility]) 09/27/10   in [place]   Obesity    Peripheral vascular disease (HCC)    bilat SFA diseae   Renal artery stenosis (HCC)    Rotator cuff tendonitis    Sleep apnea    uses CPAP nightly   Stroke [facility]) 2011   Subdural hematoma May 25,2013   Frontal subdural  S/P evacuation of hematoma   Type II diabetes mellitus (HCC)    Diet and Exercise    Family History  Problem Relation Age of Onset   Heart disease Mother        before age 59   Diabetes Mother    Hyperlipidemia Mother    Hypertension Mother    Heart attack Mother    Heart disease Father        Before age 63   Hyperlipidemia Father    Hypertension Father    Heart attack Father    Heart disease Sister        Before age 65   Heart attack Sister    Hyperlipidemia Brother    Hypertension Brother    Heart attack Brother    Heart disease Brother        before age 68   Heart disease Other    Diabetes Other    Hypertension Other    Alcohol abuse Other    Mental illness Other     Social  History   Socioeconomic History   Marital status: Married    Spouse name: Not on file   Number of children: Not on file   Years of education: Not on file   Highest education level: Not on file  Occupational History   Not on file  Tobacco Use   Smoking status: Former    Packs/day: 1.00    Years: 30.00    Pack years: 30.00    Types: Cigarettes    Quit date: 06/23/2003    Years since quitting: 18.2   Smokeless tobacco: Never  Vaping Use   Vaping Use: Not on file  Substance and Sexual Activity   Alcohol use: Not Currently    Alcohol/week: 0.0 standard drinks    Comment: 01/17/12 "used to have glass of wine now & then; last wine was ~ 2011"   Drug use: No   Sexual activity: Yes    Birth control/protection: None  Other Topics Concern   Not on file  Social History Narrative   Not on file   Social Determinants of Health   Financial Resource Strain: Not on file  Food Insecurity: Not on file  Transportation Needs: Not on file  Physical Activity: Not on file  Stress: Not on file  Social Connections: Not on file  Intimate Partner Violence: Not on file    Past Medical History, Surgical history, Social history, and Family history were reviewed and updated as appropriate.   Please see review of systems for further details on the patient's review from today.   Objective:   Physical Exam:  There were no vitals taken for this visit.  Physical Exam Neurological:     Mental Status: She is alert and oriented to person, place, and time.     Cranial Nerves: No dysarthria.  Psychiatric:  Attention and Perception: Attention and perception normal.        Mood and Affect: Mood is anxious.        Speech: Speech normal.        Behavior: Behavior is cooperative.        Thought Content: Thought content normal. Thought content is not paranoid or delusional. Thought content does not include homicidal or suicidal ideation. Thought content does not include homicidal or suicidal plan.         Cognition and Memory: Cognition and memory normal.        Judgment: Judgment normal.     Comments: Insight intact    Lab Review:     Component Value Date/Time   NA 139 03/22/2021 0512   K 3.9 03/22/2021 0512   CL 106 03/22/2021 0512   CO2 24 03/22/2021 0512   GLUCOSE 107 (H) 03/22/2021 0512   BUN 15 03/22/2021 0512   CREATININE 1.14 (H) 03/22/2021 0512   CALCIUM 8.8 (L) 03/22/2021 0512   PROT 6.7 03/21/2021 0051   ALBUMIN 3.2 (L) 03/21/2021 0051   AST 18 03/21/2021 0051   ALT 25 03/21/2021 0051   ALKPHOS 61 03/21/2021 0051   BILITOT 0.7 03/21/2021 0051   GFRNONAA 53 (L) 03/22/2021 0512   GFRAA 60 (L) 04/22/2019 1227       Component Value Date/Time   WBC 6.5 03/21/2021 0051   RBC 3.26 (L) 03/21/2021 0051   HGB 9.4 (L) 03/21/2021 0051   HCT 30.6 (L) 03/21/2021 0051   PLT 496 (H) 03/21/2021 0051   MCV 93.9 03/21/2021 0051   MCH 28.8 03/21/2021 0051   MCHC 30.7 03/21/2021 0051   RDW 14.6 03/21/2021 0051   LYMPHSABS 2.9 03/21/2021 0051   MONOABS 0.4 03/21/2021 0051   EOSABS 0.1 03/21/2021 0051   BASOSABS 0.1 03/21/2021 0051    No results found for: POCLITH, LITHIUM   No results found for: PHENYTOIN, PHENOBARB, VALPROATE, CBMZ   .res Assessment: Plan:   Pt seen for 30 minutes and time spent discussing recent exacerbation of anxiety s/s. She reports that that her anxiety and mood s/s have been steadily improving with resolution of underlying stressors and improvement in acute health issues. She reports that Xanax has also been helpful for her anxiety and irritability and would like to continue taking Xanax regularly until her dental surgery.  Discussed her anxiety regarding upcoming trip and advised that she could take Xanax 1 mg as needed for anxiety with flying.  Will continue Sertraline 100 mg po BID for anxiety and depression.  Pt to follow-up in 6-7 weeks or sooner if clinically indicated.  Patient advised to contact office with any questions, adverse effects,  or acute worsening in signs and symptoms.  Alicia Crawford was seen today for anxiety.  Diagnoses and all orders for this visit:  Panic disorder -     ALPRAZolam (XANAX) 0.5 MG tablet; Take 1/2-1 tablet twice daily as needed for severe anxiety -     sertraline (ZOLOFT) 100 MG tablet; Take 1 tablet (100 mg total) by mouth in the morning and at bedtime.  Mild episode of recurrent major depressive disorder (HCC) -     sertraline (ZOLOFT) 100 MG tablet; Take 1 tablet (100 mg total) by mouth in the morning and at bedtime.    Please see After Visit Summary for patient specific instructions.  Future Appointments  Date Time Provider [place]  01/25/2022 11:40 AM Jaquita Folds, MD [facility]    No orders  of the defined types were placed in this encounter.     -------------------------------

## 2021-10-05 ENCOUNTER — Other Ambulatory Visit: Payer: Self-pay | Admitting: Pulmonary Disease

## 2021-10-05 DIAGNOSIS — R053 Chronic cough: Secondary | ICD-10-CM

## 2021-10-12 ENCOUNTER — Encounter (INDEPENDENT_AMBULATORY_CARE_PROVIDER_SITE_OTHER): Payer: PPO | Admitting: Ophthalmology

## 2021-10-14 ENCOUNTER — Other Ambulatory Visit: Payer: Self-pay | Admitting: Cardiovascular Disease

## 2021-10-15 ENCOUNTER — Encounter (INDEPENDENT_AMBULATORY_CARE_PROVIDER_SITE_OTHER): Payer: PPO | Admitting: Ophthalmology

## 2021-10-20 DIAGNOSIS — E11319 Type 2 diabetes mellitus with unspecified diabetic retinopathy without macular edema: Secondary | ICD-10-CM | POA: Diagnosis not present

## 2021-10-20 DIAGNOSIS — F329 Major depressive disorder, single episode, unspecified: Secondary | ICD-10-CM | POA: Diagnosis not present

## 2021-10-20 DIAGNOSIS — I10 Essential (primary) hypertension: Secondary | ICD-10-CM | POA: Diagnosis not present

## 2021-10-20 DIAGNOSIS — E1169 Type 2 diabetes mellitus with other specified complication: Secondary | ICD-10-CM | POA: Diagnosis not present

## 2021-10-20 DIAGNOSIS — E785 Hyperlipidemia, unspecified: Secondary | ICD-10-CM | POA: Diagnosis not present

## 2021-10-26 ENCOUNTER — Encounter (INDEPENDENT_AMBULATORY_CARE_PROVIDER_SITE_OTHER): Payer: PPO | Admitting: Ophthalmology

## 2021-10-26 ENCOUNTER — Other Ambulatory Visit: Payer: Self-pay

## 2021-10-26 DIAGNOSIS — H43813 Vitreous degeneration, bilateral: Secondary | ICD-10-CM

## 2021-10-26 DIAGNOSIS — H33301 Unspecified retinal break, right eye: Secondary | ICD-10-CM

## 2021-10-26 DIAGNOSIS — E113312 Type 2 diabetes mellitus with moderate nonproliferative diabetic retinopathy with macular edema, left eye: Secondary | ICD-10-CM

## 2021-10-26 DIAGNOSIS — E113291 Type 2 diabetes mellitus with mild nonproliferative diabetic retinopathy without macular edema, right eye: Secondary | ICD-10-CM

## 2021-10-26 DIAGNOSIS — I10 Essential (primary) hypertension: Secondary | ICD-10-CM | POA: Diagnosis not present

## 2021-10-26 DIAGNOSIS — H35033 Hypertensive retinopathy, bilateral: Secondary | ICD-10-CM

## 2021-11-05 DIAGNOSIS — S6291XA Unspecified fracture of right wrist and hand, initial encounter for closed fracture: Secondary | ICD-10-CM | POA: Diagnosis not present

## 2021-11-05 DIAGNOSIS — S52501A Unspecified fracture of the lower end of right radius, initial encounter for closed fracture: Secondary | ICD-10-CM | POA: Diagnosis not present

## 2021-12-08 ENCOUNTER — Telehealth: Payer: Self-pay | Admitting: Psychiatry

## 2021-12-08 NOTE — Telephone Encounter (Signed)
Please advise 

## 2021-12-08 NOTE — Telephone Encounter (Signed)
Recommend follow-up 6 months from last apt.  ?

## 2021-12-08 NOTE — Telephone Encounter (Signed)
Pt LVM at 1:03p stating she is doing really well and doesn't think she needs to schedule an appt to return.  (Visit in Dec said she was to return in 6 wks).  She said she is only taking 1/2 of Alprazolam every morning.  Pls let me know if you want [place] to call her and still schedule an appt in the future. ? ? ?

## 2021-12-09 NOTE — Telephone Encounter (Signed)
Appt scheduled 6/5 ?

## 2021-12-29 ENCOUNTER — Emergency Department (HOSPITAL_COMMUNITY)
Admission: EM | Admit: 2021-12-29 | Discharge: 2021-12-29 | Disposition: A | Discharge status: Home / Self Care | Payer: PPO | Source: Home / Self Care | Attending: Emergency Medicine | Admitting: Emergency Medicine

## 2021-12-29 ENCOUNTER — Emergency Department (HOSPITAL_COMMUNITY): Payer: PPO

## 2021-12-29 DIAGNOSIS — E7800 Pure hypercholesterolemia, unspecified: Secondary | ICD-10-CM | POA: Diagnosis present

## 2021-12-29 DIAGNOSIS — S4992XA Unspecified injury of left shoulder and upper arm, initial encounter: Secondary | ICD-10-CM | POA: Diagnosis present

## 2021-12-29 DIAGNOSIS — Z96612 Presence of left artificial shoulder joint: Secondary | ICD-10-CM | POA: Diagnosis not present

## 2021-12-29 DIAGNOSIS — G4733 Obstructive sleep apnea (adult) (pediatric): Secondary | ICD-10-CM | POA: Diagnosis present

## 2021-12-29 DIAGNOSIS — E119 Type 2 diabetes mellitus without complications: Secondary | ICD-10-CM | POA: Insufficient documentation

## 2021-12-29 DIAGNOSIS — I252 Old myocardial infarction: Secondary | ICD-10-CM | POA: Diagnosis not present

## 2021-12-29 DIAGNOSIS — S42252A Displaced fracture of greater tuberosity of left humerus, initial encounter for closed fracture: Secondary | ICD-10-CM | POA: Diagnosis not present

## 2021-12-29 DIAGNOSIS — I2510 Atherosclerotic heart disease of native coronary artery without angina pectoris: Secondary | ICD-10-CM | POA: Insufficient documentation

## 2021-12-29 DIAGNOSIS — Z951 Presence of aortocoronary bypass graft: Secondary | ICD-10-CM | POA: Diagnosis not present

## 2021-12-29 DIAGNOSIS — S42202A Unspecified fracture of upper end of left humerus, initial encounter for closed fracture: Secondary | ICD-10-CM | POA: Diagnosis present

## 2021-12-29 DIAGNOSIS — S42292A Other displaced fracture of upper end of left humerus, initial encounter for closed fracture: Secondary | ICD-10-CM | POA: Diagnosis not present

## 2021-12-29 DIAGNOSIS — N189 Chronic kidney disease, unspecified: Secondary | ICD-10-CM | POA: Diagnosis present

## 2021-12-29 DIAGNOSIS — S4292XD Fracture of left shoulder girdle, part unspecified, subsequent encounter for fracture with routine healing: Secondary | ICD-10-CM | POA: Diagnosis not present

## 2021-12-29 DIAGNOSIS — Z87891 Personal history of nicotine dependence: Secondary | ICD-10-CM | POA: Diagnosis not present

## 2021-12-29 DIAGNOSIS — W1839XA Other fall on same level, initial encounter: Secondary | ICD-10-CM | POA: Insufficient documentation

## 2021-12-29 DIAGNOSIS — W19XXXA Unspecified fall, initial encounter: Secondary | ICD-10-CM

## 2021-12-29 DIAGNOSIS — E669 Obesity, unspecified: Secondary | ICD-10-CM | POA: Diagnosis present

## 2021-12-29 DIAGNOSIS — Z7984 Long term (current) use of oral hypoglycemic drugs: Secondary | ICD-10-CM | POA: Diagnosis not present

## 2021-12-29 DIAGNOSIS — I10 Essential (primary) hypertension: Secondary | ICD-10-CM | POA: Insufficient documentation

## 2021-12-29 DIAGNOSIS — Z471 Aftercare following joint replacement surgery: Secondary | ICD-10-CM | POA: Diagnosis not present

## 2021-12-29 DIAGNOSIS — Z8673 Personal history of transient ischemic attack (TIA), and cerebral infarction without residual deficits: Secondary | ICD-10-CM | POA: Diagnosis not present

## 2021-12-29 DIAGNOSIS — S42232A 3-part fracture of surgical neck of left humerus, initial encounter for closed fracture: Secondary | ICD-10-CM | POA: Diagnosis not present

## 2021-12-29 DIAGNOSIS — Z96642 Presence of left artificial hip joint: Secondary | ICD-10-CM | POA: Diagnosis not present

## 2021-12-29 DIAGNOSIS — Z8674 Personal history of sudden cardiac arrest: Secondary | ICD-10-CM | POA: Diagnosis not present

## 2021-12-29 DIAGNOSIS — Z79899 Other long term (current) drug therapy: Secondary | ICD-10-CM | POA: Diagnosis not present

## 2021-12-29 DIAGNOSIS — K219 Gastro-esophageal reflux disease without esophagitis: Secondary | ICD-10-CM | POA: Diagnosis present

## 2021-12-29 DIAGNOSIS — F0390 Unspecified dementia without behavioral disturbance: Secondary | ICD-10-CM | POA: Diagnosis present

## 2021-12-29 DIAGNOSIS — G8918 Other acute postprocedural pain: Secondary | ICD-10-CM | POA: Diagnosis not present

## 2021-12-29 DIAGNOSIS — Z7982 Long term (current) use of aspirin: Secondary | ICD-10-CM | POA: Diagnosis not present

## 2021-12-29 DIAGNOSIS — I6529 Occlusion and stenosis of unspecified carotid artery: Secondary | ICD-10-CM | POA: Diagnosis present

## 2021-12-29 DIAGNOSIS — F419 Anxiety disorder, unspecified: Secondary | ICD-10-CM | POA: Diagnosis present

## 2021-12-29 DIAGNOSIS — Z6829 Body mass index (BMI) 29.0-29.9, adult: Secondary | ICD-10-CM | POA: Diagnosis not present

## 2021-12-29 DIAGNOSIS — W541XXA Struck by dog, initial encounter: Secondary | ICD-10-CM | POA: Diagnosis not present

## 2021-12-29 DIAGNOSIS — W19XXXD Unspecified fall, subsequent encounter: Secondary | ICD-10-CM | POA: Diagnosis not present

## 2021-12-29 DIAGNOSIS — E1122 Type 2 diabetes mellitus with diabetic chronic kidney disease: Secondary | ICD-10-CM | POA: Diagnosis present

## 2021-12-29 DIAGNOSIS — M7989 Other specified soft tissue disorders: Secondary | ICD-10-CM | POA: Diagnosis not present

## 2021-12-29 DIAGNOSIS — Y92019 Unspecified place in single-family (private) house as the place of occurrence of the external cause: Secondary | ICD-10-CM | POA: Diagnosis not present

## 2021-12-29 DIAGNOSIS — M25512 Pain in left shoulder: Secondary | ICD-10-CM | POA: Diagnosis not present

## 2021-12-29 DIAGNOSIS — E1151 Type 2 diabetes mellitus with diabetic peripheral angiopathy without gangrene: Secondary | ICD-10-CM | POA: Diagnosis present

## 2021-12-29 DIAGNOSIS — Z888 Allergy status to other drugs, medicaments and biological substances status: Secondary | ICD-10-CM | POA: Diagnosis not present

## 2021-12-29 MED ORDER — SENNOSIDES-DOCUSATE SODIUM 8.6-50 MG PO TABS: 1.0000 | ORAL_TABLET | Freq: Every evening | ORAL | 0 refills | Status: DC | PRN

## 2021-12-29 MED ORDER — OXYCODONE-ACETAMINOPHEN 5-325 MG PO TABS: 1.0000 | ORAL_TABLET | Freq: Four times a day (QID) | ORAL | 0 refills | Status: DC | PRN

## 2021-12-29 MED ORDER — OXYCODONE-ACETAMINOPHEN 5-325 MG PO TABS
1.0000 | ORAL_TABLET | Freq: Once | ORAL | Status: AC
  Administered 2021-12-29: 1 via ORAL
  Filled 2021-12-29: qty 1

## 2021-12-29 NOTE — ED Provider Triage Note (Signed)
Emergency Medicine Provider Triage Evaluation Note ? ?Wyline Mood , a 66 y.o. female  was evaluated in triage.  Pt complains of left shoulder pain. She states that today her dog ran up behind her and knocked her down. She landed on her left arm. Her pain is mostly located in the left upper arm. ? ?Review of Systems  ?Positive:  ?Negative:  ? ?Physical Exam  ?BP (!) 163/81 (BP Location: Right Arm)   Pulse 84   Temp 97.7 ?F (36.5 ?C)   Resp 20   SpO2 96%  ?Gen:   Awake, no distress   ?Resp:  Normal effort  ?MSK:   Moves extremities without difficulty  ?Other:  Pain at the left humeral head. ROM difficulty of shoulder. Elbow without tenderness, full ROM. Radial pulse intact. Sensation intact.  ? ?Medical Decision Making  ?Medically screening exam initiated at 8:22 PM.  Appropriate orders placed.  SUNDAI PROBERT was informed that the remainder of the evaluation will be completed by another provider, this initial triage assessment does not replace that evaluation, and the importance of remaining in the ED until their evaluation is complete. ? ? ?  ?Adolphus Birchwood, PA-C ?12/29/21 2023 ? ?

## 2021-12-29 NOTE — ED Triage Notes (Incomplete)
Pt w mechanical fall after tripped by dog, fell on L arm, pain in L shoulder/elbow. CNS intact distal to injury ?

## 2021-12-29 NOTE — ED Provider Notes (Signed)
? ?Emergency Department Provider Note ? ? ?I have reviewed the triage vital signs and the nursing notes. ? ? ?HISTORY ? ?Chief Complaint ?Fall and Shoulder Pain (L) ? ? ?HPI ?Alicia Crawford is a 66 y.o. female with past medical history reviewed below presents emergency department for evaluation of left shoulder pain after a fall.  She reports having mechanical fall landing on her left shoulder.  Denies head injury or loss of consciousness.  No presyncopal symptoms prior to fall. No numbness/weakness. ? ? ?Past Medical History:  ?Diagnosis Date  ? Ankle fracture 09/05/2016  ? right ankle  ? Anoxic brain injury ([place]) 08/2010  ? "w/cardiac arrest"  ? Anxiety Jan. 2012  ? Blood transfusion 09/2010  ? Cardiac arrest ([place]) 08/04/2010  ? Carotid artery occlusion   ? mild to moderate bilateral internal carotid artery stenosis  ? [facility])   ? Complication of anesthesia   ? [place]  ? Coronary artery disease   ? CABG 2011 in [place]  ? Coronary artery disease   ? Dementia   ? Depression   ? DVT (deep venous thrombosis) ([place])   ? GERD (gastroesophageal reflux disease)   ? High cholesterol   ? Hypertension   ? Myocardial infarction ([place]) 09/27/10  ? in [place]  ? Obesity   ? Peripheral vascular disease ([place])   ? bilat SFA diseae  ? Renal artery stenosis ([place])   ? Rotator cuff tendonitis   ? Sleep apnea   ? uses CPAP nightly  ? Stroke [facility]) 2011  ? Subdural hematoma May 25,2013  ? Frontal subdural  S/P evacuation of hematoma  ? Type II diabetes mellitus ([place])   ? Diet and Exercise  ? ? ?Review of Systems ? ?Constitutional: No fever/chills ?Cardiovascular: Denies chest pain. ?Respiratory: Denies shortness of breath. ?Gastrointestinal: No abdominal pain.  ?Musculoskeletal: Positive left shoulder pain.  ?Skin: Negative for rash. ?Neurological: Negative for headaches. No numbness.  ? ?____________________________________________ ? ? ?PHYSICAL EXAM: ? ?VITAL SIGNS: ?ED Triage Vitals  ?Enc Vitals Group  ?   BP  12/29/21 2002 (!) 163/81  ?   Pulse Rate 12/29/21 2002 84  ?   Resp 12/29/21 2002 20  ?   Temp 12/29/21 2002 97.7 ?F (36.5 ?C)  ?   Temp src --   ?   SpO2 12/29/21 2002 96 %  ? ?Constitutional: Alert and oriented. Well appearing and in no acute distress. ?Eyes: Conjunctivae are normal.  ?Head: Atraumatic. ?Nose: No congestion/rhinnorhea. ?Mouth/Throat: Mucous membranes are moist.  ?Neck: No stridor.  ?Cardiovascular:Good peripheral circulation.  ?Respiratory: Normal respiratory effort.  ?Gastrointestinal: No distention.  ?Musculoskeletal: Tenderness to the left shoulder.  ?Neurologic:  Normal speech and language. No gross focal neurologic deficits are appreciated.  ?Skin:  Skin is warm, dry and intact. No rash noted. ? ?____________________________________________ ? ?RADIOLOGY ? ?DG Shoulder Left ? ?Result Date: 12/29/2021 ?CLINICAL DATA:  Status post trauma. EXAM: LEFT SHOULDER - 2+ VIEW COMPARISON:  None. FINDINGS: An acute, impacted fracture deformity is seen involving the head and neck of the proximal left humerus. There is no evidence of dislocation. Multiple sternal wires and vascular clips are seen. Soft tissue swelling is noted along the lateral aspect of the left shoulder. IMPRESSION: Acute fracture of the proximal left humerus. Electronically Signed   By: Virgina Norfolk M.D.   On: 12/29/2021 21:01   ? ?____________________________________________ ? ? ?PROCEDURES ? ?Procedure(s) performed:  ? ?Procedures ? ?None  ?____________________________________________ ? ? ?INITIAL IMPRESSION /  ASSESSMENT AND PLAN / ED COURSE ? ?Pertinent labs & imaging results that were available during my care of the patient were reviewed by me and considered in my medical decision making (see chart for details). ?  ?This patient is Presenting for Evaluation of shoulder pain, which does require a range of treatment options, and is a complaint that involves a high risk of morbidity and mortality. ? ?The Differential Diagnoses  include fracture, dislocation, septic joint, arthritis, sprain. ? ?Critical Interventions-  ?  ?Medications  ?oxyCODONE-acetaminophen (PERCOCET/ROXICET) 5-325 MG per tablet 1 tablet (1 tablet Oral Given 12/29/21 2024)  ? ? ?Reassessment after intervention: pain improved.  ? ? ?Radiologic Tests Ordered, included shoulder x-ray. I independently interpreted the images and agree with radiology interpretation.  ? ?Medical Decision Making: Summary:  ?Patient presents emergency department evaluation of left shoulder pain after a fall.  X-ray shows proximal humerus fracture.  Patient placed in a sling and given pain medication.  Symptoms improved.  Discussed need for close orthopedics follow-up.  ? ?Disposition: discharge ? ?____________________________________________ ? ?FINAL CLINICAL IMPRESSION(S) / ED DIAGNOSES ? ?Final diagnoses:  ?Closed fracture of proximal end of left humerus, unspecified fracture morphology, initial encounter  ?Fall, initial encounter  ? ? ? ?NEW OUTPATIENT MEDICATIONS STARTED DURING THIS VISIT: ? ?Discharge Medication List as of 12/29/2021 10:04 PM  ?  ? ?START taking these medications  ? Details  ?senna-docusate (SENOKOT-S) 8.6-50 MG tablet Take 1 tablet by mouth at bedtime as needed for mild constipation or moderate constipation., Starting Wed 12/29/2021, Normal  ?  ?oxyCODONE-acetaminophen (PERCOCET/ROXICET) 5-325 MG tablet Take 1 tablet by mouth every 6 (six) hours as needed for severe pain., Starting Wed 12/29/2021, Normal  ?  ?  ? ? ?Note:  This document was prepared using Dragon voice recognition software and may include unintentional dictation errors. ? ?Nanda Quinton, MD, FACEP ?Emergency Medicine ? ?  ?Margette Fast, MD ?12/31/21 2328 ? ?

## 2021-12-29 NOTE — Discharge Instructions (Addendum)
You were seen in the emergency room today after a fall.  You have a fracture of the arm bone near your shoulder.  We are placing in a sling for comfort.  Please call the orthopedic doctor listed or your orthopedist for follow-up in the coming week. ? ?You cannot take the pain medicine Percocet with other pain or anxiety medications.  This may cause drowsiness as well as constipation.  You cannot drive a car while taking this medicine. ?

## 2021-12-29 NOTE — ED Notes (Signed)
All discharge instructions including follow up care and prescriptions reviewed with patient and patient verbalized understanding of same. Patient had safe ride home from husband at bedside. Patient stable and ambulatory at time of discharge.  ?

## 2021-12-30 DIAGNOSIS — M25512 Pain in left shoulder: Secondary | ICD-10-CM | POA: Diagnosis not present

## 2021-12-30 DIAGNOSIS — S42252A Displaced fracture of greater tuberosity of left humerus, initial encounter for closed fracture: Secondary | ICD-10-CM | POA: Diagnosis not present

## 2021-12-30 DIAGNOSIS — S42232A 3-part fracture of surgical neck of left humerus, initial encounter for closed fracture: Secondary | ICD-10-CM | POA: Diagnosis not present

## 2021-12-30 NOTE — Progress Notes (Signed)
COVID swab appointment:  N/A ? ?COVID Vaccine Completed: ?Date COVID Vaccine completed: ?Has received booster: ?COVID vaccine manufacturer: [place]  ? ?Date of COVID positive in last 90 days: ? ?PCP - Eunice Blase, MD ?Cardiologist - Quay Burow, MD ? ?Chest x-ray - 03-09-21 epic ?EKG - 04-07-21 Epic ?Stress Test - greater than 2 years Epic ?ECHO - greater than 2 years ?Cardiac Cath -  ?Pacemaker/ICD device last checked: ?Spinal Cord Stimulator: ? ?Bowel Prep - N/A ? ?Sleep Study - Yes, +sleep apnea ?CPAP - Yes ? ?Fasting Blood Sugar -  ?Checks Blood Sugar _____ times a day ? ?Blood Thinner Instructions: Pletal ?Aspirin Instructions:  ASA 81 mg  ?Last Dose: ? ?Activity level:  Can go up a flight of stairs and perform activities of daily living without stopping and without symptoms of chest pain or shortness of breath. ?  Able to exercise without symptoms ? ?Unable to go up a flight of stairs without symptoms of  ?   ? ?Anesthesia review:  CAD, Hx of cardiac arrest and CABG.  HTN, stroke, OSA ? ?Patient denies shortness of breath, fever, cough and chest pain at PAT appointment ? ? ?Patient verbalized understanding of instructions that were given to them at the PAT appointment. Patient was also instructed that they will need to review over the PAT instructions again at home before surgery.  ?

## 2021-12-30 NOTE — Progress Notes (Signed)
Pt aware to arrive at [facility] admitting at 12:45 pm for scheduled surgical procedure. Pt aware no food after midnight; clear liquids from midnight till 12:30 pm day of surgery then nothing by mouth. Reviewed day of surgery medication to take with sip of water Tylenol, Alprazolam, Amlodipine, Pantoprazole, Atorvastatin, Claritin, Metoprolol, Zoloft. Do not take Metformin morning of surgery. Pt verbalized understanding

## 2021-12-30 NOTE — H&P (Signed)
?Patient's anticipated LOS is less than 2 midnights, meeting these requirements: ?- Younger than 76 ?- Lives within 1 hour of care ?- Has a competent adult at home to recover with post-op recover ?- NO history of ? - Chronic pain requiring opiods ? - Diabetes ? - Coronary Artery Disease ? - Heart failure ? - Heart attack ? - Stroke ? - DVT/VTE ? - Cardiac arrhythmia ? - Respiratory Failure/COPD ? - Renal failure ? - Anemia ? - Advanced Liver disease ? ?  ? ?Alicia Crawford is an 66 y.o. female.   ? ?Chief Complaint: left shoulder pain ? ?HPI: Pt is a 66 y.o. female complaining of left shoulder pain after recent fall. Pain had continually increased since the beginning. X-rays in the clinic show proximal humerus fracture of left humerus. Pt has tried various conservative treatments which have failed to alleviate their symptoms, i Risks, benefits and expectations were discussed with the patient. Patient understand the risks, benefits and expectations and wishes to proceed with surgery.  ? ?PCP:  Eunice Blase, MD ? ?D/C Plans: Home ? ?PMH: ?Past Medical History:  ?Diagnosis Date  ? Ankle fracture 09/05/2016  ? right ankle  ? Anoxic brain injury ([place]) 08/2010  ? "w/cardiac arrest"  ? Anxiety Jan. 2012  ? Blood transfusion 09/2010  ? Cardiac arrest ([place]) 08/04/2010  ? Carotid artery occlusion   ? mild to moderate bilateral internal carotid artery stenosis  ? Claudication [facility])   ? Complication of anesthesia   ? [place]  ? Coronary artery disease   ? CABG 2011 in [place]  ? Coronary artery disease   ? Dementia   ? Depression   ? DVT (deep venous thrombosis) ([place])   ? GERD (gastroesophageal reflux disease)   ? High cholesterol   ? Hypertension   ? Myocardial infarction ([place]) 09/27/10  ? in [place]  ? Obesity   ? Peripheral vascular disease ([place])   ? bilat SFA diseae  ? Renal artery stenosis ([place])   ? Rotator cuff tendonitis   ? Sleep apnea   ? uses CPAP nightly  ? Stroke [facility]) 2011  ? Subdural hematoma May  25,2013  ? Frontal subdural  S/P evacuation of hematoma  ? Type II diabetes mellitus ([place])   ? Diet and Exercise  ? ? ?PSH: ?Past Surgical History:  ?Procedure Laterality Date  ? ABDOMINOPLASTY/PANNICULECTOMY    ? Had wound complication with MRSA after  ? ATHERECTOMY N/A 01/17/2012  ? Procedure: ATHERECTOMY;  Surgeon: Lorretta Harp, MD;  Location: [facility] CATH LAB;  Service: Cardiovascular;  Laterality: N/A;  ? BACK SURGERY    ? CHOLECYSTECTOMY  ~ 1980's  ? CORONARY ARTERY BYPASS GRAFT  09/2010  ? CABG X4  ? CRANIOTOMY  02/25/2012  ? Procedure: CRANIOTOMY HEMATOMA EVACUATION SUBDURAL;  Surgeon: Eustace Moore, MD;  Location: [place] NEURO ORS;  Service: Neurosurgery;  Laterality: Left;  Left craniotomy for evacuation of subdural hematoma  ? LOWER EXTREMITY ANGIOGRAM  April 2013  ? ORIF TOE FRACTURE Left 04/25/2019  ? Procedure: Open Reduction Internal Fixation (ORIF) Left Fifth Metatarsal Ronnald Ramp Fracture;  Surgeon: Wylene Simmer, MD;  Location: [place];  Service: Orthopedics;  Laterality: Left;  ? OVARIAN CYST REMOVAL  1983  ? POSTERIOR FUSION LUMBAR SPINE  ` 2000  ? SPINE SURGERY    ? stomach and skin tuck  2000  ? ? ?Social History:  reports that she quit smoking about 18 years ago. Her smoking use  included cigarettes. She has a 30.00 pack-year smoking history. She has never used smokeless tobacco. She reports that she does not currently use alcohol. She reports that she does not use drugs. ? ?Allergies:  ?Allergies  ?Allergen Reactions  ? [place] Hives and Other (See Comments)  ?  Gas used 1980's ?Reaction: affected her liver  ? ? ?Medications: ?No current facility-administered medications for this encounter.  ? ?Current Outpatient Medications  ?Medication Sig Dispense Refill  ? acetaminophen (TYLENOL) 650 MG CR tablet Take 650 mg by mouth every 6 (six) hours as needed for pain.    ? ALPRAZolam (XANAX) 0.5 MG tablet Take 1/2-1 tablet twice daily as needed for severe anxiety 60 tablet 2  ? amLODipine  (NORVASC) 5 MG tablet Take 5 mg by mouth daily.    ? Ascorbic Acid (VITAMIN C) 100 MG tablet Take 100 mg by mouth daily. (Patient not taking: Reported on 09/29/2021)    ? aspirin EC 81 MG tablet Take 81 mg by mouth daily.    ? atorvastatin (LIPITOR) 80 MG tablet TAKE 1 TABLET BY MOUTH DAILY (Patient taking differently: Take 80 mg by mouth daily.) 90 tablet 3  ? chlorpheniramine (CHLOR-TRIMETON) 4 MG tablet Take 1 tablet (4 mg total) by mouth at bedtime as needed for allergies.    ? Cholecalciferol (VITAMIN D3) 1.25 MG (50000 UT) CAPS Take 1 capsule by mouth daily.    ? cilostazol (PLETAL) 100 MG tablet TAKE 1 TABLET BY MOUTH TWICE DAILY BEFORE MEALS 180 tablet 3  ? Coenzyme Q10 300 MG CAPS Take 300 mg by mouth daily. (Patient not taking: Reported on 09/29/2021)    ? Cyanocobalamin 1000 MCG/ML KIT Inject as directed. (Patient not taking: Reported on 09/29/2021)    ? estradiol (ESTRACE) 0.1 MG/GM vaginal cream Place 0.5 g vaginally 2 (two) times a week.    ? Evolocumab (REPATHA SURECLICK) 245 MG/ML SOAJ Inject 140 mg into the skin every 14 (fourteen) days. 2 mL 11  ? fluticasone (FLONASE) 50 MCG/ACT nasal spray SHAKE LIQUID AND USE 1 SPRAY IN EACH NOSTRIL DAILY 16 g 3  ? loratadine (CLARITIN) 10 MG tablet TAKE 1 TABLET(10 MG) BY MOUTH DAILY 30 tablet 11  ? magnesium oxide (MAG-OX) 400 MG tablet Take 400 mg by mouth 2 (two) times daily. (Patient not taking: Reported on 09/29/2021)    ? Melatonin 10 MG TABS Take 5 mg by mouth at bedtime as needed (sleep).    ? metFORMIN (GLUCOPHAGE) 1000 MG tablet Take 1,000 mg by mouth 2 (two) times daily with a meal.  0  ? metoprolol tartrate (LOPRESSOR) 25 MG tablet TAKE 1 TABLET(25 MG) BY MOUTH TWICE DAILY 180 tablet 3  ? oxyCODONE-acetaminophen (PERCOCET/ROXICET) 5-325 MG tablet Take 1 tablet by mouth every 6 (six) hours as needed for severe pain. 15 tablet 0  ? pantoprazole (PROTONIX) 40 MG tablet TAKE 1 TABLET(40 MG) BY MOUTH DAILY 90 tablet 3  ? PREBIOTIC PRODUCT PO Take 1  capsule by mouth daily. (Patient not taking: Reported on 09/29/2021)    ? Probiotic Product (PROBIOTIC-10 PO) Take 1 capsule by mouth daily. (Patient not taking: Reported on 09/29/2021)    ? QUERCETIN PO Take 1,000 mg by mouth in the morning and at bedtime. (Patient not taking: Reported on 09/29/2021)    ? senna-docusate (SENOKOT-S) 8.6-50 MG tablet Take 1 tablet by mouth at bedtime as needed for mild constipation or moderate constipation. 20 tablet 0  ? sertraline (ZOLOFT) 100 MG tablet Take 1 tablet (100 mg  total) by mouth in the morning and at bedtime. 180 tablet 1  ? zinc gluconate 50 MG tablet Take 50 mg by mouth daily. (Patient not taking: Reported on 09/29/2021)    ? ? ?No results found for this or any previous visit (from the past 48 hour(s)). ?DG Shoulder Left ? ?Result Date: 12/29/2021 ?CLINICAL DATA:  Status post trauma. EXAM: LEFT SHOULDER - 2+ VIEW COMPARISON:  None. FINDINGS: An acute, impacted fracture deformity is seen involving the head and neck of the proximal left humerus. There is no evidence of dislocation. Multiple sternal wires and vascular clips are seen. Soft tissue swelling is noted along the lateral aspect of the left shoulder. IMPRESSION: Acute fracture of the proximal left humerus. Electronically Signed   By: Virgina Norfolk M.D.   On: 12/29/2021 21:01   ? ?ROS: ?Pain with rom of the left upper extremity ? ?Physical Exam: ?Alert and oriented 66 y.o. female in no acute distress ?Cranial nerves 2-12 intact ?Cervical spine: full rom with no tenderness, nv intact distally ?Chest: active breath sounds bilaterally, no wheeze rhonchi or rales ?Heart: regular rate and rhythm, no murmur ?Abd: non tender non distended with active bowel sounds ?Hip is stable with rom  ?Left upper extremity with swelling and ecchymosis ?Unable to move left arm well ? ?Assessment/Plan ?Assessment: left proximal humerus fracture ? ?Plan: ? ?Patient will undergo a left reverse total shoulder by Dr. Veverly Fells at [facility]  Risks benefits and expectations were discussed with the patient. Patient understand risks, benefits and expectations and wishes to proceed. ?Preoperative templating of the joint replacement has been completed, document

## 2021-12-30 NOTE — Progress Notes (Signed)
Surgery orders requested via Epic inbox. °

## 2021-12-31 ENCOUNTER — Other Ambulatory Visit: Payer: Self-pay

## 2021-12-31 ENCOUNTER — Encounter (HOSPITAL_COMMUNITY): Payer: Self-pay | Admitting: Orthopedic Surgery

## 2021-12-31 ENCOUNTER — Inpatient Hospital Stay (HOSPITAL_COMMUNITY)
Admission: RE | Admit: 2021-12-31 | Discharge: 2022-01-03 | DRG: 483 | Disposition: A | Discharge status: Home / Self Care | Payer: PPO | Attending: Orthopedic Surgery | Admitting: Orthopedic Surgery

## 2021-12-31 ENCOUNTER — Encounter (HOSPITAL_COMMUNITY)
Admission: RE | Disposition: A | Discharge status: Home / Self Care | Payer: Self-pay | Source: Home / Self Care | Attending: Orthopedic Surgery

## 2021-12-31 ENCOUNTER — Inpatient Hospital Stay (HOSPITAL_COMMUNITY): Payer: PPO | Admitting: Physician Assistant

## 2021-12-31 ENCOUNTER — Observation Stay (HOSPITAL_COMMUNITY): Payer: PPO

## 2021-12-31 DIAGNOSIS — Z471 Aftercare following joint replacement surgery: Secondary | ICD-10-CM | POA: Diagnosis not present

## 2021-12-31 DIAGNOSIS — Z888 Allergy status to other drugs, medicaments and biological substances status: Secondary | ICD-10-CM

## 2021-12-31 DIAGNOSIS — Z8674 Personal history of sudden cardiac arrest: Secondary | ICD-10-CM

## 2021-12-31 DIAGNOSIS — S42202A Unspecified fracture of upper end of left humerus, initial encounter for closed fracture: Secondary | ICD-10-CM

## 2021-12-31 DIAGNOSIS — Z96612 Presence of left artificial shoulder joint: Secondary | ICD-10-CM

## 2021-12-31 DIAGNOSIS — I6529 Occlusion and stenosis of unspecified carotid artery: Secondary | ICD-10-CM | POA: Diagnosis present

## 2021-12-31 DIAGNOSIS — I2510 Atherosclerotic heart disease of native coronary artery without angina pectoris: Secondary | ICD-10-CM | POA: Diagnosis present

## 2021-12-31 DIAGNOSIS — F0390 Unspecified dementia without behavioral disturbance: Secondary | ICD-10-CM | POA: Diagnosis present

## 2021-12-31 DIAGNOSIS — S4292XD Fracture of left shoulder girdle, part unspecified, subsequent encounter for fracture with routine healing: Secondary | ICD-10-CM | POA: Diagnosis not present

## 2021-12-31 DIAGNOSIS — I10 Essential (primary) hypertension: Secondary | ICD-10-CM

## 2021-12-31 DIAGNOSIS — I252 Old myocardial infarction: Secondary | ICD-10-CM

## 2021-12-31 DIAGNOSIS — N189 Chronic kidney disease, unspecified: Secondary | ICD-10-CM | POA: Diagnosis present

## 2021-12-31 DIAGNOSIS — Y92019 Unspecified place in single-family (private) house as the place of occurrence of the external cause: Secondary | ICD-10-CM

## 2021-12-31 DIAGNOSIS — M7989 Other specified soft tissue disorders: Secondary | ICD-10-CM | POA: Diagnosis not present

## 2021-12-31 DIAGNOSIS — E1151 Type 2 diabetes mellitus with diabetic peripheral angiopathy without gangrene: Secondary | ICD-10-CM | POA: Diagnosis present

## 2021-12-31 DIAGNOSIS — Z6829 Body mass index (BMI) 29.0-29.9, adult: Secondary | ICD-10-CM

## 2021-12-31 DIAGNOSIS — G8918 Other acute postprocedural pain: Secondary | ICD-10-CM | POA: Diagnosis not present

## 2021-12-31 DIAGNOSIS — Z8673 Personal history of transient ischemic attack (TIA), and cerebral infarction without residual deficits: Secondary | ICD-10-CM

## 2021-12-31 DIAGNOSIS — F419 Anxiety disorder, unspecified: Secondary | ICD-10-CM | POA: Diagnosis present

## 2021-12-31 DIAGNOSIS — W19XXXD Unspecified fall, subsequent encounter: Secondary | ICD-10-CM | POA: Diagnosis not present

## 2021-12-31 DIAGNOSIS — K219 Gastro-esophageal reflux disease without esophagitis: Secondary | ICD-10-CM | POA: Diagnosis present

## 2021-12-31 DIAGNOSIS — E669 Obesity, unspecified: Secondary | ICD-10-CM | POA: Diagnosis present

## 2021-12-31 DIAGNOSIS — Z79899 Other long term (current) drug therapy: Secondary | ICD-10-CM

## 2021-12-31 DIAGNOSIS — W541XXA Struck by dog, initial encounter: Secondary | ICD-10-CM

## 2021-12-31 DIAGNOSIS — S42252A Displaced fracture of greater tuberosity of left humerus, initial encounter for closed fracture: Secondary | ICD-10-CM | POA: Diagnosis not present

## 2021-12-31 DIAGNOSIS — Z01818 Encounter for other preprocedural examination: Secondary | ICD-10-CM

## 2021-12-31 DIAGNOSIS — G4733 Obstructive sleep apnea (adult) (pediatric): Secondary | ICD-10-CM | POA: Diagnosis present

## 2021-12-31 DIAGNOSIS — Z96642 Presence of left artificial hip joint: Secondary | ICD-10-CM | POA: Diagnosis not present

## 2021-12-31 DIAGNOSIS — Z7984 Long term (current) use of oral hypoglycemic drugs: Secondary | ICD-10-CM

## 2021-12-31 DIAGNOSIS — Z951 Presence of aortocoronary bypass graft: Secondary | ICD-10-CM

## 2021-12-31 DIAGNOSIS — Z87891 Personal history of nicotine dependence: Secondary | ICD-10-CM

## 2021-12-31 DIAGNOSIS — E119 Type 2 diabetes mellitus without complications: Secondary | ICD-10-CM

## 2021-12-31 DIAGNOSIS — E1122 Type 2 diabetes mellitus with diabetic chronic kidney disease: Secondary | ICD-10-CM | POA: Diagnosis present

## 2021-12-31 DIAGNOSIS — Z7982 Long term (current) use of aspirin: Secondary | ICD-10-CM

## 2021-12-31 DIAGNOSIS — E7800 Pure hypercholesterolemia, unspecified: Secondary | ICD-10-CM | POA: Diagnosis present

## 2021-12-31 HISTORY — PX: REVERSE SHOULDER ARTHROPLASTY: SHX5054

## 2021-12-31 LAB — BASIC METABOLIC PANEL
Anion gap: 12 (ref 5–15)
BUN: 49 mg/dL — ABNORMAL HIGH (ref 8–23)
CO2: 16 mmol/L — ABNORMAL LOW (ref 22–32)
Calcium: 9.2 mg/dL (ref 8.9–10.3)
Chloride: 105 mmol/L (ref 98–111)
Creatinine, Ser: 2.25 mg/dL — ABNORMAL HIGH (ref 0.44–1.00)
GFR, Estimated: 23 mL/min — ABNORMAL LOW (ref >60)
Glucose, Bld: 137 mg/dL — ABNORMAL HIGH (ref 70–99)
Potassium: 4.2 mmol/L (ref 3.5–5.1)
Sodium: 133 mmol/L — ABNORMAL LOW (ref 135–145)

## 2021-12-31 LAB — CBC
HCT: 28.7 % — ABNORMAL LOW (ref 36.0–46.0)
Hemoglobin: 9.1 g/dL — ABNORMAL LOW (ref 12.0–15.0)
MCH: 30 pg (ref 26.0–34.0)
MCHC: 31.7 g/dL (ref 30.0–36.0)
MCV: 94.7 fL (ref 80.0–100.0)
Platelets: 280 10*3/uL (ref 150–400)
RBC: 3.03 MIL/uL — ABNORMAL LOW (ref 3.87–5.11)
RDW: 15.4 % (ref 11.5–15.5)
WBC: 10.2 10*3/uL (ref 4.0–10.5)
nRBC: 0 % (ref 0.0–0.2)

## 2021-12-31 LAB — SURGICAL PCR SCREEN
MRSA, PCR: NEGATIVE
Staphylococcus aureus: POSITIVE — AB

## 2021-12-31 LAB — HEMOGLOBIN A1C
Hgb A1c MFr Bld: 5.6 % (ref 4.8–5.6)
Mean Plasma Glucose: 114.02 mg/dL

## 2021-12-31 LAB — GLUCOSE, CAPILLARY
Glucose-Capillary: 110 mg/dL — ABNORMAL HIGH (ref 70–99)
Glucose-Capillary: 132 mg/dL — ABNORMAL HIGH (ref 70–99)

## 2021-12-31 SURGERY — ARTHROPLASTY, SHOULDER, TOTAL, REVERSE
Anesthesia: Regional | Site: Shoulder | Laterality: Left

## 2021-12-31 MED ORDER — ORAL CARE MOUTH RINSE: 15.0000 mL | Freq: Once | OROMUCOSAL | Status: AC

## 2021-12-31 MED ORDER — ONDANSETRON HCL 4 MG/2ML IJ SOLN
INTRAMUSCULAR | Status: AC
  Filled 2021-12-31: qty 2

## 2021-12-31 MED ORDER — CHLORHEXIDINE GLUCONATE 0.12 % MT SOLN
15.0000 mL | Freq: Once | OROMUCOSAL | Status: AC
  Administered 2021-12-31: 15 mL via OROMUCOSAL

## 2021-12-31 MED ORDER — ALPRAZOLAM 0.25 MG PO TABS
0.2500 mg | ORAL_TABLET | Freq: Two times a day (BID) | ORAL | Status: DC | PRN
  Administered 2022-01-01 (×2): 0.25 mg via ORAL
  Administered 2022-01-02: 0.5 mg via ORAL
  Filled 2021-12-31: qty 2
  Filled 2021-12-31 (×2): qty 1

## 2021-12-31 MED ORDER — INSULIN ASPART 100 UNIT/ML IJ SOLN
0.0000 [IU] | Freq: Three times a day (TID) | INTRAMUSCULAR | Status: DC
  Administered 2022-01-01 – 2022-01-02 (×2): 3 [IU] via SUBCUTANEOUS

## 2021-12-31 MED ORDER — TRANEXAMIC ACID-NACL 1000-0.7 MG/100ML-% IV SOLN
INTRAVENOUS | Status: AC
  Filled 2021-12-31: qty 100

## 2021-12-31 MED ORDER — MUPIROCIN 2 % EX OINT
1.0000 "application " | TOPICAL_OINTMENT | Freq: Two times a day (BID) | CUTANEOUS | Status: DC
  Administered 2021-12-31 – 2022-01-03 (×6): 1 via NASAL
  Filled 2021-12-31: qty 22

## 2021-12-31 MED ORDER — CEFAZOLIN SODIUM-DEXTROSE 2-4 GM/100ML-% IV SOLN
2.0000 g | INTRAVENOUS | Status: AC
  Administered 2021-12-31: 2 g via INTRAVENOUS
  Filled 2021-12-31: qty 100

## 2021-12-31 MED ORDER — ACETAMINOPHEN 160 MG/5ML PO SOLN: 1000.0000 mg | Freq: Once | ORAL | Status: DC | PRN

## 2021-12-31 MED ORDER — METOCLOPRAMIDE HCL 5 MG PO TABS: 5.0000 mg | ORAL_TABLET | Freq: Three times a day (TID) | ORAL | Status: DC | PRN

## 2021-12-31 MED ORDER — BISACODYL 10 MG RE SUPP: 10.0000 mg | Freq: Every day | RECTAL | Status: DC | PRN

## 2021-12-31 MED ORDER — ACETAMINOPHEN ER 650 MG PO TBCR: 650.0000 mg | EXTENDED_RELEASE_TABLET | Freq: Four times a day (QID) | ORAL | Status: DC | PRN

## 2021-12-31 MED ORDER — PHENYLEPHRINE HCL-NACL 20-0.9 MG/250ML-% IV SOLN
INTRAVENOUS | Status: AC
  Filled 2021-12-31: qty 250

## 2021-12-31 MED ORDER — SENNOSIDES-DOCUSATE SODIUM 8.6-50 MG PO TABS: 1.0000 | ORAL_TABLET | Freq: Every evening | ORAL | Status: DC | PRN

## 2021-12-31 MED ORDER — TRANEXAMIC ACID-NACL 1000-0.7 MG/100ML-% IV SOLN
1000.0000 mg | Freq: Once | INTRAVENOUS | Status: AC
  Administered 2021-12-31: 1000 mg via INTRAVENOUS

## 2021-12-31 MED ORDER — INSULIN ASPART 100 UNIT/ML IJ SOLN: 0.0000 [IU] | Freq: Every day | INTRAMUSCULAR | Status: DC

## 2021-12-31 MED ORDER — PROPOFOL 10 MG/ML IV BOLUS
INTRAVENOUS | Status: DC | PRN
  Administered 2021-12-31: 120 mg via INTRAVENOUS
  Administered 2021-12-31: 50 mg via INTRAVENOUS
  Administered 2021-12-31: 40 mg via INTRAVENOUS

## 2021-12-31 MED ORDER — CEFAZOLIN SODIUM-DEXTROSE 2-4 GM/100ML-% IV SOLN
2.0000 g | Freq: Four times a day (QID) | INTRAVENOUS | Status: AC
  Administered 2021-12-31 – 2022-01-01 (×3): 2 g via INTRAVENOUS
  Filled 2021-12-31 (×3): qty 100

## 2021-12-31 MED ORDER — DOCUSATE SODIUM 100 MG PO CAPS
100.0000 mg | ORAL_CAPSULE | Freq: Two times a day (BID) | ORAL | Status: DC
  Administered 2021-12-31 – 2022-01-03 (×6): 100 mg via ORAL
  Filled 2021-12-31 (×6): qty 1

## 2021-12-31 MED ORDER — METHOCARBAMOL 500 MG PO TABS
500.0000 mg | ORAL_TABLET | Freq: Four times a day (QID) | ORAL | Status: DC | PRN
  Administered 2022-01-01 – 2022-01-02 (×2): 500 mg via ORAL
  Filled 2021-12-31 (×2): qty 1

## 2021-12-31 MED ORDER — MELATONIN 5 MG PO TABS: 5.0000 mg | ORAL_TABLET | Freq: Every evening | ORAL | Status: DC | PRN

## 2021-12-31 MED ORDER — BUPIVACAINE LIPOSOME 1.3 % IJ SUSP
INTRAMUSCULAR | Status: DC | PRN
  Administered 2021-12-31: 133 mg via PERINEURAL

## 2021-12-31 MED ORDER — ALBUMIN HUMAN 5 % IV SOLN: INTRAVENOUS | Status: DC | PRN

## 2021-12-31 MED ORDER — ONDANSETRON HCL 4 MG/2ML IJ SOLN
INTRAMUSCULAR | Status: DC | PRN
  Administered 2021-12-31: 4 mg via INTRAVENOUS

## 2021-12-31 MED ORDER — SUCCINYLCHOLINE CHLORIDE 200 MG/10ML IV SOSY
PREFILLED_SYRINGE | INTRAVENOUS | Status: AC
  Filled 2021-12-31: qty 10

## 2021-12-31 MED ORDER — ZINC GLUCONATE 50 MG PO TABS: 50.0000 mg | ORAL_TABLET | Freq: Every day | ORAL | Status: DC

## 2021-12-31 MED ORDER — PHENYLEPHRINE HCL-NACL 20-0.9 MG/250ML-% IV SOLN
INTRAVENOUS | Status: DC | PRN
  Administered 2021-12-31: 20 ug/min via INTRAVENOUS

## 2021-12-31 MED ORDER — ALBUMIN HUMAN 5 % IV SOLN
INTRAVENOUS | Status: AC
  Filled 2021-12-31: qty 500

## 2021-12-31 MED ORDER — ACETAMINOPHEN 10 MG/ML IV SOLN: 1000.0000 mg | Freq: Once | INTRAVENOUS | Status: DC | PRN

## 2021-12-31 MED ORDER — FENTANYL CITRATE PF 50 MCG/ML IJ SOSY
50.0000 ug | PREFILLED_SYRINGE | INTRAMUSCULAR | Status: DC
  Administered 2021-12-31: 100 ug via INTRAVENOUS
  Filled 2021-12-31: qty 2

## 2021-12-31 MED ORDER — ACETAMINOPHEN 325 MG PO TABS
325.0000 mg | ORAL_TABLET | Freq: Four times a day (QID) | ORAL | Status: DC | PRN
  Administered 2022-01-01 – 2022-01-02 (×4): 650 mg via ORAL
  Filled 2021-12-31 (×3): qty 2

## 2021-12-31 MED ORDER — METFORMIN HCL 500 MG PO TABS: 1000.0000 mg | ORAL_TABLET | Freq: Two times a day (BID) | ORAL | Status: DC

## 2021-12-31 MED ORDER — LACTATED RINGERS IV SOLN: INTRAVENOUS | Status: DC

## 2021-12-31 MED ORDER — METHOCARBAMOL 500 MG PO TABS: 500.0000 mg | ORAL_TABLET | Freq: Three times a day (TID) | ORAL | 1 refills | Status: DC | PRN

## 2021-12-31 MED ORDER — ACETAMINOPHEN 10 MG/ML IV SOLN
INTRAVENOUS | Status: AC
  Filled 2021-12-31: qty 100

## 2021-12-31 MED ORDER — CHLORHEXIDINE GLUCONATE CLOTH 2 % EX PADS
6.0000 | MEDICATED_PAD | Freq: Every day | CUTANEOUS | Status: DC
  Administered 2022-01-01 – 2022-01-03 (×3): 6 via TOPICAL

## 2021-12-31 MED ORDER — ESTRADIOL 0.1 MG/GM VA CREA: 0.5000 g | TOPICAL_CREAM | VAGINAL | Status: DC

## 2021-12-31 MED ORDER — METHOCARBAMOL 1000 MG/10ML IJ SOLN
500.0000 mg | Freq: Four times a day (QID) | INTRAVENOUS | Status: DC | PRN
  Filled 2021-12-31: qty 5

## 2021-12-31 MED ORDER — LORATADINE 10 MG PO TABS: 10.0000 mg | ORAL_TABLET | Freq: Every day | ORAL | Status: DC | PRN

## 2021-12-31 MED ORDER — LIDOCAINE HCL (PF) 2 % IJ SOLN
INTRAMUSCULAR | Status: AC
  Filled 2021-12-31: qty 5

## 2021-12-31 MED ORDER — OXYCODONE-ACETAMINOPHEN 5-325 MG PO TABS: 1.0000 | ORAL_TABLET | Freq: Four times a day (QID) | ORAL | 0 refills | Status: DC | PRN

## 2021-12-31 MED ORDER — FENTANYL CITRATE PF 50 MCG/ML IJ SOSY: 25.0000 ug | PREFILLED_SYRINGE | INTRAMUSCULAR | Status: DC | PRN

## 2021-12-31 MED ORDER — ONDANSETRON HCL 4 MG PO TABS: 4.0000 mg | ORAL_TABLET | Freq: Three times a day (TID) | ORAL | 1 refills | Status: DC | PRN

## 2021-12-31 MED ORDER — METOCLOPRAMIDE HCL 5 MG/ML IJ SOLN: 5.0000 mg | Freq: Three times a day (TID) | INTRAMUSCULAR | Status: DC | PRN

## 2021-12-31 MED ORDER — OXYCODONE HCL 5 MG PO TABS: 5.0000 mg | ORAL_TABLET | Freq: Once | ORAL | Status: DC | PRN

## 2021-12-31 MED ORDER — BUPIVACAINE-EPINEPHRINE (PF) 0.25% -1:200000 IJ SOLN
INTRAMUSCULAR | Status: AC
  Filled 2021-12-31: qty 30

## 2021-12-31 MED ORDER — OXYCODONE HCL 5 MG/5ML PO SOLN: 5.0000 mg | Freq: Once | ORAL | Status: DC | PRN

## 2021-12-31 MED ORDER — AMLODIPINE BESYLATE 5 MG PO TABS
5.0000 mg | ORAL_TABLET | Freq: Every day | ORAL | Status: DC
  Administered 2022-01-02 – 2022-01-03 (×2): 5 mg via ORAL
  Filled 2021-12-31 (×2): qty 1

## 2021-12-31 MED ORDER — BUPIVACAINE-EPINEPHRINE (PF) 0.25% -1:200000 IJ SOLN
INTRAMUSCULAR | Status: DC | PRN
  Administered 2021-12-31: 30 mL

## 2021-12-31 MED ORDER — PHENYLEPHRINE HCL-NACL 20-0.9 MG/250ML-% IV SOLN: INTRAVENOUS | Status: DC | PRN

## 2021-12-31 MED ORDER — EVOLOCUMAB 140 MG/ML ~~LOC~~ SOAJ: 140.0000 mg | SUBCUTANEOUS | Status: DC

## 2021-12-31 MED ORDER — POLYETHYLENE GLYCOL 3350 17 G PO PACK: 17.0000 g | PACK | Freq: Every day | ORAL | Status: DC | PRN

## 2021-12-31 MED ORDER — OXYCODONE HCL 5 MG PO TABS
5.0000 mg | ORAL_TABLET | ORAL | Status: DC | PRN
  Administered 2022-01-01: 5 mg via ORAL
  Administered 2022-01-02 – 2022-01-03 (×5): 10 mg via ORAL
  Filled 2021-12-31: qty 1
  Filled 2021-12-31 (×5): qty 2

## 2021-12-31 MED ORDER — ASPIRIN EC 81 MG PO TBEC
81.0000 mg | DELAYED_RELEASE_TABLET | Freq: Every day | ORAL | Status: DC
  Administered 2021-12-31 – 2022-01-03 (×3): 81 mg via ORAL
  Filled 2021-12-31 (×3): qty 1

## 2021-12-31 MED ORDER — SERTRALINE HCL 100 MG PO TABS
100.0000 mg | ORAL_TABLET | Freq: Two times a day (BID) | ORAL | Status: DC
  Administered 2021-12-31 – 2022-01-03 (×6): 100 mg via ORAL
  Filled 2021-12-31 (×6): qty 1

## 2021-12-31 MED ORDER — BUPIVACAINE-EPINEPHRINE (PF) 0.5% -1:200000 IJ SOLN
INTRAMUSCULAR | Status: DC | PRN
  Administered 2021-12-31: 15 mL via PERINEURAL

## 2021-12-31 MED ORDER — COENZYME Q10 300 MG PO CAPS: 300.0000 mg | ORAL_CAPSULE | Freq: Every day | ORAL | Status: DC

## 2021-12-31 MED ORDER — PANTOPRAZOLE SODIUM 40 MG PO TBEC
40.0000 mg | DELAYED_RELEASE_TABLET | Freq: Every day | ORAL | Status: DC
  Administered 2021-12-31 – 2022-01-03 (×4): 40 mg via ORAL
  Filled 2021-12-31 (×4): qty 1

## 2021-12-31 MED ORDER — PHENYLEPHRINE 40 MCG/ML (10ML) SYRINGE FOR IV PUSH (FOR BLOOD PRESSURE SUPPORT)
PREFILLED_SYRINGE | INTRAVENOUS | Status: DC | PRN
  Administered 2021-12-31: 120 ug via INTRAVENOUS
  Administered 2021-12-31: 80 ug via INTRAVENOUS
  Administered 2021-12-31: 120 ug via INTRAVENOUS
  Administered 2021-12-31: 80 ug via INTRAVENOUS
  Administered 2021-12-31: 120 ug via INTRAVENOUS

## 2021-12-31 MED ORDER — ACETAMINOPHEN 500 MG PO TABS: 1000.0000 mg | ORAL_TABLET | Freq: Once | ORAL | Status: DC | PRN

## 2021-12-31 MED ORDER — MAGNESIUM OXIDE -MG SUPPLEMENT 400 (240 MG) MG PO TABS
400.0000 mg | ORAL_TABLET | Freq: Two times a day (BID) | ORAL | Status: DC
  Administered 2021-12-31 – 2022-01-03 (×6): 400 mg via ORAL
  Filled 2021-12-31 (×6): qty 1

## 2021-12-31 MED ORDER — ACETAMINOPHEN 10 MG/ML IV SOLN
INTRAVENOUS | Status: DC | PRN
  Administered 2021-12-31: 1000 mg via INTRAVENOUS

## 2021-12-31 MED ORDER — VITAMIN C 100 MG PO TABS
100.0000 mg | ORAL_TABLET | Freq: Every day | ORAL | Status: DC
Start: 2021-12-31 — End: 2021-12-31

## 2021-12-31 MED ORDER — LIDOCAINE 2% (20 MG/ML) 5 ML SYRINGE
INTRAMUSCULAR | Status: DC | PRN
  Administered 2021-12-31: 20 mg via INTRAVENOUS

## 2021-12-31 MED ORDER — SODIUM CHLORIDE 0.9 % IR SOLN
Status: DC | PRN
  Administered 2021-12-31: 1000 mL

## 2021-12-31 MED ORDER — SUCCINYLCHOLINE CHLORIDE 200 MG/10ML IV SOSY
PREFILLED_SYRINGE | INTRAVENOUS | Status: DC | PRN
  Administered 2021-12-31: 120 mg via INTRAVENOUS

## 2021-12-31 MED ORDER — MIDAZOLAM HCL 2 MG/2ML IJ SOLN: 1.0000 mg | INTRAMUSCULAR | Status: DC

## 2021-12-31 MED ORDER — PHENYLEPHRINE 40 MCG/ML (10ML) SYRINGE FOR IV PUSH (FOR BLOOD PRESSURE SUPPORT)
PREFILLED_SYRINGE | INTRAVENOUS | Status: AC
  Filled 2021-12-31: qty 10

## 2021-12-31 MED ORDER — HYDROMORPHONE HCL 1 MG/ML IJ SOLN: 0.5000 mg | INTRAMUSCULAR | Status: DC | PRN

## 2021-12-31 MED ORDER — METOPROLOL TARTRATE 25 MG PO TABS
25.0000 mg | ORAL_TABLET | Freq: Two times a day (BID) | ORAL | Status: DC
  Administered 2021-12-31 – 2022-01-03 (×5): 25 mg via ORAL
  Filled 2021-12-31 (×5): qty 1

## 2021-12-31 MED ORDER — DEXAMETHASONE SODIUM PHOSPHATE 10 MG/ML IJ SOLN
INTRAMUSCULAR | Status: DC | PRN
  Administered 2021-12-31: 4 mg via INTRAVENOUS

## 2021-12-31 MED ORDER — PHENOL 1.4 % MT LIQD: 1.0000 | OROMUCOSAL | Status: DC | PRN

## 2021-12-31 MED ORDER — VITAMIN D3 1.25 MG (50000 UT) PO CAPS: 1.0000 | ORAL_CAPSULE | Freq: Every day | ORAL | Status: DC

## 2021-12-31 MED ORDER — FLUTICASONE PROPIONATE 50 MCG/ACT NA SUSP
1.0000 | Freq: Every day | NASAL | Status: DC
  Administered 2022-01-01 – 2022-01-03 (×3): 1 via NASAL
  Filled 2021-12-31: qty 16

## 2021-12-31 MED ORDER — PROPOFOL 10 MG/ML IV BOLUS
INTRAVENOUS | Status: AC
  Filled 2021-12-31: qty 20

## 2021-12-31 MED ORDER — DIPHENHYDRAMINE HCL 25 MG PO CAPS
25.0000 mg | ORAL_CAPSULE | Freq: Four times a day (QID) | ORAL | Status: DC | PRN
Start: 2021-12-31 — End: 2022-01-03

## 2021-12-31 MED ORDER — ONDANSETRON HCL 4 MG PO TABS: 4.0000 mg | ORAL_TABLET | Freq: Four times a day (QID) | ORAL | Status: DC | PRN

## 2021-12-31 MED ORDER — MENTHOL 3 MG MT LOZG: 1.0000 | LOZENGE | OROMUCOSAL | Status: DC | PRN

## 2021-12-31 MED ORDER — SODIUM CHLORIDE 0.9 % IV SOLN: INTRAVENOUS | Status: DC

## 2021-12-31 MED ORDER — CILOSTAZOL 100 MG PO TABS
100.0000 mg | ORAL_TABLET | Freq: Two times a day (BID) | ORAL | Status: DC
  Administered 2022-01-01 – 2022-01-03 (×5): 100 mg via ORAL
  Filled 2021-12-31 (×6): qty 1

## 2021-12-31 MED ORDER — INSULIN ASPART 100 UNIT/ML IJ SOLN
6.0000 [IU] | Freq: Three times a day (TID) | INTRAMUSCULAR | Status: DC
  Administered 2022-01-01 – 2022-01-03 (×7): 6 [IU] via SUBCUTANEOUS

## 2021-12-31 MED ORDER — ONDANSETRON HCL 4 MG/2ML IJ SOLN: 4.0000 mg | Freq: Four times a day (QID) | INTRAMUSCULAR | Status: DC | PRN

## 2021-12-31 MED ORDER — ATORVASTATIN CALCIUM 40 MG PO TABS
80.0000 mg | ORAL_TABLET | Freq: Every day | ORAL | Status: DC
Start: 2021-12-31 — End: 2022-01-03
  Administered 2021-12-31 – 2022-01-03 (×4): 80 mg via ORAL
  Filled 2021-12-31 (×4): qty 2

## 2021-12-31 SURGICAL SUPPLY — 76 items
BAG COUNTER SPONGE SURGICOUNT (BAG) IMPLANT
BAG ZIPLOCK 12X15 (MISCELLANEOUS) IMPLANT
BIT DRILL 1.6MX128 (BIT) ×1 IMPLANT
BIT DRILL 170X2.5X (BIT) IMPLANT
BIT DRL 170X2.5X (BIT) ×1
BLADE SAG 18X100X1.27 (BLADE) ×2 IMPLANT
BOWL SMART MIX CTS (DISPOSABLE) ×2 IMPLANT
CEMENT HV SMART SET (Cement) ×3 IMPLANT
COVER BACK TABLE 60X90IN (DRAPES) ×2 IMPLANT
COVER SURGICAL LIGHT HANDLE (MISCELLANEOUS) ×2 IMPLANT
CUP D38 DXTEND STAND PLUS 6 HU (Orthopedic Implant) ×2 IMPLANT
CUP STD D38 DXTEND PLUS 6 HU (Orthopedic Implant) IMPLANT
DRAPE INCISE IOBAN 66X45 STRL (DRAPES) ×2 IMPLANT
DRAPE ORTHO SPLIT 77X108 STRL (DRAPES) ×2
DRAPE SHEET LG 3/4 BI-LAMINATE (DRAPES) ×2 IMPLANT
DRAPE SURG ORHT 6 SPLT 77X108 (DRAPES) ×2 IMPLANT
DRAPE TOP 10253 STERILE (DRAPES) ×2 IMPLANT
DRAPE U-SHAPE 47X51 STRL (DRAPES) ×3 IMPLANT
DRILL 2.5 (BIT) ×1
DRSG ADAPTIC 3X8 NADH LF (GAUZE/BANDAGES/DRESSINGS) ×2 IMPLANT
DRSG PAD ABDOMINAL 8X10 ST (GAUZE/BANDAGES/DRESSINGS) ×2 IMPLANT
DURAPREP 26ML APPLICATOR (WOUND CARE) ×2 IMPLANT
ELECT BLADE TIP CTD 4 INCH (ELECTRODE) ×2 IMPLANT
ELECT NDL TIP 2.8 STRL (NEEDLE) ×1 IMPLANT
ELECT NEEDLE TIP 2.8 STRL (NEEDLE) ×2 IMPLANT
ELECT REM PT RETURN 15FT ADLT (MISCELLANEOUS) ×2 IMPLANT
FACESHIELD WRAPAROUND (MASK) ×2 IMPLANT
FACESHIELD WRAPAROUND OR TEAM (MASK) ×1 IMPLANT
GAUZE SPONGE 4X4 12PLY STRL (GAUZE/BANDAGES/DRESSINGS) ×2 IMPLANT
GLENOSPHERE DELTA XTEND LAT 38 (Miscellaneous) ×1 IMPLANT
GLOVE SURG ORTHO LTX SZ7.5 (GLOVE) ×3 IMPLANT
GLOVE SURG ORTHO LTX SZ8.5 (GLOVE) ×2 IMPLANT
GLOVE SURG UNDER POLY LF SZ7.5 (GLOVE) ×3 IMPLANT
GLOVE SURG UNDER POLY LF SZ8.5 (GLOVE) ×2 IMPLANT
GOWN STRL REUS W/ TWL XL LVL3 (GOWN DISPOSABLE) ×2 IMPLANT
GOWN STRL REUS W/TWL XL LVL3 (GOWN DISPOSABLE) ×3
KIT BASIN OR (CUSTOM PROCEDURE TRAY) ×2 IMPLANT
KIT TURNOVER KIT A (KITS) ×1 IMPLANT
MANIFOLD NEPTUNE II (INSTRUMENTS) ×2 IMPLANT
METAGLENE DELTA EXTEND (Trauma) IMPLANT
METAGLENE DXTEND (Trauma) ×2 IMPLANT
NDL MAYO CATGUT SZ4 TPR NDL (NEEDLE) IMPLANT
NEEDLE MAYO CATGUT SZ4 (NEEDLE) ×2 IMPLANT
NS IRRIG 1000ML POUR BTL (IV SOLUTION) ×2 IMPLANT
PACK SHOULDER (CUSTOM PROCEDURE TRAY) ×2 IMPLANT
PIN GUIDE 1.2 (PIN) ×1 IMPLANT
PIN GUIDE GLENOPHERE 1.5MX300M (PIN) ×1 IMPLANT
PIN METAGLENE 2.5 (PIN) ×1 IMPLANT
PROTECTOR NERVE ULNAR (MISCELLANEOUS) ×1 IMPLANT
RESTRAINT HEAD UNIVERSAL NS (MISCELLANEOUS) ×2 IMPLANT
SCREW LOCK 42 (Screw) ×1 IMPLANT
SCREW LOCK DELTA XTEND 4.5X30 (Screw) ×1 IMPLANT
SLING ARM FOAM STRAP LRG (SOFTGOODS) IMPLANT
SMARTMIX MINI TOWER (MISCELLANEOUS) ×4
SPIKE FLUID TRANSFER (MISCELLANEOUS) ×2 IMPLANT
SPONGE T-LAP 18X18 ~~LOC~~+RFID (SPONGE) ×3 IMPLANT
SPONGE T-LAP 4X18 ~~LOC~~+RFID (SPONGE) ×2 IMPLANT
STAPLER VISISTAT 35W (STAPLE) ×1 IMPLANT
STEM EPIPHYSIS SHOULDER (Stem) ×1 IMPLANT
STEM HUMERAL SZ8 STANDARD (Stem) ×2 IMPLANT
STEM HUMERAL SZ8 STD (Stem) IMPLANT
STRIP CLOSURE SKIN 1/2X4 (GAUZE/BANDAGES/DRESSINGS) ×2 IMPLANT
SUCTION FRAZIER HANDLE 10FR (MISCELLANEOUS) ×1
SUCTION TUBE FRAZIER 10FR DISP (MISCELLANEOUS) ×1 IMPLANT
SUT FIBERWIRE #2 38 T-5 BLUE (SUTURE) ×10
SUT MNCRL AB 4-0 PS2 18 (SUTURE) ×2 IMPLANT
SUT VIC AB 0 CT1 36 (SUTURE) ×4 IMPLANT
SUT VIC AB 0 CT2 27 (SUTURE) ×2 IMPLANT
SUT VIC AB 2-0 CT1 27 (SUTURE) ×1
SUT VIC AB 2-0 CT1 TAPERPNT 27 (SUTURE) ×1 IMPLANT
SUTURE FIBERWR #2 38 T-5 BLUE (SUTURE) ×2 IMPLANT
TOWEL OR 17X26 10 PK STRL BLUE (TOWEL DISPOSABLE) ×2 IMPLANT
TOWER SMARTMIX MINI (MISCELLANEOUS) IMPLANT
TRAY FOLEY MTR SLVR 14FR STAT (SET/KITS/TRAYS/PACK) ×1 IMPLANT
WATER STERILE IRR 1000ML POUR (IV SOLUTION) ×2 IMPLANT
YANKAUER SUCT BULB TIP NO VENT (SUCTIONS) IMPLANT

## 2021-12-31 NOTE — Progress Notes (Signed)
Assisted Dr. Ermalene Postin with left, interscalene ultrasound guided block. Side rails up, monitors on throughout procedure. See vital signs in flow sheet. Tolerated Procedure well. ? ?

## 2021-12-31 NOTE — Anesthesia Procedure Notes (Signed)
Anesthesia Regional Block: Interscalene brachial plexus block  ? ?Pre-Anesthetic Checklist: , timeout performed,  Correct Patient, Correct Site, Correct Laterality,  Correct Procedure, Correct Position, site marked,  Risks and benefits discussed,  Surgical consent,  Pre-op evaluation,  At surgeon's request and post-op pain management ? ?Laterality: Left and Upper ? ?Prep: chloraprep     ?  ?Needles:  ?Injection technique: Single-shot ? ?  ? ? ?Needle Length: 5cm  ?Needle Gauge: 22  ? ? ? ?Additional Needles: ?Arrow? StimuQuik? ECHO Echogenic Stimulating PNB Needle ? ?Procedures:,,,, ultrasound used (permanent image in chart),,    ?Narrative:  ?Start time: 12/31/2021 3:09 PM ?End time: 12/31/2021 3:15 PM ?Injection made incrementally with aspirations every 5 mL. ? ?Performed by: Personally  ?Anesthesiologist: Oleta Mouse, MD ? ? ? ? ?

## 2021-12-31 NOTE — Anesthesia Procedure Notes (Signed)
Procedure Name: Intubation ?Date/Time: 12/31/2021 3:42 PM ?Performed by: Montel Clock, CRNA ?Pre-anesthesia Checklist: Patient identified, Emergency Drugs available, Suction available, Patient being monitored and Timeout performed ?Patient Re-evaluated:Patient Re-evaluated prior to induction ?Oxygen Delivery Method: Circle system utilized ?Preoxygenation: Pre-oxygenation with 100% oxygen ?Induction Type: IV induction ?Ventilation: Mask ventilation without difficulty ?Laryngoscope Size: Mac and 3 ?Grade View: Grade I ?Tube type: Oral ?Tube size: 7.0 mm ?Number of attempts: 1 ?Airway Equipment and Method: Stylet ?Placement Confirmation: ETT inserted through vocal cords under direct vision, positive ETCO2 and breath sounds checked- equal and bilateral ?Secured at: 21 cm ?Tube secured with: Tape ?Dental Injury: Teeth and Oropharynx as per pre-operative assessment  ? ? ? ? ?

## 2021-12-31 NOTE — Discharge Instructions (Signed)
Ice to the shoulder constantly.  Keep the incision covered and clean and dry for one week, then ok to get it wet in the shower. ? ?Do gentle exercise as instructed several times per day. ? ?DO NOT reach behind your back or push up out of a chair with the operative arm. BE VERY CAREFUL with the shoulder please ? ?Use a sling while you are up and around for comfort, may remove while seated.  Keep pillow propped behind the operative elbow. ? ?Follow up with Dr Veverly Fells in two weeks in the office, call [phone] for appt  ?

## 2021-12-31 NOTE — Brief Op Note (Signed)
12/31/2021 ? ?6:24 PM ? ?PATIENT:  Alicia Crawford  66 y.o. female ? ?PRE-OPERATIVE DIAGNOSIS:  left proximal humerus fracture, displaced and comminuted ? ?POST-OPERATIVE DIAGNOSIS:  left proximal humerus fracture, displaced and comminuted ? ?PROCEDURE:  Procedure(s): ?REVERSE SHOULDER ARTHROPLASTY (Left) DePuy Delta Xtend  with tuberosity repair ? ?SURGEON:  Surgeon(s) and Role: ?   Netta Cedars, MD - Primary ? ?PHYSICIAN ASSISTANT:  ? ?ASSISTANTS: Ventura Bruns, PA-C  ? ?ANESTHESIA:   regional and general ? ?EBL:  500 mL  ? ?BLOOD ADMINISTERED:none ? ?DRAINS: none  ? ?LOCAL MEDICATIONS USED:  [place]    ? ?SPECIMEN:  No Specimen ? ?DISPOSITION OF SPECIMEN:  N/A ? ?COUNTS:  YES ? ?TOURNIQUET:  * No tourniquets in log * ? ?DICTATION: .Other Dictation: Dictation Number [id number] ? ?PLAN OF CARE: Admit for overnight observation ? ?PATIENT DISPOSITION:  PACU - hemodynamically stable. ?  ?Delay start of Pharmacological VTE agent (>24hrs) due to surgical blood loss or risk of bleeding: not applicable ? ?

## 2021-12-31 NOTE — Interval H&P Note (Signed)
History and Physical Interval Note: ? ?12/31/2021 ?3:12 PM ? ?Alicia Crawford  has presented today for surgery, with the diagnosis of left proximal humerus fracture.  The various methods of treatment have been discussed with the patient and family. After consideration of risks, benefits and other options for treatment, the patient has consented to  Procedure(s): ?REVERSE SHOULDER ARTHROPLASTY (Left) as a surgical intervention.  The patient's history has been reviewed, patient examined, no change in status, stable for surgery.  I have reviewed the patient's chart and labs.  Questions were answered to the patient's satisfaction.   ? ? ?Augustin Schooling ? ? ?

## 2021-12-31 NOTE — Op Note (Signed)
NAME: Alicia Crawford, Alicia Crawford. ?MEDICAL RECORD NO: [id number] ?ACCOUNT NO: [id number] ?DATE OF BIRTH: 1956/09/15 ?FACILITY: [facility] ?LOCATION: [facility] ?PHYSICIAN: Doran Heater. Veverly Fells, MD ? ?Operative Report  ? ?DATE OF PROCEDURE: 12/31/2021 ? ?PREOPERATIVE DIAGNOSIS:  Left displaced and comminuted proximal humerus fracture. ? ?POSTOPERATIVE DIAGNOSIS:  Left displaced and comminuted proximal humerus fracture. ? ?PROCEDURE PERFORMED:  Left reverse total shoulder arthroplasty using DePuy Delta Xtend prosthesis with tuberosity repair. ? ?ATTENDING SURGEON:  Doran Heater. Veverly Fells, MD ? ?ASSISTANT:  Darol Destine, [place], who was scrubbed during the entire procedure and necessary for satisfactory completion of the surgery. ? ?ANESTHESIA:  General anesthesia was used plus interscalene block. ? ?ESTIMATED BLOOD LOSS:  500 mL. ? ?FLUID REPLACEMENT:  1500 mL crystalloid. ? ?Instrument counts correct.  There were no complications. Perioperative antibiotics were given. ? ?INDICATIONS:  The patient is a 66 year old female with a history of a fall injuring her left shoulder.  The patient presented to the orthopedic clinic with a displaced and comminuted proximal humerus fracture.  The patient due to extreme displacement of  ?her fracture fragments and high likelihood of nonunion with an ORIF, we elected to proceed with reverse shoulder replacement to reestablish fixed fulcrum mechanics in the shoulder and to provide pain relief.  Informed consent obtained. ? ?DESCRIPTION OF PROCEDURE:  After an adequate level of anesthesia was achieved, the patient was positioned in modified beach chair position.  Left shoulder correctly identified and sterilely prepped and draped in the usual manner.  Timeout called,  ?verifying correct patient, correct site. We entered the patient's shoulder using a standard deltopectoral approach, starting at the coracoid process extending down the anterior humerus.  Dissection down through subcutaneous tissues using Bovie.  We   ?identified the cephalic vein and took that laterally with the deltoid, pectoralis taken medially.  Conjoined tendon identified and retracted medially.  We tenodesed the biceps in situ with 0 Vicryl figure-of-eight suture. Deep retractors placed.  We then ? identified the fractured shoulder.  We osteotomized the lesser tuberosity and greater tuberosity free from the head and then removed the head, which was fairly thinned and damaged from the shaft that had been digging into it.  Once we had her  ?tuberosities debulked, we placed #2 FiberWire suture medial to the lesser tuberosity and lateral to the greater tuberosity with large mattress bites into the tendon and then coming out over the superficial portion of the bone.  Next, we took the humeral  ?head and used that for bone graft on the back table.  We then subluxed the humeral fragments out of the way, so that we had good access to the glenoid face.  We removed the glenoid labrum, the cartilage on the glenoid face and then went ahead and placed  ?our deep retractors.  We placed our guide pin centered on the glenoid face.  We reamed for the metaglene baseplate.  We did our peripheral hand reaming and then did our central drill hole, with the large drill over the guide pin.  Next, we removed the  ?guide pin.  We then impacted the metaglene baseplate into position.  We placed a 42 screw inferiorly and a 30 at the base of the coracoid.  Due to the small size of the patient's glenoid, we can only do two screws.  We then placed a 38 standard  ?glenosphere onto the baseplate and secured that with a screwdriver.  I did a finger sweep to make sure we had no soft tissue caught up in  that bearing.  Next, we went to the humeral side.  We reamed up to a size 8, we could not get the 10 down.  We went  ?ahead and did the 8 stem, with the fracture epi set on the 0 setting and we placed that in approximately 10 degrees of retroversion.  We used a 38+3 trial and found the  appropriate height for our stem that was going to be providing the best stability.   ?We removed the trial components.  At this point, we went ahead and placed an around-the-world stitch through the real implant.  We selected the real Por[place]oat size 8 stem and the  fracture epi and once we had that secured at the 0 position, we put  ?around-the-world stitch through the center of the implant.  We then went ahead and mixed high viscosity cement on the back table and then we cemented the components into place.  We had the appropriate height and version.  We allowed the cement to set.   ?At this point, we went ahead and trialed with a +3 again and then selected the 38+6 poly for the best fit and best stability.  We reduced the shoulder.  We then went ahead and irrigated thoroughly.  We used the Bovie electrocautery for hemostasis.  We  ?then used available bone graft and extensively bone grafted up around the implant and then placed our around-the-world stitch first medial to the lesser tuberosity and lateral to the greater tuberosity.  We then put the bone graft down on the stem.  We  ?then tied our tuberosity sutures to each other.  We then tied a couple of rotator interval sutures and then did sutures through the stem and also through the tuberosities to control the tuberosities relative to the humerus and then also the  ?around-the-world stitch was tied and compressed the last, we had a really good repair that was not restricting range of motion at all and had the tuberosities in the appropriate position at the appropriate height.  Once we had the tuberosity repair  ?completed, we irrigated again and then repaired the deltopectoral interval with 0 Vicryl suture followed by 2-0 Vicryl for subcutaneous closure and staples for skin.  Sterile dressing applied.  The patient transported to the recovery room in stable  ?condition. ? ? ?SHW ?D: 12/31/2021 6:32:24 pm T: 12/31/2021 11:09:00 pm  ?JOB: [id number]/ [id number]  ?

## 2021-12-31 NOTE — Transfer of Care (Signed)
Immediate Anesthesia Transfer of Care Note ? ?Patient: Alicia Crawford ? ?Procedure(s) Performed: REVERSE SHOULDER ARTHROPLASTY (Left: Shoulder) ? ?Patient Location: PACU ? ?Anesthesia Type:General and Regional ? ?Level of Consciousness: drowsy ? ?Airway & Oxygen Therapy: Patient Spontanous Breathing and Patient connected to face mask ? ?Post-op Assessment: Report given to RN and Post -op Vital signs reviewed and stable ? ?Post vital signs: Reviewed and stable ? ?Last Vitals:  ?Vitals Value Taken Time  ?BP 107/70 12/31/21 1843  ?Temp    ?Pulse 98 12/31/21 1846  ?Resp 20 12/31/21 1846  ?SpO2 96 % 12/31/21 1846  ?Vitals shown include unvalidated device data. ? ?Last Pain:  ?Vitals:  ? 12/31/21 1351  ?TempSrc: Oral  ?   ? ?  ? ?Complications: No notable events documented. ?

## 2021-12-31 NOTE — Anesthesia Preprocedure Evaluation (Signed)
Anesthesia Evaluation  ?Patient identified by MRN, date of birth, ID band ?Patient awake ? ? ? ?Reviewed: ?Allergy & Precautions, NPO status , Patient's Chart, lab work & pertinent test results, reviewed documented beta blocker date and time  ? ?History of Anesthesia Complications ?(+) history of anesthetic complications ? ?Airway ?Mallampati: III ? ?TM Distance: >3 FB ?Neck ROM: Full ? ? ? Dental ? ?(+) Teeth Intact, Dental Advisory Given ?  ?Pulmonary ?neg shortness of breath, sleep apnea , neg recent URI, former smoker,  ?  ?breath sounds clear to auscultation ? ? ? ? ? ? Cardiovascular ?hypertension, Pt. on medications and Pt. on home beta blockers ?(-) angina+ CAD, + Past MI, + CABG and + Peripheral Vascular Disease  ? ?Rhythm:Regular  ?EF 50-55% 2012 ? ?Neg stress 2014 ?  ?Neuro/Psych ?  ? GI/Hepatic ?Neg liver ROS, GERD  Medicated,  ?Endo/Other  ?diabetesLab Results ?     Component                Value               Date                 ?     HGBA1C                   6.1 (H)             03/06/2021           ? ? Renal/GU ?ARFRenal diseaseLab Results ?     Component                Value               Date                 ?     CREATININE               2.25 (H)            12/31/2021           ?  ? ?  ?Musculoskeletal ? ? Abdominal ?  ?Peds ? Hematology ? ?(+) Blood dyscrasia, anemia , Lab Results ?     Component                Value               Date                 ?     WBC                      10.2                12/31/2021           ?     HGB                      9.1 (L)             12/31/2021           ?     HCT                      28.7 (L)            12/31/2021           ?     MCV  94.7                12/31/2021           ?     PLT                      280                 12/31/2021           ?   ?Anesthesia Other Findings ? ? Reproductive/Obstetrics ? ?  ? ? ? ? ? ? ? ? ? ? ? ? ? ?  ?  ? ? ? ? ? ? ? ? ?Anesthesia Physical ?Anesthesia Plan ? ?ASA:  3 ? ?Anesthesia Plan: General and Regional  ? ?Post-op Pain Management: Regional block* and Ofirmev IV (intra-op)*  ? ?Induction: Intravenous ? ?PONV Risk Score and Plan: 3 and Ondansetron and Dexamethasone ? ?Airway Management Planned: Oral ETT ? ?Additional Equipment: None ? ?Intra-op Plan:  ? ?Post-operative Plan: Extubation in OR ? ?Informed Consent: I have reviewed the patients History and Physical, chart, labs and discussed the procedure including the risks, benefits and alternatives for the proposed anesthesia with the patient or authorized representative who has indicated his/her understanding and acceptance.  ? ? ? ?Dental advisory given ? ?Plan Discussed with: CRNA and Surgeon ? ?Anesthesia Plan Comments:   ? ? ? ? ? ? ?Anesthesia Quick Evaluation ? ?

## 2022-01-01 DIAGNOSIS — E1122 Type 2 diabetes mellitus with diabetic chronic kidney disease: Secondary | ICD-10-CM | POA: Diagnosis present

## 2022-01-01 DIAGNOSIS — Z888 Allergy status to other drugs, medicaments and biological substances status: Secondary | ICD-10-CM | POA: Diagnosis not present

## 2022-01-01 DIAGNOSIS — K219 Gastro-esophageal reflux disease without esophagitis: Secondary | ICD-10-CM | POA: Diagnosis present

## 2022-01-01 DIAGNOSIS — E1151 Type 2 diabetes mellitus with diabetic peripheral angiopathy without gangrene: Secondary | ICD-10-CM | POA: Diagnosis present

## 2022-01-01 DIAGNOSIS — S4292XD Fracture of left shoulder girdle, part unspecified, subsequent encounter for fracture with routine healing: Secondary | ICD-10-CM | POA: Diagnosis not present

## 2022-01-01 DIAGNOSIS — Y92019 Unspecified place in single-family (private) house as the place of occurrence of the external cause: Secondary | ICD-10-CM | POA: Diagnosis not present

## 2022-01-01 DIAGNOSIS — I6529 Occlusion and stenosis of unspecified carotid artery: Secondary | ICD-10-CM | POA: Diagnosis present

## 2022-01-01 DIAGNOSIS — F0390 Unspecified dementia without behavioral disturbance: Secondary | ICD-10-CM | POA: Diagnosis present

## 2022-01-01 DIAGNOSIS — Z951 Presence of aortocoronary bypass graft: Secondary | ICD-10-CM | POA: Diagnosis not present

## 2022-01-01 DIAGNOSIS — S42202A Unspecified fracture of upper end of left humerus, initial encounter for closed fracture: Secondary | ICD-10-CM | POA: Diagnosis present

## 2022-01-01 DIAGNOSIS — Z79899 Other long term (current) drug therapy: Secondary | ICD-10-CM | POA: Diagnosis not present

## 2022-01-01 DIAGNOSIS — Z6829 Body mass index (BMI) 29.0-29.9, adult: Secondary | ICD-10-CM | POA: Diagnosis not present

## 2022-01-01 DIAGNOSIS — I10 Essential (primary) hypertension: Secondary | ICD-10-CM | POA: Diagnosis present

## 2022-01-01 DIAGNOSIS — I252 Old myocardial infarction: Secondary | ICD-10-CM | POA: Diagnosis not present

## 2022-01-01 DIAGNOSIS — Z8673 Personal history of transient ischemic attack (TIA), and cerebral infarction without residual deficits: Secondary | ICD-10-CM | POA: Diagnosis not present

## 2022-01-01 DIAGNOSIS — Z8674 Personal history of sudden cardiac arrest: Secondary | ICD-10-CM | POA: Diagnosis not present

## 2022-01-01 DIAGNOSIS — E669 Obesity, unspecified: Secondary | ICD-10-CM | POA: Diagnosis present

## 2022-01-01 DIAGNOSIS — F419 Anxiety disorder, unspecified: Secondary | ICD-10-CM | POA: Diagnosis present

## 2022-01-01 DIAGNOSIS — G4733 Obstructive sleep apnea (adult) (pediatric): Secondary | ICD-10-CM | POA: Diagnosis present

## 2022-01-01 DIAGNOSIS — N189 Chronic kidney disease, unspecified: Secondary | ICD-10-CM | POA: Diagnosis present

## 2022-01-01 DIAGNOSIS — Z7984 Long term (current) use of oral hypoglycemic drugs: Secondary | ICD-10-CM | POA: Diagnosis not present

## 2022-01-01 DIAGNOSIS — W19XXXD Unspecified fall, subsequent encounter: Secondary | ICD-10-CM | POA: Diagnosis not present

## 2022-01-01 DIAGNOSIS — I2510 Atherosclerotic heart disease of native coronary artery without angina pectoris: Secondary | ICD-10-CM | POA: Diagnosis present

## 2022-01-01 DIAGNOSIS — E7800 Pure hypercholesterolemia, unspecified: Secondary | ICD-10-CM | POA: Diagnosis present

## 2022-01-01 DIAGNOSIS — W541XXA Struck by dog, initial encounter: Secondary | ICD-10-CM | POA: Diagnosis not present

## 2022-01-01 DIAGNOSIS — Z7982 Long term (current) use of aspirin: Secondary | ICD-10-CM | POA: Diagnosis not present

## 2022-01-01 DIAGNOSIS — S4992XA Unspecified injury of left shoulder and upper arm, initial encounter: Secondary | ICD-10-CM | POA: Diagnosis present

## 2022-01-01 DIAGNOSIS — Z87891 Personal history of nicotine dependence: Secondary | ICD-10-CM | POA: Diagnosis not present

## 2022-01-01 LAB — BASIC METABOLIC PANEL
Anion gap: 5 (ref 5–15)
BUN: 38 mg/dL — ABNORMAL HIGH (ref 8–23)
CO2: 22 mmol/L (ref 22–32)
Calcium: 8 mg/dL — ABNORMAL LOW (ref 8.9–10.3)
Chloride: 111 mmol/L (ref 98–111)
Creatinine, Ser: 1.41 mg/dL — ABNORMAL HIGH (ref 0.44–1.00)
GFR, Estimated: 41 mL/min — ABNORMAL LOW (ref >60)
Glucose, Bld: 118 mg/dL — ABNORMAL HIGH (ref 70–99)
Potassium: 4 mmol/L (ref 3.5–5.1)
Sodium: 138 mmol/L (ref 135–145)

## 2022-01-01 LAB — GLUCOSE, CAPILLARY
Glucose-Capillary: 97 mg/dL (ref 70–99)
Glucose-Capillary: 117 mg/dL — ABNORMAL HIGH (ref 70–99)
Glucose-Capillary: 142 mg/dL — ABNORMAL HIGH (ref 70–99)
Glucose-Capillary: 135 mg/dL — ABNORMAL HIGH (ref 70–99)

## 2022-01-01 LAB — HEMOGLOBIN AND HEMATOCRIT, BLOOD
HCT: 18.2 % — ABNORMAL LOW (ref 36.0–46.0)
HCT: 19.9 % — ABNORMAL LOW (ref 36.0–46.0)
HCT: 25.6 % — ABNORMAL LOW (ref 36.0–46.0)
Hemoglobin: 5.9 g/dL — CL (ref 12.0–15.0)
Hemoglobin: 5.9 g/dL — CL (ref 12.0–15.0)
Hemoglobin: 8.6 g/dL — ABNORMAL LOW (ref 12.0–15.0)

## 2022-01-01 LAB — PREPARE RBC (CROSSMATCH)

## 2022-01-01 MED ORDER — ACETAMINOPHEN 325 MG PO TABS
650.0000 mg | ORAL_TABLET | Freq: Once | ORAL | Status: DC
  Filled 2022-01-01: qty 2

## 2022-01-01 MED ORDER — SODIUM CHLORIDE 0.9% IV SOLUTION: Freq: Once | INTRAVENOUS | Status: AC

## 2022-01-01 NOTE — Progress Notes (Addendum)
1015 Started 1unit PRBC transfusion, VS taken and recorded per protocol. No adverse reactions noted, Pt resting comfortably in bed, denies any pain 0/10. ? ?1245 Pt completed 1unit PRBC transfusion. Pt tolerated well, no adverse reactions noted. ? ?1350 Started 2nd unit of PRBC transfusion, no adverse reactions noted. Pt in bed, no pain 0/10. Family at bedside updated. ?

## 2022-01-01 NOTE — Progress Notes (Signed)
OT Cancellation Note ? ?Patient Details ?Name: Alicia Crawford ?MRN: [id number] ?DOB: Jan 31, 1956 ? ? ?Cancelled Treatment:    Reason Eval/Treat Not Completed: Medical issues which prohibited therapy ?Patient noted to have low HgB 5.9 this AM with Ortho MD requesting holding therapy in note this AM. OT to continue to follow and check back when patient is more medically stable.  ? ?Ori Kreiter OTR/L, MS ?Acute Rehabilitation Department ?Office# [phone] ?Pager# [phone] ? ? ?01/01/2022, 10:09 AM ?

## 2022-01-01 NOTE — Progress Notes (Signed)
Orthopedics Progress Note ? ?Subjective: ?Patient is light headed this AM. Pain under control ? ?Objective: ? ?Vitals:  ? 01/01/22 0746 01/01/22 0747  ?BP:    ?Pulse:    ?Resp:    ?Temp:    ?SpO2: (!) 86% 96%  ? ? ?General: Awake and alert  ?Musculoskeletal: Left shoulder dressing CDI, NVI distally ?Neurovascularly intact ? ?Lab Results  ?Component Value Date  ? WBC 10.2 12/31/2021  ? HGB 5.9 (LL) 01/01/2022  ? HCT 19.9 (L) 01/01/2022  ? MCV 94.7 12/31/2021  ? PLT 280 12/31/2021  ? ? ?   ?Component Value Date/Time  ? NA 138 01/01/2022 0317  ? K 4.0 01/01/2022 0317  ? CL 111 01/01/2022 0317  ? CO2 22 01/01/2022 0317  ? GLUCOSE 118 (H) 01/01/2022 0317  ? BUN 38 (H) 01/01/2022 0317  ? CREATININE 1.41 (H) 01/01/2022 0317  ? CALCIUM 8.0 (L) 01/01/2022 0317  ? GFRNONAA 41 (L) 01/01/2022 0317  ? GFRAA 60 (L) 04/22/2019 1227  ? ? ?Lab Results  ?Component Value Date  ? INR 0.98 02/25/2012  ? ? ?Assessment/Plan: ?POD #1 s/p Procedure(s): ?REVERSE SHOULDER ARTHROPLASTY ?Acute blood loss anemia.  ?Hold PT, maintain O2 ?Transfuse 2 Units PRBCs each over 2 hours.  ?Will hold patient another night for close observation and therapy likely tomorrow ? ?Doran Heater. Veverly Fells, MD ?01/01/2022 ?9:09 AM ? ?  ?

## 2022-01-01 NOTE — Progress Notes (Signed)
PT Cancellation Note ? ?Patient Details ?Name: Alicia Crawford ?MRN: [id number] ?DOB: 08/15/1956 ? ? ?Cancelled Treatment:    Reason Eval/Treat Not Completed: Patient not medically ready;Other (comment). Hold PT per MD notes. Hgb 5.9 this am, to have transfusion this am.  ? ? ? ?[name] ?01/01/2022, 10:13 AM ?

## 2022-01-01 NOTE — Progress Notes (Signed)
Labs called for Critical value of Hgb 5.9. On call notified. Received verbal order to recheck Hgb. Pt was asymptomatic, resting comfortably on bed. Will continue to monitor pt. ?

## 2022-01-02 LAB — BPAM RBC
Blood Product Expiration Date: 202304262359
Blood Product Expiration Date: 202304262359
ISSUE DATE / TIME: 202304010956
ISSUE DATE / TIME: 202304011338
Unit Type and Rh: 5100
Unit Type and Rh: 5100

## 2022-01-02 LAB — CBC
HCT: 28.5 % — ABNORMAL LOW (ref 36.0–46.0)
Hemoglobin: 9.5 g/dL — ABNORMAL LOW (ref 12.0–15.0)
MCH: 30.8 pg (ref 26.0–34.0)
MCHC: 33.3 g/dL (ref 30.0–36.0)
MCV: 92.5 fL (ref 80.0–100.0)
Platelets: 193 10*3/uL (ref 150–400)
RBC: 3.08 MIL/uL — ABNORMAL LOW (ref 3.87–5.11)
RDW: 14.8 % (ref 11.5–15.5)
WBC: 8.8 10*3/uL (ref 4.0–10.5)
nRBC: 0 % (ref 0.0–0.2)

## 2022-01-02 LAB — TYPE AND SCREEN
ABO/RH(D): O POS
Antibody Screen: NEGATIVE
Unit division: 0
Unit division: 0

## 2022-01-02 LAB — BASIC METABOLIC PANEL
Anion gap: 10 (ref 5–15)
BUN: 25 mg/dL — ABNORMAL HIGH (ref 8–23)
CO2: 22 mmol/L (ref 22–32)
Calcium: 8.5 mg/dL — ABNORMAL LOW (ref 8.9–10.3)
Chloride: 105 mmol/L (ref 98–111)
Creatinine, Ser: 1 mg/dL (ref 0.44–1.00)
GFR, Estimated: >60 mL/min (ref >60)
Glucose, Bld: 112 mg/dL — ABNORMAL HIGH (ref 70–99)
Potassium: 3.6 mmol/L (ref 3.5–5.1)
Sodium: 137 mmol/L (ref 135–145)

## 2022-01-02 LAB — GLUCOSE, CAPILLARY
Glucose-Capillary: 120 mg/dL — ABNORMAL HIGH (ref 70–99)
Glucose-Capillary: 143 mg/dL — ABNORMAL HIGH (ref 70–99)
Glucose-Capillary: 102 mg/dL — ABNORMAL HIGH (ref 70–99)
Glucose-Capillary: 134 mg/dL — ABNORMAL HIGH (ref 70–99)

## 2022-01-02 NOTE — Plan of Care (Signed)
  Problem: Coping: Goal: Level of anxiety will decrease Outcome: Progressing   Problem: Pain Managment: Goal: General experience of comfort will improve Outcome: Progressing   

## 2022-01-02 NOTE — Progress Notes (Signed)
? ?Subjective: ?2 Days Post-Op Procedure(s) (LRB): ?REVERSE SHOULDER ARTHROPLASTY (Left) ?Patient reports pain as moderate.   ?Patient seen in rounds for Dr. Veverly Fells ?Patient is well, and has had no acute complaints or problems. No acute events overnight. She reports pain in the shoulder, particularly with movement. Denies abdominal pain, N/V. Voiding without difficulty. ?We will start therapy today.  ? ?Objective: ?Vital signs in last 24 hours: ?Temp:  [97.9 ?F (36.6 ?C)-99.9 ?F (37.7 ?C)] 97.9 ?F (36.6 ?C) (04/02 0531) ?Pulse Rate:  [87-105] 95 (04/02 0531) ?Resp:  [14-18] 18 (04/02 0531) ?BP: (96-141)/(49-64) 129/64 (04/02 0531) ?SpO2:  [91 %-95 %] 91 % (04/02 0531) ?FiO2 (%):  [2 %] 2 % (04/01 2053) ? ?Intake/Output from previous day: ? ?Intake/Output Summary (Last 24 hours) at 01/02/2022 0845 ?Last data filed at 01/02/2022 0600 ?Gross per 24 hour  ?Intake 2048.17 ml  ?Output 1750 ml  ?Net 298.17 ml  ?  ? ?Intake/Output this shift: ?No intake/output data recorded. ? ?Labs: ?Recent Labs  ?  12/31/21 ?1329 01/01/22 ?0160 01/01/22 ?1093 01/01/22 ?1854  ?HGB 9.1* 5.9* 5.9* 8.6*  ? ?Recent Labs  ?  12/31/21 ?1329 01/01/22 ?2355 01/01/22 ?7322 01/01/22 ?1854  ?WBC 10.2  --   --   --   ?RBC 3.03*  --   --   --   ?HCT 28.7*   < > 19.9* 25.6*  ?PLT 280  --   --   --   ? < > = values in this interval not displayed.  ? ?Recent Labs  ?  12/31/21 ?1329 01/01/22 ?0254  ?NA 133* 138  ?K 4.2 4.0  ?CL 105 111  ?CO2 16* 22  ?BUN 49* 38*  ?CREATININE 2.25* 1.41*  ?GLUCOSE 137* 118*  ?CALCIUM 9.2 8.0*  ? ?No results for input(s): LABPT, INR in the last 72 hours. ? ?Exam: ?General - Patient is Alert and Oriented ?Extremity -  ?LUE: ?In sling. She has full grip strength. Sensation not fully returned to the thumb, but block is wearing off. Bulky dressing left in place.  ? ?Dressing - dressing C/D/I ?Motor Function - intact, moving fingers well on exam.  ? ?Past Medical History:  ?Diagnosis Date  ? Ankle fracture 09/05/2016  ? right ankle   ? Anoxic brain injury ([place]) 08/2010  ? "w/cardiac arrest"  ? Anxiety Jan. 2012  ? Blood transfusion 09/2010  ? Cardiac arrest ([place]) 08/04/2010  ? Carotid artery occlusion   ? mild to moderate bilateral internal carotid artery stenosis  ? Claudication [facility])   ? Complication of anesthesia   ? [place]  ? Coronary artery disease   ? CABG 2011 in [place]  ? Coronary artery disease   ? Dementia   ? Depression   ? DVT (deep venous thrombosis) ([place])   ? GERD (gastroesophageal reflux disease)   ? High cholesterol   ? Hypertension   ? Myocardial infarction ([place]) 09/27/10  ? in [place]  ? Obesity   ? Peripheral vascular disease ([place])   ? bilat SFA diseae  ? Renal artery stenosis ([place])   ? Rotator cuff tendonitis   ? Sleep apnea   ? uses CPAP nightly  ? Stroke [facility]) 2011  ? Subdural hematoma May 25,2013  ? Frontal subdural  S/P evacuation of hematoma  ? Type II diabetes mellitus ([place])   ? Diet and Exercise  ? ? ?Assessment/Plan: ?2 Days Post-Op Procedure(s) (LRB): ?REVERSE SHOULDER ARTHROPLASTY (Left) ?Principal Problem: ?  H/O total shoulder replacement, left ? ?  Estimated body mass index is 29.18 kg/m? as calculated from the following: ?  Height as of this encounter: '5\' 4"'$  (1.626 m). ?  Weight as of this encounter: 77.1 kg. ?Advance diet ?Up with therapy ? ?  ? ?DVT Prophylaxis - Aspirin ?Weight bearing as tolerated. ? ?ABLA: Hgb improved to 8.6 last night s/p 2 units PRBC.  ?Hx of CKD (baseline seems to be around 1.2), elevated to 2.25 on 3/31, down to 1.41 yesterday.  ?Will recheck labs today. ? ?Plan is to go Home after hospital stay. Plan to get up with PT/OT today. Patient will likely spend the night again tonight so that we can continue to monitor and improve with PT.  ? ? ?Griffith Citron, PA-C ?Orthopedic Surgery ?[phone] ?01/02/2022, 8:45 AM  ?

## 2022-01-02 NOTE — Evaluation (Signed)
Occupational Therapy Evaluation ?Patient Details ?Name: Alicia Crawford ?MRN: [id number] ?DOB: [birth year] ?Today's Date: 01/02/2022 ? ? ?History of Present Illness 66 y.o. female complaining of left shoulder pain after recent fall,  X-rays show proximal, displaced, comminuted L  humerus fracture. pt is s/p L rTSA on 12/31/21. PMH: anoxic braininjury, subdural hematoma, dementia, R ankle fx, spine surgery/PLIF, panniculectomy, HTN, DM, CVA  ? ?Clinical Impression ?  ?Patient is a  66 year old female who was admitted for above. Patient was living at home with family prior level. Patient plans to transition home with family support at time of d/c. Patient was educated on shoulder precautions, dressing and bathing with precautions, proper positioning in bed/recliner, and proper positioning of sling. Patient verbalized and demonstrated understanding. Patient reported daughter would be present tomorrow morning. Plan to teach patient and daughter tomorrow AM to ensure carryover of ortho recommendations at time of d/c. Patient would continue to benefit from skilled OT services at this time while admitted and after d/c to address noted deficits in order to improve overall safety and independence in ADLs.  ? ?   ? ?Recommendations for follow up therapy are one component of a multi-disciplinary discharge planning process, led by the attending physician.  Recommendations may be updated based on patient status, additional functional criteria and insurance authorization.  ? ?Follow Up Recommendations ? Follow physician's recommendations for discharge plan and follow up therapies  ?  ?Assistance Recommended at Discharge Frequent or constant Supervision/Assistance  ?Patient can return home with the following A little help with walking and/or transfers;A lot of help with bathing/dressing/bathroom;Assistance with cooking/housework;Direct supervision/assist for medications management;Assist for transportation;Help with stairs or ramp for  entrance;Direct supervision/assist for financial management ? ?  ?Functional Status Assessment ? Patient has had a recent decline in their functional status and demonstrates the ability to make significant improvements in function in a reasonable and predictable amount of time.  ?Equipment Recommendations ? None recommended by OT  ?  ?Recommendations for Other Services   ? ? ?  ?Precautions / Restrictions Precautions ?Precautions: Fall;Shoulder ?Type of Shoulder Precautions: NO ROM shoulder, ok for hand wrist and elbow ROM ?Precaution Booklet Issued: Yes (comment) (handout) ?Required Braces or Orthoses: Sling ?Restrictions ?Weight Bearing Restrictions: Yes ?LUE Weight Bearing: Non weight bearing  ? ?  ? ?Mobility Bed Mobility ?  ?  ?  ?  ?  ?  ?  ?General bed mobility comments: in recliner on arrival ?  ? ?Transfers ?  ?  ?  ?  ?  ?  ?  ?  ?  ?  ?  ? ?  ?Balance Overall balance assessment: Needs assistance, History of Falls ?Sitting-balance support: Feet supported, No upper extremity supported ?Sitting balance-Leahy Scale: Good ?  ?  ?Standing balance support: During functional activity ?Standing balance-Leahy Scale: Fair ?Standing balance comment: brushing teeth at sink ?  ?  ?  ?  ?  ?  ?  ?  ?  ?  ?  ?   ? ?ADL either performed or assessed with clinical judgement  ? ?ADL Overall ADL's : Needs assistance/impaired ?Eating/Feeding: Set up;Sitting ?  ?Grooming: Wash/dry face;Standing;Supervision/safety;Oral care ?Grooming Details (indicate cue type and reason): at sink ?Upper Body Bathing: Moderate assistance;Sitting ?Upper Body Bathing Details (indicate cue type and reason): education on see saw technique ?Lower Body Bathing: Maximal assistance;Sit to/from stand;Sitting/lateral leans ?  ?Upper Body Dressing : Maximal assistance;Sitting;Cueing for UE precautions ?  ?Lower Body Dressing: Maximal assistance;Sit to/from stand ?  ?  Toilet Transfer: Nature conservation officer;Ambulation ?Toilet Transfer Details (indicate cue  type and reason): no AD noted to have to lift RLE higher than L with reports she used to wear braces on legs after heart attack ?Toileting- Clothing Manipulation and Hygiene: Moderate assistance;Sit to/from stand ?  ?  ?  ?Functional mobility during ADLs: Min guard ?   ? ? ? ?Vision   ?   ?   ?Perception   ?  ?Praxis   ?  ? ?Pertinent Vitals/Pain Pain Assessment ?Pain Assessment: Faces ?Faces Pain Scale: Hurts a little bit ?Pain Location: L shoulder ?Pain Descriptors / Indicators: Discomfort ?Pain Intervention(s): Monitored during session  ? ? ? ?Hand Dominance Right ?  ?Extremity/Trunk Assessment Upper Extremity Assessment ?Upper Extremity Assessment: Defer to OT evaluation ?LUE Deficits / Details: L TSA with no ROM and NWB restrictions in place. can wiggle fingers report thumb is still alittle numb ?  ?Lower Extremity Assessment ?Lower Extremity Assessment: Overall WFL for tasks assessed;RLE deficits/detail ?RLE Deficits / Details: df 2/5, otherwise grossly WFL ?  ?  ?  ?Communication Communication ?Communication: No difficulties ?  ?Cognition Arousal/Alertness: Awake/alert ?Behavior During Therapy: [facility] for tasks assessed/performed ?Overall Cognitive Status: Within Functional Limits for tasks assessed ?  ?  ?  ?  ?  ?  ?  ?  ?  ?  ?  ?  ?  ?  ?  ?  ?General Comments: patient is plesant, "friday or saturday", 2023, April, ?  ?  ?General Comments    ? ?  ?Exercises   ?  ?Shoulder Instructions    ? ? ?Home Living Family/patient expects to be discharged to:: Private residence ?Living Arrangements: Spouse/significant other ?Available Help at Discharge: Family;Available 24 hours/day ?Type of Home: House ?Home Access: Stairs to enter ?Entrance Stairs-Number of Steps: 3 ?Entrance Stairs-Rails: None ?Home Layout: Multi-level;Able to live on main level with bedroom/bathroom ?  ?  ?Bathroom Shower/Tub: Gaffer;Door ?  ?Bathroom Toilet: Standard ?  ?  ?Home Equipment: Kasandra Knudsen - single point ?  ?Additional Comments:  "walking stick" ?  ? ?  ?Prior Functioning/Environment Prior Level of Function : Independent/Modified Independent ?  ?  ?  ?  ?  ?  ?Mobility Comments: amb with "walking stick" when outside ?  ?  ? ?  ?  ?OT Problem List: Decreased activity tolerance;Impaired UE functional use;Decreased knowledge of use of DME or AE;Decreased safety awareness;Decreased knowledge of precautions;Impaired balance (sitting and/or standing);Pain ?  ?   ?OT Treatment/Interventions: Self-care/ADL training;Therapeutic activities;DME and/or AE instruction;Balance training;Neuromuscular education;Energy conservation;Patient/family education  ?  ?OT Goals(Current goals can be found in the care plan section) Acute Rehab OT Goals ?Patient Stated Goal: to get better ?OT Goal Formulation: With patient ?Time For Goal Achievement: 01/16/22 ?Potential to Achieve Goals: Good  ?OT Frequency: Min 2X/week ?  ? ?Co-evaluation   ?  ?  ?  ?  ? ?  ?AM-PAC OT "6 Clicks" Daily Activity     ?Outcome Measure Help from another person eating meals?: A Little ?Help from another person taking care of personal grooming?: A Little ?Help from another person toileting, which includes using toliet, bedpan, or urinal?: A Lot ?Help from another person bathing (including washing, rinsing, drying)?: A Lot ?Help from another person to put on and taking off regular upper body clothing?: A Lot ?Help from another person to put on and taking off regular lower body clothing?: A Lot ?6 Click Score: 14 ?  ?End of Session Nurse Communication:  Other (comment) (ok to participate in session) ? ?Activity Tolerance: Patient tolerated treatment well ?Patient left: in chair;with call bell/phone within reach ? ?OT Visit Diagnosis: Unsteadiness on feet (R26.81);Pain ?Pain - Right/Left: Left ?Pain - part of body: Shoulder  ?              ?Time: 9692-4932 ?OT Time Calculation (min): 24 min ?Charges:  OT General Charges ?$OT Visit: 1 Visit ?OT Evaluation ?$OT Eval Low Complexity: 1 Low ?OT  Treatments ?$Self Care/Home Management : 8-22 mins ? ?Tareq Dwan OTR/L, MS ?Acute Rehabilitation Department ?Office# [phone] ?Pager# [phone] ? ? ?Alicia Crawford ?01/02/2022, 12:39 PM ?

## 2022-01-02 NOTE — Plan of Care (Signed)
  Problem: Education: Goal: Knowledge of General Education information will improve Description: Including pain rating scale, medication(s)/side effects and non-pharmacologic comfort measures Outcome: Progressing   Problem: Activity: Goal: Risk for activity intolerance will decrease Outcome: Progressing   Problem: Pain Managment: Goal: General experience of comfort will improve Outcome: Progressing   

## 2022-01-02 NOTE — Evaluation (Signed)
Physical Therapy Evaluation ?Patient Details ?Name: Alicia Crawford ?MRN: [id number] ?DOB: 02-Dec-1955 ?Today's Date: 01/02/2022 ? ?History of Present Illness ? 66 y.o. female complaining of left shoulder pain after recent fall,  X-rays show proximal, displaced, comminuted L  humerus fracture. pt is s/p L rTSA on 12/31/21. PMH: anoxic braininjury, subdural hematoma, dementia, R ankle fx, spine surgery/PLIF, panniculectomy, HTN, DM, CVA  ?Clinical Impression ? Pt admitted with above diagnosis.  ?Pt motivated and cooperative. Unsteady gait requiring intermittent HHA for balance/safety.  Pt states she has 'walkign stick" to use at home, will likely be fine with this device. Continue to follow in acute setting ? Pt currently with functional limitations due to the deficits listed below (see PT Problem List). Pt will benefit from skilled PT to increase their independence and safety with mobility to allow discharge to the venue listed below.   ?   ?   ? ?Recommendations for follow up therapy are one component of a multi-disciplinary discharge planning process, led by the attending physician.  Recommendations may be updated based on patient status, additional functional criteria and insurance authorization. ? ?Follow Up Recommendations Follow physician's recommendations for discharge plan and follow up therapies (HHPT) ? ?  ?Assistance Recommended at Discharge Intermittent Supervision/Assistance  ?Patient can return home with the following ? Assistance with cooking/housework;Assist for transportation;Help with stairs or ramp for entrance;A little help with bathing/dressing/bathroom ? ?  ?Equipment Recommendations None recommended by PT  ?Recommendations for Other Services ?    ?  ?Functional Status Assessment Patient has had a recent decline in their functional status and demonstrates the ability to make significant improvements in function in a reasonable and predictable amount of time.  ? ?  ?Precautions / Restrictions  Precautions ?Precautions: Fall ?Required Braces or Orthoses: Sling ?Restrictions ?LUE Weight Bearing: Non weight bearing  ? ?  ? ?Mobility ? Bed Mobility ?  ?  ?  ?  ?  ?  ?  ?General bed mobility comments: in recliner on arrival ?  ? ?Transfers ?Overall transfer level: Needs assistance ?Equipment used: None ?Transfers: Sit to/from Stand ?Sit to Stand: Min guard ?  ?  ?  ?  ?  ?General transfer comment: incr time, cues to avoid wt LUE, wide BOS, min/guard for safety ?  ? ?Ambulation/Gait ?Ambulation/Gait assistance: Min guard, Min assist ?Gait Distance (Feet): 160 Feet ?Assistive device: 1 person hand held assist, None ?Gait Pattern/deviations: Step-through pattern, Decreased stride length, Wide base of support, Drifts right/left ?  ?  ?  ?General Gait Details: guarded, unsteady gait however without overt LOB, intermittent HHA on L. stability improved with incr distance ? ?Stairs ?  ?  ?  ?  ?  ? ?Wheelchair Mobility ?  ? ?Modified Rankin (Stroke Patients Only) ?  ? ?  ? ?Balance Overall balance assessment: Needs assistance, History of Falls ?Sitting-balance support: Feet supported, No upper extremity supported ?Sitting balance-Leahy Scale: Good ?  ?  ?  ?Standing balance-Leahy Scale: Fair ?  ?  ?  ?  ?  ?  ?  ?  ?  ?  ?  ?  ?   ? ? ? ?Pertinent Vitals/Pain Pain Assessment ?Pain Assessment: No/denies pain  ? ? ?Home Living Family/patient expects to be discharged to:: Private residence ?Living Arrangements: Spouse/significant other ?Available Help at Discharge: Family;Available 24 hours/day ?Type of Home: House ?Home Access: Stairs to enter ?Entrance Stairs-Rails: None ?Entrance Stairs-Number of Steps: 3 ?  ?Home Layout: Multi-level;Able to live on main level  with bedroom/bathroom ?Home Equipment: Kasandra Knudsen - single point ?Additional Comments: "walking stick"  ?  ?Prior Function Prior Level of Function : Independent/Modified Independent ?  ?  ?  ?  ?  ?  ?Mobility Comments: amb with "walking stick" when outside ?  ?   ? ? ?Hand Dominance  ? Dominant Hand: Right ? ?  ?Extremity/Trunk Assessment  ? Upper Extremity Assessment ?Upper Extremity Assessment: Defer to OT evaluation ?LUE Deficits / Details: L TSA with no ROM and NWB restrictions in place. can wiggle fingers report thumb is still alittle numb ?  ? ?Lower Extremity Assessment ?Lower Extremity Assessment: Overall WFL for tasks assessed;RLE deficits/detail ?RLE Deficits / Details: df 2/5, otherwise grossly WFL ?  ? ?   ?Communication  ? Communication: No difficulties  ?Cognition Arousal/Alertness: Awake/alert ?Behavior During Therapy: [facility] for tasks assessed/performed ?Overall Cognitive Status: Within Functional Limits for tasks assessed ?  ?  ?  ?  ?  ?  ?  ?  ?  ?  ?  ?  ?  ?  ?  ?  ?  ?  ?  ? ?  ?General Comments   ? ?  ?Exercises    ? ?Assessment/Plan  ?  ?PT Assessment Patient needs continued PT services  ?PT Problem List Decreased mobility;Decreased activity tolerance;Decreased balance;Decreased knowledge of precautions ? ?   ?  ?PT Treatment Interventions DME instruction;Therapeutic exercise;Gait training;Stair training;Functional mobility training;Therapeutic activities;Patient/family education   ? ?PT Goals (Current goals can be found in the Care Plan section)  ?Acute Rehab PT Goals ?Patient Stated Goal: home soon ?PT Goal Formulation: With patient ?Time For Goal Achievement: 01/16/22 ?Potential to Achieve Goals: Good ? ?  ?Frequency Min 3X/week ?  ? ? ?Co-evaluation   ?  ?  ?  ?  ? ? ?  ?AM-PAC PT "6 Clicks" Mobility  ?Outcome Measure Help needed turning from your back to your side while in a flat bed without using bedrails?: A Little ?Help needed moving from lying on your back to sitting on the side of a flat bed without using bedrails?: A Little ?Help needed moving to and from a bed to a chair (including a wheelchair)?: A Little ?Help needed standing up from a chair using your arms (e.g., wheelchair or bedside chair)?: A Little ?Help needed to walk in hospital  room?: A Little ?Help needed climbing 3-5 steps with a railing? : A Little ?6 Click Score: 18 ? ?  ?End of Session Equipment Utilized During Treatment: Gait belt ?Activity Tolerance: Patient tolerated treatment well ?Patient left: in chair;with call bell/phone within reach;with chair alarm set ?  ?PT Visit Diagnosis: Unsteadiness on feet (R26.81);Other abnormalities of gait and mobility (R26.89);History of falling (Z91.81) ?  ? ?Time: 0045-9977 ?PT Time Calculation (min) (ACUTE ONLY): 11 min ? ? ?Charges:   PT Evaluation ?$PT Eval Low Complexity: 1 Low ?  ?  ?   ? ? ?Baxter Flattery, PT ? ?Acute Rehab Dept [facility]) [phone] ?Pager [phone] ? ?01/02/2022 ? ? ?Anand Tejada ?01/02/2022, 11:45 AM ? ?

## 2022-01-03 ENCOUNTER — Encounter (HOSPITAL_COMMUNITY): Payer: Self-pay | Admitting: Orthopedic Surgery

## 2022-01-03 DIAGNOSIS — W19XXXD Unspecified fall, subsequent encounter: Secondary | ICD-10-CM | POA: Diagnosis not present

## 2022-01-03 DIAGNOSIS — S4292XD Fracture of left shoulder girdle, part unspecified, subsequent encounter for fracture with routine healing: Secondary | ICD-10-CM | POA: Diagnosis not present

## 2022-01-03 LAB — GLUCOSE, CAPILLARY: Glucose-Capillary: 100 mg/dL — ABNORMAL HIGH (ref 70–99)

## 2022-01-03 NOTE — Progress Notes (Signed)
? ?  Subjective: ?3 Days Post-Op Procedure(s) (LRB): ?REVERSE SHOULDER ARTHROPLASTY (Left)  ?Pt feeling better today  ?Ready for d/c today ?Daughter to stay with her and help with ADLs ?Patient reports pain as mild. ? ?Objective:  ? ?VITALS:   ?Vitals:  ? 01/02/22 2100 01/03/22 0601  ?BP: 122/83 133/69  ?Pulse: (!) 110 94  ?Resp: 18 18  ?Temp: 99.7 ?F (37.6 ?C) 98.6 ?F (37 ?C)  ?SpO2: 95% 92%  ? ? ?Left shoulder: well healed incision to anterior shoulder ?Nv intact distally ?No rashes or edema ?Sling in good position ? ?LABS ?Recent Labs  ?  12/31/21 ?1329 01/01/22 ?5170 01/01/22 ?0174 01/01/22 ?1854 01/02/22 ?9449  ?HGB 9.1*   < > 5.9* 8.6* 9.5*  ?HCT 28.7*   < > 19.9* 25.6* 28.5*  ?WBC 10.2  --   --   --  8.8  ?PLT 280  --   --   --  193  ? < > = values in this interval not displayed.  ? ? ?Recent Labs  ?  12/31/21 ?1329 01/01/22 ?6759 01/02/22 ?1638  ?NA 133* 138 137  ?K 4.2 4.0 3.6  ?BUN 49* 38* 25*  ?CREATININE 2.25* 1.41* 1.00  ?GLUCOSE 137* 118* 112*  ? ? ? ?Assessment/Plan: ?3 Days Post-Op Procedure(s) (LRB): ?REVERSE SHOULDER ARTHROPLASTY (Left) ?Plan to d/c today with daughter ?F/u in 2 weeks for recheck ?Acute blood loss anemia improved ?Pain medications sent to her pharmacy ?Pt in agreement ? ? ? ?Brad Iniko Robles PA-C, MPAS ?[place] [facility] [employer]  [place] ?[address], [address], [place], [place] [address] ?Phone: [phone] ?[email or web address] ?Facebook  Engineer, structural  ? ? ? ?  ?

## 2022-01-03 NOTE — Discharge Summary (Signed)
? ?In most cases prophylactic antibiotics for Dental procdeures after total joint surgery are not necessary. ? ?Exceptions are as follows: ? ?1. History of prior total joint infection ? ?2. Severely immunocompromised (Organ Transplant, cancer chemotherapy, Rheumatoid biologic ?meds such as [place]) ? ?3. Poorly controlled diabetes (A1C &gt; 8.0, blood glucose over 200) ? ?If you have one of these conditions, contact your surgeon for an antibiotic prescription, prior to your ?dental procedure. Orthopedic Discharge Summary ? ? ? ?  ? ? ?Physician Discharge Summary  ?Patient ID: ?Alicia Crawford ?MRN: [id number] ?DOB/AGE: 09/07/1956 66 y.o. ? ?Admit date: 12/31/2021 ?Discharge date: 01/03/2022  ? ?Procedures:  ?Procedure(s) (LRB): ?REVERSE SHOULDER ARTHROPLASTY (Left) ? ?Attending Physician:  Dr. Esmond Plants ? ?Admission Diagnoses:   left proximal humerus fracture ? ? ?Discharge Diagnoses:  left proximal humerus fracture, acute blood loss anemia ? ? ?Past Medical History:  ?Diagnosis Date  ? Ankle fracture 09/05/2016  ? right ankle  ? Anoxic brain injury ([place]) 08/2010  ? "w/cardiac arrest"  ? Anxiety Jan. 2012  ? Blood transfusion 09/2010  ? Cardiac arrest ([place]) 08/04/2010  ? Carotid artery occlusion   ? mild to moderate bilateral internal carotid artery stenosis  ? Claudication [facility])   ? Complication of anesthesia   ? [place]  ? Coronary artery disease   ? CABG 2011 in [place]  ? Coronary artery disease   ? Dementia   ? Depression   ? DVT (deep venous thrombosis) ([place])   ? GERD (gastroesophageal reflux disease)   ? High cholesterol   ? Hypertension   ? Myocardial infarction ([place]) 09/27/10  ? in [place]  ? Obesity   ? Peripheral vascular disease ([place])   ? bilat SFA diseae  ? Renal artery stenosis ([place])   ? Rotator cuff tendonitis   ? Sleep apnea   ? uses CPAP nightly  ? Stroke [facility]) 2011  ? Subdural hematoma May 25,2013  ? Frontal subdural  S/P evacuation of hematoma  ? Type II diabetes mellitus ([place])   ? Diet and  Exercise  ? ? ?PCP: Eunice Blase, MD  ? ?Discharged Condition: good ? ?Hospital Course:  Patient underwent the above stated procedure on 12/31/2021. Patient tolerated the procedure well and brought to the recovery room in good condition and subsequently to the floor. Patient developed acute blood loss anemia after surgery but improved after transfusion. Therapy went well. No other complications with her hospital stay ? ? ?Disposition: Discharge disposition: 01-Home or Self Care ? ? ? ? ? with follow up in 2 weeks ? ? ? Follow-up Information   ? ? Netta Cedars, MD Follow up.   ?Specialty: Orthopedic Surgery ?Contact information: ?[address] ?[address] ?[place] [address] ?[phone] ? ? ?  ?  ? ?  ?  ? ?  ? ? ?Dental Antibiotics: ? ?In most cases prophylactic antibiotics for Dental procdeures after total joint surgery are not necessary. ? ?Exceptions are as follows: ? ?1. History of prior total joint infection ? ?2. Severely immunocompromised (Organ Transplant, cancer chemotherapy, Rheumatoid biologic ?meds such as [place]) ? ?3. Poorly controlled diabetes (A1C &gt; 8.0, blood glucose over 200) ? ?If you have one of these conditions, contact your surgeon for an antibiotic prescription, prior to your ?dental procedure. ? ?Discharge Instructions   ? ? Call MD / Call 911   Complete by: As directed ?  ? If you experience chest pain or shortness of breath, CALL 911 and be transported to the  hospital emergency room.  If you develope a fever above 101 F, pus (white drainage) or increased drainage or redness at the wound, or calf pain, call your surgeon's office.  ? Constipation Prevention   Complete by: As directed ?  ? Drink plenty of fluids.  Prune juice may be helpful.  You may use a stool softener, such as Colace (over the counter) 100 mg twice a day.  Use MiraLax (over the counter) for constipation as needed.  ? Diet - low sodium heart healthy   Complete by: As directed ?  ? Increase activity slowly as  tolerated   Complete by: As directed ?  ? Post-operative opioid taper instructions:   Complete by: As directed ?  ? POST-OPERATIVE OPIOID TAPER INSTRUCTIONS: ?It is important to wean off of your opioid medication as soon as possible. If you do not need pain medication after your surgery it is ok to stop day one. ?Opioids include: ?Codeine, Hydrocodone(Norco, Vicodin), Oxycodone(Percocet, oxycontin) and hydromorphone amongst others.  ?Long term and even short term use of opiods can cause: ?Increased pain response ?Dependence ?Constipation ?Depression ?Respiratory depression ?And more.  ?Withdrawal symptoms can include ?Flu like symptoms ?Nausea, vomiting ?And more ?Techniques to manage these symptoms ?Hydrate well ?Eat regular healthy meals ?Stay active ?Use relaxation techniques(deep breathing, meditating, yoga) ?Do Not substitute Alcohol to help with tapering ?If you have been on opioids for less than two weeks and do not have pain than it is ok to stop all together.  ?Plan to wean off of opioids ?This plan should start within one week post op of your joint replacement. ?Maintain the same interval or time between taking each dose and first decrease the dose.  ?Cut the total daily intake of opioids by one tablet each day ?Next start to increase the time between doses. ?The last dose that should be eliminated is the evening dose.  ? ?  ? ?  ? ? ?Allergies as of 01/03/2022   ? ?   Reactions  ? [place] Hives, Other (See Comments)  ? Gas used 1980's ?Reaction: affected her liver  ? ?  ? ?  ?Medication List  ?  ? ?TAKE these medications   ? ?acetaminophen 650 MG CR tablet ?Commonly known as: TYLENOL ?Take 650 mg by mouth every 6 (six) hours as needed for pain. ?  ?ALPRAZolam 0.5 MG tablet ?Commonly known as: Xanax ?Take 1/2-1 tablet twice daily as needed for severe anxiety ?What changed:  ?how much to take ?how to take this ?when to take this ?reasons to take this ?additional instructions ?  ?amLODipine 5 MG  tablet ?Commonly known as: NORVASC ?Take 5 mg by mouth daily. ?  ?aspirin EC 81 MG tablet ?Take 81 mg by mouth daily. ?  ?atorvastatin 80 MG tablet ?Commonly known as: LIPITOR ?TAKE 1 TABLET BY MOUTH DAILY ?  ?chlorpheniramine 4 MG tablet ?Commonly known as: Chlor-[place] ?Take 1 tablet (4 mg total) by mouth at bedtime as needed for allergies. ?  ?cholecalciferol 25 MCG (1000 UNIT) tablet ?Commonly known as: VITAMIN D ?Take 1,000 Units by mouth daily. ?  ?cilostazol 100 MG tablet ?Commonly known as: PLETAL ?TAKE 1 TABLET BY MOUTH TWICE DAILY BEFORE MEALS ?What changed: when to take this ?  ?Coenzyme Q10 300 MG Caps ?Take 300 mg by mouth daily. ?  ?estradiol 0.1 MG/GM vaginal cream ?Commonly known as: ESTRACE ?Place 0.5 g vaginally 2 (two) times a week. ?  ?fluticasone 50 MCG/ACT nasal spray ?Commonly known as: FLONASE ?  SHAKE LIQUID AND USE 1 SPRAY IN EACH NOSTRIL DAILY ?What changed: See the new instructions. ?  ?loratadine 10 MG tablet ?Commonly known as: CLARITIN ?TAKE 1 TABLET(10 MG) BY MOUTH DAILY ?What changed: See the new instructions. ?  ?magnesium oxide 400 MG tablet ?Commonly known as: MAG-OX ?Take 400 mg by mouth 2 (two) times daily. ?  ?melatonin 5 MG Tabs ?Take 5 mg by mouth at bedtime as needed (sleep). ?  ?metFORMIN 1000 MG tablet ?Commonly known as: GLUCOPHAGE ?Take 1,000 mg by mouth 2 (two) times daily with a meal. ?  ?methocarbamol 500 MG tablet ?Commonly known as: Robaxin ?Take 1 tablet (500 mg total) by mouth every 8 (eight) hours as needed for muscle spasms. ?  ?metoprolol tartrate 25 MG tablet ?Commonly known as: LOPRESSOR ?TAKE 1 TABLET(25 MG) BY MOUTH TWICE DAILY ?What changed: See the new instructions. ?  ?ondansetron 4 MG tablet ?Commonly known as: Zofran ?Take 1 tablet (4 mg total) by mouth every 8 (eight) hours as needed for nausea, vomiting or refractory nausea / vomiting. ?  ?oxyCODONE-acetaminophen 5-325 MG tablet ?Commonly known as: PERCOCET/ROXICET ?Take 1 tablet by mouth every 6  (six) hours as needed for severe pain. ?  ?pantoprazole 40 MG tablet ?Commonly known as: PROTONIX ?TAKE 1 TABLET(40 MG) BY MOUTH DAILY ?What changed: See the new instructions. ?  ?QUERCETIN PO ?Take 1,000 mg by mouth

## 2022-01-03 NOTE — Plan of Care (Signed)
  Problem: Education: Goal: Knowledge of General Education information will improve Description: Including pain rating scale, medication(s)/side effects and non-pharmacologic comfort measures Outcome: Progressing   Problem: Activity: Goal: Risk for activity intolerance will decrease Outcome: Progressing   Problem: Pain Managment: Goal: General experience of comfort will improve Outcome: Progressing   

## 2022-01-03 NOTE — Progress Notes (Signed)
Occupational Therapy Treatment ?Patient Details ?Name: Alicia Crawford ?MRN: [id number] ?DOB: 09/14/1956 ?Today's Date: 01/03/2022 ? ? ?History of present illness 66 y.o. female complaining of left shoulder pain after recent fall,  X-rays show proximal, displaced, comminuted L  humerus fracture. pt is s/p L rTSA on 12/31/21. PMH: anoxic braininjury, subdural hematoma, dementia, R ankle fx, spine surgery/PLIF, panniculectomy, HTN, DM, CVA ?  ?OT comments ? Patient and daughter were educated on recommendations from ortho and how to maintain them during ADLs. Patient and daughter verbalized understanding. Patient was mod A to don sling and shirt with daughter able to assist patient and provide cues appropriately. Patient was min A for LB dressing tasks with RUE. Patient's daughter to provide support for ADLs at home. Patient's discharge plan remains appropriate at this time. OT will continue to follow acutely.  ?  ? ?Recommendations for follow up therapy are one component of a multi-disciplinary discharge planning process, led by the attending physician.  Recommendations may be updated based on patient status, additional functional criteria and insurance authorization. ?   ?Follow Up Recommendations ? Follow physician's recommendations for discharge plan and follow up therapies  ?  ?Assistance Recommended at Discharge Frequent or constant Supervision/Assistance  ?Patient can return home with the following ? A little help with walking and/or transfers;A lot of help with bathing/dressing/bathroom;Assistance with cooking/housework;Direct supervision/assist for medications management;Assist for transportation;Help with stairs or ramp for entrance;Direct supervision/assist for financial management ?  ?Equipment Recommendations ? None recommended by OT  ?  ?Recommendations for Other Services   ? ?  ?Precautions / Restrictions Precautions ?Precautions: Fall;Shoulder ?Type of Shoulder Precautions: NO ROM shoulder, ok for hand wrist  and elbow ROM ?Shoulder Interventions: Shoulder sling/immobilizer ?Precaution Booklet Issued: Yes (comment) ?Required Braces or Orthoses: Sling ?Restrictions ?Weight Bearing Restrictions: Yes ?LUE Weight Bearing: Non weight bearing  ? ? ?  ? ?Mobility Bed Mobility ?  ?  ?  ?  ?  ?  ?  ?  ?  ? ?Transfers ?  ?  ?  ?  ?  ?  ?  ?  ?  ?  ?  ?  ?Balance   ?  ?  ?  ?  ?  ?  ?  ?  ?  ?  ?  ?  ?  ?  ?  ?  ?  ?  ?   ? ?ADL either performed or assessed with clinical judgement  ? ?ADL Overall ADL's : Needs assistance/impaired ?  ?  ?  ?  ?  ?  ?  ?  ?Upper Body Dressing : Moderate assistance;Sitting ?Upper Body Dressing Details (indicate cue type and reason): with cues to relax shoulder multilpe times daughter able to carry over cues. ?Lower Body Dressing: Minimal assistance;Sit to/from stand ?Lower Body Dressing Details (indicate cue type and reason): with increased time and one UE ?  ?  ?  ?  ?  ?  ?  ?  ?  ? ?Extremity/Trunk Assessment   ?  ?  ?  ?  ?  ? ?Vision   ?  ?  ?Perception   ?  ?Praxis   ?  ? ?Cognition Arousal/Alertness: Awake/alert ?Behavior During Therapy: [facility] for tasks assessed/performed ?Overall Cognitive Status: Within Functional Limits for tasks assessed ?  ?  ?  ?  ?  ?  ?  ?  ?  ?  ?  ?  ?  ?  ?  ?  ?General Comments: noted to  have some confusion with carryover of education. plesant and motivated to participate ?  ?  ?   ?Exercises   ? ?  ?Shoulder Instructions Shoulder Instructions ?Donning/doffing shirt without moving shoulder: Caregiver independent with task ?Method for sponge bathing under operated UE: Caregiver independent with task ?Donning/doffing sling/immobilizer: Caregiver independent with task ?Correct positioning of sling/immobilizer: Caregiver independent with task ?ROM for elbow, wrist and digits of operated UE: Caregiver independent with task;Supervision/safety ?Proper positioning of operated UE when showering: Caregiver independent with task ?Positioning of UE while sleeping: Caregiver  independent with task ? ? ?  ?General Comments    ? ? ?Pertinent Vitals/ Pain       Pain Assessment ?Pain Assessment: Faces ?Faces Pain Scale: Hurts a little bit ?Pain Location: L shoulder ?Pain Descriptors / Indicators: Discomfort ?Pain Intervention(s): Monitored during session ? ?Home Living   ?  ?  ?  ?  ?  ?  ?  ?  ?  ?  ?  ?  ?  ?  ?  ?  ?  ?  ? ?  ?Prior Functioning/Environment    ?  ?  ?  ?   ? ?Frequency ? Min 2X/week  ? ? ? ? ?  ?Progress Toward Goals ? ?OT Goals(current goals can now be found in the care plan section) ? Progress towards OT goals: Progressing toward goals ? ?   ?Plan Discharge plan remains appropriate   ? ?Co-evaluation ? ? ?   ?  ?  ?  ?  ? ?  ?AM-PAC OT "6 Clicks" Daily Activity     ?Outcome Measure ? ? Help from another person eating meals?: A Little ?Help from another person taking care of personal grooming?: A Little ?Help from another person toileting, which includes using toliet, bedpan, or urinal?: A Little ?Help from another person bathing (including washing, rinsing, drying)?: A Lot ?Help from another person to put on and taking off regular upper body clothing?: A Lot ?Help from another person to put on and taking off regular lower body clothing?: A Lot ?6 Click Score: 15 ? ?  ?End of Session   ? ?OT Visit Diagnosis: Unsteadiness on feet (R26.81);Pain ?Pain - Right/Left: Left ?Pain - part of body: Shoulder ?  ?Activity Tolerance Patient tolerated treatment well ?  ?Patient Left in chair;with call bell/phone within reach;with family/visitor present ?  ?Nurse Communication Other (comment) (ok to participate) ?  ? ?   ? ?Time: 1607-3710 ?OT Time Calculation (min): 26 min ? ?Charges: OT General Charges ?$OT Visit: 1 Visit ?OT Treatments ?$Self Care/Home Management : 23-37 mins ? ?Alicia Crawford OTR/L, MS ?Acute Rehabilitation Department ?Office# [phone] ?Pager# [phone] ? ? ?Alicia Crawford ?01/03/2022, 11:25 AM ?

## 2022-01-03 NOTE — Anesthesia Postprocedure Evaluation (Signed)
Anesthesia Post Note ? ?Patient: Alicia Crawford ? ?Procedure(s) Performed: REVERSE SHOULDER ARTHROPLASTY (Left: Shoulder) ? ?  ? ?Patient location during evaluation: PACU ?Anesthesia Type: General ?Level of consciousness: awake and alert ?Pain management: pain level controlled ?Vital Signs Assessment: post-procedure vital signs reviewed and stable ?Respiratory status: spontaneous breathing, nonlabored ventilation, respiratory function stable and patient connected to nasal cannula oxygen ?Cardiovascular status: blood pressure returned to baseline and stable ?Postop Assessment: no apparent nausea or vomiting ?Anesthetic complications: no ? ? ?No notable events documented. ? ?Last Vitals:  ?Vitals:  ? 01/02/22 2100 01/03/22 0601  ?BP: 122/83 133/69  ?Pulse: (!) 110 94  ?Resp: 18 18  ?Temp: 37.6 ?C 37 ?C  ?SpO2: 95% 92%  ?  ?Last Pain:  ?Vitals:  ? 01/03/22 0601  ?TempSrc: Oral  ?PainSc:   ? ? ?  ?  ?  ?  ?  ?  ? ?Abdulaziz Toman S ? ? ? ? ?

## 2022-01-03 NOTE — Progress Notes (Signed)
?  Transition of Care (TOC) Screening Note ? ? ?Patient Details  ?Name: Alicia Crawford ?Date of Birth: [birth year] ? ? ?Transition of Care (TOC) CM/SW Contact:    ?Karyn Brull, LCSW ?Phone Number: ?01/03/2022, 10:35 AM ? ? ? ?Transition of Care Department [facility]) has reviewed patient and no TOC needs have been identified at this time. We will continue to monitor patient advancement through interdisciplinary progression rounds. If new patient transition needs arise, please place a TOC consult. ? ? ?

## 2022-01-03 NOTE — Progress Notes (Signed)
Physical Therapy Treatment ?Patient Details ?Name: Alicia Crawford ?MRN: [id number] ?DOB: Nov 28, 1955 ?Today's Date: 01/03/2022 ? ? ?History of Present Illness 66 y.o. female complaining of left shoulder pain after recent fall,  X-rays show proximal, displaced, comminuted L  humerus fracture. pt is s/p L rTSA on 12/31/21. PMH: anoxic braininjury, subdural hematoma, dementia, R ankle fx, spine surgery/PLIF, panniculectomy, HTN, DM, CVA ? ?  ?PT Comments  ? ? Pt meeting goals. Ready for d/c with family assist as needed from PT standpoint.  ?Recommendations for follow up therapy are one component of a multi-disciplinary discharge planning process, led by the attending physician.  Recommendations may be updated based on patient status, additional functional criteria and insurance authorization. ? ?Follow Up Recommendations ? Follow physician's recommendations for discharge plan and follow up therapies ?  ?  ?Assistance Recommended at Discharge Intermittent Supervision/Assistance  ?Patient can return home with the following Assistance with cooking/housework;Assist for transportation;Help with stairs or ramp for entrance;A little help with bathing/dressing/bathroom ?  ?Equipment Recommendations ? None recommended by PT  ?  ?Recommendations for Other Services   ? ? ?  ?Precautions / Restrictions Precautions ?Precautions: Fall;Shoulder ?Type of Shoulder Precautions: NO ROM shoulder, ok for hand wrist and elbow ROM ?Shoulder Interventions: Shoulder sling/immobilizer ?Required Braces or Orthoses: Sling ?Restrictions ?Weight Bearing Restrictions: Yes ?LUE Weight Bearing: Non weight bearing  ?  ? ?Mobility ? Bed Mobility ?Overal bed mobility: Needs Assistance ?Bed Mobility: Supine to Sit ?  ?  ?Supine to sit: Min assist ?  ?  ?General bed mobility comments: assist to elevate trunk ?  ? ?Transfers ?Overall transfer level: Needs assistance ?Equipment used: None ?Transfers: Sit to/from Stand ?Sit to Stand: Min guard, Supervision ?  ?  ?  ?   ?  ?General transfer comment: incr time, cues to avoid wt LUE, wide BOS, min/guard-supervision  for safety ?  ? ?Ambulation/Gait ?Ambulation/Gait assistance: Min guard ?Gait Distance (Feet): 150 Feet ?Assistive device: None, Straight cane ?Gait Pattern/deviations: Step-through pattern, Decreased stride length, Wide base of support ?  ?  ?  ?General Gait Details: improved gait stability, no LOB, utilzed can second half of distance--pt more confident with additional support ? ? ?Stairs ?Stairs: Yes ?Stairs assistance: Min guard, Min assist ?Stair Management: With cane, Step to pattern, No rails ?Number of Stairs: 3 ?General stair comments: cues for sequence and safety. steady, no LOB ? ? ?Wheelchair Mobility ?  ? ?Modified Rankin (Stroke Patients Only) ?  ? ? ?  ?Balance   ?  ?Sitting balance-Leahy Scale: Good ?  ?  ?  ?Standing balance-Leahy Scale: Fair ?  ?  ?  ?  ?  ?  ?  ?  ?  ?  ?  ?  ?  ? ?  ?Cognition Arousal/Alertness: Awake/alert ?Behavior During Therapy: [facility] for tasks assessed/performed ?Overall Cognitive Status: Within Functional Limits for tasks assessed ?  ?  ?  ?  ?  ?  ?  ?  ?  ?  ?  ?  ?  ?  ?  ?  ?  ?  ?  ? ?  ?Exercises   ? ?  ?General Comments   ?  ?  ? ?Pertinent Vitals/Pain Pain Assessment ?Pain Assessment: No/denies pain  ? ? ?Home Living   ?  ?  ?  ?  ?  ?  ?  ?  ?  ?   ?  ?Prior Function    ?  ?  ?   ? ?  PT Goals (current goals can now be found in the care plan section) Acute Rehab PT Goals ?Patient Stated Goal: home soon ?PT Goal Formulation: With patient ?Time For Goal Achievement: 01/16/22 ?Potential to Achieve Goals: Good ?Progress towards PT goals: Progressing toward goals ? ?  ?Frequency ? ? ? Min 5X/week ? ? ? ?  ?PT Plan Current plan remains appropriate  ? ? ?Co-evaluation   ?  ?  ?  ?  ? ?  ?AM-PAC PT "6 Clicks" Mobility   ?Outcome Measure ? Help needed turning from your back to your side while in a flat bed without using bedrails?: A Little ?Help needed moving from lying on your back  to sitting on the side of a flat bed without using bedrails?: A Little ?Help needed moving to and from a bed to a chair (including a wheelchair)?: A Little ?Help needed standing up from a chair using your arms (e.g., wheelchair or bedside chair)?: A Little ?Help needed to walk in hospital room?: A Little ?Help needed climbing 3-5 steps with a railing? : A Little ?6 Click Score: 18 ? ?  ?End of Session Equipment Utilized During Treatment: Gait belt ?Activity Tolerance: Patient tolerated treatment well ?Patient left: in chair;with call bell/phone within reach;with chair alarm set ?  ?PT Visit Diagnosis: Unsteadiness on feet (R26.81);Other abnormalities of gait and mobility (R26.89);History of falling (Z91.81) ?  ? ? ?Time: 9678-9381 ?PT Time Calculation (min) (ACUTE ONLY): 12 min ? ?Charges:  $Gait Training: 8-22 mins          ?          ? Baxter Flattery, PT ? ?Acute Rehab Dept [facility]) [phone] ?Pager [phone] ? ?01/03/2022 ? ? ? ?Alicia Crawford ?01/03/2022, 10:30 AM ? ?

## 2022-01-05 ENCOUNTER — Encounter: Payer: Self-pay | Admitting: Obstetrics and Gynecology

## 2022-01-05 ENCOUNTER — Ambulatory Visit (INDEPENDENT_AMBULATORY_CARE_PROVIDER_SITE_OTHER): Payer: PPO | Admitting: Obstetrics and Gynecology

## 2022-01-05 VITALS — BP 147/78 | HR 111

## 2022-01-05 DIAGNOSIS — N393 Stress incontinence (female) (male): Secondary | ICD-10-CM

## 2022-01-05 DIAGNOSIS — N813 Complete uterovaginal prolapse: Secondary | ICD-10-CM

## 2022-01-05 NOTE — Patient Instructions (Signed)
URODYNAMICS (UDS) TEST INFORMATION ? ?IMPORTANT: Please try to arrive with a comfortably full bladder!   ? ?What is UDS? ?Urodynamics is a bladder test used to evaluate how your bladder and urethra (tube you urinate out of) work to help find out the cause of your bladder symptoms and evaluate your bladder function in order to make the best treatment plan for you.  ? ?What to expect? ?A nurse will perform the test and will be with you during the entire exam. ?First we will have to empty your bladder on a special toilet.  After you have emptied your bladder, very small catheters (plastic tubing) will be placed into your bladder and into your vagina (or rectum). These special small catheters measure pressure to help measure your bladder function.  Your bladder will be gently filled with water and you will be asked to cough and strain at several different points during the test.   ?You will then be asked to empty your bladder in the special toilet with the catheters in place. Most patients can urinate (pee) easily with the catheters in place since the catheters are so small. ?In total this procedure lasts about 45 minutes to 1 hour.  ?After your test is completed, you will return (or possibly be seen the same day) to review the results, talk about treatment options and make a plan moving forward. ? ? ?We will meet again after the test to review the procedure and finalize your plan for surgery.  ?

## 2022-01-05 NOTE — Progress Notes (Signed)
[facility] ? ? ?Subjective:  ?  ? ?Chief Complaint:  ?Chief Complaint  ?Patient presents with  ? Pessary Check  ? ?History of Present Illness: ?Alicia Crawford is a 66 y.o. female with stage IV pelvic organ prolapse who presents for a pessary check. She is using a size 3in  short stem gellhorn pessary. The pessary has been working well overall but she does not want it long term. She wants to be able to be sexually active and has been unable to manage the pessary herself. She is leaking large amounts of urine with the pessary in place.  ? ?Last week needed an urgent shoulder replacement after a fall, arm currently in a brace.  ? ?Past Medical History: ?Patient  has a past medical history of Ankle fracture (09/05/2016), Anoxic brain injury ([place]) (08/2010), Anxiety (Jan. 2012), Blood transfusion (09/2010), Cardiac arrest ([place]) (08/04/2010), Carotid artery occlusion, Claudication ([place]), Complication of anesthesia, Coronary artery disease, Coronary artery disease, Dementia, Depression, DVT (deep venous thrombosis) ([place]), GERD (gastroesophageal reflux disease), High cholesterol, Hypertension, Myocardial infarction ([place]) (09/27/10), Obesity, Peripheral vascular disease ([place]), Renal artery stenosis ([place]), Rotator cuff tendonitis, Sleep apnea, Stroke ([place]) (2011), Subdural hematoma [facility]) (May 25,2013), and Type II diabetes mellitus ([place]).  ? ?Past Surgical History: ?She  has a past surgical history that includes Ovarian cyst removal (1983); stomach and skin tuck (2000); Posterior fusion lumbar spine (` 2000); Cholecystectomy (~ 1980's); Coronary artery bypass graft (09/2010); Back surgery; Craniotomy (02/25/2012); Lower extremity angiogram (April 2013); Spine surgery; atherectomy (N/A, 01/17/2012); ORIF toe fracture (Left, 04/25/2019); Abdominoplasty/panniculectomy; and Reverse shoulder arthroplasty (Left, 12/31/2021).  ? ?Medications: ?She has a current medication list which includes the following prescription(s):  acetaminophen, alprazolam, amlodipine, vitamin c, aspirin ec, atorvastatin, chlorpheniramine, cholecalciferol, cilostazol, coenzyme q10, estradiol, repatha sureclick, fluticasone, loratadine, magnesium oxide, melatonin, metformin, methocarbamol, metoprolol tartrate, ondansetron, oxycodone-acetaminophen, pantoprazole, quercetin, senna-docusate, sertraline, and zinc gluconate.  ? ?Allergies: ?Patient is allergic to halothane.  ? ?Social History: ?Patient  reports that she quit smoking about 18 years ago. Her smoking use included cigarettes. She has a 30.00 pack-year smoking history. She has never used smokeless tobacco. She reports that she does not currently use alcohol. She reports that she does not use drugs.  ? ?  ? ?Objective:  ?  ?Physical Exam: ?BP (!) 147/78   Pulse (!) 111  ?Gen: No apparent distress, A&O x 3. ?Detailed Urogynecologic Evaluation:  ?Pelvic Exam: Normal external female genitalia; Bartholin's and Skene's glands normal in appearance; urethral meatus normal in appearance, no urethral masses or discharge. Speculum exam revealed no lesions in the vagina. Pessary was replaced. It was comfortable to the patient and fit well.  ? ?POP-Q (01/22/21):  Complete prolapse noted ?  ?POP-Q ?  ?3  ?                                          Aa   ?7 ?                                          Ba   ?8  ?  C  ?  ?7  ?                                          Gh   ?4  ?                                          Pb   ?8  ?                                          tvl  ?  ?3  ?                                          Ap   ?7  ?                                          Bp   ?-2  ?                                            D  ?  ? ?Assessment/Plan:  ?  ?Assessment: ?Alicia Crawford is a 66 y.o. with stage IV pelvic organ prolapse here for a pessary check.  ? ?Plan: ?- We discussed the options for surgery and based on her stage of prolapse and desire for intercourse, recommended robotic  TLH with sacrocolpopexy. She will undergo urodynamic testing first and then we will finalize the plan for her surgery.  ?- In the meantime, she will continue to return for pessary cleanings once every 3 months.  ? ?All questions were answered. ? ?Jaquita Folds, MD ? ? ?Time Spent: I spent 25 minutes dedicated to the care of this patient on the date of this encounter to include pre-visit review of records, face-to-face time with the patient and post visit documentation.  ? ?  ? ?

## 2022-01-10 ENCOUNTER — Other Ambulatory Visit: Payer: Self-pay | Admitting: Cardiovascular Disease

## 2022-01-16 ENCOUNTER — Other Ambulatory Visit: Payer: Self-pay

## 2022-01-16 DIAGNOSIS — F330 Major depressive disorder, recurrent, mild: Secondary | ICD-10-CM

## 2022-01-16 DIAGNOSIS — F410 Panic disorder [episodic paroxysmal anxiety] without agoraphobia: Secondary | ICD-10-CM

## 2022-01-16 MED ORDER — SERTRALINE HCL 100 MG PO TABS: 100.0000 mg | ORAL_TABLET | Freq: Two times a day (BID) | ORAL | 0 refills | Status: DC

## 2022-01-18 DIAGNOSIS — Z4789 Encounter for other orthopedic aftercare: Secondary | ICD-10-CM | POA: Diagnosis not present

## 2022-01-21 ENCOUNTER — Other Ambulatory Visit: Payer: Self-pay | Admitting: Family Medicine

## 2022-01-21 DIAGNOSIS — E2839 Other primary ovarian failure: Secondary | ICD-10-CM

## 2022-01-25 ENCOUNTER — Ambulatory Visit: Payer: PPO | Admitting: Obstetrics and Gynecology

## 2022-01-26 ENCOUNTER — Ambulatory Visit (INDEPENDENT_AMBULATORY_CARE_PROVIDER_SITE_OTHER): Payer: PPO | Admitting: Obstetrics and Gynecology

## 2022-01-26 VITALS — BP 148/85 | HR 77 | Ht 64.0 in | Wt 160.0 lb

## 2022-01-26 DIAGNOSIS — N393 Stress incontinence (female) (male): Secondary | ICD-10-CM

## 2022-01-26 DIAGNOSIS — R350 Frequency of micturition: Secondary | ICD-10-CM

## 2022-01-26 LAB — POCT URINALYSIS DIPSTICK
Appearance: NORMAL
Bilirubin, UA: NEGATIVE
Blood, UA: NEGATIVE
Glucose, UA: NEGATIVE
Ketones, UA: NEGATIVE
Leukocytes, UA: NEGATIVE
Nitrite, UA: NEGATIVE
Protein, UA: POSITIVE — AB
Spec Grav, UA: 1.025 (ref 1.010–1.025)
Urobilinogen, UA: 0.2 E.U./dL
pH, UA: 5.5 (ref 5.0–8.0)

## 2022-01-26 NOTE — Patient Instructions (Signed)

## 2022-01-27 DIAGNOSIS — I129 Hypertensive chronic kidney disease with stage 1 through stage 4 chronic kidney disease, or unspecified chronic kidney disease: Secondary | ICD-10-CM | POA: Diagnosis not present

## 2022-01-27 DIAGNOSIS — F329 Major depressive disorder, single episode, unspecified: Secondary | ICD-10-CM | POA: Diagnosis not present

## 2022-01-27 DIAGNOSIS — E785 Hyperlipidemia, unspecified: Secondary | ICD-10-CM | POA: Diagnosis not present

## 2022-01-27 DIAGNOSIS — I2510 Atherosclerotic heart disease of native coronary artery without angina pectoris: Secondary | ICD-10-CM | POA: Diagnosis not present

## 2022-01-27 DIAGNOSIS — I10 Essential (primary) hypertension: Secondary | ICD-10-CM | POA: Diagnosis not present

## 2022-01-31 ENCOUNTER — Telehealth: Payer: Self-pay | Admitting: Psychiatry

## 2022-01-31 DIAGNOSIS — F410 Panic disorder [episodic paroxysmal anxiety] without agoraphobia: Secondary | ICD-10-CM

## 2022-01-31 MED ORDER — ALPRAZOLAM 0.5 MG PO TABS: ORAL_TABLET | ORAL | 2 refills | Status: DC

## 2022-01-31 NOTE — Telephone Encounter (Signed)
Please let her know that it looks like she should still have one more refill and to call if pharmacy tells her otherwise. Another script was sent that she can get filled in 4 weeks after she uses remaining refill on previous script.  ?

## 2022-01-31 NOTE — Progress Notes (Signed)
[facility] ?Urodynamics Procedure ? ?Referring Physician: London Pepper, MD ?Date of Procedure: 01/26/2022 ? ?Alicia Crawford is a 66 y.o. female who presents for urodynamic evaluation. Indication(s) for study: SUI ? ?Vital Signs: ?BP (!) 148/85   Pulse 77   Ht '5\' 4"'$  (1.626 m)   Wt 160 lb (72.6 kg)   BMI 27.46 kg/m?  ? ?Laboratory Results: ?A catheterized urine specimen revealed: ? ?POC urine: positive protein ? ? ?Voiding Diary: ?Not performed ? ?Procedure Timeout: ? The correct patient was verified and the correct procedure was verified. The patient was in the correct position and safety precautions were reviewed based on at the patient's history. ? ?Urodynamic Procedure ?A 6F dual lumen urodynamics catheter was placed under sterile conditions into the patient's bladder. A 6F catheter was placed into the rectum in order to measure abdominal pressure. EMG patches were placed in the appropriate position.  All connections were confirmed and calibrations/adjusted made. Saline was instilled into the bladder through the dual lumen catheters.  Cough/valsalva pressures were measured periodically during filling.  Patient was allowed to void.  The bladder was then emptied of its residual. ? ?UROFLOW: ?Revealed a Qmax of 4.3 mL/sec.  She voided 16.5 mL and had a residual of 10 mL.  It was a interrupted pattern and represented normal habits though interpretation limited due to low voided volume. ? ?CMG: ?This was performed with sterile water in the sitting position at a fill rate of 30 mL/min.   ? ?First sensation of fullness was 84 mLs,  ?First urge was 116 mLs,  ?Strong urge was 135 mLs and  ?Capacity was 204 mLs ? ?Stress incontinence was demonstrated ?Lowest positive Barrier CLPP was 71 cmH20 at 150 ml. ?Lowest positive Barrier VLPP was 97 cmH20 at 150 ml. ? ?Detrusor function was normal, with no phasic contractions seen.   ? ?Compliance:  normal. End fill detrusor pressure was -5.8cmH20.   ? ?UPP: ?MUCP  with barrier reduction was 65 cm of water.  ? ? ?MICTURITION STUDY: ?Voiding was performed with reduction using scopettes in the sitting position.  Pdet at Qmax was 0 cm of water max Pdet was 28.3cm H20.  Qmax was 14.9 mL/sec.  It was a interrupted pattern.  She voided 188.5 mL and had a residual of 5 mL.  It was a volitional void, sustained detrusor contraction was present and abdominal straining was present ? ?EMG: ?This was performed with patches.  She had voluntary contractions, recruitment with fill was present and urethral sphincter was relaxed with void. ? ?The details of the procedure with the study tracings have been scanned into EPIC.  ? ?Urodynamic Impression:  ?1. Sensation was increased; capacity was reduced ?2. Stress Incontinence was demonstrated at normal pressures; ?3. Detrusor Overactivity was not demonstrated. ?4. Emptying was normal with a normal PVR, a sustained detrusor contraction present,  abdominal straining present, normal urethral sphincter activity on EMG. ? ?Plan: ?- The patient will follow up  to discuss the findings and treatment options.  ? ?

## 2022-01-31 NOTE — Telephone Encounter (Signed)
Pt lvm at 12:22 pm asking for a refill on her xanax 0.'5mg'$  that she takes 1/2 pill a day. Pharmacy is [facility] on [address] and [address] ?

## 2022-02-15 DIAGNOSIS — M25512 Pain in left shoulder: Secondary | ICD-10-CM | POA: Diagnosis not present

## 2022-02-15 DIAGNOSIS — M25312 Other instability, left shoulder: Secondary | ICD-10-CM | POA: Diagnosis not present

## 2022-02-15 DIAGNOSIS — Z96612 Presence of left artificial shoulder joint: Secondary | ICD-10-CM | POA: Diagnosis not present

## 2022-02-17 DIAGNOSIS — Z4789 Encounter for other orthopedic aftercare: Secondary | ICD-10-CM | POA: Diagnosis not present

## 2022-02-23 ENCOUNTER — Encounter: Payer: Self-pay | Admitting: Obstetrics and Gynecology

## 2022-02-23 ENCOUNTER — Ambulatory Visit (INDEPENDENT_AMBULATORY_CARE_PROVIDER_SITE_OTHER): Payer: PPO | Admitting: Obstetrics and Gynecology

## 2022-02-23 VITALS — BP 134/81 | HR 109

## 2022-02-23 DIAGNOSIS — N816 Rectocele: Secondary | ICD-10-CM | POA: Diagnosis not present

## 2022-02-23 DIAGNOSIS — N3281 Overactive bladder: Secondary | ICD-10-CM

## 2022-02-23 DIAGNOSIS — N8110 Cystocele, unspecified: Secondary | ICD-10-CM | POA: Diagnosis not present

## 2022-02-23 DIAGNOSIS — N813 Complete uterovaginal prolapse: Secondary | ICD-10-CM

## 2022-02-23 DIAGNOSIS — N393 Stress incontinence (female) (male): Secondary | ICD-10-CM | POA: Diagnosis not present

## 2022-02-23 MED ORDER — TROSPIUM CHLORIDE ER 60 MG PO CP24: 1.0000 | ORAL_CAPSULE | Freq: Every day | ORAL | 5 refills | Status: DC

## 2022-02-23 NOTE — Progress Notes (Signed)
[facility] Return Visit  SUBJECTIVE  History of Present Illness: Alicia Crawford is a 66 y.o. female seen in follow-up for prolapse and incontinence. She would like to proceed with surgery.  Feels that she has to go to the bathroom frequently. Wakes up several times per night.   Has a history of abdominoplasty/ panniculectomy with MRSA. Takes a baby aspirin due to history of MI. Last Hgb A1c was 5.6% on 12/31/21. Had shoulder surgery in the end of March and required blood transfusion (2 units)  Urodynamic Impression:  1. Sensation was increased; capacity was reduced 2. Stress Incontinence was demonstrated at normal pressures; 3. Detrusor Overactivity was not demonstrated. 4. Emptying was normal with a normal PVR, a sustained detrusor contraction present,  abdominal straining present, normal urethral sphincter activity on EMG.  Past Medical History: Patient  has a past medical history of Ankle fracture (09/05/2016), Anoxic brain injury (Alicia Crawford) (08/2010), Anxiety (Jan. 2012), Blood transfusion (09/2010), Cardiac arrest (Alicia Crawford) (08/04/2010), Carotid artery occlusion, Claudication (Alicia Crawford), Complication of anesthesia, Coronary artery disease, Coronary artery disease, Dementia, Depression, DVT (deep venous thrombosis) (Alicia Crawford), GERD (gastroesophageal reflux disease), High cholesterol, Hypertension, Myocardial infarction (Alicia Crawford) (09/27/10), Obesity, Peripheral vascular disease (Alicia Crawford), Renal artery stenosis ([place]), Rotator cuff tendonitis, Sleep apnea, Stroke (Alicia Crawford) (2011), Subdural hematoma [facility]) (May 25,2013), and Type II diabetes mellitus (Alicia Crawford).   Past Surgical History: She  has a past surgical history that includes Ovarian cyst removal (1983); stomach and skin tuck (2000); Posterior fusion lumbar spine (` 2000); Cholecystectomy (~ 1980's); Coronary artery bypass graft (09/2010); Back surgery; Craniotomy (02/25/2012); Lower extremity angiogram (April 2013); Spine surgery; atherectomy (N/A, 01/17/2012);  ORIF toe fracture (Left, 04/25/2019); Abdominoplasty/panniculectomy; and Reverse shoulder arthroplasty (Left, 12/31/2021).   Medications: She has a current medication list which includes the following prescription(s): acetaminophen, [START ON 02/28/2022] alprazolam, amlodipine, vitamin c, aspirin ec, atorvastatin, chlorpheniramine, cholecalciferol, cilostazol, coenzyme q10, estradiol, repatha sureclick, fluticasone, loratadine, melatonin, metformin, metoprolol tartrate, ondansetron, pantoprazole, quercetin, senna-docusate, sertraline, trospium chloride, zinc gluconate, and magnesium oxide.   Allergies: Patient is allergic to halothane.   Social History: Patient  reports that she quit smoking about 18 years ago. Her smoking use included cigarettes. She has a 30.00 pack-year smoking history. She has never used smokeless tobacco. She reports that she does not currently use alcohol. She reports that she does not use drugs.      OBJECTIVE     Physical Exam: Vitals:   02/23/22 1315  BP: 134/81  Pulse: (!) 109   Gen: No apparent distress, A&O x 3.  Abdomen: transverse incision with pocket in center- no signs of infection. Midline sternal incision to epigastric area.   Detailed Urogynecologic Evaluation:  Deferred.  POP-Q (01/22/21):  Complete prolapse noted   POP-Q   3                                            Aa   7                                           Ba   8  C    7                                            Gh   4                                            Pb   8                                            tvl    3                                            Ap   7                                            Bp   -2                                              D      ASSESSMENT AND PLAN    [name] is a 66 y.o. with:  1. Prolapse of anterior vaginal wall   2. Prolapse of posterior vaginal wall   3. Uterovaginal prolapse,  complete   4. SUI (stress urinary incontinence, female)   5. Overactive bladder    - For her OAB symptoms, will start trospium '60mg'$  daily.   Plan for surgery: Exam under anesthesia, robotic assisted total laparoscopic hysterectomy with bilateral salpingectomy, sacrocolpopexy, possible posterior repair, midurethral sling, cystoscopy  - We reviewed the patient's specific anatomic and functional findings, with the assistance of diagrams, and together finalized the above procedure. The planned surgical procedures were discussed along with the surgical risks outlined below, which were also provided on a detailed handout. Additional treatment options including expectant management, conservative management, medical management were discussed where appropriate.  We reviewed the benefits and risks of each treatment option.   - For preop Visit:  She is required to have a visit within 30 days of her surgery.    - Medical clearance: required- cardiac clearance and PCP clearance. Letter sent to Dr Alicia Crawford (cardiology) and Dr Alicia Crawford (PCP) requesting risk stratification and medical optimization. Will need updated Hgb A1c and CBC pre op.  - Anticoagulant use: No - Medicaid Hysterectomy form: no - Accepts blood transfusion: Yes - Expected length of stay: outpatient  Request sent for surgery scheduling.   Jaquita Folds, MD  Time spent: I spent 30 minutes dedicated to the care of this patient on the date of this encounter to include pre-visit review of records, face-to-face time with the patient discussing surgery and post visit documentation.

## 2022-02-24 ENCOUNTER — Other Ambulatory Visit: Payer: Self-pay | Admitting: Obstetrics and Gynecology

## 2022-02-24 ENCOUNTER — Other Ambulatory Visit: Payer: Self-pay | Admitting: Cardiovascular Disease

## 2022-02-24 NOTE — Progress Notes (Unsigned)
Pt called stating trospium is too expensive. Will request to see which medications are covered.

## 2022-02-25 NOTE — Progress Notes (Signed)
Spoke to the patients daughter and she said she would relay the message to find out what's covered under her insurance.

## 2022-03-07 ENCOUNTER — Encounter: Payer: Self-pay | Admitting: Psychiatry

## 2022-03-07 ENCOUNTER — Ambulatory Visit (INDEPENDENT_AMBULATORY_CARE_PROVIDER_SITE_OTHER): Payer: PPO | Admitting: Psychiatry

## 2022-03-07 VITALS — Wt 150.0 lb

## 2022-03-07 DIAGNOSIS — F410 Panic disorder [episodic paroxysmal anxiety] without agoraphobia: Secondary | ICD-10-CM

## 2022-03-07 DIAGNOSIS — F3342 Major depressive disorder, recurrent, in full remission: Secondary | ICD-10-CM

## 2022-03-07 DIAGNOSIS — G4700 Insomnia, unspecified: Secondary | ICD-10-CM | POA: Diagnosis not present

## 2022-03-07 MED ORDER — TRAZODONE HCL 100 MG PO TABS: ORAL_TABLET | ORAL | 1 refills | Status: DC

## 2022-03-07 MED ORDER — SERTRALINE HCL 100 MG PO TABS: 100.0000 mg | ORAL_TABLET | Freq: Two times a day (BID) | ORAL | 1 refills | Status: DC

## 2022-03-07 NOTE — Progress Notes (Signed)
Alicia Crawford [id number] 12-Dec-1955 66 y.o.  Virtual Visit via Telephone Note  I connected with pt on 03/07/22 at 11:30 AM EDT by telephone and verified that I am speaking with the correct person using two identifiers.   I discussed the limitations, risks, security and privacy concerns of performing an evaluation and management service by telephone and the availability of in person appointments. I also discussed with the patient that there may be a patient responsible charge related to this service. The patient expressed understanding and agreed to proceed.   I discussed the assessment and treatment plan with the patient. The patient was provided an opportunity to ask questions and all were answered. The patient agreed with the plan and demonstrated an understanding of the instructions.   The patient was advised to call back or seek an in-person evaluation if the symptoms worsen or if the condition fails to improve as anticipated.  I provided 25 minutes of non-face-to-face time during this encounter.  The patient was located at home.  The provider was located at home.   Thayer Headings, PMHNP   Subjective:   Patient ID:  Alicia Crawford is a 66 y.o. (DOB 1956-02-10) female.  Chief Complaint:  Chief Complaint  Patient presents with   Follow-up    Anxiety, depression   Insomnia    Insomnia  Alicia Crawford presents for follow-up of anxiety, depression, and insomnia.  She reports, "I'm doing wonderful." She did not go on her trip since her sister had to go through chemotherapy. She reports that her anxiety has improved. Denies any recent panic attacks. She has been taking Alprazolam 0.5 mg 1/2 tab every other night. Denies anxiety. Denies depressed mood. Energy and motivation have been good. She has been doing housework and chores. Appetite was decreased after surgery and has started improving. She reports that her concentration is ok. She has been working some crossword puzzles and is reading.  Denies SI.   She had to have a shoulder replacement after her dog knocked her down 7-8 weeks ago. Took oxycodone immediately afterwards. She reports that her sleep is disrupted at times due to arm/shoulder pain. She reports that she has tried Trazodone and it has helped with her sleep.    Past Psychiatric Medication Trials: Xanax- Helpful Ativan Sertraline- started in 2013 Prozac- took for years and then stopped working Topamax    Review of Systems:  Review of Systems  Gastrointestinal: Negative.   Genitourinary:        Scheduled to have surgery for uterine prolapse  Musculoskeletal:  Negative for gait problem.       Recent shoulder surgery.  Neurological:  Negative for tremors.  Psychiatric/Behavioral:  The patient has insomnia.        Please refer to HPI   Medications: I have reviewed the patient's current medications.  Current Outpatient Medications  Medication Sig Dispense Refill   ALPRAZolam (XANAX) 0.5 MG tablet Take 1/2-1 tablet twice daily as needed for severe anxiety 60 tablet 2   amLODipine (NORVASC) 5 MG tablet Take 5 mg by mouth daily.     Ascorbic Acid (VITAMIN C) 100 MG tablet Take 100 mg by mouth daily.     aspirin EC 81 MG tablet Take 81 mg by mouth daily.     atorvastatin (LIPITOR) 80 MG tablet TAKE 1 TABLET BY MOUTH DAILY 90 tablet 3   chlorpheniramine (CHLOR-TRIMETON) 4 MG tablet Take 1 tablet (4 mg total) by mouth at bedtime as needed for allergies.  cholecalciferol (VITAMIN D) 25 MCG (1000 UNIT) tablet Take 1,000 Units by mouth daily.     cilostazol (PLETAL) 100 MG tablet TAKE 1 TABLET BY MOUTH TWICE DAILY BEFORE MEALS 180 tablet 0   Coenzyme Q10 300 MG CAPS Take 300 mg by mouth daily.     Evolocumab (REPATHA SURECLICK) 616 MG/ML SOAJ Inject 140 mg into the skin every 14 (fourteen) days. (Patient taking differently: Inject 140 mg into the skin every 14 (fourteen) days. Every other Sunday) 2 mL 11   fluticasone (FLONASE) 50 MCG/ACT nasal spray SHAKE LIQUID  AND USE 1 SPRAY IN EACH NOSTRIL DAILY (Patient taking differently: Place 1 spray into both nostrils daily as needed for allergies.) 16 g 3   ibuprofen (ADVIL) 200 MG tablet Take 200 mg by mouth every 6 (six) hours as needed.     loratadine (CLARITIN) 10 MG tablet TAKE 1 TABLET(10 MG) BY MOUTH DAILY (Patient taking differently: Take 10 mg by mouth daily.) 30 tablet 11   melatonin 5 MG TABS Take 5 mg by mouth at bedtime as needed (sleep).     metFORMIN (GLUCOPHAGE) 1000 MG tablet Take 1,000 mg by mouth 2 (two) times daily with a meal.  0   metoprolol tartrate (LOPRESSOR) 25 MG tablet TAKE 1 TABLET(25 MG) BY MOUTH TWICE DAILY (Patient taking differently: Take 25 mg by mouth 2 (two) times daily.) 180 tablet 3   pantoprazole (PROTONIX) 40 MG tablet TAKE 1 TABLET(40 MG) BY MOUTH DAILY (Patient taking differently: Take 40 mg by mouth daily.) 90 tablet 3   QUERCETIN PO Take 1,000 mg by mouth in the morning and at bedtime.     traZODone (DESYREL) 100 MG tablet Take 1/2-1 tablet po QHS prn insomnia 90 tablet 1   Trospium Chloride 60 MG CP24 Take 1 capsule (60 mg total) by mouth daily. 30 capsule 5   zinc gluconate 50 MG tablet Take 50 mg by mouth daily.     estradiol (ESTRACE) 0.1 MG/GM vaginal cream Place 0.5 g vaginally 2 (two) times a week. (Patient not taking: Reported on 03/07/2022)     magnesium oxide (MAG-OX) 400 MG tablet Take 400 mg by mouth 2 (two) times daily. (Patient not taking: Reported on 12/31/2021)     ondansetron (ZOFRAN) 4 MG tablet Take 1 tablet (4 mg total) by mouth every 8 (eight) hours as needed for nausea, vomiting or refractory nausea / vomiting. (Patient not taking: Reported on 03/07/2022) 30 tablet 1   senna-docusate (SENOKOT-S) 8.6-50 MG tablet Take 1 tablet by mouth at bedtime as needed for mild constipation or moderate constipation. (Patient not taking: Reported on 03/07/2022) 20 tablet 0   sertraline (ZOLOFT) 100 MG tablet Take 1 tablet (100 mg total) by mouth in the morning and at  bedtime. 180 tablet 1   No current facility-administered medications for this visit.    Medication Side Effects: None  Allergies:  Allergies  Allergen Reactions   Halothane Hives and Other (See Comments)    Gas used 1980's Reaction: affected her liver    Past Medical History:  Diagnosis Date   Ankle fracture 09/05/2016   right ankle   Anoxic brain injury [facility]) 08/2010   "w/cardiac arrest"   Anxiety Jan. 2012   Blood transfusion 09/2010   Cardiac arrest ([place]) 08/04/2010   Carotid artery occlusion    mild to moderate bilateral internal carotid artery stenosis   Claudication (HCC)    Complication of anesthesia    [place]   Coronary artery disease  CABG 2011 in [facility]   Coronary artery disease    Dementia    Depression    DVT (deep venous thrombosis) (HCC)    GERD (gastroesophageal reflux disease)    High cholesterol    Hypertension    Myocardial infarction [facility]) 09/27/10   in [place]   Obesity    Peripheral vascular disease (HCC)    bilat SFA diseae   Renal artery stenosis (HCC)    Rotator cuff tendonitis    Sleep apnea    uses CPAP nightly   Stroke [facility]) 2011   Subdural hematoma [facility]) May 25,2013   Frontal subdural  S/P evacuation of hematoma   Type II diabetes mellitus (HCC)    Diet and Exercise    Family History  Problem Relation Age of Onset   Heart disease Mother        before age 71   Diabetes Mother    Hyperlipidemia Mother    Hypertension Mother    Heart attack Mother    Heart disease Father        Before age 38   Hyperlipidemia Father    Hypertension Father    Heart attack Father    Heart disease Sister        Before age 82   Heart attack Sister    Cancer - Colon Sister    Hyperlipidemia Brother    Hypertension Brother    Heart attack Brother    Heart disease Brother        before age 6   Heart disease Other    Diabetes Other    Hypertension Other    Alcohol abuse Other    Mental illness Other     Social History    Socioeconomic History   Marital status: Married    Spouse name: Not on file   Number of children: Not on file   Years of education: Not on file   Highest education level: Not on file  Occupational History   Not on file  Tobacco Use   Smoking status: Former    Packs/day: 1.00    Years: 30.00    Pack years: 30.00    Types: Cigarettes    Quit date: 06/23/2003    Years since quitting: 18.7   Smokeless tobacco: Never  Vaping Use   Vaping Use: Not on file  Substance and Sexual Activity   Alcohol use: Not Currently    Alcohol/week: 0.0 standard drinks    Comment: 01/17/12 "used to have glass of wine now & then; last wine was ~ 2011"   Drug use: No   Sexual activity: Yes    Birth control/protection: None  Other Topics Concern   Not on file  Social History Narrative   Not on file   Social Determinants of Health   Financial Resource Strain: Not on file  Food Insecurity: Not on file  Transportation Needs: Not on file  Physical Activity: Not on file  Stress: Not on file  Social Connections: Not on file  Intimate Partner Violence: Not on file    Past Medical History, Surgical history, Social history, and Family history were reviewed and updated as appropriate.   Please see review of systems for further details on the patient's review from today.   Objective:   Physical Exam:  Wt 150 lb (68 kg)   BMI 25.75 kg/m   Physical Exam Neurological:     Mental Status: She is alert and oriented to person, place, and time.  Cranial Nerves: No dysarthria.  Psychiatric:        Attention and Perception: Attention and perception normal.        Mood and Affect: Mood normal.        Speech: Speech normal.        Behavior: Behavior is cooperative.        Thought Content: Thought content normal. Thought content is not paranoid or delusional. Thought content does not include homicidal or suicidal ideation. Thought content does not include homicidal or suicidal plan.        Cognition  and Memory: Cognition and memory normal.        Judgment: Judgment normal.     Comments: Insight intact    Lab Review:     Component Value Date/Time   NA 137 01/02/2022 0848   K 3.6 01/02/2022 0848   CL 105 01/02/2022 0848   CO2 22 01/02/2022 0848   GLUCOSE 112 (H) 01/02/2022 0848   BUN 25 (H) 01/02/2022 0848   CREATININE 1.00 01/02/2022 0848   CALCIUM 8.5 (L) 01/02/2022 0848   PROT 6.7 03/21/2021 0051   ALBUMIN 3.2 (L) 03/21/2021 0051   AST 18 03/21/2021 0051   ALT 25 03/21/2021 0051   ALKPHOS 61 03/21/2021 0051   BILITOT 0.7 03/21/2021 0051   GFRNONAA >60 01/02/2022 0848   GFRAA 60 (L) 04/22/2019 1227       Component Value Date/Time   WBC 8.8 01/02/2022 0848   RBC 3.08 (L) 01/02/2022 0848   HGB 9.5 (L) 01/02/2022 0848   HCT 28.5 (L) 01/02/2022 0848   PLT 193 01/02/2022 0848   MCV 92.5 01/02/2022 0848   MCH 30.8 01/02/2022 0848   MCHC 33.3 01/02/2022 0848   RDW 14.8 01/02/2022 0848   LYMPHSABS 2.9 03/21/2021 0051   MONOABS 0.4 03/21/2021 0051   EOSABS 0.1 03/21/2021 0051   BASOSABS 0.1 03/21/2021 0051    No results found for: POCLITH, LITHIUM   No results found for: PHENYTOIN, PHENOBARB, VALPROATE, CBMZ   .res Assessment: Plan:    Pt seen for 25 minutes and time spent discussing recent insomnia. Discussed potential benefits, risks, and side effects of Trazodone. Pt agrees to trial of Trazodone. Will start Trazodone 100 mg 1/2-1 tablet at bedtime as needed for insomnia.  Discussed that using Trazodone prn may also help with her plan to decrease Alprazolam prn since she has been primarily using it for insomnia. Discussed that Alprazolam 0.5 mg 1/2-1 tab po qd prn could still be used as needed for panic or acute anxiety.  Continue Sertraline 100 mg po BID for anxiety and depression.  Pt to follow-up in 6 months or sooner if clinically indicated.  Patient advised to contact office with any questions, adverse effects, or acute worsening in signs and  symptoms.    Soo was seen today for follow-up and insomnia.  Diagnoses and all orders for this visit:  Panic disorder -     sertraline (ZOLOFT) 100 MG tablet; Take 1 tablet (100 mg total) by mouth in the morning and at bedtime.  Recurrent major depressive disorder, in full remission ([place]) -     sertraline (ZOLOFT) 100 MG tablet; Take 1 tablet (100 mg total) by mouth in the morning and at bedtime.  Insomnia, unspecified type -     traZODone (DESYREL) 100 MG tablet; Take 1/2-1 tablet po QHS prn insomnia    Please see After Visit Summary for patient specific instructions.  Future Appointments  Date Time Provider [place]  04/13/2022  2:20 PM Jaquita Folds, MD [facility]  04/20/2022 12:30 PM Hayden Pedro, MD [facility] None  04/26/2022  1:20 PM Jaquita Folds, MD [facility]    No orders of the defined types were placed in this encounter.     -------------------------------

## 2022-03-10 DIAGNOSIS — M25512 Pain in left shoulder: Secondary | ICD-10-CM | POA: Diagnosis not present

## 2022-03-10 DIAGNOSIS — M25612 Stiffness of left shoulder, not elsewhere classified: Secondary | ICD-10-CM | POA: Diagnosis not present

## 2022-03-18 DIAGNOSIS — M25612 Stiffness of left shoulder, not elsewhere classified: Secondary | ICD-10-CM | POA: Diagnosis not present

## 2022-03-18 DIAGNOSIS — M25512 Pain in left shoulder: Secondary | ICD-10-CM | POA: Diagnosis not present

## 2022-03-21 ENCOUNTER — Encounter: Payer: Self-pay | Admitting: Cardiovascular Disease

## 2022-03-21 ENCOUNTER — Telehealth: Payer: Self-pay

## 2022-03-21 NOTE — Telephone Encounter (Signed)
Error

## 2022-03-21 NOTE — Telephone Encounter (Signed)
Attempted to call pt to set up pre-op appointment with Dr. [name]. Unable to leave voicemail. Mailbox full.

## 2022-03-22 DIAGNOSIS — E785 Hyperlipidemia, unspecified: Secondary | ICD-10-CM | POA: Diagnosis not present

## 2022-03-22 DIAGNOSIS — E1169 Type 2 diabetes mellitus with other specified complication: Secondary | ICD-10-CM | POA: Diagnosis not present

## 2022-03-22 DIAGNOSIS — R059 Cough, unspecified: Secondary | ICD-10-CM | POA: Diagnosis not present

## 2022-03-22 DIAGNOSIS — Z5181 Encounter for therapeutic drug level monitoring: Secondary | ICD-10-CM | POA: Diagnosis not present

## 2022-03-22 DIAGNOSIS — Z01810 Encounter for preprocedural cardiovascular examination: Secondary | ICD-10-CM | POA: Diagnosis not present

## 2022-03-22 DIAGNOSIS — I10 Essential (primary) hypertension: Secondary | ICD-10-CM | POA: Diagnosis not present

## 2022-03-24 ENCOUNTER — Ambulatory Visit: Payer: PPO | Admitting: Cardiovascular Disease

## 2022-03-29 DIAGNOSIS — E119 Type 2 diabetes mellitus without complications: Secondary | ICD-10-CM | POA: Diagnosis not present

## 2022-03-29 DIAGNOSIS — Z7984 Long term (current) use of oral hypoglycemic drugs: Secondary | ICD-10-CM | POA: Diagnosis not present

## 2022-03-29 DIAGNOSIS — N8110 Cystocele, unspecified: Secondary | ICD-10-CM | POA: Diagnosis not present

## 2022-04-08 ENCOUNTER — Ambulatory Visit: Payer: PPO | Admitting: Cardiovascular Disease

## 2022-04-12 DIAGNOSIS — E1169 Type 2 diabetes mellitus with other specified complication: Secondary | ICD-10-CM | POA: Diagnosis not present

## 2022-04-12 DIAGNOSIS — I10 Essential (primary) hypertension: Secondary | ICD-10-CM | POA: Diagnosis not present

## 2022-04-12 DIAGNOSIS — I129 Hypertensive chronic kidney disease with stage 1 through stage 4 chronic kidney disease, or unspecified chronic kidney disease: Secondary | ICD-10-CM | POA: Diagnosis not present

## 2022-04-12 DIAGNOSIS — F329 Major depressive disorder, single episode, unspecified: Secondary | ICD-10-CM | POA: Diagnosis not present

## 2022-04-12 DIAGNOSIS — E785 Hyperlipidemia, unspecified: Secondary | ICD-10-CM | POA: Diagnosis not present

## 2022-04-13 ENCOUNTER — Ambulatory Visit: Payer: PPO | Admitting: Obstetrics and Gynecology

## 2022-04-20 ENCOUNTER — Encounter (INDEPENDENT_AMBULATORY_CARE_PROVIDER_SITE_OTHER): Payer: PPO | Admitting: Ophthalmology

## 2022-04-20 DIAGNOSIS — E113392 Type 2 diabetes mellitus with moderate nonproliferative diabetic retinopathy without macular edema, left eye: Secondary | ICD-10-CM | POA: Diagnosis not present

## 2022-04-20 DIAGNOSIS — I10 Essential (primary) hypertension: Secondary | ICD-10-CM

## 2022-04-20 DIAGNOSIS — H43813 Vitreous degeneration, bilateral: Secondary | ICD-10-CM | POA: Diagnosis not present

## 2022-04-20 DIAGNOSIS — H35033 Hypertensive retinopathy, bilateral: Secondary | ICD-10-CM

## 2022-04-20 DIAGNOSIS — E113291 Type 2 diabetes mellitus with mild nonproliferative diabetic retinopathy without macular edema, right eye: Secondary | ICD-10-CM

## 2022-04-26 ENCOUNTER — Encounter: Payer: PPO | Admitting: Obstetrics and Gynecology

## 2022-04-26 NOTE — Progress Notes (Deleted)
[facility]   Subjective:     Chief Complaint: No chief complaint on file.  History of Present Illness: Alicia Crawford is a 66 y.o. female with stage IV pelvic organ prolapse and stress incontinence who presents for a pessary check. She is using a size 3in long stem gellhorn pessary.   She is planning surgery (Sacrocolpopexy) and we are awaiting cardiac clearance and clearance from PCP for DM. The pessary has been working well and she has no complaints. She {ACTION; IS/IS JOI:[id number]} using vaginal estrogen. She denies vaginal bleeding.  Past Medical History: Patient  has a past medical history of Ankle fracture (09/05/2016), Anoxic brain injury ([place]) (08/2010), Anxiety (Jan. 2012), Blood transfusion (09/2010), Cardiac arrest ([place]) (08/04/2010), Carotid artery occlusion, Claudication ([place]), Complication of anesthesia, Coronary artery disease, Coronary artery disease, Dementia, Depression, DVT (deep venous thrombosis) ([place]), GERD (gastroesophageal reflux disease), High cholesterol, Hypertension, Myocardial infarction ([place]) (09/27/10), Obesity, Peripheral vascular disease ([place]), Renal artery stenosis ([place]), Rotator cuff tendonitis, Sleep apnea, Stroke ([place]) (2011), Subdural hematoma [facility]) (May 25,2013), and Type II diabetes mellitus ([place]).   Past Surgical History: She  has a past surgical history that includes Ovarian cyst removal (1983); stomach and skin tuck (2000); Posterior fusion lumbar spine (` 2000); Cholecystectomy (~ 1980's); Coronary artery bypass graft (09/2010); Back surgery; Craniotomy (02/25/2012); Lower extremity angiogram (April 2013); Spine surgery; atherectomy (N/A, 01/17/2012); ORIF toe fracture (Left, 04/25/2019); Abdominoplasty/panniculectomy; and Reverse shoulder arthroplasty (Left, 12/31/2021).   Medications: She has a current medication list which includes the following prescription(s): alprazolam, amlodipine, vitamin c, aspirin ec, atorvastatin, chlorpheniramine,  cholecalciferol, cilostazol, coenzyme q10, estradiol, repatha sureclick, fluticasone, ibuprofen, loratadine, magnesium oxide, melatonin, metformin, metoprolol tartrate, ondansetron, pantoprazole, quercetin, senna-docusate, sertraline, trazodone, trospium chloride, and zinc gluconate.   Allergies: Patient is allergic to halothane.   Social History: Patient  reports that she quit smoking about 18 years ago. Her smoking use included cigarettes. She has a 30.00 pack-year smoking history. She has never used smokeless tobacco. She reports that she does not currently use alcohol. She reports that she does not use drugs.      Objective:    Physical Exam: There were no vitals taken for this visit. Gen: No apparent distress, A&O x 3. Detailed Urogynecologic Evaluation:  Pelvic Exam: Normal external female genitalia; Bartholin's and Skene's glands normal in appearance; urethral meatus {urethra:24773}, no urethral masses or discharge. The pessary was noted to be {in place:24774}. It was removed and cleaned. Speculum exam revealed {vaginal lesions:24775} in the vagina. The pessary was replaced. It was comfortable to the patient and fit well.   POP-Q (01/22/21):  Complete prolapse noted   POP-Q   3                                            Aa   7                                           Ba   8                                              C    7  Gh   4                                            Pb   8                                            tvl    3                                            Ap   7                                            Bp   -2                                              D        Assessment/Plan:    Assessment: Alicia Crawford is a 66 y.o. with stage IV pelvic organ prolapse here for a pessary check. She is doing well.  Plan: She will {pessary plan:24776}. She will continue to use {lubricant:24777}. She will follow-up  in *** {days/wks/mos/yrs:310907} for a pessary check or sooner as needed.  All questions were answered.   Time Spent:

## 2022-05-02 ENCOUNTER — Ambulatory Visit (INDEPENDENT_AMBULATORY_CARE_PROVIDER_SITE_OTHER): Payer: PPO | Admitting: Obstetrics and Gynecology

## 2022-05-02 ENCOUNTER — Encounter: Payer: Self-pay | Admitting: Obstetrics and Gynecology

## 2022-05-02 VITALS — BP 125/79 | HR 80

## 2022-05-02 DIAGNOSIS — N813 Complete uterovaginal prolapse: Secondary | ICD-10-CM | POA: Diagnosis not present

## 2022-05-02 NOTE — Progress Notes (Signed)
Alicia Crawford   Subjective:     Chief Complaint:  Chief Complaint  Patient presents with   Pessary Check   History of Present Illness: Alicia Crawford is a 66 y.o. female with stage IV pelvic organ prolapse and stress incontinence who presents for a pessary check. She is using a size 3in short stem gellhorn pessary.   She is planning surgery (Sacrocolpopexy) and we are awaiting cardiac clearance and clearance from PCP for DM. The pessary has been working well and she has no complaints.  She denies vaginal bleeding.  Past Medical History: Patient  has a past medical history of Ankle fracture (09/05/2016), Anoxic brain injury ([place]) (08/2010), Anxiety (Jan. 2012), Blood transfusion (09/2010), Cardiac arrest ([place]) (08/04/2010), Carotid artery occlusion, Claudication ([place]), Complication of anesthesia, Coronary artery disease, Coronary artery disease, Dementia, Depression, DVT (deep venous thrombosis) ([place]), GERD (gastroesophageal reflux disease), High cholesterol, Hypertension, Myocardial infarction ([place]) (09/27/10), Obesity, Peripheral vascular disease ([place]), Renal artery stenosis ([place]), Rotator cuff tendonitis, Sleep apnea, Stroke ([place]) (2011), Subdural hematoma [facility]) (May 25,2013), and Type II diabetes mellitus ([place]).   Past Surgical History: She  has a past surgical history that includes Ovarian cyst removal (1983); stomach and skin tuck (2000); Posterior fusion lumbar spine (` 2000); Cholecystectomy (~ 1980's); Coronary artery bypass graft (09/2010); Back surgery; Craniotomy (02/25/2012); Lower extremity angiogram (April 2013); Spine surgery; atherectomy (N/A, 01/17/2012); ORIF toe fracture (Left, 04/25/2019); Abdominoplasty/panniculectomy; and Reverse shoulder arthroplasty (Left, 12/31/2021).   Medications: She has a current medication list which includes the following prescription(s): alprazolam, amlodipine, vitamin c, aspirin ec, atorvastatin, chlorpheniramine, cholecalciferol,  cilostazol, coenzyme q10, estradiol, repatha sureclick, fluticasone, ibuprofen, loratadine, melatonin, metformin, metoprolol tartrate, ondansetron, pantoprazole, quercetin, senna-docusate, sertraline, trazodone, trospium chloride, zinc gluconate, and magnesium oxide.   Allergies: Patient is allergic to halothane.   Social History: Patient  reports that she quit smoking about 18 years ago. Her smoking use included cigarettes. She has a 30.00 pack-year smoking history. She has never used smokeless tobacco. She reports that she does not currently use alcohol. She reports that she does not use drugs.      Objective:    Physical Exam: BP 125/79   Pulse 80  Gen: No apparent distress, A&O x 3. Detailed Urogynecologic Evaluation:  Pelvic Exam: Normal external female genitalia; Bartholin's and Skene's glands normal in appearance; urethral meatus normal in appearance, no urethral masses or discharge. The pessary was noted to be in place. It was removed and cleaned. Speculum exam revealed no lesions in the vagina. The pessary was replaced. It was comfortable to the patient and fit well.   POP-Q (01/22/21):  Complete prolapse noted   POP-Q   3                                            Aa   7                                           Ba   8                                              C  7                                            Gh   4                                            Pb   8                                            tvl    3                                            Ap   7                                            Bp   -2                                              D        Assessment/Plan:    Assessment: Alicia Crawford is a 66 y.o. with stage IV pelvic organ prolapse here for a pessary check.   Plan: - Has an appt with cardiologist on 8/15 and a procedure scheduled 8/31. Have not had reply for clearance from PCP for DM and updated labs- will reach out again.  -  Can plan to schedule procedure for September. Previously discussed: Exam under anesthesia, robotic assisted total laparoscopic hysterectomy with bilateral salpingectomy, sacrocolpopexy, possible posterior repair, midurethral sling, cystoscopy - Will have her return for pre op closer to surgery date  Alicia Folds, MD  Time spent: I spent 20 minutes dedicated to the care of this patient on the date of this encounter to include pre-visit review of records, face-to-face time with the patient  and post visit documentation.

## 2022-05-10 ENCOUNTER — Other Ambulatory Visit: Payer: Self-pay | Admitting: Cardiovascular Disease

## 2022-05-11 ENCOUNTER — Other Ambulatory Visit: Payer: Self-pay | Admitting: Emergency Medicine

## 2022-05-17 ENCOUNTER — Ambulatory Visit: Payer: PPO | Admitting: Cardiovascular Disease

## 2022-05-17 ENCOUNTER — Encounter: Payer: Self-pay | Admitting: Cardiovascular Disease

## 2022-05-17 DIAGNOSIS — E782 Mixed hyperlipidemia: Secondary | ICD-10-CM

## 2022-05-17 DIAGNOSIS — I739 Peripheral vascular disease, unspecified: Secondary | ICD-10-CM

## 2022-05-17 DIAGNOSIS — I2510 Atherosclerotic heart disease of native coronary artery without angina pectoris: Secondary | ICD-10-CM

## 2022-05-17 NOTE — Assessment & Plan Note (Signed)
History of peripheral arterial disease with known occluded SFAs bilaterally with two-vessel runoff on the right and 1 on the left via peroneal.  She did have a 95% calcified ostial profunda femoris stenosis.  I initially referred her to Dr. [name] for surgical revascularization but ultimately was decided to treat her medically.  She does complain of some lifestyle-limiting claudication but this has remained stable.

## 2022-05-17 NOTE — Assessment & Plan Note (Signed)
History of CAD status post witnessed cardiac arrest with CPR and defibrillation ultimately quiring coronary artery bypass grafting in [facility] 09/27/2010.  She had a prolonged hospitalization on a ventilator and rehab.  Echo performed April 2012 showed normal LV systolic function and Myoview was normal as well.  She is active and denies chest pain or shortness of breath.

## 2022-05-17 NOTE — Assessment & Plan Note (Signed)
History of hyperlipidemia on high-dose statin therapy with lipid profile performed 03/02/2021 revealing total cholesterol 118, LDL 50 and HDL 50.

## 2022-05-17 NOTE — Progress Notes (Signed)
05/17/2022 Alicia Crawford   12/26/1955  [id number]  Primary Physician London Pepper, MD Primary Cardiologist: Lorretta Harp MD FACP, [place], [place], [place]  HPI:  Alicia Crawford is a 66 y.o.  moderately overweight, married Caucasian female, mother of 3 who I last saw in the office 04/07/2021.  She has a history of remote tobacco abuse having quit 10 years ago, hypertension, hyperlipidemia and a family history of heart disease with both mother and father who had CAD and a brother who had a stent. She had an episode of witnessed cardiac arrest, CPR, defibrillation and ultimately coronary artery bypass grafting in [facility] September 27, 2010. She had prolonged hospitalization on a ventilator and rehab. Echo performed April 2012 showed normal LV systolic function. A Myoview was normal as well. She otherwise is asymptomatic except for lifestyle-limiting claudication. Dopplers performed in our office December 06, 2011, revealed ABIs of 0.46 on the right with an occluded SFA and high-grade distal right common, profunda stenosis, left ABI of 0.62 with an occluded left SFA. I angiogram'd her January 17, 2012, demonstrating occluded SFAs bilaterally with 2-vessel runoff on the right, 1 on the left via peroneal. She did have a 95% calcified ostial profunda femoris stenosis on the right jeopardizing collaterals to her infrapopliteal vessels as well as a 60% distal left common femoral artery stenosis. She also had an incidentally noted 35% right renal artery stenosis. I reviewed the films with Dr. [name] who felt surgical revascularization was optimal. She in the interim developed a subdural hematoma after falling and hitting her head and her workup was put on hold.  She later saw Dr. [name] back in the office who recommended continued medical therapy.  She does have severe bladder prolapse and has been hospitalized recently for UTI and metabolic abnormalities.  She wishes to have prolapse surgery.  She continues to deny  chest pain, shortness of breath or claudication.    Since I saw her a year ago she did fracture her left humerus and had shoulder replacement approximately 3 to 4 months ago.  She is slowly recovering from this.  She has transition to up all plant-based diet and has lost 30 pounds over the last year.   Current Meds  Medication Sig   ALPRAZolam (XANAX) 0.5 MG tablet Take 1/2-1 tablet twice daily as needed for severe anxiety   amLODipine (NORVASC) 5 MG tablet Take 5 mg by mouth daily.   Ascorbic Acid (VITAMIN C) 100 MG tablet Take 100 mg by mouth daily.   aspirin EC 81 MG tablet Take 81 mg by mouth daily.   atorvastatin (LIPITOR) 80 MG tablet TAKE 1 TABLET BY MOUTH DAILY   chlorpheniramine (CHLOR-TRIMETON) 4 MG tablet Take 1 tablet (4 mg total) by mouth at bedtime as needed for allergies.   cholecalciferol (VITAMIN D) 25 MCG (1000 UNIT) tablet Take 1,000 Units by mouth daily.   cilostazol (PLETAL) 100 MG tablet TAKE 1 TABLET BY MOUTH TWICE DAILY BEFORE MEALS   Coenzyme Q10 300 MG CAPS Take 300 mg by mouth daily.   estradiol (ESTRACE) 0.1 MG/GM vaginal cream Place 0.5 g vaginally 2 (two) times a week.   Evolocumab (REPATHA SURECLICK) 347 MG/ML SOAJ Inject 140 mg into the skin every 14 (fourteen) days. (Patient taking differently: Inject 140 mg into the skin every 14 (fourteen) days. Every other Sunday)   fluticasone (FLONASE) 50 MCG/ACT nasal spray SHAKE LIQUID AND USE 1 SPRAY IN EACH NOSTRIL DAILY (Patient taking differently: Place 1  spray into both nostrils daily as needed for allergies.)   ibuprofen (ADVIL) 200 MG tablet Take 200 mg by mouth every 6 (six) hours as needed.   loratadine (CLARITIN) 10 MG tablet TAKE 1 TABLET(10 MG) BY MOUTH DAILY (Patient taking differently: Take 10 mg by mouth daily.)   magnesium oxide (MAG-OX) 400 MG tablet Take 400 mg by mouth 2 (two) times daily.   melatonin 5 MG TABS Take 5 mg by mouth at bedtime as needed (sleep).   metFORMIN (GLUCOPHAGE) 1000 MG tablet  Take 1,000 mg by mouth 2 (two) times daily with a meal.   metoprolol tartrate (LOPRESSOR) 25 MG tablet TAKE 1 TABLET(25 MG) BY MOUTH TWICE DAILY (Patient taking differently: Take 25 mg by mouth 2 (two) times daily.)   ondansetron (ZOFRAN) 4 MG tablet Take 1 tablet (4 mg total) by mouth every 8 (eight) hours as needed for nausea, vomiting or refractory nausea / vomiting.   pantoprazole (PROTONIX) 40 MG tablet TAKE 1 TABLET(40 MG) BY MOUTH DAILY (Patient taking differently: Take 40 mg by mouth daily.)   QUERCETIN PO Take 1,000 mg by mouth in the morning and at bedtime.   senna-docusate (SENOKOT-S) 8.6-50 MG tablet Take 1 tablet by mouth at bedtime as needed for mild constipation or moderate constipation.   sertraline (ZOLOFT) 100 MG tablet Take 1 tablet (100 mg total) by mouth in the morning and at bedtime.   traZODone (DESYREL) 100 MG tablet Take 1/2-1 tablet po QHS prn insomnia   Trospium Chloride 60 MG CP24 Take 1 capsule (60 mg total) by mouth daily.   zinc gluconate 50 MG tablet Take 50 mg by mouth daily.     Allergies  Allergen Reactions   Halothane Hives and Other (See Comments)    Gas used 1980's Reaction: affected her liver    Social History   Socioeconomic History   Marital status: Married    Spouse name: Not on file   Number of children: Not on file   Years of education: Not on file   Highest education level: Not on file  Occupational History   Not on file  Tobacco Use   Smoking status: Former    Packs/day: 1.00    Years: 30.00    Total pack years: 30.00    Types: Cigarettes    Quit date: 06/23/2003    Years since quitting: 18.9   Smokeless tobacco: Never  Vaping Use   Vaping Use: Not on file  Substance and Sexual Activity   Alcohol use: Not Currently    Alcohol/week: 0.0 standard drinks of alcohol    Comment: 01/17/12 "used to have glass of wine now & then; last wine was ~ 2011"   Drug use: No   Sexual activity: Yes    Birth control/protection: None  Other  Topics Concern   Not on file  Social History Narrative   Not on file   Social Determinants of Health   Financial Resource Strain: Not on file  Food Insecurity: Not on file  Transportation Needs: Not on file  Physical Activity: Not on file  Stress: Not on file  Social Connections: Not on file  Intimate Partner Violence: Not on file     Review of Systems: General: negative for chills, fever, night sweats or weight changes.  Cardiovascular: negative for chest pain, dyspnea on exertion, edema, orthopnea, palpitations, paroxysmal nocturnal dyspnea or shortness of breath Dermatological: negative for rash Respiratory: negative for cough or wheezing Urologic: negative for hematuria Abdominal: negative for nausea,  vomiting, diarrhea, bright red blood per rectum, melena, or hematemesis Neurologic: negative for visual changes, syncope, or dizziness All other systems reviewed and are otherwise negative except as noted above.    Blood pressure 110/66, pulse 80, height '5\' 4"'$  (1.626 m), weight 145 lb 12.8 oz (66.1 kg), SpO2 98 %.  General appearance: alert and no distress Neck: no adenopathy, no JVD, supple, symmetrical, trachea midline, thyroid not enlarged, symmetric, no tenderness/mass/nodules, and soft bilateral carotid bruits Lungs: clear to auscultation bilaterally Heart: regular rate and rhythm, S1, S2 normal, no murmur, click, rub or gallop Extremities: extremities normal, atraumatic, no cyanosis or edema Pulses: Diminished pedal pulses Skin: Skin color, texture, turgor normal. No rashes or lesions Neurologic: Grossly normal  EKG sinus rhythm at 80 with nonspecific ST and T wave changes.  Personally reviewed this EKG.  ASSESSMENT AND PLAN:   Peripheral artery disease, bilta LE PVD by angio April 2013 History of peripheral arterial disease with known occluded SFAs bilaterally with two-vessel runoff on the right and 1 on the left via peroneal.  She did have a 95% calcified ostial  profunda femoris stenosis.  I initially referred her to Dr. [name] for surgical revascularization but ultimately was decided to treat her medically.  She does complain of some lifestyle-limiting claudication but this has remained stable.  HLD (hyperlipidemia) History of hyperlipidemia on high-dose statin therapy with lipid profile performed 03/02/2021 revealing total cholesterol 118, LDL 50 and HDL 50.  CAD, cardiac arrest, CPR, CABG in [facility] Dec 2011-NL LVF by Echo, neg Nuc April 2013 History of CAD status post witnessed cardiac arrest with CPR and defibrillation ultimately quiring coronary artery bypass grafting in [facility] 09/27/2010.  She had a prolonged hospitalization on a ventilator and rehab.  Echo performed April 2012 showed normal LV systolic function and Myoview was normal as well.  She is active and denies chest pain or shortness of breath.  Occlusion and stenosis of carotid artery without mention of cerebral infarction History of moderate bilateral ICA stenosis by duplex ultrasound 06/02/2021.  This will be repeated this month.     Lorretta Harp MD FACP,FACC,FAHA, [facility] 05/17/2022 2:45 PM

## 2022-05-17 NOTE — Assessment & Plan Note (Signed)
History of moderate bilateral ICA stenosis by duplex ultrasound 06/02/2021.  This will be repeated this month.

## 2022-05-17 NOTE — Patient Instructions (Signed)

## 2022-05-25 ENCOUNTER — Encounter (HOSPITAL_BASED_OUTPATIENT_CLINIC_OR_DEPARTMENT_OTHER): Payer: Self-pay | Admitting: Obstetrics and Gynecology

## 2022-05-25 ENCOUNTER — Encounter: Payer: Self-pay | Admitting: Obstetrics and Gynecology

## 2022-05-25 ENCOUNTER — Other Ambulatory Visit: Payer: Self-pay | Admitting: Emergency Medicine

## 2022-05-25 ENCOUNTER — Ambulatory Visit (INDEPENDENT_AMBULATORY_CARE_PROVIDER_SITE_OTHER): Payer: PPO | Admitting: Obstetrics and Gynecology

## 2022-05-25 VITALS — BP 155/74 | HR 88

## 2022-05-25 DIAGNOSIS — N816 Rectocele: Secondary | ICD-10-CM | POA: Diagnosis not present

## 2022-05-25 DIAGNOSIS — N813 Complete uterovaginal prolapse: Secondary | ICD-10-CM | POA: Diagnosis not present

## 2022-05-25 DIAGNOSIS — N393 Stress incontinence (female) (male): Secondary | ICD-10-CM

## 2022-05-25 DIAGNOSIS — N8110 Cystocele, unspecified: Secondary | ICD-10-CM

## 2022-05-25 DIAGNOSIS — Z01818 Encounter for other preprocedural examination: Secondary | ICD-10-CM

## 2022-05-25 MED ORDER — OXYCODONE HCL 5 MG PO TABS: 5.0000 mg | ORAL_TABLET | ORAL | 0 refills | Status: DC | PRN

## 2022-05-25 MED ORDER — POLYETHYLENE GLYCOL 3350 17 GM/SCOOP PO POWD: 17.0000 g | Freq: Every day | ORAL | 0 refills | Status: DC

## 2022-05-25 MED ORDER — ACETAMINOPHEN 500 MG PO TABS: 500.0000 mg | ORAL_TABLET | Freq: Four times a day (QID) | ORAL | 0 refills | Status: DC | PRN

## 2022-05-25 MED ORDER — IBUPROFEN 600 MG PO TABS: 600.0000 mg | ORAL_TABLET | Freq: Four times a day (QID) | ORAL | 0 refills | Status: DC | PRN

## 2022-05-25 NOTE — H&P (Signed)
[facility] Pre-Operative H&P  Subjective Chief Complaint: Alicia Crawford presents for a preoperative encounter.   History of Present Illness: Alicia Crawford is a 66 y.o. female who presents for preoperative visit.  She is scheduled to undergo Exam under anesthesia, robotic assisted total laparoscopic hysterectomy with bilateral salpingo-oophorectomy, sacrocolpopexy, possible posterior repair, midurethral sling, cystoscopy on 06/14/22.  Her symptoms include prolapse and incontinence, and she was was found to have Stage IV anterior, Stage IV posterior, Stage IV apical prolapse.  Urodynamics showed: 1. Sensation was increased; capacity was reduced 2. Stress Incontinence was demonstrated at normal pressures; 3. Detrusor Overactivity was not demonstrated. 4. Emptying was normal with a normal PVR, a sustained detrusor contraction present,  abdominal straining present, normal urethral sphincter activity on EMG.  Past Medical History:  Diagnosis Date   Ankle fracture 09/05/2016   right ankle   Anoxic brain injury ([place]) 08/2010   "w/cardiac arrest"   Anxiety 10/2010   panic disorder   Bilateral hypertensive retinopathy 2020   Blood transfusion 09/2010   Cardiac arrest ([place]) 08/04/2010   witnessed cardiac arrest, with CPR & defibrillation, and ultimately coronary artery bypass grafting on 09/27/2010. She had a prolonged hospitalization on a ventilalator and rehab.   Carotid artery occlusion    mild to moderate bilateral internal carotid artery stenosis   Claudication [facility])    life-style limiting claudication   Complication of anesthesia    [place]   Coronary artery disease    CABG 2011 in [place], Pt follows with Dr. Quay Burow, cardiologist, [place] 05/17/22 in [place].   Dementia    Depression    DVT (deep venous thrombosis) (HCC)    GERD (gastroesophageal reflux disease)    High cholesterol    Hyperkalemia 03/2021   03/18/21 K+ 6.5   Hypertension    Myocardial infarction  [facility]) 09/27/2010   in [place]   Obesity    Peripheral vascular disease ([place])    bilat SFA diseae   Renal artery stenosis (HCC)    35% right renal artery stenosis, revascularization was recommended, however patient fell and developed a subdural hematoma, workup was put on hold, medical therapy was recommended per 05/17/22 cardiology OV note in Epic from Dr. Quay Burow.   Rotator cuff tendonitis    Sleep apnea    uses CPAP nightly   Stroke [facility]) 2011   Subdural hematoma ([place]) 02/25/2012   Frontal subdural  S/P evacuation of hematoma   Type II diabetes mellitus (HCC)    Diet and Exercise   UTI (urinary tract infection) 03/2021   hospitalized for acute kidney injury, was found to have a Klebsiella UTI     Past Surgical History:  Procedure Laterality Date   ABDOMINOPLASTY/PANNICULECTOMY     Had wound complication with MRSA after   ATHERECTOMY N/A 01/17/2012   Procedure: ATHERECTOMY;  Surgeon: Lorretta Harp, MD;  Location: [facility] CATH LAB;  Service: Cardiovascular;  Laterality: N/A;   BACK SURGERY     CHOLECYSTECTOMY  ~ 1980's   CORONARY ARTERY BYPASS GRAFT  09/2010   CABG X4   CRANIOTOMY  02/25/2012   Procedure: CRANIOTOMY HEMATOMA EVACUATION SUBDURAL;  Surgeon: Eustace Moore, MD;  Location: [place] NEURO ORS;  Service: Neurosurgery;  Laterality: Left;  Left craniotomy for evacuation of subdural hematoma   LOWER EXTREMITY ANGIOGRAM  April 2013   ORIF TOE FRACTURE Left 04/25/2019   Procedure: Open Reduction Internal Fixation (ORIF) Left Fifth Metatarsal Ronnald Ramp Fracture;  Surgeon: Wylene Simmer, MD;  Location: [place];  Service: Orthopedics;  Laterality: Left;   OVARIAN CYST REMOVAL  1983   POSTERIOR FUSION LUMBAR SPINE  ` 2000   REVERSE SHOULDER ARTHROPLASTY Left 12/31/2021   Procedure: REVERSE SHOULDER ARTHROPLASTY;  Surgeon: Netta Cedars, MD;  Location: [facility] ORS;  Service: Orthopedics;  Laterality: Left;   SPINE SURGERY     stomach and skin tuck  2000    is  allergic to halothane.   Family History  Problem Relation Age of Onset   Heart disease Mother        before age 30   Diabetes Mother    Hyperlipidemia Mother    Hypertension Mother    Heart attack Mother    Heart disease Father        Before age 86   Hyperlipidemia Father    Hypertension Father    Heart attack Father    Heart disease Sister        Before age 72   Heart attack Sister    Cancer - Colon Sister    Hyperlipidemia Brother    Hypertension Brother    Heart attack Brother    Heart disease Brother        before age 63   Heart disease Other    Diabetes Other    Hypertension Other    Alcohol abuse Other    Mental illness Other     Social History   Tobacco Use   Smoking status: Former    Packs/day: 1.00    Years: 30.00    Total pack years: 30.00    Types: Cigarettes    Quit date: 06/23/2003    Years since quitting: 18.9   Smokeless tobacco: Never  Substance Use Topics   Alcohol use: Not Currently    Alcohol/week: 0.0 standard drinks of alcohol    Comment: 01/17/12 "used to have glass of wine now & then; last wine was ~ 2011"   Drug use: No     Review of Systems was negative for a full 10 system review except as noted in the History of Present Illness.  No current facility-administered medications for this encounter.  Current Outpatient Medications:    acetaminophen (TYLENOL) 500 MG tablet, Take 1 tablet (500 mg total) by mouth every 6 (six) hours as needed (pain)., Disp: 30 tablet, Rfl: 0   ALPRAZolam (XANAX) 0.5 MG tablet, Take 1/2-1 tablet twice daily as needed for severe anxiety, Disp: 60 tablet, Rfl: 2   amLODipine (NORVASC) 5 MG tablet, Take 5 mg by mouth daily., Disp: , Rfl:    Ascorbic Acid (VITAMIN C) 100 MG tablet, Take 100 mg by mouth daily., Disp: , Rfl:    aspirin EC 81 MG tablet, Take 81 mg by mouth daily., Disp: , Rfl:    atorvastatin (LIPITOR) 80 MG tablet, TAKE 1 TABLET BY MOUTH DAILY, Disp: 90 tablet, Rfl: 3   chlorpheniramine  (CHLOR-TRIMETON) 4 MG tablet, Take 1 tablet (4 mg total) by mouth at bedtime as needed for allergies., Disp: , Rfl:    cholecalciferol (VITAMIN D) 25 MCG (1000 UNIT) tablet, Take 1,000 Units by mouth daily., Disp: , Rfl:    cilostazol (PLETAL) 100 MG tablet, TAKE 1 TABLET BY MOUTH TWICE DAILY BEFORE MEALS, Disp: 180 tablet, Rfl: 0   Coenzyme Q10 300 MG CAPS, Take 300 mg by mouth daily., Disp: , Rfl:    estradiol (ESTRACE) 0.1 MG/GM vaginal cream, Place 0.5 g vaginally 2 (two) times a week., Disp: , Rfl:  Evolocumab (REPATHA SURECLICK) 937 MG/ML SOAJ, Inject 140 mg into the skin every 14 (fourteen) days. (Patient taking differently: Inject 140 mg into the skin every 14 (fourteen) days. Every other Sunday), Disp: 2 mL, Rfl: 11   fluticasone (FLONASE) 50 MCG/ACT nasal spray, SHAKE LIQUID AND USE 1 SPRAY IN EACH NOSTRIL DAILY (Patient taking differently: Place 1 spray into both nostrils daily as needed for allergies.), Disp: 16 g, Rfl: 3   ibuprofen (ADVIL) 200 MG tablet, Take 200 mg by mouth every 6 (six) hours as needed., Disp: , Rfl:    ibuprofen (ADVIL) 600 MG tablet, Take 1 tablet (600 mg total) by mouth every 6 (six) hours as needed., Disp: 30 tablet, Rfl: 0   loratadine (CLARITIN) 10 MG tablet, TAKE 1 TABLET(10 MG) BY MOUTH DAILY (Patient taking differently: Take 10 mg by mouth daily.), Disp: 30 tablet, Rfl: 11   magnesium oxide (MAG-OX) 400 MG tablet, Take 400 mg by mouth 2 (two) times daily., Disp: , Rfl:    melatonin 5 MG TABS, Take 5 mg by mouth at bedtime as needed (sleep)., Disp: , Rfl:    metFORMIN (GLUCOPHAGE) 1000 MG tablet, Take 1,000 mg by mouth 2 (two) times daily with a meal., Disp: , Rfl: 0   metoprolol tartrate (LOPRESSOR) 25 MG tablet, TAKE 1 TABLET(25 MG) BY MOUTH TWICE DAILY (Patient taking differently: Take 25 mg by mouth 2 (two) times daily.), Disp: 180 tablet, Rfl: 3   ondansetron (ZOFRAN) 4 MG tablet, Take 1 tablet (4 mg total) by mouth every 8 (eight) hours as needed for  nausea, vomiting or refractory nausea / vomiting., Disp: 30 tablet, Rfl: 1   oxyCODONE (OXY IR/ROXICODONE) 5 MG immediate release tablet, Take 1 tablet (5 mg total) by mouth every 4 (four) hours as needed for severe pain., Disp: 10 tablet, Rfl: 0   pantoprazole (PROTONIX) 40 MG tablet, TAKE 1 TABLET(40 MG) BY MOUTH DAILY (Patient taking differently: Take 40 mg by mouth daily.), Disp: 90 tablet, Rfl: 3   polyethylene glycol powder (GLYCOLAX/MIRALAX) 17 GM/SCOOP powder, Take 17 g by mouth daily. Drink 17g (1 scoop) dissolved in water per day., Disp: 255 g, Rfl: 0   QUERCETIN PO, Take 1,000 mg by mouth in the morning and at bedtime., Disp: , Rfl:    senna-docusate (SENOKOT-S) 8.6-50 MG tablet, Take 1 tablet by mouth at bedtime as needed for mild constipation or moderate constipation., Disp: 20 tablet, Rfl: 0   sertraline (ZOLOFT) 100 MG tablet, Take 1 tablet (100 mg total) by mouth in the morning and at bedtime., Disp: 180 tablet, Rfl: 1   traZODone (DESYREL) 100 MG tablet, Take 1/2-1 tablet po QHS prn insomnia, Disp: 90 tablet, Rfl: 1   Trospium Chloride 60 MG CP24, Take 1 capsule (60 mg total) by mouth daily., Disp: 30 capsule, Rfl: 5   zinc gluconate 50 MG tablet, Take 50 mg by mouth daily., Disp: , Rfl:    Objective There were no vitals filed for this visit.   Gen: NAD CV: S1 S2 RRR Lungs: Clear to auscultation bilaterally Abd: soft, nontender  Previous Pelvic Exam showed: POP-Q (01/22/21):  Complete prolapse noted   POP-Q   3                                            Aa   7  Ba   8                                              C    7                                            Gh   4                                            Pb   8                                            tvl    3                                            Ap   7                                            Bp   -2       Assessment/ Plan  The patient is a 66  y.o. year old with Stage IV POP and SUI scheduled to undergo Exam under anesthesia, robotic assisted total laparoscopic hysterectomy with bilateral salpingo-oophorectomy, sacrocolpopexy, possible posterior repair, midurethral sling, cystoscopy.    Jaquita Folds, MD

## 2022-05-25 NOTE — Progress Notes (Signed)
[facility] Pre-Operative visit  Subjective Chief Complaint: Alicia Crawford presents for a preoperative encounter.   History of Present Illness: Alicia Crawford is a 66 y.o. female who presents for preoperative visit.  She is scheduled to undergo Exam under anesthesia, robotic assisted total laparoscopic hysterectomy with bilateral salpingo-oophorectomy, sacrocolpopexy, possible posterior repair, midurethral sling, cystoscopy on 06/14/22.  Her symptoms include prolapse and incontinence, and she was was found to have Stage IV anterior, Stage IV posterior, Stage IV apical prolapse.  Urodynamics showed: 1. Sensation was increased; capacity was reduced 2. Stress Incontinence was demonstrated at normal pressures; 3. Detrusor Overactivity was not demonstrated. 4. Emptying was normal with a normal PVR, a sustained detrusor contraction present,  abdominal straining present, normal urethral sphincter activity on EMG.  Past Medical History:  Diagnosis Date   Ankle fracture 09/05/2016   right ankle   Anoxic brain injury ([place]) 08/2010   "w/cardiac arrest"   Anxiety 10/2010   panic disorder   Bilateral hypertensive retinopathy 2020   Blood transfusion 09/2010   Cardiac arrest ([place]) 08/04/2010   witnessed cardiac arrest, with CPR & defibrillation, and ultimately coronary artery bypass grafting on 09/27/2010. She had a prolonged hospitalization on a ventilalator and rehab.   Carotid artery occlusion    mild to moderate bilateral internal carotid artery stenosis   Claudication [facility])    life-style limiting claudication   Complication of anesthesia    [place]   Coronary artery disease    CABG 2011 in [place], Pt follows with Dr. Quay Burow, cardiologist, [place] 05/17/22 in [place].   Dementia    Depression    DVT (deep venous thrombosis) (HCC)    GERD (gastroesophageal reflux disease)    High cholesterol    Hyperkalemia 03/2021   03/18/21 K+ 6.5   Hypertension    Myocardial  infarction [facility]) 09/27/2010   in [place]   Obesity    Peripheral vascular disease ([place])    bilat SFA diseae   Renal artery stenosis (HCC)    35% right renal artery stenosis, revascularization was recommended, however patient fell and developed a subdural hematoma, workup was put on hold, medical therapy was recommended per 05/17/22 cardiology OV note in Epic from Dr. Quay Burow.   Rotator cuff tendonitis    Sleep apnea    uses CPAP nightly   Stroke [facility]) 2011   Subdural hematoma ([place]) 02/25/2012   Frontal subdural  S/P evacuation of hematoma   Type II diabetes mellitus (HCC)    Diet and Exercise   UTI (urinary tract infection) 03/2021   hospitalized for acute kidney injury, was found to have a Klebsiella UTI     Past Surgical History:  Procedure Laterality Date   ABDOMINOPLASTY/PANNICULECTOMY     Had wound complication with MRSA after   ATHERECTOMY N/A 01/17/2012   Procedure: ATHERECTOMY;  Surgeon: Lorretta Harp, MD;  Location: [facility] CATH LAB;  Service: Cardiovascular;  Laterality: N/A;   BACK SURGERY     CHOLECYSTECTOMY  ~ 1980's   CORONARY ARTERY BYPASS GRAFT  09/2010   CABG X4   CRANIOTOMY  02/25/2012   Procedure: CRANIOTOMY HEMATOMA EVACUATION SUBDURAL;  Surgeon: Eustace Moore, MD;  Location: [facility] NEURO ORS;  Service: Neurosurgery;  Laterality: Left;  Left craniotomy for evacuation of subdural hematoma   LOWER EXTREMITY ANGIOGRAM  April 2013   ORIF TOE FRACTURE Left 04/25/2019   Procedure: Open Reduction Internal Fixation (ORIF) Left Fifth Metatarsal Ronnald Ramp Fracture;  Surgeon: Wylene Simmer, MD;  Location: [facility];  Service: Orthopedics;  Laterality: Left;   OVARIAN CYST REMOVAL  1983   POSTERIOR FUSION LUMBAR SPINE  ` 2000   REVERSE SHOULDER ARTHROPLASTY Left 12/31/2021   Procedure: REVERSE SHOULDER ARTHROPLASTY;  Surgeon: Netta Cedars, MD;  Location: [facility] ORS;  Service: Orthopedics;  Laterality: Left;   SPINE SURGERY     stomach and skin tuck  2000     is allergic to [place].   Family History  Problem Relation Age of Onset   Heart disease Mother        before age 46   Diabetes Mother    Hyperlipidemia Mother    Hypertension Mother    Heart attack Mother    Heart disease Father        Before age 70   Hyperlipidemia Father    Hypertension Father    Heart attack Father    Heart disease Sister        Before age 30   Heart attack Sister    Cancer - Colon Sister    Hyperlipidemia Brother    Hypertension Brother    Heart attack Brother    Heart disease Brother        before age 78   Heart disease Other    Diabetes Other    Hypertension Other    Alcohol abuse Other    Mental illness Other     Social History   Tobacco Use   Smoking status: Former    Packs/day: 1.00    Years: 30.00    Total pack years: 30.00    Types: Cigarettes    Quit date: 06/23/2003    Years since quitting: 18.9   Smokeless tobacco: Never  Substance Use Topics   Alcohol use: Not Currently    Alcohol/week: 0.0 standard drinks of alcohol    Comment: 01/17/12 "used to have glass of wine now & then; last wine was ~ 2011"   Drug use: No     Review of Systems was negative for a full 10 system review except as noted in the History of Present Illness.   Current Outpatient Medications:    ALPRAZolam (XANAX) 0.5 MG tablet, Take 1/2-1 tablet twice daily as needed for severe anxiety, Disp: 60 tablet, Rfl: 2   amLODipine (NORVASC) 5 MG tablet, Take 5 mg by mouth daily., Disp: , Rfl:    Ascorbic Acid (VITAMIN C) 100 MG tablet, Take 100 mg by mouth daily., Disp: , Rfl:    aspirin EC 81 MG tablet, Take 81 mg by mouth daily., Disp: , Rfl:    atorvastatin (LIPITOR) 80 MG tablet, TAKE 1 TABLET BY MOUTH DAILY, Disp: 90 tablet, Rfl: 3   chlorpheniramine (CHLOR-TRIMETON) 4 MG tablet, Take 1 tablet (4 mg total) by mouth at bedtime as needed for allergies., Disp: , Rfl:    cholecalciferol (VITAMIN D) 25 MCG (1000 UNIT) tablet, Take 1,000 Units by mouth daily.,  Disp: , Rfl:    cilostazol (PLETAL) 100 MG tablet, TAKE 1 TABLET BY MOUTH TWICE DAILY BEFORE MEALS, Disp: 180 tablet, Rfl: 0   Coenzyme Q10 300 MG CAPS, Take 300 mg by mouth daily., Disp: , Rfl:    estradiol (ESTRACE) 0.1 MG/GM vaginal cream, Place 0.5 g vaginally 2 (two) times a week., Disp: , Rfl:    Evolocumab (REPATHA SURECLICK) 546 MG/ML SOAJ, Inject 140 mg into the skin every 14 (fourteen) days. (Patient taking differently: Inject 140 mg into the skin every 14 (fourteen) days. Every other Sunday),  Disp: 2 mL, Rfl: 11   fluticasone (FLONASE) 50 MCG/ACT nasal spray, SHAKE LIQUID AND USE 1 SPRAY IN EACH NOSTRIL DAILY (Patient taking differently: Place 1 spray into both nostrils daily as needed for allergies.), Disp: 16 g, Rfl: 3   ibuprofen (ADVIL) 200 MG tablet, Take 200 mg by mouth every 6 (six) hours as needed., Disp: , Rfl:    loratadine (CLARITIN) 10 MG tablet, TAKE 1 TABLET(10 MG) BY MOUTH DAILY (Patient taking differently: Take 10 mg by mouth daily.), Disp: 30 tablet, Rfl: 11   magnesium oxide (MAG-OX) 400 MG tablet, Take 400 mg by mouth 2 (two) times daily., Disp: , Rfl:    melatonin 5 MG TABS, Take 5 mg by mouth at bedtime as needed (sleep)., Disp: , Rfl:    metFORMIN (GLUCOPHAGE) 1000 MG tablet, Take 1,000 mg by mouth 2 (two) times daily with a meal., Disp: , Rfl: 0   metoprolol tartrate (LOPRESSOR) 25 MG tablet, TAKE 1 TABLET(25 MG) BY MOUTH TWICE DAILY (Patient taking differently: Take 25 mg by mouth 2 (two) times daily.), Disp: 180 tablet, Rfl: 3   ondansetron (ZOFRAN) 4 MG tablet, Take 1 tablet (4 mg total) by mouth every 8 (eight) hours as needed for nausea, vomiting or refractory nausea / vomiting., Disp: 30 tablet, Rfl: 1   pantoprazole (PROTONIX) 40 MG tablet, TAKE 1 TABLET(40 MG) BY MOUTH DAILY (Patient taking differently: Take 40 mg by mouth daily.), Disp: 90 tablet, Rfl: 3   QUERCETIN PO, Take 1,000 mg by mouth in the morning and at bedtime., Disp: , Rfl:    senna-docusate  (SENOKOT-S) 8.6-50 MG tablet, Take 1 tablet by mouth at bedtime as needed for mild constipation or moderate constipation., Disp: 20 tablet, Rfl: 0   sertraline (ZOLOFT) 100 MG tablet, Take 1 tablet (100 mg total) by mouth in the morning and at bedtime., Disp: 180 tablet, Rfl: 1   traZODone (DESYREL) 100 MG tablet, Take 1/2-1 tablet po QHS prn insomnia, Disp: 90 tablet, Rfl: 1   Trospium Chloride 60 MG CP24, Take 1 capsule (60 mg total) by mouth daily., Disp: 30 capsule, Rfl: 5   zinc gluconate 50 MG tablet, Take 50 mg by mouth daily., Disp: , Rfl:    Objective Vitals:   05/25/22 1311  BP: (!) 155/74  Pulse: 88    Gen: NAD CV: S1 S2 RRR Lungs: Clear to auscultation bilaterally Abd: soft, nontender  Previous Pelvic Exam showed: POP-Q (01/22/21):  Complete prolapse noted   POP-Q   3                                            Aa   7                                           Ba   8                                              C    7  Gh   4                                            Pb   8                                            tvl    3                                            Ap   7                                            Bp   -2       Assessment/ Plan  Assessment: The patient is a 66 y.o. year old scheduled to undergo Exam under anesthesia, robotic assisted total laparoscopic hysterectomy with bilateral salpingo-oophorectomy, sacrocolpopexy, possible posterior repair, midurethral sling, cystoscopy. Verbal consent was obtained for these procedures.  Plan: General Surgical Consent: The patient has previously been counseled on alternative treatments, and the decision by the patient and provider was to proceed with the procedure listed above.  For all procedures, there are risks of bleeding, infection, damage to surrounding organs including but not limited to bowel, bladder, blood vessels, ureters and nerves, and need  for further surgery if an injury were to occur. These risks are all low with minimally invasive surgery.   There are risks of numbness and weakness at any body site or buttock/rectal pain.  It is possible that baseline pain can be worsened by surgery, either with or without mesh. If surgery is vaginal, there is also a low risk of possible conversion to laparoscopy or open abdominal incision where indicated. Very rare risks include blood transfusion, blood clot, heart attack, pneumonia, or death.   There is also a risk of short-term postoperative urinary retention with need to use a catheter. About half of patients need to go home from surgery with a catheter, which is then later removed in the office. The risk of long-term need for a catheter is very low. There is also a risk of worsening of overactive bladder.   Sling: The effectiveness of a midurethral vaginal mesh sling is approximately 85%, and thus, there will be times when you may leak urine after surgery, especially if your bladder is full or if you have a strong cough. There is a balance between making the sling tight enough to treat your leakage but not too tight so that you have long-term difficulty emptying your bladder. A mesh sling will not directly treat overactive bladder/urge incontinence and may worsen it.  There is an FDA safety notification on vaginal mesh procedures for prolapse but NOT mesh slings. We have extensive experience and training with mesh placement and we have close postoperative follow up to identify any potential complications from mesh. It is important to realize that this mesh is a permanent implant that cannot be easily removed. There are rare risks of mesh exposure (2-4%), pain with intercourse (0-7%), and infection (<1%). The risk of mesh  exposure if more likely in a woman with risks for poor healing (prior radiation, poorly controlled diabetes, or immunocompromised). The risk of new or worsened chronic pain after mesh  implant is more common in women with baseline chronic pain and/or poorly controlled anxiety or depression. Approximately 2-4% of patients will experience longer-term post-operative voiding dysfunction that may require surgical revision of the sling. We also reviewed that postoperatively, her stream may not be as strong as before surgery.    Prolapse (with or without mesh): Risk factors for surgical failure  include things that put pressure on your pelvis and the surgical repair, including obesity, chronic cough, and heavy lifting or straining (including lifting children or adults, straining on the toilet, or lifting heavy objects such as furniture or anything weighing >25 lbs. Risks of recurrence is 20-30% with vaginal native tissue repair and a less than 10% with sacrocolpopexy with mesh.    Sacrocolpopexy: Mesh implants may provide more prolapse support, but do have some unique risks to consider. It is important to understand that mesh is permanent and cannot be easily removed. Risks of abdominal sacrocolpopexy mesh include mesh exposure (~3-6%), painful intercourse (recent studies show lower rates after surgery compared to before, with ~5-8% risk of new onset), and very rare risks of bowel or bladder injury or infection (<1%). The risk of mesh exposure is more likely in a woman with risks for poor healing (prior radiation, poorly controlled diabetes, or immunocompromised). The risk of new or worsened chronic pain after mesh implant is more common in women with baseline chronic pain and/or poorly controlled anxiety or depression. There is an FDA safety notification on vaginal mesh procedures for prolapse but NOT abdominal mesh procedures and therefore does not apply to your surgery. We have extensive experience and training with mesh placement and we have close postoperative follow up to identify any potential complications from mesh.    We discussed consent for blood products. Risks for blood transfusion  include allergic reactions, other reactions that can affect different body organs and managed accordingly, transmission of infectious diseases such as HIV or Hepatitis. However, the blood is screened. Patient consents for blood products.  Pre-operative instructions:  She was instructed to not take Aspirin/NSAIDs x 7days prior to surgery. Antibiotic prophylaxis was ordered as indicated.  Cathter use: Patient will go home with foley if needed after post-operative voiding trial.  Post-operative instructions:  She was provided with specific post-operative instructions, including precautions and signs/symptoms for which we would recommend contacting us, in addition to daytime and after-hours contact phone numbers. This was provided on a handout.   Post-operative medications: Prescriptions for motrin, tylenol, miralax, and oxycodone were sent to her pharmacy. Discussed using ibuprofen and tylenol on a schedule to limit use of narcotics.   Laboratory testing:  We will check labs: CBC, BMP, Type and screen.   Preoperative clearance:  Have received PCP clearance (in media)- low risk. Awaiting cardiac clearance from Dr [name].   Post-operative follow-up:  A post-operative appointment will be made for 6 weeks from the date of surgery. If she needs a post-operative nurse visit for a voiding trial, that will be set up after she leaves the hospital.    Patient will call the clinic or use MyChart should anything change or any new issues arise.   Jaquita Folds, MD  Time spent: I spent 20 minutes dedicated to the care of this patient on the date of this encounter to include pre-visit review of records, face-to-face time with  the patient  and post visit documentation and ordering medication/ testing.

## 2022-05-30 ENCOUNTER — Other Ambulatory Visit: Payer: Self-pay

## 2022-05-30 ENCOUNTER — Encounter (HOSPITAL_BASED_OUTPATIENT_CLINIC_OR_DEPARTMENT_OTHER): Payer: Self-pay | Admitting: Obstetrics and Gynecology

## 2022-05-30 NOTE — Progress Notes (Addendum)
I left a message with medical records at [facility], Dr. London Pepper, on 05/30/22. I requested to be faxed Ms. Bertone's LOV and any recent lab or tests. I also left a message requesting the same info on 05/25/22 with no response.  I also left a message at Dr. Tommas Olp office on 05/30/22 letting her know that the patient needs cardiac clearance and instructions for stopping Pletal before surgery if needed.

## 2022-05-30 NOTE — Progress Notes (Addendum)
Spoke w/ via phone for pre-op interview---[facility] Lab needs dos---- none              Lab results------06/10/22 lab appt for cbc, type & screen, bmp, 05/17/22 EKG in Epic & chart COVID test -----patient states asymptomatic no test needed Arrive at -------0530 on Tuesday, 06/14/22 NPO after MN NO Solid Food.  Clear liquids from MN until---0430 Med rec completed Medications to take morning of surgery -----Xanax prn, amlodipine, Lipitor, Metoprolol, Protonix, & Zoloft Diabetic medication -----Hold Metformin morning of surgery. Patient instructed no nail polish to be worn day of surgery Patient instructed to bring photo id and insurance card day of surgery Patient aware to have Driver (ride ) / caregiver    for 24 hours after surgery - sister, Wells Guiles Patient Special Instructions -----Bring CPAP, Extended recovery / overnight stay instructions given. Pre-Op special Istructions -----none Patient verbalized understanding of instructions that were given at this phone interview. Patient denies shortness of breath, chest pain, fever, cough at this phone interview.   Patient has a complex history of prior stroke 2011, MI w/cardiac arrest 2011, s/p CABG x 4 in 2011, HTN, HLD, DM2, sleep apnea, claudication, and hx of moderate bilateral ICA stenosis. She follows with cardiologist, Dr. Quay Burow, [place] 05/17/22 in Epic & chart. Cardiac clearance needed. Left message with Dr. Tommas Olp office on 05/30/22.  Cardiac clearance given via 06/08/22 encounter in Epic. Copy in chart.  Requested LOV from Dr. London Pepper at [facility] on 8/23 & 05/30/22, via voicemail message.  Received LOV dated 03/22/2022 from Dr. London Pepper at [facility], placed in chart. Medical clearance 03/25/22 from Dr. Darlin Coco in chart.  Patient instructed to get instructions for taking Pletal from Dr. Tommas Olp office. Per cardiology note, it is okay to hold Pletal 5 - 7 days if needed.

## 2022-05-30 NOTE — Progress Notes (Signed)
Your procedure is scheduled on Tuesday, 06/14/22.  Report to [facility]   Call this number if you have problems the morning of surgery  :[phone].   OUR ADDRESS IS [address] [place].  WE ARE LOCATED IN [facility].  PLEASE BRING YOUR INSURANCE CARD AND PHOTO ID DAY OF SURGERY.  ONLY 2 PEOPLE ARE ALLOWED IN  WAITING  ROOM.                                      REMEMBER:  DO NOT EAT FOOD, CANDY GUM OR MINTS  AFTER MIDNIGHT THE NIGHT BEFORE YOUR SURGERY . YOU MAY HAVE CLEAR LIQUIDS FROM MIDNIGHT THE NIGHT BEFORE YOUR SURGERY UNTIL  4:30 AM. NO CLEAR LIQUIDS AFTER   4:30 AM DAY OF SURGERY.  YOU MAY  BRUSH YOUR TEETH MORNING OF SURGERY AND RINSE YOUR MOUTH OUT, NO CHEWING GUM CANDY OR MINTS.     CLEAR LIQUID DIET   Foods Allowed                                                                     Foods Excluded  Coffee and tea, regular and decaf                             liquids that you cannot  Plain Jell-O                                                                   see through such as: Fruit ices (not with fruit pulp)                                     milk, soups, orange juice  Plain  Popsicles                                    All solid food Carbonated beverages, regular and diet                                    Cranberry, grape and apple juices Sports drinks like Gatorade _____________________________________________________________________     TAKE ONLY THESE MEDICATIONS MORNING OF SURGERY: Xanax if needed, amlodipine, Lipitor, Metoprolol, Protonix, & Zoloft  Do NOT take Metformin or any supplements on the morning of surgery.    UP TO 4 VISITORS  MAY VISIT IN THE EXTENDED RECOVERY ROOM UNTIL 800 PM ONLY.  ONE  VISITOR AGE 40 AND OVER MAY SPEND THE NIGHT AND MUST BE IN EXTENDED RECOVERY ROOM NO LATER THAN 800 PM . YOUR DISCHARGE TIME AFTER YOU SPEND THE NIGHT IS  900 AM THE MORNING AFTER YOUR SURGERY.  YOU  MAY PACK A SMALL OVERNIGHT BAG WITH TOILETRIES FOR YOUR OVERNIGHT STAY IF YOU WISH.  YOUR PRESCRIPTION MEDICATIONS WILL BE PROVIDED DURING [place].  PLEASE BRING YOUR CPAP WITH YOU ON THE DAY OF SURGERY. YOU MAY LEAVE IT IN THE CAR IN CASE YOU NEED IT DURING RECOVERY.                                      DO NOT WEAR JEWERLY, MAKE UP. DO NOT WEAR LOTIONS, POWDERS, PERFUMES OR NAIL POLISH ON YOUR FINGERNAILS. TOENAIL POLISH IS OK TO WEAR. DO NOT SHAVE FOR 48 HOURS PRIOR TO DAY OF SURGERY. MEN MAY SHAVE FACE AND NECK. CONTACTS, GLASSES, OR DENTURES MAY NOT BE WORN TO SURGERY.  REMEMBER: NO SMOKING, DRUGS OR ALCOHOL FOR 24 HOURS BEFORE YOUR SURGERY.                                    [facility] IS NOT RESPONSIBLE  FOR ANY BELONGINGS.                                                                    [name]           [place] - Preparing for Surgery Before surgery, you can play an important role.  Because skin is not sterile, your skin needs to be as free of germs as possible.  You can reduce the number of germs on your skin by washing with CHG (chlorahexidine gluconate) soap before surgery.  CHG is an antiseptic cleaner which kills germs and bonds with the skin to continue killing germs even after washing. Please DO NOT use if you have an allergy to CHG or antibacterial soaps.  If your skin becomes reddened/irritated stop using the CHG and inform your nurse when you arrive at Short Stay. Do not shave (including legs and underarms) for at least 48 hours prior to the first CHG shower.  You may shave your face/neck. Please follow these instructions carefully:  1.  Shower with CHG Soap the night before surgery and the  morning of Surgery.  2.  If you choose to wash your hair, wash your hair first as usual with your  normal  shampoo.  3.  After you shampoo, rinse your hair and body thoroughly to remove the  shampoo.                            4.  Use CHG as you would any other liquid soap.   You can apply chg directly  to the skin and wash , please wash your belly button thoroughly with chg soap provided night before and morning of your surgery.                     Gently with a scrungie or clean washcloth.  5.  Apply the CHG Soap to your body ONLY FROM THE NECK DOWN.   Do not use on face/ open  Wound or open sores. Avoid contact with eyes, ears mouth and genitals (private parts).                       Wash face,  Genitals (private parts) with your normal soap.             6.  Wash thoroughly, paying special attention to the area where your surgery  will be performed.  7.  Thoroughly rinse your body with warm water from the neck down.  8.  DO NOT shower/wash with your normal soap after using and rinsing off  the CHG Soap.                9.  Pat yourself dry with a clean towel.            10.  Wear clean pajamas.            11.  Place clean sheets on your bed the night of your first shower and do not  sleep with pets. Day of Surgery : Do not apply any lotions/deodorants the morning of surgery.  Please wear clean clothes to the hospital/surgery center.  IF YOU HAVE ANY SKIN IRRITATION OR PROBLEMS WITH THE SURGICAL SOAP, PLEASE GET A BAR OF GOLD DIAL SOAP AND SHOWER THE NIGHT BEFORE YOUR SURGERY AND THE MORNING OF YOUR SURGERY. PLEASE LET THE NURSE KNOW MORNING OF YOUR SURGERY IF YOU HAD ANY PROBLEMS WITH THE SURGICAL SOAP.   ________________________________________________________________________                                                        QUESTIONS [facility] PRE OP NURSE PHONE [phone].

## 2022-06-02 ENCOUNTER — Ambulatory Visit (HOSPITAL_COMMUNITY)
Admission: RE | Admit: 2022-06-02 | Discharge: 2022-06-02 | Disposition: A | Discharge status: Home / Self Care | Payer: PPO | Source: Ambulatory Visit | Attending: Cardiology | Admitting: Cardiology

## 2022-06-02 DIAGNOSIS — I6523 Occlusion and stenosis of bilateral carotid arteries: Secondary | ICD-10-CM | POA: Diagnosis not present

## 2022-06-02 DIAGNOSIS — E782 Mixed hyperlipidemia: Secondary | ICD-10-CM | POA: Insufficient documentation

## 2022-06-02 DIAGNOSIS — I739 Peripheral vascular disease, unspecified: Secondary | ICD-10-CM | POA: Diagnosis not present

## 2022-06-02 DIAGNOSIS — I2510 Atherosclerotic heart disease of native coronary artery without angina pectoris: Secondary | ICD-10-CM | POA: Diagnosis not present

## 2022-06-07 ENCOUNTER — Telehealth: Payer: Self-pay | Admitting: Cardiovascular Disease

## 2022-06-07 NOTE — Telephone Encounter (Signed)
   Pre-operative Risk Assessment    Patient Name: Alicia Crawford  DOB: 1956-06-22 MRN: [id number]      Request for Surgical Clearance    Procedure:   Robotic Gynecological Procedure  Date of Surgery:  Clearance 06/14/22                                 Surgeon:  Dr. Christin Fudge Surgeon's Group or Practice Name:  [facility] Phone number:  [phone] Fax number:  [phone]   Type of Clearance Requested:   - Medical    Type of Anesthesia:  General    Additional requests/questions:  Please advise surgeon/provider what medications should be held.  Signed, Belisicia T Harris   06/07/2022, 1:33 PM

## 2022-06-07 NOTE — Progress Notes (Addendum)
I left a message at Dr. Efrain Sella office at # [phone]  reminding them that patient needs cardiac clearance. After reviewing LOV from PCP, patient needs medical clearance from Dr. London Pepper at [facility] also. Also sent info via Epic IB on 06/07/2022 to Dr. Wannetta Sender to make sure that she is aware.  Medical clearance by Dr. London Pepper given on 03/25/22. Clearance in chart and under Media in Epic dated 05/09/22.

## 2022-06-08 DIAGNOSIS — E785 Hyperlipidemia, unspecified: Secondary | ICD-10-CM | POA: Diagnosis not present

## 2022-06-08 DIAGNOSIS — I10 Essential (primary) hypertension: Secondary | ICD-10-CM | POA: Diagnosis not present

## 2022-06-08 DIAGNOSIS — I2510 Atherosclerotic heart disease of native coronary artery without angina pectoris: Secondary | ICD-10-CM | POA: Diagnosis not present

## 2022-06-08 DIAGNOSIS — F329 Major depressive disorder, single episode, unspecified: Secondary | ICD-10-CM | POA: Diagnosis not present

## 2022-06-08 NOTE — Progress Notes (Signed)
I called and let patient know that we have received medical and cardiac clearance for her hysterectomy on 06/14/22. I also told her to ask Dr. Wannetta Sender for instructions on holding Pletal before surgery. I sent an Epic IB to Dr. Wannetta Sender on 06/08/22 letting her know the above information.

## 2022-06-08 NOTE — Telephone Encounter (Signed)
   Primary Cardiologist: Quay Burow, MD  Chart reviewed as part of pre-operative protocol coverage. Given past medical history and time since last visit, based on ACC/AHA guidelines, Alicia Crawford would be at acceptable risk for the planned procedure without further cardiovascular testing.   She may hold Pletal in the perioperative period if deemed necessary and should resume as soon as hemodynamically stable following the procedure.   I will route this recommendation to the requesting party via Epic fax function and remove from pre-op pool.  Please call with questions.  Emmaline Life, NP-C     06/08/2022, 11:55 AM [address] Office [phone] Fax [phone]

## 2022-06-08 NOTE — Telephone Encounter (Signed)
Dr. [name], you recently saw this patient on 05/17/22 at which time it appears she was doing well. We now have request for surgical clearance for robotic gynecological procedure with question of medications that should be held.  Do you agree that she may proceed with upcoming procedure and may hold her Pletal for 5 to 7 days prior?  Please direct your response to p cv div preop  Thank you, Emmaline Life, NP-C     06/08/2022, 8:38 AM [address] Office [phone] Fax [phone]

## 2022-06-10 ENCOUNTER — Other Ambulatory Visit: Payer: Self-pay | Admitting: Cardiovascular Disease

## 2022-06-10 ENCOUNTER — Encounter (HOSPITAL_COMMUNITY)
Admission: RE | Admit: 2022-06-10 | Discharge: 2022-06-10 | Disposition: A | Discharge status: Home / Self Care | Payer: PPO | Source: Ambulatory Visit | Attending: Obstetrics and Gynecology | Admitting: Obstetrics and Gynecology

## 2022-06-10 DIAGNOSIS — Z01812 Encounter for preprocedural laboratory examination: Secondary | ICD-10-CM | POA: Diagnosis present

## 2022-06-10 DIAGNOSIS — Z01818 Encounter for other preprocedural examination: Secondary | ICD-10-CM

## 2022-06-10 DIAGNOSIS — R7989 Other specified abnormal findings of blood chemistry: Secondary | ICD-10-CM | POA: Diagnosis not present

## 2022-06-10 LAB — BASIC METABOLIC PANEL
Anion gap: 7 (ref 5–15)
BUN: 31 mg/dL — ABNORMAL HIGH (ref 8–23)
CO2: 20 mmol/L — ABNORMAL LOW (ref 22–32)
Calcium: 9.5 mg/dL (ref 8.9–10.3)
Chloride: 108 mmol/L (ref 98–111)
Creatinine, Ser: 0.75 mg/dL (ref 0.44–1.00)
GFR, Estimated: >60 mL/min (ref >60)
Glucose, Bld: 110 mg/dL — ABNORMAL HIGH (ref 70–99)
Potassium: 4.1 mmol/L (ref 3.5–5.1)
Sodium: 135 mmol/L (ref 135–145)

## 2022-06-10 LAB — CBC
HCT: 37.9 % (ref 36.0–46.0)
Hemoglobin: 12.4 g/dL (ref 12.0–15.0)
MCH: 30.4 pg (ref 26.0–34.0)
MCHC: 32.7 g/dL (ref 30.0–36.0)
MCV: 92.9 fL (ref 80.0–100.0)
Platelets: 397 10*3/uL (ref 150–400)
RBC: 4.08 MIL/uL (ref 3.87–5.11)
RDW: 13.9 % (ref 11.5–15.5)
WBC: 7.5 10*3/uL (ref 4.0–10.5)
nRBC: 0 % (ref 0.0–0.2)

## 2022-06-10 NOTE — Progress Notes (Signed)
Cbc results routed to dr schroeder epic ib (hemaglobin 9.5)

## 2022-06-13 ENCOUNTER — Telehealth: Payer: Self-pay | Admitting: Psychiatry

## 2022-06-13 ENCOUNTER — Other Ambulatory Visit: Payer: Self-pay

## 2022-06-13 DIAGNOSIS — F410 Panic disorder [episodic paroxysmal anxiety] without agoraphobia: Secondary | ICD-10-CM

## 2022-06-13 MED ORDER — ALPRAZOLAM 0.5 MG PO TABS: ORAL_TABLET | ORAL | 2 refills | Status: DC

## 2022-06-13 NOTE — Telephone Encounter (Signed)
Pended.

## 2022-06-13 NOTE — Telephone Encounter (Signed)
Pt LVM @ 10:21a.  She is having surgery tomorrow.  She is asking if [facility] will refill Alprazolam  .'5mg'$  for anxiety.  She said she takes 1/2 pill every other day.  I had to call back to find out where she wanted the med sent and spoke to her husband who is on the [facility].  He said she uses [facility] on [address].    Next appt 12/6

## 2022-06-14 ENCOUNTER — Encounter (HOSPITAL_BASED_OUTPATIENT_CLINIC_OR_DEPARTMENT_OTHER)
Admission: RE | Disposition: A | Discharge status: Home / Self Care | Payer: Self-pay | Source: Home / Self Care | Attending: Obstetrics and Gynecology

## 2022-06-14 ENCOUNTER — Other Ambulatory Visit: Payer: Self-pay

## 2022-06-14 ENCOUNTER — Ambulatory Visit (HOSPITAL_BASED_OUTPATIENT_CLINIC_OR_DEPARTMENT_OTHER)
Admission: RE | Admit: 2022-06-14 | Discharge: 2022-06-14 | Disposition: A | Discharge status: Home / Self Care | Payer: PPO | Attending: Obstetrics and Gynecology | Admitting: Obstetrics and Gynecology

## 2022-06-14 ENCOUNTER — Ambulatory Visit (HOSPITAL_BASED_OUTPATIENT_CLINIC_OR_DEPARTMENT_OTHER): Payer: PPO | Admitting: Anesthesiology

## 2022-06-14 ENCOUNTER — Encounter (HOSPITAL_BASED_OUTPATIENT_CLINIC_OR_DEPARTMENT_OTHER): Payer: Self-pay | Admitting: Obstetrics and Gynecology

## 2022-06-14 DIAGNOSIS — I10 Essential (primary) hypertension: Secondary | ICD-10-CM

## 2022-06-14 DIAGNOSIS — I252 Old myocardial infarction: Secondary | ICD-10-CM | POA: Diagnosis not present

## 2022-06-14 DIAGNOSIS — N812 Incomplete uterovaginal prolapse: Secondary | ICD-10-CM | POA: Diagnosis not present

## 2022-06-14 DIAGNOSIS — N813 Complete uterovaginal prolapse: Secondary | ICD-10-CM | POA: Diagnosis present

## 2022-06-14 DIAGNOSIS — N8110 Cystocele, unspecified: Secondary | ICD-10-CM

## 2022-06-14 DIAGNOSIS — D270 Benign neoplasm of right ovary: Secondary | ICD-10-CM | POA: Diagnosis not present

## 2022-06-14 DIAGNOSIS — E1151 Type 2 diabetes mellitus with diabetic peripheral angiopathy without gangrene: Secondary | ICD-10-CM | POA: Insufficient documentation

## 2022-06-14 DIAGNOSIS — D259 Leiomyoma of uterus, unspecified: Secondary | ICD-10-CM | POA: Insufficient documentation

## 2022-06-14 DIAGNOSIS — Z87891 Personal history of nicotine dependence: Secondary | ICD-10-CM | POA: Insufficient documentation

## 2022-06-14 DIAGNOSIS — N393 Stress incontinence (female) (male): Secondary | ICD-10-CM

## 2022-06-14 DIAGNOSIS — I2510 Atherosclerotic heart disease of native coronary artery without angina pectoris: Secondary | ICD-10-CM

## 2022-06-14 DIAGNOSIS — Z01818 Encounter for other preprocedural examination: Secondary | ICD-10-CM

## 2022-06-14 DIAGNOSIS — K219 Gastro-esophageal reflux disease without esophagitis: Secondary | ICD-10-CM | POA: Diagnosis not present

## 2022-06-14 DIAGNOSIS — G4730 Sleep apnea, unspecified: Secondary | ICD-10-CM | POA: Diagnosis not present

## 2022-06-14 DIAGNOSIS — N8111 Cystocele, midline: Secondary | ICD-10-CM | POA: Diagnosis not present

## 2022-06-14 DIAGNOSIS — Z7984 Long term (current) use of oral hypoglycemic drugs: Secondary | ICD-10-CM | POA: Insufficient documentation

## 2022-06-14 DIAGNOSIS — N816 Rectocele: Secondary | ICD-10-CM | POA: Diagnosis not present

## 2022-06-14 DIAGNOSIS — D251 Intramural leiomyoma of uterus: Secondary | ICD-10-CM | POA: Diagnosis not present

## 2022-06-14 HISTORY — DX: Inflammatory liver disease, unspecified: K75.9

## 2022-06-14 HISTORY — PX: CYSTOSCOPY: SHX5120

## 2022-06-14 HISTORY — PX: BLADDER SUSPENSION: SHX72

## 2022-06-14 HISTORY — DX: Presence of spectacles and contact lenses: Z97.3

## 2022-06-14 HISTORY — PX: XI ROBOTIC ASSISTED TOTAL HYSTERECTOMY WITH SACROCOLPOPEXY: SHX6825

## 2022-06-14 HISTORY — DX: Chronic kidney disease, unspecified: N18.9

## 2022-06-14 LAB — TYPE AND SCREEN
ABO/RH(D): O POS
Antibody Screen: NEGATIVE

## 2022-06-14 LAB — GLUCOSE, CAPILLARY
Glucose-Capillary: 83 mg/dL (ref 70–99)
Glucose-Capillary: 197 mg/dL — ABNORMAL HIGH (ref 70–99)

## 2022-06-14 SURGERY — HYSTERECTOMY, TOTAL, ROBOT-ASSISTED, WITH SACROCOLPOPEXY
Anesthesia: General | Site: Vagina

## 2022-06-14 MED ORDER — ALBUMIN HUMAN 5 % IV SOLN
INTRAVENOUS | Status: AC
  Filled 2022-06-14: qty 250

## 2022-06-14 MED ORDER — ROCURONIUM BROMIDE 100 MG/10ML IV SOLN
INTRAVENOUS | Status: DC | PRN
  Administered 2022-06-14: 30 mg via INTRAVENOUS
  Administered 2022-06-14: 10 mg via INTRAVENOUS
  Administered 2022-06-14: 70 mg via INTRAVENOUS
  Administered 2022-06-14: 10 mg via INTRAVENOUS
  Administered 2022-06-14: 20 mg via INTRAVENOUS

## 2022-06-14 MED ORDER — FENTANYL CITRATE (PF) 100 MCG/2ML IJ SOLN
INTRAMUSCULAR | Status: DC | PRN
  Administered 2022-06-14 (×2): 50 ug via INTRAVENOUS
  Administered 2022-06-14: 25 ug via INTRAVENOUS

## 2022-06-14 MED ORDER — ACETAMINOPHEN 325 MG PO TABS: 650.0000 mg | ORAL_TABLET | ORAL | Status: DC | PRN

## 2022-06-14 MED ORDER — LIDOCAINE-EPINEPHRINE 1 %-1:100000 IJ SOLN
INTRAMUSCULAR | Status: DC | PRN
  Administered 2022-06-14: 8 mL

## 2022-06-14 MED ORDER — PHENAZOPYRIDINE HCL 100 MG PO TABS
ORAL_TABLET | ORAL | Status: AC
  Filled 2022-06-14: qty 2

## 2022-06-14 MED ORDER — KETOROLAC TROMETHAMINE 30 MG/ML IJ SOLN
INTRAMUSCULAR | Status: DC | PRN
  Administered 2022-06-14: 30 mg via INTRAVENOUS

## 2022-06-14 MED ORDER — GABAPENTIN 300 MG PO CAPS
300.0000 mg | ORAL_CAPSULE | ORAL | Status: AC
  Administered 2022-06-14: 300 mg via ORAL

## 2022-06-14 MED ORDER — FENTANYL CITRATE (PF) 100 MCG/2ML IJ SOLN: 25.0000 ug | INTRAMUSCULAR | Status: DC | PRN

## 2022-06-14 MED ORDER — PHENAZOPYRIDINE HCL 100 MG PO TABS
200.0000 mg | ORAL_TABLET | ORAL | Status: AC
  Administered 2022-06-14: 200 mg via ORAL

## 2022-06-14 MED ORDER — DOCUSATE SODIUM 100 MG PO CAPS: 100.0000 mg | ORAL_CAPSULE | Freq: Two times a day (BID) | ORAL | Status: DC

## 2022-06-14 MED ORDER — SCOPOLAMINE 1 MG/3DAYS TD PT72
MEDICATED_PATCH | TRANSDERMAL | Status: AC
  Filled 2022-06-14: qty 1

## 2022-06-14 MED ORDER — SODIUM CHLORIDE 0.9 % IR SOLN
Status: DC | PRN
  Administered 2022-06-14: 1000 mL

## 2022-06-14 MED ORDER — SCOPOLAMINE 1 MG/3DAYS TD PT72
1.0000 | MEDICATED_PATCH | TRANSDERMAL | Status: DC
  Administered 2022-06-14: 1.5 mg via TRANSDERMAL

## 2022-06-14 MED ORDER — GABAPENTIN 300 MG PO CAPS
ORAL_CAPSULE | ORAL | Status: AC
  Filled 2022-06-14: qty 1

## 2022-06-14 MED ORDER — PHENYLEPHRINE HCL (PRESSORS) 10 MG/ML IV SOLN
INTRAVENOUS | Status: AC
  Filled 2022-06-14: qty 1

## 2022-06-14 MED ORDER — PHENYLEPHRINE HCL-NACL 20-0.9 MG/250ML-% IV SOLN
INTRAVENOUS | Status: DC | PRN
  Administered 2022-06-14: 40 ug/min via INTRAVENOUS

## 2022-06-14 MED ORDER — METOPROLOL TARTRATE 5 MG/5ML IV SOLN
INTRAVENOUS | Status: DC | PRN
  Administered 2022-06-14: 1 mg via INTRAVENOUS
  Administered 2022-06-14: 2 mg via INTRAVENOUS
  Administered 2022-06-14 (×2): 1 mg via INTRAVENOUS

## 2022-06-14 MED ORDER — ACETAMINOPHEN 500 MG PO TABS: 1000.0000 mg | ORAL_TABLET | ORAL | Status: DC

## 2022-06-14 MED ORDER — AMISULPRIDE (ANTIEMETIC) 5 MG/2ML IV SOLN: 10.0000 mg | Freq: Once | INTRAVENOUS | Status: DC | PRN

## 2022-06-14 MED ORDER — SODIUM CHLORIDE 0.9 % IV SOLN: INTRAVENOUS | Status: DC

## 2022-06-14 MED ORDER — SUGAMMADEX SODIUM 200 MG/2ML IV SOLN
INTRAVENOUS | Status: DC | PRN
  Administered 2022-06-14: 130 mg via INTRAVENOUS

## 2022-06-14 MED ORDER — ACETAMINOPHEN 500 MG PO TABS
ORAL_TABLET | ORAL | Status: AC
  Filled 2022-06-14: qty 2

## 2022-06-14 MED ORDER — PHENYLEPHRINE HCL (PRESSORS) 10 MG/ML IV SOLN
INTRAVENOUS | Status: DC | PRN
  Administered 2022-06-14: 160 ug via INTRAVENOUS
  Administered 2022-06-14 (×5): 80 ug via INTRAVENOUS

## 2022-06-14 MED ORDER — EPHEDRINE 5 MG/ML INJ
INTRAVENOUS | Status: AC
  Filled 2022-06-14: qty 5

## 2022-06-14 MED ORDER — ACETAMINOPHEN 500 MG PO TABS
1000.0000 mg | ORAL_TABLET | Freq: Once | ORAL | Status: AC
  Administered 2022-06-14: 1000 mg via ORAL

## 2022-06-14 MED ORDER — SUGAMMADEX SODIUM 200 MG/2ML IV SOLN: INTRAVENOUS | Status: DC | PRN

## 2022-06-14 MED ORDER — KETAMINE HCL 10 MG/ML IJ SOLN
INTRAMUSCULAR | Status: DC | PRN
  Administered 2022-06-14: 25 mg via INTRAVENOUS

## 2022-06-14 MED ORDER — DEXAMETHASONE SODIUM PHOSPHATE 4 MG/ML IJ SOLN
INTRAMUSCULAR | Status: DC | PRN
  Administered 2022-06-14: 4 mg via INTRAVENOUS

## 2022-06-14 MED ORDER — SURGIFLO WITH THROMBIN (HEMOSTATIC MATRIX KIT) OPTIME
TOPICAL | Status: DC | PRN
  Administered 2022-06-14: 1 via TOPICAL

## 2022-06-14 MED ORDER — WATER FOR IRRIGATION, STERILE IR SOLN
Status: DC | PRN
  Administered 2022-06-14: 500 mL

## 2022-06-14 MED ORDER — CEFAZOLIN SODIUM-DEXTROSE 2-4 GM/100ML-% IV SOLN
2.0000 g | INTRAVENOUS | Status: AC
  Administered 2022-06-14: 2 g via INTRAVENOUS

## 2022-06-14 MED ORDER — LIDOCAINE HCL (PF) 2 % IJ SOLN
INTRAMUSCULAR | Status: AC
  Filled 2022-06-14: qty 10

## 2022-06-14 MED ORDER — FENTANYL CITRATE (PF) 250 MCG/5ML IJ SOLN
INTRAMUSCULAR | Status: AC
  Filled 2022-06-14: qty 5

## 2022-06-14 MED ORDER — ROCURONIUM BROMIDE 10 MG/ML (PF) SYRINGE
PREFILLED_SYRINGE | INTRAVENOUS | Status: AC
  Filled 2022-06-14: qty 30

## 2022-06-14 MED ORDER — INSULIN ASPART 100 UNIT/ML IJ SOLN: 0.0000 [IU] | Freq: Three times a day (TID) | INTRAMUSCULAR | Status: DC

## 2022-06-14 MED ORDER — CEFAZOLIN SODIUM-DEXTROSE 2-4 GM/100ML-% IV SOLN
INTRAVENOUS | Status: AC
  Filled 2022-06-14: qty 100

## 2022-06-14 MED ORDER — SIMETHICONE 80 MG PO CHEW: 80.0000 mg | CHEWABLE_TABLET | Freq: Four times a day (QID) | ORAL | Status: DC | PRN

## 2022-06-14 MED ORDER — ENOXAPARIN SODIUM 40 MG/0.4ML IJ SOSY
40.0000 mg | PREFILLED_SYRINGE | INTRAMUSCULAR | Status: AC
  Administered 2022-06-14: 40 mg via SUBCUTANEOUS

## 2022-06-14 MED ORDER — PROPOFOL 1000 MG/100ML IV EMUL
INTRAVENOUS | Status: AC
  Filled 2022-06-14: qty 100

## 2022-06-14 MED ORDER — ONDANSETRON HCL 4 MG/2ML IJ SOLN: 4.0000 mg | Freq: Four times a day (QID) | INTRAMUSCULAR | Status: DC | PRN

## 2022-06-14 MED ORDER — OXYCODONE HCL 5 MG PO TABS: 5.0000 mg | ORAL_TABLET | ORAL | Status: DC | PRN

## 2022-06-14 MED ORDER — LIDOCAINE HCL 1 % IJ SOLN: INTRAMUSCULAR | Status: DC | PRN

## 2022-06-14 MED ORDER — EPHEDRINE SULFATE (PRESSORS) 50 MG/ML IJ SOLN
INTRAMUSCULAR | Status: DC | PRN
  Administered 2022-06-14: 15 mg via INTRAVENOUS
  Administered 2022-06-14: 10 mg via INTRAVENOUS
  Administered 2022-06-14: 15 mg via INTRAVENOUS

## 2022-06-14 MED ORDER — PROMETHAZINE HCL 25 MG/ML IJ SOLN: 6.2500 mg | INTRAMUSCULAR | Status: DC | PRN

## 2022-06-14 MED ORDER — BUPIVACAINE HCL (PF) 0.25 % IJ SOLN
INTRAMUSCULAR | Status: DC | PRN
  Administered 2022-06-14: 15 mL

## 2022-06-14 MED ORDER — LIDOCAINE HCL (PF) 2 % IJ SOLN
INTRAMUSCULAR | Status: DC | PRN
  Administered 2022-06-14: 1.5 mg/kg/h via INTRADERMAL

## 2022-06-14 MED ORDER — PROPOFOL 10 MG/ML IV BOLUS
INTRAVENOUS | Status: DC | PRN
  Administered 2022-06-14: 150 mg via INTRAVENOUS

## 2022-06-14 MED ORDER — PHENYLEPHRINE 80 MCG/ML (10ML) SYRINGE FOR IV PUSH (FOR BLOOD PRESSURE SUPPORT)
PREFILLED_SYRINGE | INTRAVENOUS | Status: AC
  Filled 2022-06-14: qty 30

## 2022-06-14 MED ORDER — ENOXAPARIN SODIUM 40 MG/0.4ML IJ SOSY
PREFILLED_SYRINGE | INTRAMUSCULAR | Status: AC
  Filled 2022-06-14: qty 0.4

## 2022-06-14 MED ORDER — ALBUMIN HUMAN 5 % IV SOLN: INTRAVENOUS | Status: DC | PRN

## 2022-06-14 MED ORDER — KETAMINE HCL 50 MG/5ML IJ SOSY
PREFILLED_SYRINGE | INTRAMUSCULAR | Status: AC
  Filled 2022-06-14: qty 5

## 2022-06-14 MED ORDER — METOPROLOL TARTRATE 5 MG/5ML IV SOLN
INTRAVENOUS | Status: AC
  Filled 2022-06-14: qty 5

## 2022-06-14 MED ORDER — ONDANSETRON HCL 4 MG PO TABS: 4.0000 mg | ORAL_TABLET | Freq: Four times a day (QID) | ORAL | Status: DC | PRN

## 2022-06-14 MED ORDER — DEXMEDETOMIDINE (PRECEDEX) IN NS 20 MCG/5ML (4 MCG/ML) IV SYRINGE
PREFILLED_SYRINGE | INTRAVENOUS | Status: DC | PRN
  Administered 2022-06-14: 4 ug via INTRAVENOUS
  Administered 2022-06-14: 8 ug via INTRAVENOUS

## 2022-06-14 MED ORDER — POVIDONE-IODINE 10 % EX SWAB: 2.0000 | Freq: Once | CUTANEOUS | Status: DC

## 2022-06-14 SURGICAL SUPPLY — 94 items
ADH SKN CLS APL DERMABOND .7 (GAUZE/BANDAGES/DRESSINGS) ×4
AGENT HMST KT MTR STRL THRMB (HEMOSTASIS) ×4
APL ESCP 34 STRL LF DISP (HEMOSTASIS)
APL PRP STRL LF DISP 70% ISPRP (MISCELLANEOUS) ×4
APPLICATOR SURGIFLO ENDO (HEMOSTASIS) IMPLANT
BLADE CLIPPER SENSICLIP SURGIC (BLADE) ×4 IMPLANT
BLADE SURG 15 STRL LF DISP TIS (BLADE) ×4 IMPLANT
BLADE SURG 15 STRL SS (BLADE) ×4
CATH FOLEY 3WAY  5CC 16FR (CATHETERS) ×4
CATH FOLEY 3WAY 5CC 16FR (CATHETERS) ×4 IMPLANT
CHLORAPREP W/TINT 26 (MISCELLANEOUS) ×4 IMPLANT
COVER BACK TABLE 60X90IN (DRAPES) ×4 IMPLANT
COVER TIP SHEARS 8 DVNC (MISCELLANEOUS) ×4 IMPLANT
COVER TIP SHEARS 8MM DA VINCI (MISCELLANEOUS) ×4
DECANTER SPIKE VIAL GLASS SM (MISCELLANEOUS) ×8 IMPLANT
DEFOGGER SCOPE WARMER CLEARIFY (MISCELLANEOUS) ×4 IMPLANT
DERMABOND ADVANCED .7 DNX12 (GAUZE/BANDAGES/DRESSINGS) ×4 IMPLANT
DEVICE CAPIO SLIM SINGLE (INSTRUMENTS) IMPLANT
DRAPE ARM DVNC X/XI (DISPOSABLE) ×16 IMPLANT
DRAPE COLUMN DVNC XI (DISPOSABLE) ×4 IMPLANT
DRAPE DA VINCI XI ARM (DISPOSABLE) ×16
DRAPE DA VINCI XI COLUMN (DISPOSABLE) ×4
DRAPE SHEET LG 3/4 BI-LAMINATE (DRAPES) ×4 IMPLANT
DRAPE UTILITY XL STRL (DRAPES) ×4 IMPLANT
ELECT REM PT RETURN 9FT ADLT (ELECTROSURGICAL) ×4
ELECTRODE REM PT RTRN 9FT ADLT (ELECTROSURGICAL) ×4 IMPLANT
GAUZE 4X4 16PLY ~~LOC~~+RFID DBL (SPONGE) ×8 IMPLANT
GLOVE BIO SURGEON STRL SZ 6 (GLOVE) ×12 IMPLANT
GLOVE BIOGEL PI IND STRL 6.5 (GLOVE) ×16 IMPLANT
GLOVE BIOGEL PI IND STRL 7.0 (GLOVE) ×8 IMPLANT
GLOVE ECLIPSE 6.0 STRL STRAW (GLOVE) ×4 IMPLANT
GOWN STRL REUS W/TWL LRG LVL3 (GOWN DISPOSABLE) ×4 IMPLANT
HIBICLENS CHG 4% 4OZ BTL (MISCELLANEOUS) ×4 IMPLANT
HOLDER FOLEY CATH W/STRAP (MISCELLANEOUS) ×4 IMPLANT
IRRIG SUCT STRYKERFLOW 2 WTIP (MISCELLANEOUS) ×4
IRRIGATION SUCT STRKRFLW 2 WTP (MISCELLANEOUS) ×4 IMPLANT
KIT TURNOVER CYSTO (KITS) ×4 IMPLANT
LEGGING LITHOTOMY PAIR STRL (DRAPES) ×4 IMPLANT
MANIFOLD NEPTUNE II (INSTRUMENTS) ×4 IMPLANT
MANIPULATOR ADVINCU DEL 2.5 PL (MISCELLANEOUS) IMPLANT
MANIPULATOR ADVINCU DEL 3.0 PL (MISCELLANEOUS) IMPLANT
MANIPULATOR ADVINCU DEL 3.5 PL (MISCELLANEOUS) ×1 IMPLANT
MANIPULATOR ADVINCU DEL 4.0 PL (MISCELLANEOUS) IMPLANT
MESH VERTESSA LITE -Y 2X4X3 (Mesh General) ×4 IMPLANT
NDL INSUFFLATION 14GA 120MM (NEEDLE) ×3 IMPLANT
NDL MAYO 6 CRC TAPER PT (NEEDLE) IMPLANT
NEEDLE HYPO 22GX1.5 SAFETY (NEEDLE) ×4 IMPLANT
NEEDLE INSUFFLATION 14GA 120MM (NEEDLE) ×4 IMPLANT
NEEDLE MAYO 6 CRC TAPER PT (NEEDLE) IMPLANT
NS IRRIG 1000ML POUR BTL (IV SOLUTION) ×4 IMPLANT
OBTURATOR OPTICAL STANDARD 8MM (TROCAR) ×4
OBTURATOR OPTICAL STND 8 DVNC (TROCAR) ×4
OBTURATOR OPTICALSTD 8 DVNC (TROCAR) ×4 IMPLANT
PACK CYSTO (CUSTOM PROCEDURE TRAY) ×4 IMPLANT
PACK ROBOT WH (CUSTOM PROCEDURE TRAY) ×4 IMPLANT
PACK ROBOTIC GOWN (GOWN DISPOSABLE) ×4 IMPLANT
PACK VAGINAL WOMENS (CUSTOM PROCEDURE TRAY) ×4 IMPLANT
PAD OB MATERNITY 4.3X12.25 (PERSONAL CARE ITEMS) ×4 IMPLANT
PAD POSITIONING PINK XL (MISCELLANEOUS) ×4 IMPLANT
PAD PREP 24X48 CUFFED NSTRL (MISCELLANEOUS) ×4 IMPLANT
POUCH LAPAROSCOPIC INSTRUMENT (MISCELLANEOUS) IMPLANT
PROTECTOR NERVE ULNAR (MISCELLANEOUS) ×4 IMPLANT
RETRACTOR LONE STAR DISPOSABLE (INSTRUMENTS) ×4 IMPLANT
RETRACTOR STAY HOOK 5MM (MISCELLANEOUS) ×4 IMPLANT
SEAL CANN UNIV 5-8 DVNC XI (MISCELLANEOUS) ×21 IMPLANT
SEAL XI 5MM-8MM UNIVERSAL (MISCELLANEOUS) ×24
SEALER VESSEL DA VINCI XI (MISCELLANEOUS) ×4
SEALER VESSEL EXT DVNC XI (MISCELLANEOUS) ×1 IMPLANT
SET IRRIG Y TYPE TUR BLADDER L (SET/KITS/TRAYS/PACK) ×4 IMPLANT
SET TUBE SMOKE EVAC HIGH FLOW (TUBING) ×4 IMPLANT
SPONGE T-LAP 4X18 ~~LOC~~+RFID (SPONGE) IMPLANT
SUCTION FRAZIER HANDLE 10FR (MISCELLANEOUS) ×4
SUCTION TUBE FRAZIER 10FR DISP (MISCELLANEOUS) ×4 IMPLANT
SURGIFLO W/THROMBIN 8M KIT (HEMOSTASIS) ×1 IMPLANT
SUT ABS MONO DBL WITH NDL 48IN (SUTURE) IMPLANT
SUT GORETEX NAB #0 THX26 36IN (SUTURE) ×4 IMPLANT
SUT MNCRL AB 4-0 PS2 18 (SUTURE) ×4 IMPLANT
SUT MON AB 2-0 SH 27 (SUTURE) ×5 IMPLANT
SUT V-LOC BARB 180 2/0GR6 GS22 (SUTURE) ×16
SUT VIC AB 0 CT1 27 (SUTURE) ×8
SUT VIC AB 0 CT1 27XBRD ANTBC (SUTURE) ×8 IMPLANT
SUT VIC AB 2-0 SH 27 (SUTURE) ×8
SUT VIC AB 2-0 SH 27XBRD (SUTURE) ×5 IMPLANT
SUT VICRYL 2-0 SH 8X27 (SUTURE) ×4 IMPLANT
SUT VLOC 180 0 6IN GS21 (SUTURE) ×1 IMPLANT
SUT VLOC 180 0 9IN  GS21 (SUTURE)
SUT VLOC 180 0 9IN GS21 (SUTURE) IMPLANT
SUTURE V-LC BRB 180 2/0GR6GS22 (SUTURE) ×10 IMPLANT
SYR BULB EAR ULCER 3OZ GRN STR (SYRINGE) ×4 IMPLANT
SYS SLING ADV FIT BLUE TRNSVAG (Sling) ×1 IMPLANT
TOWEL OR 17X26 10 PK STRL BLUE (TOWEL DISPOSABLE) ×4 IMPLANT
TRAY FOLEY W/BAG SLVR 14FR (SET/KITS/TRAYS/PACK) ×4 IMPLANT
TRAY FOLEY W/BAG SLVR 14FR LF (SET/KITS/TRAYS/PACK) ×4 IMPLANT
WATER STERILE IRR 500ML POUR (IV SOLUTION) ×1 IMPLANT

## 2022-06-14 NOTE — Transfer of Care (Signed)
Immediate Anesthesia Transfer of Care Note  Patient: Alicia Crawford  Procedure(s) Performed: XI ROBOTIC ASSISTED TOTAL HYSTERECTOMY WITH BILATERAL SALPINGECTOMY AND SACROCOLPOPEXY (Pelvis) TRANSVAGINAL TAPE (TVT) PROCEDURE (Vagina ) CYSTOSCOPY (Bladder)  Patient Location: PACU  Anesthesia Type:General  Level of Consciousness: drowsy  Airway & Oxygen Therapy: Patient Spontanous Breathing and Patient connected to nasal cannula oxygen  Post-op Assessment: Report given to RN and Post -op Vital signs reviewed and stable  Post vital signs: Reviewed and stable  Last Vitals:  Vitals Value Taken Time  BP 140/64 06/14/22 1115  Temp    Pulse 85 06/14/22 1118  Resp 15 06/14/22 1118  SpO2 99 % 06/14/22 1118  Vitals shown include unvalidated device data.  Last Pain:  Vitals:   06/14/22 0631  TempSrc: Oral  PainSc: 0-No pain      Patients Stated Pain Goal: 5 (37/85/88 5027)  Complications: No notable events documented.

## 2022-06-14 NOTE — Anesthesia Procedure Notes (Signed)
Procedure Name: Intubation Date/Time: 06/14/2022 7:41 AM  Performed by: Georgeanne Nim, CRNAPre-anesthesia Checklist: Patient identified, Emergency Drugs available, Suction available, Patient being monitored and Timeout performed Patient Re-evaluated:Patient Re-evaluated prior to induction Oxygen Delivery Method: Circle system utilized Preoxygenation: Pre-oxygenation with 100% oxygen Induction Type: IV induction Ventilation: Mask ventilation without difficulty Laryngoscope Size: Mac and 4 Grade View: Grade I Tube type: Oral Tube size: 7.0 mm Number of attempts: 1 Airway Equipment and Method: Stylet Placement Confirmation: ETT inserted through vocal cords under direct vision, positive ETCO2, CO2 detector and breath sounds checked- equal and bilateral Secured at: 20 cm Tube secured with: Tape Dental Injury: Teeth and Oropharynx as per pre-operative assessment

## 2022-06-14 NOTE — Addendum Note (Signed)
Addendum  created 06/14/22 1352 by Georgeanne Nim, CRNA   Flowsheet accepted

## 2022-06-14 NOTE — Anesthesia Postprocedure Evaluation (Signed)
Anesthesia Post Note  Patient: Alicia Crawford  Procedure(s) Performed: XI ROBOTIC ASSISTED TOTAL HYSTERECTOMY WITH BILATERAL SALPINGECTOMY AND SACROCOLPOPEXY (Pelvis) TRANSVAGINAL TAPE (TVT) PROCEDURE (Vagina ) CYSTOSCOPY (Bladder)     Patient location during evaluation: PACU Anesthesia Type: General Level of consciousness: sedated Pain management: pain level controlled Vital Signs Assessment: post-procedure vital signs reviewed and stable Respiratory status: spontaneous breathing and respiratory function stable Cardiovascular status: stable Postop Assessment: no apparent nausea or vomiting Anesthetic complications: no   No notable events documented.  Last Vitals:  Vitals:   06/14/22 1200 06/14/22 1215  BP: 135/67 126/60  Pulse: 77 72  Resp: 15 15  Temp:  (!) 36.3 C  SpO2: 95% 92%    Last Pain:  Vitals:   06/14/22 1215  TempSrc:   PainSc: 0-No pain                 Hayes Rehfeldt DANIEL

## 2022-06-14 NOTE — Interval H&P Note (Signed)
History and Physical Interval Note:  06/14/2022 7:13 AM  Alicia Crawford  has presented today for surgery, with the diagnosis of uterovaginal prolapse, complete; anterior vaginal prolapse; posterior vaginal prolapse; stress urinary incontinence.  The various methods of treatment have been discussed with the patient and family. After consideration of risks, benefits and other options for treatment, the patient has consented to  Procedure(s) with comments: XI ROBOTIC ASSISTED TOTAL HYSTERECTOMY WITH BILATERAL SALPINGECTOMY AND SACROCOLPOPEXY (N/A) - total time requested is 3.5 hrs POSTERIOR REPAIR (RECTOCELE)---POSSIBLE (N/A) TRANSVAGINAL TAPE (TVT) PROCEDURE (N/A) CYSTOSCOPY (N/A) as a surgical intervention.  The patient's history has been reviewed, patient examined, no change in status, stable for surgery.  I have reviewed the patient's chart and labs.  Questions were answered to the patient's satisfaction.     Jaquita Folds

## 2022-06-14 NOTE — Op Note (Signed)
Operative Note  Preoperative Diagnosis: anterior vaginal prolapse, posterior vaginal prolapse, uterovaginal prolapse, complete, and stress urinary incontinence  Postoperative Diagnosis: same  Procedures performed:  Robotic assisted total laparoscopic hysterectomy, bilateral salpingo-oophorectomy, sacrocolpopexy Erenest Blank Lite Y mesh), midurethral sling (Advantage Fit), cystoscopy  Implants:  Implant Name Type Inv. Item Serial No. Manufacturer Lot No. LRB No. Used Action  MESH Valli Glance [id number] Mesh [employer] [place] [id number] N/A 1 Implanted  SYS SLING ADV FIT BLUE TRNSVAG - [id number] Sling SYS SLING ADV FIT BLUE TRNSVAG  Wilkie Aye [id number] N/A 1 Implanted    Attending Surgeon: Sherlene Shams, MD  Assistant Surgeon: Lyman Speller, MD  Anesthesia: General endotracheal  Findings: 1. On vaginal exam, stage IV prolapse noted  2. On laparsocopy, normal appearing uterus, fallopian tubes and left ovary. Right ovary had small cyst present.   3. On cystoscopy, normal bladder and urethra without injury, lesion or foreign body. Brisk bilateral ureteral efflux noted.    Specimens:  ID Type Source Tests Collected by Time Destination  1 : uterus, cervix, bilateral ovaries and fallopian tubes Tissue PATH Gyn benign resection SURGICAL PATHOLOGY Jaquita Folds, MD 06/14/2022 [phone]     Estimated blood loss: 150 mL  IV fluids: 1000 mL  Urine output: 50 mL  Complications: none  Procedure in Detail:  After informed consent was obtained, the patient was taken to the operating room, where general anesthesia was induced and found to be adequate. She was placed in dorsolithotomy position in yellowfin stirrups. Her hips were noted not to be hyperflexed or hyperextended. Her arms were padded with gel pads and tucked to her sides. Her hands were surrounded by foam. A padded strap was placed across her chest with foam between the pad  and her skin. She was noted to be appropriately positioned with all pressure points well padded and off tension. A tilt test showed no slippage. She was prepped and draped in the usual sterile fashion. A uterine manipulator was placed in the uterus after sounding to 7 cm, an appropriately sized Koh ring was placed around the cervix, and a pneumo-occluder balloon was positioned in the vagina for later use.  A sterile Foley catheter was inserted.   0.25% plain Marcaine was injected in the left upper quadrant at the midclavicular line and an 8 mm supraumbilical skin incision was made with the scalpel.  A Veress needle was inserted into the incision, CO2 insufflation was started, a low opening pressure was noted, and pneumoperitoneum was obtained. The Veress needle was removed and a 48m robotic trocar was placed. The robotic camera was inserted and intraperitoneal placement was confirmed. Survey of the abdomen and pelvis revealed the findings as noted above and no adhesions. The sacrum appeared to be free of any adhesive disease. After determining placement for the other ports, Local anesthetic was injected at each site for port placement. A port was placed in the supraumbilical area in the midline. Two 8 mm incisions were made for robotic ports at 10 cm lateral to and at the level of the umbilical port. Two additional 8 mm incisions were made 10 cm lateral to these and 30 degrees down followed by 8 mm robotic ports - the right side for an assistant port. All trocars were placed sequentially under direct visualization of the camera. The patient was placed in Trendelenburg. The robot was docked on the patient's left side. Monopolar endoshears alternating with vessel sealer were placed in the  right arm, a [place] bipolar grasper was placed in the 2nd arm of the patient's left side, and a Tip up grasper was placed in the 3rd arm on the patient's left side.   Attention was then turned to the robotic hysterectomy and  BSO. The ureter was identified and was found to be well away from the planned site of incision. Using the monopolar scissors, a window was made on the posterior leaf of the broad ligament. The infundibulopelvic ligament was cauterized and transected. The right round ligament was grasped, cauterized, and transected with electrocautery. The anterior and posterior leaves of the broad ligament were taken down with cautery and sharp dissection. The uterine artery was skeletonized and the bladder flap was created on the right side with a combination of electrosurgery and sharp dissection. The KOH ring was identified. The right uterine artery was clamped, cauterized, and transected. In a similar fashion, the left side was taken down. Further sharp dissection with combination of cautery was performed to further develop the bladder flap. At this point, the KOH ring was completely hugging the cervix. The pneumo-occluder balloon in the vagina was inflated to maintain pneumoperitoneum. A colpotomy was performed with electrosurgical cutting current and the uterus and cervix were completely amputated from the vagina. The specimen was delivered through the vagina. The posterior portion of the vaginal cuff was then grasped and pulled up to maintain pneumoperitoneum. The pneumo-occluder balloon was then replaced in the vagina. The right hand instrument was changed to a suture-cut needle driver. The vaginal cuff was then closed using a 0 V-lock suture.    The right hand instrument was replaced with monopolar endoshears. With a lucite probe in the vagina, the anterior vaginal dissection was then performed with sharp dissection and electrosurgery. The posterior vaginal dissection was then performed with sharp dissection and electrosurgery in order to dissect the rectum away from the posterior vagina. Attention was then turned to the sacral promontory. The peritoneum overlying the sacral promontory was tented up, dissected sharply  with monopolar scissors and electrosurgery using layer by layer technique. The peritoneal incision was extended down to the posterior cul-de-sac. This was performed with care to avoid the ureter on the right side and the sigmoid colon and its mesentary on the left side.  A "Y" mesh was then inserted into the abdomen after trimming to appropriate size. With the probe in the vagina, the anterior leaf of the Y mesh was affixed to the anterior portion of the vagina using a 2-0 v-loc suture in a spiral pattern to distribute the suture evenly across the surface of the anterior mesh leaf. In a similar fashion, the posterior leaf of the Y mesh was attached to the posterior surface of the vagina with 2-0 v-loc suture.  The distal end of the mesh was then brought to overlie the sacrum. The correct amount of tension was determined in order to elevate the vagina, but not put the mesh under tension. The distal end of the mesh was then affixed to the anterior longitudinal sacral ligament using two interrupted transverse stitches of CV2 Gortex. The excess distal mesh was then cut and removed. The peritoneum was reapproximated over the mesh using 2-0 monocryl. The bladder flap was incorporated to completely retroperitonealize the mesh. All pedicles were carefully inspected and noted to be hemostatic as the CO2 gas was deflated. All instruments were removed from the patient's abdomen.   The Foley catheter was removed.  A 70-degree cystoscope was introduced, and 360-degree inspection revealed no  injury, lesion or foreign body in the bladder. Brisk bilateral ureteral efflux was noted with the assistance of pyridium.  The bladder was drained and the cystoscope was removed.  The Foley catheter was replaced.  The robot was undocked. The CO2 gas was removed and the ports were removed.  The skin incisions were closed with subcutaneous stitches of 4-0 Monocryl and covered with skin glue.   Attention was then turned to the sling  portion of the procedure. The mid urethral area was located on the anterior vaginal wall.  Two Allis clamps were placed at the level of the midurethra. 1% lidocaine with epinephrine was injected into the vaginal mucosa. A vertical incision was made between the two clamps using a 15-blade scalpel.  Using sharp dissection, Metzenbaum scissors were used to make a periurethral tunnel from the vaginal incision towards the pubic rami bilaterally for the future sling tracts. The bladder was ensured to be empty. The trocar and attached sling were introduced into the right side of the periurethral vaginal incision, just inferior to the pubic symphysis on the right side. The trocar was guided through the endopelvic fascia and directly vertically.  While hugging the cephalad surface of the pubic bone, the trocar was guided out through the abdomen 2 fingerbreadths lateral to midline at the level of the pubic symphysis on the ipsilateral side. The trocar was placed on the left side in a similar fashion.  A 70-degree cystoscope was introduced, and 360-degree inspection revealed no trauma or trocars in the bladder, with brisk bilateral ureteral efflux.  The bladder was drained and the cystoscope was removed.  The Foley catheter was reinserted.  The sling was brought to lie beneath the mid-urethra.  A needle driver was placed behind the sling to ensure no tension.   The plastic sheath was removed from the sling and the distal ends of the sling were trimmed just below the level of the skin incisions.  Tension-free positioning of the sling was confirmed. Vaginal inspection revealed no vaginotomy or sling perforations of the mucosa.  The vaginal mucosal edges were reapproximated using 2-0 Vicryl.  The vagina was copiously irrigated.  Hemostasis was again noted. Vaginal packing not placed. The suprapubic sling incisions were closed with Dermabond. The patient tolerated the procedure well.  She was awakened from anesthesia and  transferred to the recovery room in stable condition. All needle and sponge counts were correct x 2.    Jaquita Folds, MD   Dr Ammie Ferrier assistance was required during the case due to the complexity of the procedure and need for a skilled assistant.

## 2022-06-14 NOTE — Addendum Note (Signed)
Addendum  created 06/14/22 1610 by Georgeanne Nim, CRNA   Flowsheet accepted

## 2022-06-14 NOTE — Anesthesia Preprocedure Evaluation (Addendum)
Anesthesia Evaluation  Patient identified by MRN, date of birth, ID band Patient awake    Reviewed: Allergy & Precautions, NPO status , Patient's Chart, lab work & pertinent test results  History of Anesthesia Complications (+) history of anesthetic complications  Airway Mallampati: II  TM Distance: >3 FB Neck ROM: Full    Dental no notable dental hx. (+) Dental Advisory Given   Pulmonary neg shortness of breath, sleep apnea , former smoker,    Pulmonary exam normal        Cardiovascular hypertension, Pt. on medications and Pt. on home beta blockers (-) angina+ CAD, + Past MI, + CABG and + Peripheral Vascular Disease  Normal cardiovascular exam  EF 50-55% 2012  Neg stress 2014   Neuro/Psych PSYCHIATRIC DISORDERS Anxiety Depression    GI/Hepatic Neg liver ROS, GERD  Medicated,  Endo/Other  diabetes      Renal/GU ARFRenal disease     Musculoskeletal   Abdominal   Peds  Hematology   Anesthesia Other Findings   Reproductive/Obstetrics                            Anesthesia Physical  Anesthesia Plan  ASA: 3  Anesthesia Plan: General   Post-op Pain Management: Tylenol PO (pre-op)*, Toradol IV (intra-op)* and Ketamine IV*   Induction: Intravenous  PONV Risk Score and Plan: 4 or greater and Ondansetron, Dexamethasone, Midazolam and Scopolamine patch - Pre-op  Airway Management Planned: Oral ETT  Additional Equipment: None  Intra-op Plan:   Post-operative Plan: Extubation in OR  Informed Consent: I have reviewed the patients History and Physical, chart, labs and discussed the procedure including the risks, benefits and alternatives for the proposed anesthesia with the patient or authorized representative who has indicated his/her understanding and acceptance.     Dental advisory given  Plan Discussed with: Anesthesiologist and CRNA  Anesthesia Plan Comments:        Anesthesia Quick Evaluation

## 2022-06-14 NOTE — Discharge Instructions (Signed)

## 2022-06-15 ENCOUNTER — Encounter (HOSPITAL_BASED_OUTPATIENT_CLINIC_OR_DEPARTMENT_OTHER): Payer: Self-pay | Admitting: Obstetrics and Gynecology

## 2022-06-15 ENCOUNTER — Telehealth: Payer: Self-pay | Admitting: Obstetrics and Gynecology

## 2022-06-15 LAB — SURGICAL PATHOLOGY

## 2022-06-15 NOTE — Telephone Encounter (Signed)
Alicia Crawford underwent robotic assisted TLH, BSO, Sacrocolpopexy, midurethral sling, cystoscopy on 06/14/22.   She passed her voiding trial.  313m was backfilled into the bladder Voided 1763m PVR by bladder scan was 13576m  She was discharged without a catheter. Please call her for a routine post op check. Thanks!  MicJaquita FoldsD

## 2022-06-16 ENCOUNTER — Encounter: Payer: Self-pay | Admitting: *Deleted

## 2022-06-16 ENCOUNTER — Ambulatory Visit (INDEPENDENT_AMBULATORY_CARE_PROVIDER_SITE_OTHER): Payer: PPO

## 2022-06-16 DIAGNOSIS — Z4889 Encounter for other specified surgical aftercare: Secondary | ICD-10-CM

## 2022-06-16 NOTE — Telephone Encounter (Addendum)
Alicia Crawford is a 66 y.o. female was contacted for a post op call. As I started asking questions pt said she can not urinate and she feels bloated. I asked the pt when was the last time she went to the bathroom and she said she has not gone on her won just leaking. Pt said she involuntary leaks but can not urinate. Pt was scheduled for  a nurse visit.

## 2022-06-16 NOTE — Progress Notes (Signed)
Alicia Crawford is a 66 y.o. female here for a bladder can because pt said she can not urinate. Pt was asked again when was the last time she urinated and she said she goes to the restroom 20x per day. Pt then said she leaks. I explain to the patient that she told me should could not urinate and she said "sorry, she wanted to make sure" Pt then said she can not urinate on her own but she does leak a lot.Catheter was not replaced. Pt was scanned for 15cc in her bladder. Pt was encourage to keep all post op appointment.

## 2022-07-04 ENCOUNTER — Encounter (HOSPITAL_COMMUNITY): Payer: Self-pay

## 2022-07-04 ENCOUNTER — Ambulatory Visit (HOSPITAL_COMMUNITY)
Admission: EM | Admit: 2022-07-04 | Discharge: 2022-07-04 | Disposition: A | Discharge status: Home / Self Care | Payer: PPO | Attending: Emergency Medicine | Admitting: Emergency Medicine

## 2022-07-04 DIAGNOSIS — Z90710 Acquired absence of both cervix and uterus: Secondary | ICD-10-CM | POA: Diagnosis not present

## 2022-07-04 DIAGNOSIS — N3001 Acute cystitis with hematuria: Secondary | ICD-10-CM | POA: Diagnosis not present

## 2022-07-04 DIAGNOSIS — Z9889 Other specified postprocedural states: Secondary | ICD-10-CM | POA: Diagnosis not present

## 2022-07-04 LAB — POCT URINALYSIS DIPSTICK, ED / UC
Bilirubin Urine: NEGATIVE
Glucose, UA: NEGATIVE mg/dL
Ketones, ur: NEGATIVE mg/dL
Nitrite: POSITIVE — AB
Protein, ur: 100 mg/dL — AB
Specific Gravity, Urine: 1.02 (ref 1.005–1.030)
Urobilinogen, UA: 0.2 mg/dL (ref 0.0–1.0)
pH: 5.5 (ref 5.0–8.0)

## 2022-07-04 MED ORDER — CEPHALEXIN 500 MG PO CAPS: 500.0000 mg | ORAL_CAPSULE | Freq: Three times a day (TID) | ORAL | 0 refills | Status: AC

## 2022-07-04 NOTE — ED Triage Notes (Signed)
Pt c/o burning on urination, bloody, and hard to start a stream x4-5 days. States had her bladder tact up a month ago.

## 2022-07-04 NOTE — ED Provider Notes (Signed)
[place]    CSN: [id number] Arrival date & time: 07/04/22  1032      History    Chief Complaint  Patient presents with   Dysuria    HPI Alicia Crawford is a 66 y.o. female.  Presents with 4-day history of dysuria, delayed stream, hematuria Reports bladder discomfort with urination, sometimes hard to start a stream "Spotting" with urination  Denies fever, abdominal pain, flank pain.No nausea/vomiting. Normal BMs  9/12 had total hysterectomy and transvaginal taping History of kidney disease, renal artery stenosis, uterovaginal prolapse  Past Medical History:  Diagnosis Date   Ankle fracture 09/05/2016   right ankle   Anoxic brain injury ([place]) 08/2010   "w/cardiac arrest"   Anxiety 10/2010   panic disorder   Bilateral hypertensive retinopathy 2020   Blood transfusion 09/2010   Cardiac arrest ([place]) 08/04/2010   witnessed cardiac arrest, with CPR & defibrillation, and ultimately coronary artery bypass grafting on 09/27/2010. She had a prolonged hospitalization on a ventilalator and rehab.   Carotid artery occlusion    hx of moderate bilateral ICA stenosis by duplex ultrasound 06/02/2021   Chronic kidney disease    stage 3   Claudication [facility])    life-style limiting claudication   Complication of anesthesia    [place], given in the 1980's, affected liver   Coronary artery disease    CABG 2011 in [place], Pt follows with Dr. Quay Burow, cardiologist, [place] 05/17/22 in [place].   Depression    GERD (gastroesophageal reflux disease)    Hepatitis    1980's due to being given Halothane during anesthesia per pt   High cholesterol    Hyperkalemia 03/2021   03/18/21 K+ 6.5   Hypertension    Follows with cardiologist, Quay Burow, MD., [place] 05/17/22 in [place].   Myocardial infarction [facility]) 09/27/2010   in [place]   Obesity    Peripheral vascular disease ([place])    bilat SFA diseae   Renal artery stenosis (HCC)    35% right renal artery stenosis,  revascularization was recommended, however patient fell and developed a subdural hematoma, workup was put on hold, medical therapy was recommended per 05/17/22 cardiology OV note in Epic from Dr. Quay Burow.   Rotator cuff tendonitis    left   Sleep apnea    uses CPAP nightly   Stroke [facility]) 2011   no residual deficits   Subdural hematoma (HCC) 02/25/2012   Frontal subdural  S/P evacuation of hematoma   Type II diabetes mellitus (HCC)    takes Metformin   UTI (urinary tract infection) 03/2021   hospitalized for acute kidney injury, was found to have a Klebsiella UTI   Wears glasses    reading only    Patient Active Problem List   Diagnosis Date Noted   Uterovaginal prolapse, complete 06/14/2022   H/O total shoulder replacement, left 12/31/2021   Hyperkalemia 03/20/2021   Coronary artery disease involving native heart without angina pectoris 03/20/2021   Acute renal failure superimposed on stage 2 chronic kidney disease ([place]) 03/06/2021   OSA (obstructive sleep apnea) 08/20/2020   Healthcare maintenance 08/20/2020   Chronic cough 05/28/2020   MDD (major depressive disorder) 10/15/2018   Peripheral vascular disease, unspecified ([place]) 03/14/2014   Aftercare following surgery of the circulatory system, NEC 03/14/2014   Occlusion and stenosis of carotid artery without mention of cerebral infarction 02/11/2013   SDH (subdural hematoma) ([place]) 03/02/2012   S/P craniotomy, 02/25/12 after fall, SDH 02/27/2012   Bradycardia  02/27/2012   Obesity 02/27/2012   Diabetes mellitus 02/27/2012   CAD, cardiac arrest, CPR, CABG [facility] Dec 2011-NL LVF by Echo, neg Nuc April 2013 02/27/2012   Anxiety, ? residual hypoxic brain inhury from cardiac arrest 2011 02/27/2012   Peripheral artery disease, bilta LE PVD by angio April 2013 01/18/2012   HLD (hyperlipidemia) 01/18/2012   Essential hypertension 01/18/2012   Renal artery stenosis, Right, 75% 01/18/2012    Past Surgical History:   Procedure Laterality Date   ABDOMINOPLASTY/PANNICULECTOMY  2000   Had wound complication with MRSA after   ATHERECTOMY N/A 01/17/2012   Procedure: ATHERECTOMY;  Surgeon: Lorretta Harp, MD;  Location: [facility] CATH LAB;  Service: Cardiovascular;  Laterality: N/A;   BLADDER SUSPENSION N/A 06/14/2022   Procedure: TRANSVAGINAL TAPE (TVT) PROCEDURE;  Surgeon: Jaquita Folds, MD;  Location: [facility];  Service: Gynecology;  Laterality: N/A;   CHOLECYSTECTOMY  ~ 1980's   CORONARY ARTERY BYPASS GRAFT  09/2010   CABG X4   CRANIOTOMY  02/25/2012   Procedure: CRANIOTOMY HEMATOMA EVACUATION SUBDURAL;  Surgeon: Eustace Moore, MD;  Location: [place] NEURO ORS;  Service: Neurosurgery;  Laterality: Left;  Left craniotomy for evacuation of subdural hematoma   CYSTOSCOPY N/A 06/14/2022   Procedure: CYSTOSCOPY;  Surgeon: Jaquita Folds, MD;  Location[facility];  Service: Gynecology;  Laterality: N/A;   LOWER EXTREMITY ANGIOGRAM  01/2012   ORIF TOE FRACTURE Left 04/25/2019   Procedure: Open Reduction Internal Fixation (ORIF) Left Fifth Metatarsal Ronnald Ramp Fracture;  Surgeon: Wylene Simmer, MD;  Location: [place];  Service: Orthopedics;  Laterality: Left;   OVARIAN CYST REMOVAL  1983   POSTERIOR FUSION LUMBAR SPINE  ` 2000   REVERSE SHOULDER ARTHROPLASTY Left 12/31/2021   Procedure: REVERSE SHOULDER ARTHROPLASTY;  Surgeon: Netta Cedars, MD;  Location: [facility] ORS;  Service: Orthopedics;  Laterality: Left;   XI ROBOTIC ASSISTED TOTAL HYSTERECTOMY WITH SACROCOLPOPEXY N/A 06/14/2022   Procedure: XI ROBOTIC ASSISTED TOTAL HYSTERECTOMY WITH BILATERAL SALPINGECTOMY AND SACROCOLPOPEXY;  Surgeon: Jaquita Folds, MD;  [facility];  Service: Gynecology;  Laterality: N/A;  total time requested is 3.5 hrs    OB History     Gravida  2   Para      Term      Preterm      AB      Living  2      SAB      IAB      Ectopic       Multiple      Live Births               Home Medications    Prior to Admission medications   Medication Sig Start Date End Date Taking? Authorizing Provider  cephALEXin (KEFLEX) 500 MG capsule Take 1 capsule (500 mg total) by mouth 3 (three) times daily for 5 days. 07/04/22 07/09/22 Yes Cartina Brousseau, Wells Guiles, PA-C  acetaminophen (TYLENOL) 500 MG tablet Take 1 tablet (500 mg total) by mouth every 6 (six) hours as needed (pain). 05/25/22   Jaquita Folds, MD  ALPRAZolam Duanne Moron) 0.5 MG tablet Take 1/2-1 tablet twice daily as needed for severe anxiety 06/13/22   Thayer Headings, PMHNP  amLODipine (NORVASC) 5 MG tablet TAKE 1 TABLET(5 MG) BY MOUTH DAILY 06/10/22   Lorretta Harp, MD  Ascorbic Acid (VITAMIN C) 100 MG tablet Take 100 mg by mouth daily.    [provider]  atorvastatin (  LIPITOR) 80 MG tablet TAKE 1 TABLET BY MOUTH DAILY 01/10/22   Lorretta Harp, MD  cholecalciferol (VITAMIN D) 25 MCG (1000 UNIT) tablet Take 1,000 Units by mouth daily.    [provider]  cilostazol (PLETAL) 100 MG tablet TAKE 1 TABLET BY MOUTH TWICE DAILY BEFORE MEALS 05/11/22   Lorretta Harp, MD  Coenzyme Q10 300 MG CAPS Take 300 mg by mouth daily.    [provider]  estradiol (ESTRACE) 0.1 MG/GM vaginal cream Place 0.5 g vaginally 2 (two) times a week. 03/22/21   Arrien, Jimmy Picket, MD  Evolocumab (REPATHA SURECLICK) 599 MG/ML SOAJ Inject 140 mg into the skin every 14 (fourteen) days. Patient taking differently: Inject 140 mg into the skin every 14 (fourteen) days. Every other Sunday 09/14/21   Lorretta Harp, MD  ibuprofen (ADVIL) 600 MG tablet Take 1 tablet (600 mg total) by mouth every 6 (six) hours as needed. 05/25/22   Jaquita Folds, MD  magnesium oxide (MAG-OX) 400 MG tablet Take 400 mg by mouth 2 (two) times daily.    [provider]  melatonin 5 MG TABS Take 5 mg by mouth at bedtime as needed (sleep).    [provider]  metFORMIN  (GLUCOPHAGE) 1000 MG tablet Take 1,000 mg by mouth 2 (two) times daily with a meal. 01/05/15   [provider]  metoprolol tartrate (LOPRESSOR) 25 MG tablet TAKE 1 TABLET(25 MG) BY MOUTH TWICE DAILY Patient taking differently: Take 25 mg by mouth 2 (two) times daily. 10/14/21   Lorretta Harp, MD  ondansetron (ZOFRAN) 4 MG tablet Take 1 tablet (4 mg total) by mouth every 8 (eight) hours as needed for nausea, vomiting or refractory nausea / vomiting. 12/31/21 12/31/22  Netta Cedars, MD  oxyCODONE (OXY IR/ROXICODONE) 5 MG immediate release tablet Take 1 tablet (5 mg total) by mouth every 4 (four) hours as needed for severe pain. 05/25/22   Jaquita Folds, MD  pantoprazole (PROTONIX) 40 MG tablet TAKE 1 TABLET(40 MG) BY MOUTH DAILY Patient taking differently: Take 40 mg by mouth daily. 08/27/21   Collene Gobble, MD  polyethylene glycol powder (GLYCOLAX/MIRALAX) 17 GM/SCOOP powder Take 17 g by mouth daily. Drink 17g (1 scoop) dissolved in water per day. 05/25/22   Jaquita Folds, MD  PREBIOTIC PRODUCT PO Take by mouth.    [provider]  Probiotic Product (PROBIOTIC BLEND PO) Take by mouth.    [provider]  senna-docusate (SENOKOT-S) 8.6-50 MG tablet Take 1 tablet by mouth at bedtime as needed for mild constipation or moderate constipation. 12/29/21   Long, Wonda Olds, MD  sertraline (ZOLOFT) 100 MG tablet Take 1 tablet (100 mg total) by mouth in the morning and at bedtime. 03/07/22 06/14/22  Thayer Headings, PMHNP  Trospium Chloride 60 MG CP24 Take 1 capsule (60 mg total) by mouth daily. 02/23/22   Jaquita Folds, MD  zinc gluconate 50 MG tablet Take 50 mg by mouth daily.    [provider]    Family History Family History  Problem Relation Age of Onset   Heart disease Mother        before age 61   Diabetes Mother    Hyperlipidemia Mother    Hypertension Mother    Heart attack Mother    Heart disease Father        Before age 58    Hyperlipidemia Father    Hypertension Father    Heart attack Father  Heart disease Sister        Before age 73   Heart attack Sister    Cancer - Colon Sister    Hyperlipidemia Brother    Hypertension Brother    Heart attack Brother    Heart disease Brother        before age 78   Heart disease Other    Diabetes Other    Hypertension Other    Alcohol abuse Other    Mental illness Other     Social History Social History   Tobacco Use   Smoking status: Former    Packs/day: 1.00    Years: 30.00    Total pack years: 30.00    Types: Cigarettes    Quit date: 06/23/2003    Years since quitting: 19.0   Smokeless tobacco: Never  Vaping Use   Vaping Use: Never used  Substance Use Topics   Alcohol use: Not Currently   Drug use: No     Allergies   Halothane   Review of Systems Review of Systems  Per HPI  Physical Exam Triage Vital Signs ED Triage Vitals  Enc Vitals Group     BP 07/04/22 1137 (!) 161/78     Pulse Rate 07/04/22 1137 92     Resp 07/04/22 1137 18     Temp 07/04/22 1137 98.5 F (36.9 C)     Temp Source 07/04/22 1137 Oral     SpO2 07/04/22 1137 96 %     Weight --      Height --      Head Circumference --      Peak Flow --      Pain Score 07/04/22 1138 0     Pain Loc --      Pain Edu? --      Excl. in [place]? --    No data found.  Updated Vital Signs BP (!) 161/78 (BP Location: Left Arm)   Pulse 92   Temp 98.5 F (36.9 C) (Oral)   Resp 18   SpO2 96%   Physical Exam Vitals and nursing note reviewed.  Constitutional:      General: She is not in acute distress.    Appearance: She is not ill-appearing.  HENT:     Mouth/Throat:     Mouth: Mucous membranes are moist.     Pharynx: Oropharynx is clear.  Eyes:     Conjunctiva/sclera: Conjunctivae normal.     Pupils: Pupils are equal, round, and reactive to light.  Cardiovascular:     Rate and Rhythm: Normal rate and regular rhythm.     Heart sounds: Normal heart sounds.  Pulmonary:      Effort: Pulmonary effort is normal.     Breath sounds: Normal breath sounds.  Abdominal:     General: Bowel sounds are normal.     Palpations: Abdomen is soft.     Tenderness: There is abdominal tenderness. There is no right CVA tenderness or left CVA tenderness.     Comments: Mildly tender left lower quadrant  Neurological:     Mental Status: She is alert and oriented to person, place, and time.      UC Treatments / Results  Labs (all labs ordered are listed, but only abnormal results are displayed) Labs Reviewed  POCT URINALYSIS DIPSTICK, ED / UC - Abnormal; Notable for the following components:      Result Value   Hgb urine dipstick MODERATE (*)    Protein, ur 100 (*)  Nitrite POSITIVE (*)    Leukocytes,Ua MODERATE (*)    All other components within normal limits  URINE CULTURE    EKG   Radiology No results found.  Procedures Procedures (including critical care time)  Medications Ordered in UC Medications - No data to display  Initial Impression / Assessment and Plan / UC Course  I have reviewed the triage vital signs and the nursing notes.  Pertinent labs & imaging results that were available during my care of the patient were reviewed by me and considered in my medical decision making (see chart for details).  Well-appearing, no acute distress, afebrile Urinalysis with moderate hemoglobin, positive nitrates, moderate leuks.  Culture pending Treat with Keflex 3 times daily for 5 days. Discussed close follow-up with urologist, patient will call today regarding symptoms. Strict ED precautions discussed.  Patient agrees to plan  Final Clinical Impressions(s) / UC Diagnoses   Final diagnoses:  Acute cystitis with hematuria  History of bladder surgery     Discharge Instructions      Please take medication as prescribed. Take with food to avoid upset stomach. Increase your fluid intake.   I recommend contacting your urologist regarding your  symptoms. Please monitor your symptoms and go to the emergency department if they worsen.      ED Prescriptions     Medication Sig Dispense Auth. Provider   cephALEXin (KEFLEX) 500 MG capsule Take 1 capsule (500 mg total) by mouth 3 (three) times daily for 5 days. 15 capsule Gal Feldhaus, Wells Guiles, PA-C      PDMP not reviewed this encounter.   Kyra Leyland 07/04/22 1251

## 2022-07-04 NOTE — Discharge Instructions (Signed)
Please take medication as prescribed. Take with food to avoid upset stomach. Increase your fluid intake.   I recommend contacting your urologist regarding your symptoms. Please monitor your symptoms and go to the emergency department if they worsen.

## 2022-07-05 ENCOUNTER — Telehealth: Payer: Self-pay | Admitting: Psychiatry

## 2022-07-05 ENCOUNTER — Other Ambulatory Visit: Payer: Self-pay

## 2022-07-05 DIAGNOSIS — F3342 Major depressive disorder, recurrent, in full remission: Secondary | ICD-10-CM

## 2022-07-05 DIAGNOSIS — F410 Panic disorder [episodic paroxysmal anxiety] without agoraphobia: Secondary | ICD-10-CM

## 2022-07-05 MED ORDER — SERTRALINE HCL 100 MG PO TABS: 100.0000 mg | ORAL_TABLET | Freq: Two times a day (BID) | ORAL | 0 refills | Status: DC

## 2022-07-05 NOTE — Telephone Encounter (Signed)
Pt LVM @ 10:10a.  She would like a refill of Sertraline sent to [facility] [address].  Next appt 12/6

## 2022-07-05 NOTE — Telephone Encounter (Signed)
Rx sent 

## 2022-07-06 LAB — URINE CULTURE: Culture: >=100000 — AB

## 2022-07-26 ENCOUNTER — Encounter: Payer: PPO | Admitting: Obstetrics and Gynecology

## 2022-07-29 ENCOUNTER — Encounter: Payer: PPO | Admitting: Obstetrics and Gynecology

## 2022-08-02 ENCOUNTER — Other Ambulatory Visit: Payer: Self-pay | Admitting: Cardiovascular Disease

## 2022-08-05 ENCOUNTER — Ambulatory Visit (INDEPENDENT_AMBULATORY_CARE_PROVIDER_SITE_OTHER): Payer: PPO | Admitting: Obstetrics and Gynecology

## 2022-08-05 ENCOUNTER — Encounter: Payer: Self-pay | Admitting: Obstetrics and Gynecology

## 2022-08-05 VITALS — BP 108/68 | HR 70 | Wt 137.0 lb

## 2022-08-05 DIAGNOSIS — Z9889 Other specified postprocedural states: Secondary | ICD-10-CM

## 2022-08-05 NOTE — Progress Notes (Signed)
[facility]  Date of Visit: 08/05/2022  History of Present Illness: Alicia Crawford is a 66 y.o. female scheduled today for a post-operative visit.   Surgery: s/p Robotic assisted total laparoscopic hysterectomy, bilateral salpingo-oophorectomy, sacrocolpopexy Erenest Blank Lite Y mesh), midurethral sling (Advantage Fit), cystoscopy on 06/14/22  She passed her postoperative void trial.   Postoperative course has been uncomplicated.   Today she reports she is doing well.   UTI in the last 6 weeks? Yes - went to urgent care 10/2 and was treated. Culture returned + E.Coli.  Pain? No  She has returned to her normal activity (except for postop restrictions) Vaginal bulge? No  Stress incontinence: Yes - very tiny amount with coughing but not needing to wear a big pad anymore.  Urgency/frequency: No  Urge incontinence: No  Voiding dysfunction: No  Bowel issues: No   Subjective Success: Do you usually have a bulge or something falling out that you can see or feel in the vaginal area? No  Retreatment Success: Any retreatment with surgery or pessary for any compartment? 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  - Benign inactive endometrium  - Benign unremarkable cervix  - Benign mucinous cystadenoma of right ovary, 2.5 cm, see comment  - Benign unremarkable bilateral fallopian tubes  - No evidence of malignancy    Medications: She has a current medication list which includes the following prescription(s): acetaminophen, alprazolam, amlodipine, vitamin c, atorvastatin, cholecalciferol, cilostazol, coenzyme q10, estradiol, repatha sureclick, ibuprofen, magnesium oxide, melatonin, metformin, metoprolol tartrate, ondansetron, pantoprazole, polyethylene glycol powder, prebiotic product, probiotic product, senna-docusate, sertraline, trospium chloride, and zinc gluconate.   Allergies: Patient is allergic to  halothane.   Physical Exam: BP 108/68   Pulse 70   Wt 137 lb (62.1 kg)   BMI 23.52 kg/m   Abdomen: soft, non-tender, without masses or organomegaly Laparoscopic Incisions: well healed.  Pelvic Examination: Vagina: Incisions healing well. One suture is present on posterior vaginal wall. Cuff well healed. No tenderness along the anterior or posterior vagina. No apical tenderness. No pelvic masses. No visible or palpable mesh.  POP-Q: POP-Q  -3                                            Aa   -3                                           Ba  -9                                              C   3                                            Gh  5.5  Pb  9                                            tvl   -2.5                                            Ap  -2.5                                            Bp                                                 D     ---------------------------------------------------------  Assessment and Plan:  1. Post-operative state     - Pathology results were reviewed with the patient today and she verbalized understanding that the results were benign.  - Suture present on posterior vaginal wall will dissolve over time. Not bothersome to her currently.  - Can resume regular activity including exercise. Wait an additional 6 weeks for intercourse.  - Discussed avoidance of heavy lifting and straining long term to reduce the risk of recurrence.   Follow up as needed  Jaquita Folds, MD

## 2022-08-29 ENCOUNTER — Other Ambulatory Visit (HOSPITAL_COMMUNITY): Payer: Self-pay | Admitting: Cardiovascular Disease

## 2022-08-29 DIAGNOSIS — I6522 Occlusion and stenosis of left carotid artery: Secondary | ICD-10-CM

## 2022-09-07 ENCOUNTER — Encounter: Payer: Self-pay | Admitting: Psychiatry

## 2022-09-07 ENCOUNTER — Ambulatory Visit (INDEPENDENT_AMBULATORY_CARE_PROVIDER_SITE_OTHER): Payer: PPO | Admitting: Psychiatry

## 2022-09-07 VITALS — Wt 140.0 lb

## 2022-09-07 DIAGNOSIS — F3342 Major depressive disorder, recurrent, in full remission: Secondary | ICD-10-CM

## 2022-09-07 DIAGNOSIS — F410 Panic disorder [episodic paroxysmal anxiety] without agoraphobia: Secondary | ICD-10-CM

## 2022-09-07 DIAGNOSIS — G4700 Insomnia, unspecified: Secondary | ICD-10-CM | POA: Diagnosis not present

## 2022-09-07 MED ORDER — SERTRALINE HCL 100 MG PO TABS: 100.0000 mg | ORAL_TABLET | Freq: Two times a day (BID) | ORAL | 1 refills | Status: DC

## 2022-09-07 NOTE — Progress Notes (Signed)
KINDRED [name] [id number] 11-28-1955 66 y.o.  Virtual Visit via Telephone Note  I connected with pt on 09/07/22 at 10:00 AM EST by telephone and verified that I am speaking with the correct person using two identifiers.   I discussed the limitations, risks, security and privacy concerns of performing an evaluation and management service by telephone and the availability of in person appointments. I also discussed with the patient that there may be a patient responsible charge related to this service. The patient expressed understanding and agreed to proceed.   I discussed the assessment and treatment plan with the patient. The patient was provided an opportunity to ask questions and all were answered. The patient agreed with the plan and demonstrated an understanding of the instructions.   The patient was advised to call back or seek an in-person evaluation if the symptoms worsen or if the condition fails to improve as anticipated.  I provided 19 minutes of non-face-to-face time during this encounter.  The patient was located at home.  The provider was located at [place].   Thayer Headings, PMHNP   Subjective:   Patient ID:  Alicia Crawford is a 66 y.o. (DOB 1955/10/12) female.  Chief Complaint:  Chief Complaint  Patient presents with   Follow-up    Depression, anxiety, and insomnia    HPI Alicia Crawford presents for follow-up of anxiety and depression. Pt reports, "All is well." Had hysterectomy and repair of prolapse. "I am doing 100% better now... I am feeling so much more alive." She reports that she is much more active- walking dogs several times and do more house work. Denies depressed mood. She reports that she had some increase anxiety. She reports that anxiety has resolved. She reports that she takes Alprazolam 0.5 mg 1/2 tab every other night. She is now sleeping in the bed instead of the recliner. Sleep is "much better" now that shoulder pain has improved. Energy and  motivation have significantly improved. She reports, "my concentration isn't what it used to be." She has been reading several books. She is doing Bible puzzle books. She has not been sewing or crocheting. She reports some weight loss. Appetite has been stable. Denies SI.   Sister's cancer is now in remission.   Alprazolam last filled 06/13/22.   Past Psychiatric Medication Trials: Xanax- Helpful Ativan Sertraline- started in 2013 Prozac- took for years and then stopped working Topamax Trazodone- Effective, well-tolerated  Review of Systems:  Review of Systems  Genitourinary:  Negative for dysuria and frequency.  Musculoskeletal:  Negative for gait problem.  Psychiatric/Behavioral:         Please refer to HPI    Medications: I have reviewed the patient's current medications.  Current Outpatient Medications  Medication Sig Dispense Refill   ALPRAZolam (XANAX) 0.5 MG tablet Take 1/2-1 tablet twice daily as needed for severe anxiety 60 tablet 2   amLODipine (NORVASC) 5 MG tablet TAKE 1 TABLET(5 MG) BY MOUTH DAILY 90 tablet 3   Ascorbic Acid (VITAMIN C) 100 MG tablet Take 100 mg by mouth daily.     atorvastatin (LIPITOR) 80 MG tablet TAKE 1 TABLET BY MOUTH DAILY 90 tablet 3   cholecalciferol (VITAMIN D) 25 MCG (1000 UNIT) tablet Take 1,000 Units by mouth daily.     cilostazol (PLETAL) 100 MG tablet TAKE 1 TABLET BY MOUTH TWICE DAILY BEFORE MEALS 180 tablet 3   Coenzyme Q10 300 MG CAPS Take 300 mg by mouth daily.     Evolocumab (  REPATHA SURECLICK) 315 MG/ML SOAJ Inject 140 mg into the skin every 14 (fourteen) days. (Patient taking differently: Inject 140 mg into the skin every 14 (fourteen) days. Every other Sunday) 2 mL 11   magnesium oxide (MAG-OX) 400 MG tablet Take 400 mg by mouth 2 (two) times daily.     melatonin 5 MG TABS Take 5 mg by mouth at bedtime as needed (sleep).     metFORMIN (GLUCOPHAGE) 1000 MG tablet Take 1,000 mg by mouth daily.  0   metoprolol tartrate (LOPRESSOR)  25 MG tablet TAKE 1 TABLET(25 MG) BY MOUTH TWICE DAILY (Patient taking differently: Take 25 mg by mouth 2 (two) times daily.) 180 tablet 3   PREBIOTIC PRODUCT PO Take by mouth.     Probiotic Product (PROBIOTIC BLEND PO) Take by mouth.     zinc gluconate 50 MG tablet Take 50 mg by mouth daily.     acetaminophen (TYLENOL) 500 MG tablet Take 1 tablet (500 mg total) by mouth every 6 (six) hours as needed (pain). (Patient not taking: Reported on 09/07/2022) 30 tablet 0   estradiol (ESTRACE) 0.1 MG/GM vaginal cream Place 0.5 g vaginally 2 (two) times a week. (Patient not taking: Reported on 09/07/2022)     ibuprofen (ADVIL) 600 MG tablet Take 1 tablet (600 mg total) by mouth every 6 (six) hours as needed. 30 tablet 0   ondansetron (ZOFRAN) 4 MG tablet Take 1 tablet (4 mg total) by mouth every 8 (eight) hours as needed for nausea, vomiting or refractory nausea / vomiting. (Patient not taking: Reported on 09/07/2022) 30 tablet 1   pantoprazole (PROTONIX) 40 MG tablet TAKE 1 TABLET(40 MG) BY MOUTH DAILY (Patient not taking: Reported on 09/07/2022) 90 tablet 3   polyethylene glycol powder (GLYCOLAX/MIRALAX) 17 GM/SCOOP powder Take 17 g by mouth daily. Drink 17g (1 scoop) dissolved in water per day. (Patient not taking: Reported on 09/07/2022) 255 g 0   senna-docusate (SENOKOT-S) 8.6-50 MG tablet Take 1 tablet by mouth at bedtime as needed for mild constipation or moderate constipation. (Patient not taking: Reported on 09/07/2022) 20 tablet 0   sertraline (ZOLOFT) 100 MG tablet Take 1 tablet (100 mg total) by mouth in the morning and at bedtime. 180 tablet 1   Trospium Chloride 60 MG CP24 Take 1 capsule (60 mg total) by mouth daily. (Patient not taking: Reported on 09/07/2022) 30 capsule 5   No current facility-administered medications for this visit.    Medication Side Effects: None  Allergies:  Allergies  Allergen Reactions   Halothane Hives and Other (See Comments)    Gas used 1980's Reaction: affected her  liver    Past Medical History:  Diagnosis Date   Ankle fracture 09/05/2016   right ankle   Anoxic brain injury ([place]) 08/2010   "w/cardiac arrest"   Anxiety 10/2010   panic disorder   Bilateral hypertensive retinopathy 2020   Blood transfusion 09/2010   Cardiac arrest ([place]) 08/04/2010   witnessed cardiac arrest, with CPR & defibrillation, and ultimately coronary artery bypass grafting on 09/27/2010. She had a prolonged hospitalization on a ventilalator and rehab.   Carotid artery occlusion    hx of moderate bilateral ICA stenosis by duplex ultrasound 06/02/2021   Chronic kidney disease    stage 3   Claudication [facility])    life-style limiting claudication   Complication of anesthesia    [place], given in the 1980's, affected liver   Coronary artery disease    CABG 2011 in [place], [place] follows  with Dr. Quay Burow, cardiologist, [place] 05/17/22 in [place].   Depression    GERD (gastroesophageal reflux disease)    Hepatitis    1980's due to being given Halothane during anesthesia per pt   High cholesterol    Hyperkalemia 03/2021   03/18/21 K+ 6.5   Hypertension    Follows with cardiologist, Quay Burow, MD., [place] 05/17/22 in [place].   Myocardial infarction [facility] 09/27/2010   in [place]   Obesity    Peripheral vascular disease ([place])    bilat SFA diseae   Renal artery stenosis (HCC)    35% right renal artery stenosis, revascularization was recommended, however patient fell and developed a subdural hematoma, workup was put on hold, medical therapy was recommended per 05/17/22 cardiology OV note in Epic from Dr. Quay Burow.   Rotator cuff tendonitis    left   Sleep apnea    uses CPAP nightly   Stroke ([place]) 2011   no residual deficits   Subdural hematoma (HCC) 02/25/2012   Frontal subdural  S/P evacuation of hematoma   Type II diabetes mellitus (HCC)    takes Metformin   UTI (urinary tract infection) 03/2021   hospitalized for acute kidney injury, was found to have a  Klebsiella UTI   Wears glasses    reading only    Family History  Problem Relation Age of Onset   Heart disease Mother        before age 110   Diabetes Mother    Hyperlipidemia Mother    Hypertension Mother    Heart attack Mother    Heart disease Father        Before age 63   Hyperlipidemia Father    Hypertension Father    Heart attack Father    Heart disease Sister        Before age 30   Heart attack Sister    Cancer - Colon Sister    Hyperlipidemia Brother    Hypertension Brother    Heart attack Brother    Heart disease Brother        before age 91   Heart disease Other    Diabetes Other    Hypertension Other    Alcohol abuse Other    Mental illness Other     Social History   Socioeconomic History   Marital status: Married    Spouse name: Not on file   Number of children: Not on file   Years of education: Not on file   Highest education level: Not on file  Occupational History   Not on file  Tobacco Use   Smoking status: Former    Packs/day: 1.00    Years: 30.00    Total pack years: 30.00    Types: Cigarettes    Quit date: 06/23/2003    Years since quitting: 19.2   Smokeless tobacco: Never  Vaping Use   Vaping Use: Never used  Substance and Sexual Activity   Alcohol use: Not Currently   Drug use: No   Sexual activity: Yes    Birth control/protection: None  Other Topics Concern   Not on file  Social History Narrative   Not on file   Social Determinants of Health   Financial Resource Strain: Not on file  Food Insecurity: Not on file  Transportation Needs: Not on file  Physical Activity: Not on file  Stress: Not on file  Social Connections: Not on file  Intimate Partner Violence: Not on file    Past  Medical History, Surgical history, Social history, and Family history were reviewed and updated as appropriate.   Please see review of systems for further details on the patient's review from today.   Objective:   Physical Exam:  Wt 140 lb  (63.5 kg)   BMI 24.03 kg/m   Physical Exam Neurological:     Mental Status: She is alert and oriented to person, place, and time.     Cranial Nerves: No dysarthria.  Psychiatric:        Attention and Perception: Attention and perception normal.        Mood and Affect: Mood normal.        Speech: Speech normal.        Behavior: Behavior is cooperative.        Thought Content: Thought content normal. Thought content is not paranoid or delusional. Thought content does not include homicidal or suicidal ideation. Thought content does not include homicidal or suicidal plan.        Cognition and Memory: Cognition and memory normal.        Judgment: Judgment normal.     Comments: Insight intact     Lab Review:     Component Value Date/Time   NA 135 06/10/2022 1100   K 4.1 06/10/2022 1100   CL 108 06/10/2022 1100   CO2 20 (L) 06/10/2022 1100   GLUCOSE 110 (H) 06/10/2022 1100   BUN 31 (H) 06/10/2022 1100   CREATININE 0.75 06/10/2022 1100   CALCIUM 9.5 06/10/2022 1100   PROT 6.7 03/21/2021 0051   ALBUMIN 3.2 (L) 03/21/2021 0051   AST 18 03/21/2021 0051   ALT 25 03/21/2021 0051   ALKPHOS 61 03/21/2021 0051   BILITOT 0.7 03/21/2021 0051   GFRNONAA >60 06/10/2022 1100   GFRAA 60 (L) 04/22/2019 1227       Component Value Date/Time   WBC 7.5 06/10/2022 1100   RBC 4.08 06/10/2022 1100   HGB 12.4 06/10/2022 1100   HCT 37.9 06/10/2022 1100   PLT 397 06/10/2022 1100   MCV 92.9 06/10/2022 1100   MCH 30.4 06/10/2022 1100   MCHC 32.7 06/10/2022 1100   RDW 13.9 06/10/2022 1100   LYMPHSABS 2.9 03/21/2021 0051   MONOABS 0.4 03/21/2021 0051   EOSABS 0.1 03/21/2021 0051   BASOSABS 0.1 03/21/2021 0051    No results found for: "POCLITH", "LITHIUM"   No results found for: "PHENYTOIN", "PHENOBARB", "VALPROATE", "CBMZ"   .res Assessment: Plan:    Will continue current plan of care since target signs and symptoms are well controlled without any tolerability issues. Continue Sertrsline  100 mg po BID for anxiety and depression.  Continue Alprazolam prn anxiety. Pt has refills on file and will contact office if she needs additional refill before next visit.  Pt to follow-up in 6 months or sooner if clinically indicated.  Patient advised to contact office with any questions, adverse effects, or acute worsening in signs and symptoms.   Alicia Crawford was seen today for follow-up.  Diagnoses and all orders for this visit:  Panic disorder -     sertraline (ZOLOFT) 100 MG tablet; Take 1 tablet (100 mg total) by mouth in the morning and at bedtime.  Recurrent major depressive disorder, in full remission ([place]) -     sertraline (ZOLOFT) 100 MG tablet; Take 1 tablet (100 mg total) by mouth in the morning and at bedtime.  Insomnia, unspecified type    Please see After Visit Summary for patient specific instructions.  Future Appointments  Date Time Provider [place]  10/17/2022 12:30 PM Hayden Pedro, MD [facility] None    No orders of the defined types were placed in this encounter.     -------------------------------

## 2022-09-22 DIAGNOSIS — Z87891 Personal history of nicotine dependence: Secondary | ICD-10-CM | POA: Diagnosis not present

## 2022-09-22 DIAGNOSIS — E1151 Type 2 diabetes mellitus with diabetic peripheral angiopathy without gangrene: Secondary | ICD-10-CM | POA: Diagnosis not present

## 2022-09-22 DIAGNOSIS — Z6824 Body mass index (BMI) 24.0-24.9, adult: Secondary | ICD-10-CM | POA: Diagnosis not present

## 2022-09-22 DIAGNOSIS — I10 Essential (primary) hypertension: Secondary | ICD-10-CM | POA: Diagnosis not present

## 2022-10-17 ENCOUNTER — Encounter (INDEPENDENT_AMBULATORY_CARE_PROVIDER_SITE_OTHER): Payer: PPO | Admitting: Ophthalmology

## 2022-11-13 ENCOUNTER — Emergency Department (HOSPITAL_COMMUNITY)
Admission: EM | Admit: 2022-11-13 | Discharge: 2022-11-13 | Disposition: A | Discharge status: Home / Self Care | Payer: PPO | Attending: Emergency Medicine | Admitting: Emergency Medicine

## 2022-11-13 ENCOUNTER — Emergency Department (HOSPITAL_COMMUNITY): Payer: PPO

## 2022-11-13 ENCOUNTER — Other Ambulatory Visit: Payer: Self-pay

## 2022-11-13 ENCOUNTER — Encounter (HOSPITAL_COMMUNITY): Payer: Self-pay | Admitting: Emergency Medicine

## 2022-11-13 DIAGNOSIS — W1830XA Fall on same level, unspecified, initial encounter: Secondary | ICD-10-CM | POA: Insufficient documentation

## 2022-11-13 DIAGNOSIS — Z7984 Long term (current) use of oral hypoglycemic drugs: Secondary | ICD-10-CM | POA: Insufficient documentation

## 2022-11-13 DIAGNOSIS — E119 Type 2 diabetes mellitus without complications: Secondary | ICD-10-CM | POA: Diagnosis not present

## 2022-11-13 DIAGNOSIS — S76212A Strain of adductor muscle, fascia and tendon of left thigh, initial encounter: Secondary | ICD-10-CM | POA: Diagnosis not present

## 2022-11-13 DIAGNOSIS — M25552 Pain in left hip: Secondary | ICD-10-CM | POA: Diagnosis not present

## 2022-11-13 DIAGNOSIS — S76811A Strain of other specified muscles, fascia and tendons at thigh level, right thigh, initial encounter: Secondary | ICD-10-CM | POA: Insufficient documentation

## 2022-11-13 DIAGNOSIS — S8992XA Unspecified injury of left lower leg, initial encounter: Secondary | ICD-10-CM | POA: Diagnosis present

## 2022-11-13 MED ORDER — LIDOCAINE 4 % EX CREA
TOPICAL_CREAM | Freq: Once | CUTANEOUS | Status: DC
  Filled 2022-11-13: qty 5

## 2022-11-13 MED ORDER — LIDOCAINE 4 % EX CREA: 1.0000 | TOPICAL_CREAM | CUTANEOUS | 0 refills | Status: DC | PRN

## 2022-11-13 MED ORDER — HYDROCODONE-ACETAMINOPHEN 5-325 MG PO TABS
1.0000 | ORAL_TABLET | Freq: Once | ORAL | Status: AC
  Administered 2022-11-13: 1 via ORAL
  Filled 2022-11-13: qty 1

## 2022-11-13 NOTE — ED Triage Notes (Signed)
Pt here from home with c/o left leg /hip pain after slipping in the med , pt states that it hurts to walk but is able to with a walker

## 2022-11-13 NOTE — Discharge Instructions (Signed)
Please follow-up with your primary care doctor, you likely have a groin strain, make sure you are stretching gently, and not making any rapid movements.  If you fall again, have any other trauma, your left leg turns blue or or pale white, please return to the ER.

## 2022-11-13 NOTE — ED Notes (Signed)
Pt verbalizes understanding of discharge instructions. Opportunity for questions and answers were provided. Pt discharged from the ED.   ?

## 2022-11-13 NOTE — ED Provider Notes (Signed)
[facility] Provider Note   CSN: [id number] Arrival date & time: 11/13/22  1828     History  Chief Complaint  Patient presents with   Leg Injury    Alicia Crawford is a 67 y.o. female, history of diabetes, who presents to the ED secondary to a fall where Alicia Crawford fell on her left hip/buttocks today, and has had difficulty walking since there.  Alicia Crawford states that Alicia Crawford has pain in her left groin, and that it hurts when Alicia Crawford walks, has been walking with a walker, and has been able to bear weight, however it is tender.  Has taken Tylenol without relief.  Denies any loss of bowel, bladder.  No back pain, neck pain or trauma to head trauma.  No other complaints.    Home Medications Prior to Admission medications   Medication Sig Start Date End Date Taking? Authorizing Provider  lidocaine (LMX) 4 % cream Apply 1 Application topically as needed. 11/13/22  Yes Archimedes Harold L, PA  acetaminophen (TYLENOL) 500 MG tablet Take 1 tablet (500 mg total) by mouth every 6 (six) hours as needed (pain). Patient not taking: Reported on 09/07/2022 05/25/22   Jaquita Folds, MD  ALPRAZolam Duanne Moron) 0.5 MG tablet Take 1/2-1 tablet twice daily as needed for severe anxiety 06/13/22   Thayer Headings, PMHNP  amLODipine (NORVASC) 5 MG tablet TAKE 1 TABLET(5 MG) BY MOUTH DAILY 06/10/22   Lorretta Harp, MD  Ascorbic Acid (VITAMIN C) 100 MG tablet Take 100 mg by mouth daily.    [provider]  atorvastatin (LIPITOR) 80 MG tablet TAKE 1 TABLET BY MOUTH DAILY 01/10/22   Lorretta Harp, MD  cholecalciferol (VITAMIN D) 25 MCG (1000 UNIT) tablet Take 1,000 Units by mouth daily.    [provider]  cilostazol (PLETAL) 100 MG tablet TAKE 1 TABLET BY MOUTH TWICE DAILY BEFORE MEALS 08/03/22   Lorretta Harp, MD  Coenzyme Q10 300 MG CAPS Take 300 mg by mouth daily.    [provider]  estradiol (ESTRACE) 0.1 MG/GM vaginal cream Place 0.5 g vaginally 2 (two)  times a week. Patient not taking: Reported on 09/07/2022 03/22/21   Arrien, Jimmy Picket, MD  Evolocumab (REPATHA SURECLICK) [id number] MG/ML SOAJ Inject 140 mg into the skin every 14 (fourteen) days. Patient taking differently: Inject 140 mg into the skin every 14 (fourteen) days. Every other Sunday 09/14/21   Lorretta Harp, MD  ibuprofen (ADVIL) 600 MG tablet Take 1 tablet (600 mg total) by mouth every 6 (six) hours as needed. 05/25/22   Jaquita Folds, MD  magnesium oxide (MAG-OX) 400 MG tablet Take 400 mg by mouth 2 (two) times daily.    [provider]  melatonin 5 MG TABS Take 5 mg by mouth at bedtime as needed (sleep).    [provider]  metFORMIN (GLUCOPHAGE) 1000 MG tablet Take 1,000 mg by mouth daily. 01/05/15   [provider]  metoprolol tartrate (LOPRESSOR) 25 MG tablet TAKE 1 TABLET(25 MG) BY MOUTH TWICE DAILY Patient taking differently: Take 25 mg by mouth 2 (two) times daily. 10/14/21   Lorretta Harp, MD  ondansetron (ZOFRAN) 4 MG tablet Take 1 tablet (4 mg total) by mouth every 8 (eight) hours as needed for nausea, vomiting or refractory nausea / vomiting. Patient not taking: Reported on 09/07/2022 12/31/21 12/31/22  Netta Cedars, MD  pantoprazole (PROTONIX) 40 MG tablet TAKE 1 TABLET(40 MG) BY MOUTH DAILY Patient not  taking: Reported on 09/07/2022 08/27/21   Collene Gobble, MD  polyethylene glycol powder (GLYCOLAX/MIRALAX) 17 GM/SCOOP powder Take 17 g by mouth daily. Drink 17g (1 scoop) dissolved in water per day. Patient not taking: Reported on 09/07/2022 05/25/22   Jaquita Folds, MD  PREBIOTIC PRODUCT PO Take by mouth.    [provider]  Probiotic Product (PROBIOTIC BLEND PO) Take by mouth.    [provider]  senna-docusate (SENOKOT-S) 8.6-50 MG tablet Take 1 tablet by mouth at bedtime as needed for mild constipation or moderate constipation. Patient not taking: Reported on 09/07/2022 12/29/21   Margette Fast, MD   sertraline (ZOLOFT) 100 MG tablet Take 1 tablet (100 mg total) by mouth in the morning and at bedtime. 09/07/22 12/06/22  Thayer Headings, PMHNP  Trospium Chloride 60 MG CP24 Take 1 capsule (60 mg total) by mouth daily. Patient not taking: Reported on 09/07/2022 02/23/22   Jaquita Folds, MD  zinc gluconate 50 MG tablet Take 50 mg by mouth daily.    [provider]      Allergies    Halothane    Review of Systems   Review of Systems  Genitourinary:  Positive for pelvic pain.  Musculoskeletal:  Negative for back pain and neck pain.    Physical Exam Updated Vital Signs BP (!) 185/99 (BP Location: Right Arm)   Pulse 99   Temp 98.7 F (37.1 C)   Resp 17   SpO2 95%  Physical Exam Vitals and nursing note reviewed.  Constitutional:      General: Alicia Crawford is not in acute distress.    Appearance: Alicia Crawford is well-developed.  HENT:     Head: Normocephalic and atraumatic.  Eyes:     Conjunctiva/sclera: Conjunctivae normal.  Cardiovascular:     Rate and Rhythm: Normal rate and regular rhythm.     Pulses:          Femoral pulses are 2+ on the right side and 2+ on the left side.      Dorsalis pedis pulses are 2+ on the right side and 2+ on the left side.     Heart sounds: No murmur heard. Pulmonary:     Effort: Pulmonary effort is normal. No respiratory distress.     Breath sounds: Normal breath sounds.  Abdominal:     Palpations: Abdomen is soft.     Tenderness: There is no abdominal tenderness.  Musculoskeletal:        General: No swelling.     Cervical back: Neck supple.     Comments: No lateral or posterior tenderness to palpation of left hip, there is tenderness to palpation along inguinal canal however.  Worse with internal and extension of leg.  Range of motion intact however.  Able to weight-bear.  Skin:    General: Skin is warm and dry.     Capillary Refill: Capillary refill takes less than 2 seconds.  Neurological:     Mental Status: Alicia Crawford is alert.  Psychiatric:         Mood and Affect: Mood normal.     ED Results / Procedures / Treatments   Labs (all labs ordered are listed, but only abnormal results are displayed) Labs Reviewed - No data to display  EKG None  Radiology DG Hip Unilat W or Wo Pelvis 2-3 Views Left  Result Date: 11/13/2022 CLINICAL DATA:  Fall, left hip pain EXAM: DG HIP (WITH OR WITHOUT PELVIS) 2-3V LEFT COMPARISON:  None Available. FINDINGS: Normal alignment.  No acute fracture or dislocation. Sacroiliac and hip joint spaces are preserved. Vascular calcifications are seen within the pelvis bilaterally. IMPRESSION: 1. No acute fracture or dislocation. Electronically Signed   By: Fidela Salisbury M.D.   On: 11/13/2022 19:35    Procedures Procedures    Medications Ordered in ED Medications  lidocaine (LMX) 4 % cream (has no administration in time range)  HYDROcodone-acetaminophen (NORCO/VICODIN) 5-325 MG per tablet 1 tablet (has no administration in time range)    ED Course/ Medical Decision Making/ A&P                             Medical Decision Making Patient is a 67 year old female, here for a fall that occurred earlier today, that caused her to land on her buttocks, and now Alicia Crawford has some left groin pain, Alicia Crawford has tenderness to palpation however Alicia Crawford has femoral pulses, and dorsalis pedis pulses.  Alicia Crawford has pain with range of motion, however Alicia Crawford is still able to weight-bear, we will give her lidocaine cream to help with the pain there, and obtain x-ray for further evaluation to rule out a fracture.  Alicia Crawford is not any blood thinners and Alicia Crawford had no head trauma, denies any other pain.  Amount and/or Complexity of Data Reviewed Radiology: ordered.    Details: X-ray unremarkable Discussion of management or test interpretation with external provider(s): Discussed with patient, Alicia Crawford has walker at home, we will prescribe lidocaine cream for pain control, x-ray unremarkable, likely groin strain, follow-up with PCP.  Risk OTC  drugs. Prescription drug management.    Final Clinical Impression(s) / ED Diagnoses Final diagnoses:  Inguinal strain, left, initial encounter    Rx / DC Orders ED Discharge Orders          Ordered    lidocaine (LMX) 4 % cream  As needed        11/13/22 1942              Jacody Beneke, Si Gaul, PA 11/13/22 1945    Lennice Sites, DO 11/13/22 2025

## 2022-11-14 DIAGNOSIS — F329 Major depressive disorder, single episode, unspecified: Secondary | ICD-10-CM | POA: Diagnosis not present

## 2022-11-14 DIAGNOSIS — I2510 Atherosclerotic heart disease of native coronary artery without angina pectoris: Secondary | ICD-10-CM | POA: Diagnosis not present

## 2022-11-14 DIAGNOSIS — E1169 Type 2 diabetes mellitus with other specified complication: Secondary | ICD-10-CM | POA: Diagnosis not present

## 2022-11-14 DIAGNOSIS — I10 Essential (primary) hypertension: Secondary | ICD-10-CM | POA: Diagnosis not present

## 2022-11-14 DIAGNOSIS — E785 Hyperlipidemia, unspecified: Secondary | ICD-10-CM | POA: Diagnosis not present

## 2022-11-21 DIAGNOSIS — I10 Essential (primary) hypertension: Secondary | ICD-10-CM | POA: Diagnosis not present

## 2022-11-21 DIAGNOSIS — E1122 Type 2 diabetes mellitus with diabetic chronic kidney disease: Secondary | ICD-10-CM | POA: Diagnosis not present

## 2022-11-21 DIAGNOSIS — D649 Anemia, unspecified: Secondary | ICD-10-CM | POA: Diagnosis not present

## 2022-11-21 DIAGNOSIS — R634 Abnormal weight loss: Secondary | ICD-10-CM | POA: Diagnosis not present

## 2022-11-21 DIAGNOSIS — E1169 Type 2 diabetes mellitus with other specified complication: Secondary | ICD-10-CM | POA: Diagnosis not present

## 2022-11-21 DIAGNOSIS — E1151 Type 2 diabetes mellitus with diabetic peripheral angiopathy without gangrene: Secondary | ICD-10-CM | POA: Diagnosis not present

## 2022-11-21 DIAGNOSIS — M79659 Pain in unspecified thigh: Secondary | ICD-10-CM | POA: Diagnosis not present

## 2022-11-23 DIAGNOSIS — M25552 Pain in left hip: Secondary | ICD-10-CM | POA: Diagnosis not present

## 2022-11-30 ENCOUNTER — Encounter (INDEPENDENT_AMBULATORY_CARE_PROVIDER_SITE_OTHER): Payer: PPO | Admitting: Ophthalmology

## 2022-12-21 DIAGNOSIS — M25552 Pain in left hip: Secondary | ICD-10-CM | POA: Diagnosis not present

## 2022-12-29 ENCOUNTER — Ambulatory Visit (INDEPENDENT_AMBULATORY_CARE_PROVIDER_SITE_OTHER): Payer: PPO | Admitting: Psychiatry

## 2022-12-29 ENCOUNTER — Encounter: Payer: Self-pay | Admitting: Psychiatry

## 2022-12-29 ENCOUNTER — Encounter (INDEPENDENT_AMBULATORY_CARE_PROVIDER_SITE_OTHER): Payer: PPO | Admitting: Ophthalmology

## 2022-12-29 DIAGNOSIS — F3342 Major depressive disorder, recurrent, in full remission: Secondary | ICD-10-CM

## 2022-12-29 DIAGNOSIS — G4700 Insomnia, unspecified: Secondary | ICD-10-CM

## 2022-12-29 DIAGNOSIS — F410 Panic disorder [episodic paroxysmal anxiety] without agoraphobia: Secondary | ICD-10-CM | POA: Diagnosis not present

## 2022-12-29 MED ORDER — TRAZODONE HCL 100 MG PO TABS: ORAL_TABLET | ORAL | 1 refills | Status: DC

## 2022-12-29 MED ORDER — SERTRALINE HCL 100 MG PO TABS: 100.0000 mg | ORAL_TABLET | Freq: Two times a day (BID) | ORAL | 2 refills | Status: DC

## 2022-12-29 MED ORDER — ALPRAZOLAM 0.5 MG PO TABS: ORAL_TABLET | ORAL | 3 refills | Status: DC

## 2022-12-29 NOTE — Progress Notes (Signed)
Alicia Crawford [id number] 1956/04/27 67 y.o.  Virtual Visit via Telephone Note  I connected with pt on 12/29/22 at  3:30 PM EDT by telephone and verified that I am speaking with Alicia correct person using two identifiers.   I discussed Alicia limitations, risks, security and privacy concerns of performing an evaluation and management service by telephone and Alicia availability of in person appointments. I also discussed with Alicia patient that there may be a patient responsible charge related to this service. Alicia patient expressed understanding and agreed to proceed.   I discussed Alicia assessment and treatment plan with Alicia patient. Alicia patient was provided an opportunity to ask questions and all were answered. Alicia patient agreed with Alicia plan and demonstrated an understanding of Alicia instructions.   Alicia patient was advised to call back or seek an in-person evaluation if Alicia symptoms worsen or if Alicia condition fails to improve as anticipated.  I provided 17 minutes of non-face-to-face time during this encounter.  Alicia patient was located at home.  Alicia provider was located at Alicia Crawford.   Alicia Crawford, PMHNP   Subjective:   Patient ID:  Alicia Crawford is a 67 y.o. (DOB Dec 20, 1955) female.  Chief Complaint:  Chief Complaint  Patient presents with   Insomnia   Follow-up    Anxiety    HPI Alicia Crawford presents for follow-up of anxiety, depression, and insomnia. She reports that she fell and sustained an injury in February. She later learned that she had broken her pelvic bone. She has been taking Tylenol and ibuprofen for pain. She has been taking alprazolam at night to help with sleep. She reports sleeping about 6 hours a night and awakens around 4-5 am and is unable to return to sleep. She reports that she had some panic and increased initially and denies any panic attacks now. Denies excessive worry. She reports that she had some depression initially and this has improved. She reports  improved energy and motivation. Appetite is improving. Concentration is adequate. Denies SI.   She reports that she stayed with her sister with a little over a week.   Sister is now cancer free.   Past Psychiatric Medication Trials: Xanax- Helpful Ativan Sertraline- started in 2013 Prozac- took for years and then stopped working Topamax Trazodone  Review of Systems:  Review of Systems  Gastrointestinal: Negative.   Musculoskeletal:  Negative for gait problem.       Pain related to pelvic fracture  Neurological:  Negative for headaches.  Psychiatric/Behavioral:         Please refer to HPI    Medications: I have reviewed Alicia patient's current medications.  Current Outpatient Medications  Medication Sig Dispense Refill   acetaminophen (TYLENOL) 500 MG tablet Take 1 tablet (500 mg total) by mouth every 6 (six) hours as needed (pain). 30 tablet 0   amLODipine (NORVASC) 5 MG tablet TAKE 1 TABLET(5 MG) BY MOUTH DAILY 90 tablet 3   Ascorbic Acid (VITAMIN C) 100 MG tablet Take 100 mg by mouth daily.     cholecalciferol (VITAMIN D) 25 MCG (1000 UNIT) tablet Take 1,000 Units by mouth daily.     cilostazol (PLETAL) 100 MG tablet TAKE 1 TABLET BY MOUTH TWICE DAILY BEFORE MEALS 180 tablet 3   Coenzyme Q10 300 MG CAPS Take 300 mg by mouth daily.     Evolocumab (REPATHA SURECLICK) [id number] MG/ML SOAJ Inject 140 mg into Alicia skin every 14 (fourteen) days. (Patient taking differently: Inject 140 mg  into Alicia skin every 14 (fourteen) days. Every other Sunday) 2 mL 11   ibuprofen (ADVIL) 600 MG tablet Take 1 tablet (600 mg total) by mouth every 6 (six) hours as needed. 30 tablet 0   magnesium oxide (MAG-OX) 400 MG tablet Take 400 mg by mouth 2 (two) times daily.     melatonin 5 MG TABS Take 5 mg by mouth at bedtime as needed (sleep).     metFORMIN (GLUCOPHAGE) 1000 MG tablet Take 1,000 mg by mouth daily.  0   metoprolol tartrate (LOPRESSOR) 25 MG tablet TAKE 1 TABLET(25 MG) BY MOUTH TWICE DAILY (Patient  taking differently: Take 25 mg by mouth 2 (two) times daily.) 180 tablet 3   PREBIOTIC PRODUCT PO Take by mouth.     Probiotic Product (PROBIOTIC BLEND PO) Take by mouth.     zinc gluconate 50 MG tablet Take 50 mg by mouth daily.     ALPRAZolam (XANAX) 0.5 MG tablet Take 1/2-1 tablet twice daily as needed for severe anxiety 60 tablet 3   atorvastatin (LIPITOR) 80 MG tablet TAKE 1 TABLET BY MOUTH DAILY (Patient not taking: Reported on 12/29/2022) 90 tablet 3   estradiol (ESTRACE) 0.1 MG/GM vaginal cream Place 0.5 g vaginally 2 (two) times a week. (Patient not taking: Reported on 09/07/2022)     lidocaine (LMX) 4 % cream Apply 1 Application topically as needed. 30 g 0   sertraline (ZOLOFT) 100 MG tablet Take 1 tablet (100 mg total) by mouth in Alicia morning and at bedtime. 180 tablet 2   traZODone (DESYREL) 100 MG tablet Take 1/2-1 tablet po QHS prn insomnia 90 tablet 1   No current facility-administered medications for this visit.    Medication Side Effects: None  Allergies:  Allergies  Allergen Reactions   Halothane Hives and Other (See Comments)    Gas used 1980's Reaction: affected her liver    Past Medical History:  Diagnosis Date   Ankle fracture 09/05/2016   right ankle   Anoxic brain injury ([place]) 08/2010   "w/cardiac arrest"   Anxiety 10/2010   panic disorder   Bilateral hypertensive retinopathy 2020   Blood transfusion 09/2010   Cardiac arrest ([place]) 08/04/2010   witnessed cardiac arrest, with CPR & defibrillation, and ultimately coronary artery bypass grafting on 09/27/2010. She had a prolonged hospitalization on a ventilalator and rehab.   Carotid artery occlusion    hx of moderate bilateral ICA stenosis by duplex ultrasound 06/02/2021   Chronic kidney disease    stage 3   Claudication Alicia Crawford)    life-style limiting claudication   Complication of anesthesia    [place], given in Alicia 1980's, affected liver   Coronary artery disease    CABG 2011 in [place], Pt follows  with Dr. Quay Crawford, cardiologist, Alicia Crawford 05/17/22 in [place].   Depression    GERD (gastroesophageal reflux disease)    Hepatitis    1980's due to being given Halothane during anesthesia per pt   High cholesterol    Hyperkalemia 03/2021   03/18/21 K+ 6.5   Hypertension    Follows with cardiologist, Alicia Burow, MD., [place] 05/17/22 in [place].   Myocardial infarction [facility]) 09/27/2010   in [place]   Obesity    Peripheral vascular disease ([place])    bilat SFA diseae   Renal artery stenosis (HCC)    35% right renal artery stenosis, revascularization was recommended, however patient fell and developed a subdural hematoma, workup was put on hold, medical therapy was recommended  per 05/17/22 cardiology OV note in Epic from Dr. Quay Crawford.   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  S/P evacuation of hematoma   Type II diabetes mellitus (HCC)    takes Metformin   UTI (urinary tract infection) 03/2021   hospitalized for acute kidney injury, was found to have a Klebsiella UTI   Wears glasses    reading only    Family History  Problem Relation Age of Onset   Heart disease Mother        before age 60   Diabetes Mother    Hyperlipidemia Mother    Hypertension Mother    Heart attack Mother    Heart disease Father        Before age 15   Hyperlipidemia Father    Hypertension Father    Heart attack Father    Heart disease Sister        Before age 80   Heart attack Sister    Cancer - Colon Sister    Hyperlipidemia Brother    Hypertension Brother    Heart attack Brother    Heart disease Brother        before age 54   Heart disease Other    Diabetes Other    Hypertension Other    Alcohol abuse Other    Mental illness Other     Social History   Socioeconomic History   Marital status: Married    Spouse name: Not on file   Number of children: Not  on file   Years of education: Not on file   Highest education level: Not on file  Occupational History   Not on file  Tobacco Use   Smoking status: Former    Packs/day: 1.00    Years: 30.00    Additional pack years: 0.00    Total pack years: 30.00    Types: Cigarettes    Quit date: 06/23/2003    Years since quitting: 19.5   Smokeless tobacco: Never  Vaping Use   Vaping Use: Never used  Substance and Sexual Activity   Alcohol use: Not Currently   Drug use: No   Sexual activity: Yes    Birth control/protection: None  Other Topics Concern   Not on file  Social History Narrative   Not on file   Social Determinants of Health   Financial Resource Strain: Not on file  Food Insecurity: Not on file  Transportation Needs: Not on file  Physical Activity: Not on file  Stress: Not on file  Social Connections: Not on file  Intimate Partner Violence: Not on file    Past Medical History, Surgical history, Social history, and Family history were reviewed and updated as appropriate.   Please see review of systems for further details on Alicia patient's review from today.   Objective:   Physical Exam:  There were no vitals taken for this visit.  Physical Exam Neurological:     Mental Status: She is alert and oriented to person, place, and time.     Cranial Nerves: No dysarthria.  Psychiatric:        Attention and Perception: Attention and perception normal.        Mood and Affect: Mood normal.        Speech: Speech normal.  Behavior: Behavior is cooperative.        Thought Content: Thought content normal. Thought content is not paranoid or delusional. Thought content does not include homicidal or suicidal ideation. Thought content does not include homicidal or suicidal plan.        Cognition and Memory: Cognition and memory normal.        Judgment: Judgment normal.     Comments: Insight intact     Lab Review:     Component Value Date/Time   NA 135 06/10/2022 1100   K  4.1 06/10/2022 1100   CL 108 06/10/2022 1100   CO2 20 (L) 06/10/2022 1100   GLUCOSE 110 (H) 06/10/2022 1100   BUN 31 (H) 06/10/2022 1100   CREATININE 0.75 06/10/2022 1100   CALCIUM 9.5 06/10/2022 1100   PROT 6.7 03/21/2021 0051   ALBUMIN 3.2 (L) 03/21/2021 0051   AST 18 03/21/2021 0051   ALT 25 03/21/2021 0051   ALKPHOS 61 03/21/2021 0051   BILITOT 0.7 03/21/2021 0051   GFRNONAA >60 06/10/2022 1100   GFRAA 60 (L) 04/22/2019 1227       Component Value Date/Time   WBC 7.5 06/10/2022 1100   RBC 4.08 06/10/2022 1100   HGB 12.4 06/10/2022 1100   HCT 37.9 06/10/2022 1100   PLT 397 06/10/2022 1100   MCV 92.9 06/10/2022 1100   MCH 30.4 06/10/2022 1100   MCHC 32.7 06/10/2022 1100   RDW 13.9 06/10/2022 1100   LYMPHSABS 2.9 03/21/2021 0051   MONOABS 0.4 03/21/2021 0051   EOSABS 0.1 03/21/2021 0051   BASOSABS 0.1 03/21/2021 0051    No results found for: "POCLITH", "LITHIUM"   No results found for: "PHENYTOIN", "PHENOBARB", "VALPROATE", "CBMZ"   .res Assessment: Plan:    Discussed option to re-start Trazodone to improve insomnia that she has been experiencing since hip fracture. Discussed that Alprazolam typically has a duration of about 4 hours and this is likely why it is helping her fall asleep and not stay asleep. Discussed that Trazodone may be more helpful for prevent early morning awakenings. Pt agrees to re-start of Trazodone. Will re-start Trazodone 100 mg 1/2-1 tab po QHS prn insomnia.  Continue Sertraline 100 mg po BID for depression and anxiety.  Continue Alprazolam 0.5 mg 1/2-1 tab twice daily as needed for anxiety.  Pt to follow-up in 6 months or sooner if clinically indicated.  Patient advised to contact office with any questions, adverse effects, or acute worsening in signs and symptoms.   Indica was seen today for insomnia and follow-up.  Diagnoses and all orders for this visit:  Panic disorder -     ALPRAZolam (XANAX) 0.5 MG tablet; Take 1/2-1 tablet twice  daily as needed for severe anxiety -     sertraline (ZOLOFT) 100 MG tablet; Take 1 tablet (100 mg total) by mouth in Alicia morning and at bedtime.  Insomnia, unspecified type -     traZODone (DESYREL) 100 MG tablet; Take 1/2-1 tablet po QHS prn insomnia  Recurrent major depressive disorder, in full remission (HCC) -     sertraline (ZOLOFT) 100 MG tablet; Take 1 tablet (100 mg total) by mouth in Alicia morning and at bedtime.    Please see After Visit Summary for patient specific instructions.  Future Appointments  Date Time Provider [place]  01/17/2023  1:10 PM Hayden Pedro, MD [facility] None    No orders of Alicia defined types were placed in this encounter.     -------------------------------

## 2023-01-17 ENCOUNTER — Encounter (INDEPENDENT_AMBULATORY_CARE_PROVIDER_SITE_OTHER): Payer: PPO | Admitting: Ophthalmology

## 2023-01-17 DIAGNOSIS — I10 Essential (primary) hypertension: Secondary | ICD-10-CM

## 2023-01-17 DIAGNOSIS — E113393 Type 2 diabetes mellitus with moderate nonproliferative diabetic retinopathy without macular edema, bilateral: Secondary | ICD-10-CM

## 2023-01-17 DIAGNOSIS — H35033 Hypertensive retinopathy, bilateral: Secondary | ICD-10-CM

## 2023-01-17 DIAGNOSIS — H43813 Vitreous degeneration, bilateral: Secondary | ICD-10-CM | POA: Diagnosis not present

## 2023-02-07 ENCOUNTER — Other Ambulatory Visit: Payer: Self-pay | Admitting: Cardiovascular Disease

## 2023-05-01 ENCOUNTER — Other Ambulatory Visit: Payer: Self-pay | Admitting: Cardiovascular Disease

## 2023-05-02 ENCOUNTER — Other Ambulatory Visit: Payer: Self-pay | Admitting: Cardiovascular Disease

## 2023-05-17 DIAGNOSIS — Z7984 Long term (current) use of oral hypoglycemic drugs: Secondary | ICD-10-CM | POA: Diagnosis not present

## 2023-05-17 DIAGNOSIS — E119 Type 2 diabetes mellitus without complications: Secondary | ICD-10-CM | POA: Diagnosis not present

## 2023-05-17 DIAGNOSIS — I10 Essential (primary) hypertension: Secondary | ICD-10-CM | POA: Diagnosis not present

## 2023-05-17 DIAGNOSIS — Z87891 Personal history of nicotine dependence: Secondary | ICD-10-CM | POA: Diagnosis not present

## 2023-05-17 DIAGNOSIS — Z6826 Body mass index (BMI) 26.0-26.9, adult: Secondary | ICD-10-CM | POA: Diagnosis not present

## 2023-06-15 ENCOUNTER — Encounter (HOSPITAL_COMMUNITY): Payer: PPO

## 2023-06-16 ENCOUNTER — Encounter (HOSPITAL_COMMUNITY): Payer: PPO

## 2023-06-23 ENCOUNTER — Ambulatory Visit (HOSPITAL_COMMUNITY)
Admission: RE | Admit: 2023-06-23 | Discharge: 2023-06-23 | Disposition: A | Discharge status: Home / Self Care | Payer: PPO | Source: Ambulatory Visit | Attending: Cardiovascular Disease | Admitting: Cardiovascular Disease

## 2023-06-23 DIAGNOSIS — I6522 Occlusion and stenosis of left carotid artery: Secondary | ICD-10-CM

## 2023-06-26 ENCOUNTER — Other Ambulatory Visit: Payer: Self-pay | Admitting: *Deleted

## 2023-06-26 DIAGNOSIS — I6523 Occlusion and stenosis of bilateral carotid arteries: Secondary | ICD-10-CM

## 2023-06-29 DIAGNOSIS — Z9181 History of falling: Secondary | ICD-10-CM | POA: Diagnosis not present

## 2023-06-29 DIAGNOSIS — E11319 Type 2 diabetes mellitus with unspecified diabetic retinopathy without macular edema: Secondary | ICD-10-CM | POA: Diagnosis not present

## 2023-06-29 DIAGNOSIS — E1121 Type 2 diabetes mellitus with diabetic nephropathy: Secondary | ICD-10-CM | POA: Diagnosis not present

## 2023-06-29 DIAGNOSIS — I700 Atherosclerosis of aorta: Secondary | ICD-10-CM | POA: Diagnosis not present

## 2023-06-29 DIAGNOSIS — E1169 Type 2 diabetes mellitus with other specified complication: Secondary | ICD-10-CM | POA: Diagnosis not present

## 2023-06-29 DIAGNOSIS — E1151 Type 2 diabetes mellitus with diabetic peripheral angiopathy without gangrene: Secondary | ICD-10-CM | POA: Diagnosis not present

## 2023-06-29 DIAGNOSIS — I10 Essential (primary) hypertension: Secondary | ICD-10-CM | POA: Diagnosis not present

## 2023-06-29 DIAGNOSIS — F329 Major depressive disorder, single episode, unspecified: Secondary | ICD-10-CM | POA: Diagnosis not present

## 2023-06-29 DIAGNOSIS — Z23 Encounter for immunization: Secondary | ICD-10-CM | POA: Diagnosis not present

## 2023-06-29 DIAGNOSIS — Z0000 Encounter for general adult medical examination without abnormal findings: Secondary | ICD-10-CM | POA: Diagnosis not present

## 2023-06-29 DIAGNOSIS — E785 Hyperlipidemia, unspecified: Secondary | ICD-10-CM | POA: Diagnosis not present

## 2023-06-29 DIAGNOSIS — I739 Peripheral vascular disease, unspecified: Secondary | ICD-10-CM | POA: Diagnosis not present

## 2023-07-24 ENCOUNTER — Encounter (INDEPENDENT_AMBULATORY_CARE_PROVIDER_SITE_OTHER): Payer: PPO | Admitting: Ophthalmology

## 2023-07-24 DIAGNOSIS — H35033 Hypertensive retinopathy, bilateral: Secondary | ICD-10-CM

## 2023-07-24 DIAGNOSIS — H2513 Age-related nuclear cataract, bilateral: Secondary | ICD-10-CM | POA: Diagnosis not present

## 2023-07-24 DIAGNOSIS — Z7984 Long term (current) use of oral hypoglycemic drugs: Secondary | ICD-10-CM

## 2023-07-24 DIAGNOSIS — H43813 Vitreous degeneration, bilateral: Secondary | ICD-10-CM | POA: Diagnosis not present

## 2023-07-24 DIAGNOSIS — E113393 Type 2 diabetes mellitus with moderate nonproliferative diabetic retinopathy without macular edema, bilateral: Secondary | ICD-10-CM

## 2023-07-24 DIAGNOSIS — H33301 Unspecified retinal break, right eye: Secondary | ICD-10-CM

## 2023-07-24 DIAGNOSIS — I10 Essential (primary) hypertension: Secondary | ICD-10-CM

## 2023-07-28 ENCOUNTER — Telehealth: Payer: Self-pay | Admitting: Psychiatry

## 2023-07-28 ENCOUNTER — Other Ambulatory Visit: Payer: Self-pay

## 2023-07-28 DIAGNOSIS — F410 Panic disorder [episodic paroxysmal anxiety] without agoraphobia: Secondary | ICD-10-CM

## 2023-07-28 MED ORDER — ALPRAZOLAM 0.5 MG PO TABS
ORAL_TABLET | ORAL | 3 refills | Status: DC
Start: 2023-07-28 — End: 2023-08-10

## 2023-07-28 NOTE — Telephone Encounter (Signed)
Pt called  and made an appt for 11/7. She needs a refill on her xanax. Pharmacy is [facility] on [address]

## 2023-07-28 NOTE — Telephone Encounter (Signed)
Pended.

## 2023-08-10 ENCOUNTER — Telehealth (INDEPENDENT_AMBULATORY_CARE_PROVIDER_SITE_OTHER): Payer: PPO | Admitting: Psychiatry

## 2023-08-10 ENCOUNTER — Encounter: Payer: Self-pay | Admitting: Psychiatry

## 2023-08-10 DIAGNOSIS — F3342 Major depressive disorder, recurrent, in full remission: Secondary | ICD-10-CM | POA: Diagnosis not present

## 2023-08-10 DIAGNOSIS — G4700 Insomnia, unspecified: Secondary | ICD-10-CM | POA: Diagnosis not present

## 2023-08-10 DIAGNOSIS — F410 Panic disorder [episodic paroxysmal anxiety] without agoraphobia: Secondary | ICD-10-CM

## 2023-08-10 MED ORDER — ALPRAZOLAM 0.5 MG PO TABS
ORAL_TABLET | ORAL | 5 refills | Status: DC
Start: 2023-08-25 — End: 2024-05-13

## 2023-08-10 MED ORDER — SERTRALINE HCL 100 MG PO TABS
100.0000 mg | ORAL_TABLET | Freq: Two times a day (BID) | ORAL | 3 refills | Status: DC
Start: 2023-08-10 — End: 2024-05-13

## 2023-08-10 MED ORDER — TRAZODONE HCL 100 MG PO TABS
ORAL_TABLET | ORAL | 3 refills | Status: DC
Start: 2023-08-10 — End: 2024-05-20

## 2023-08-10 NOTE — Progress Notes (Signed)
Alicia Crawford [id number] 1956-07-21 67 y.o.  Virtual Visit via Video Note  I connected with pt @ on 08/10/23 at  9:30 AM EST by a video enabled telemedicine application and verified that I am speaking with the correct person using two identifiers.   I discussed the limitations of evaluation and management by telemedicine and the availability of in person appointments. The patient expressed understanding and agreed to proceed.  I discussed the assessment and treatment plan with the patient. The patient was provided an opportunity to ask questions and all were answered. The patient agreed with the plan and demonstrated an understanding of the instructions.   The patient was advised to call back or seek an in-person evaluation if the symptoms worsen or if the condition fails to improve as anticipated.  I provided 23 minutes of non-face-to-face time during this encounter.  The patient was located at home.  The provider was located at [facility].   Corie Chiquito, PMHNP   Subjective:   Patient ID:  Alicia Crawford is a 67 y.o. (DOB December 10, 1955) female.  Chief Complaint:  Chief Complaint  Patient presents with   Follow-up    Anxiety, depression, and insomnia    HPI Alicia Crawford presents for follow-up of anxiety, depression, and insomnia. "I'm doing good." Taking Alprazolam 1/2 tablet at bedtime and 1/2 tab of Trazodone and is sleeping all night. She denies any recent anxious. Denies depressed mood. She reports that her energy and motivation is "not as good as I would like for it to be." She took a swim class for 1.5 months and this ended. She is now taking a chair yoga class. Appetite has been good. She reports concentration is "ok." She is trying to study her Bible and work puzzles. Denies anhedonia. Enjoys weekly date night with her husband. She reports that her 3 dogs keep her busy. Denies SI.   She reports some difficulty with memory and that her husband tells her they have  discussed things and she does not recall this. Denies worsening memory.   Denies any acute stressors.   Alprazolam last filled 07/28/23.   Past Psychiatric Medication Trials: Xanax- Helpful Ativan Sertraline- started in 2013 Prozac- took for years and then stopped working Topamax Trazodone  Review of Systems:  Review of Systems  Gastrointestinal: Negative.   Musculoskeletal:  Negative for gait problem.       Shoulder pain  Neurological:  Negative for tremors.  Psychiatric/Behavioral:         Please refer to HPI    Medications: I have reviewed the patient's current medications.  Current Outpatient Medications  Medication Sig Dispense Refill   amLODipine (NORVASC) 5 MG tablet TAKE 1 TABLET(5 MG) BY MOUTH DAILY 90 tablet 3   Ascorbic Acid (VITAMIN C) 100 MG tablet Take 100 mg by mouth daily.     Bacillus Coagulans-Inulin (PROBIOTIC-PREBIOTIC PO) Take by mouth.     cholecalciferol (VITAMIN D) 25 MCG (1000 UNIT) tablet Take 1,000 Units by mouth daily.     cilostazol (PLETAL) 100 MG tablet PATIENT MUST SCHEDULE ANNUAL APPOINTMENT FOR FUTURE REFILLS TAKE 1 TABLET BY MOUTH TWICE DAILY BEFORE MEALS 180 tablet 0   Coenzyme Q10 300 MG CAPS Take 300 mg by mouth daily.     estradiol (ESTRACE) 0.1 MG/GM vaginal cream Place 0.5 g vaginally 2 (two) times a week.     magnesium oxide (MAG-OX) 400 MG tablet Take 400 mg by mouth 2 (two) times daily.     metFORMIN (  GLUCOPHAGE) 1000 MG tablet Take 1,000 mg by mouth daily.  0   metoprolol tartrate (LOPRESSOR) 25 MG tablet TAKE 1 TABLET(25 MG) BY MOUTH TWICE DAILY (Patient taking differently: Take 25 mg by mouth 2 (two) times daily.) 180 tablet 3   REPATHA SURECLICK 140 MG/ML SOAJ ADMINISTER 1 ML UNDER THE SKIN EVERY 14 DAYS 2 mL 11   zinc gluconate 50 MG tablet Take 50 mg by mouth daily.     [START ON 08/25/2023] ALPRAZolam (XANAX) 0.5 MG tablet Take 1/2-1 tablet twice daily as needed for severe anxiety 60 tablet 5   lidocaine (LMX) 4 % cream Apply  1 Application topically as needed. 30 g 0   sertraline (ZOLOFT) 100 MG tablet Take 1 tablet (100 mg total) by mouth in the morning and at bedtime. 180 tablet 3   traZODone (DESYREL) 100 MG tablet Take 1/2-1 tablet po QHS prn insomnia 90 tablet 3   No current facility-administered medications for this visit.    Medication Side Effects: None  Allergies:  Allergies  Allergen Reactions   Halothane Hives and Other (See Comments)    Gas used 1980's Reaction: affected her liver    Past Medical History:  Diagnosis Date   Ankle fracture 09/05/2016   right ankle   Anoxic brain injury (HCC) 08/2010   "w/cardiac arrest"   Anxiety 10/2010   panic disorder   Bilateral hypertensive retinopathy 2020   Blood transfusion 09/2010   Cardiac arrest (HCC) 08/04/2010   witnessed cardiac arrest, with CPR & defibrillation, and ultimately coronary artery bypass grafting on 09/27/2010. She had a prolonged hospitalization on a ventilalator and rehab.   Carotid artery occlusion    hx of moderate bilateral ICA stenosis by duplex ultrasound 06/02/2021   Chronic kidney disease    stage 3   Claudication [facility])    life-style limiting claudication   Complication of anesthesia    [place]an, given in the 1980's, affected liver   Coronary artery disease    CABG 2011 in [place], Pt follows with Dr. Nanetta Batty, cardiologist, LOV 05/17/22 in Epic.   Depression    GERD (gastroesophageal reflux disease)    Hepatitis    1980's due to being given Halothane during anesthesia per pt   High cholesterol    Hyperkalemia 03/2021   03/18/21 K+ 6.5   Hypertension    Follows with cardiologist, Nanetta Batty, MD., LOV 05/17/22 in Epic.   Myocardial infarction [facility]) 09/27/2010   in [place]   Obesity    Peripheral vascular disease (HCC)    bilat SFA diseae   Renal artery stenosis (HCC)    35% right renal artery stenosis, revascularization was recommended, however patient fell and developed a subdural  hematoma, workup was put on hold, medical therapy was recommended per 05/17/22 cardiology OV note in Epic from Dr. Nanetta Batty.   Rotator cuff tendonitis    left   Sleep apnea    uses CPAP nightly   Stroke [facility]) 2011   no residual deficits   Subdural hematoma (HCC) 02/25/2012   Frontal subdural  S/P evacuation of hematoma   Type II diabetes mellitus (HCC)    takes Metformin   UTI (urinary tract infection) 03/2021   hospitalized for acute kidney injury, was found to have a Klebsiella UTI   Wears glasses    reading only    Family History  Problem Relation Age of Onset   Heart disease Mother        before age 34  Diabetes Mother    Hyperlipidemia Mother    Hypertension Mother    Heart attack Mother    Heart disease Father        Before age 35   Hyperlipidemia Father    Hypertension Father    Heart attack Father    Heart disease Sister        Before age 62   Heart attack Sister    Cancer - Colon Sister    Hyperlipidemia Brother    Hypertension Brother    Heart attack Brother    Heart disease Brother        before age 37   Heart disease Other    Diabetes Other    Hypertension Other    Alcohol abuse Other    Mental illness Other     Social History   Socioeconomic History   Marital status: Married    Spouse name: Not on file   Number of children: Not on file   Years of education: Not on file   Highest education level: Not on file  Occupational History   Not on file  Tobacco Use   Smoking status: Former    Current packs/day: 0.00    Average packs/day: 1 pack/day for 30.0 years (30.0 ttl pk-yrs)    Types: Cigarettes    Start date: 06/22/1973    Quit date: 06/23/2003    Years since quitting: 20.1   Smokeless tobacco: Never  Vaping Use   Vaping status: Never Used  Substance and Sexual Activity   Alcohol use: Not Currently   Drug use: No   Sexual activity: Yes    Birth control/protection: None  Other Topics Concern   Not on file  Social History Narrative    Not on file   Social Determinants of Health   Financial Resource Strain: Not on file  Food Insecurity: Not on file  Transportation Needs: Not on file  Physical Activity: Not on file  Stress: Not on file  Social Connections: Not on file  Intimate Partner Violence: Not on file    Past Medical History, Surgical history, Social history, and Family history were reviewed and updated as appropriate.   Please see review of systems for further details on the patient's review from today.   Objective:   Physical Exam:  There were no vitals taken for this visit.  Physical Exam Neurological:     Mental Status: She is alert and oriented to person, place, and time.     Cranial Nerves: No dysarthria.  Psychiatric:        Attention and Perception: Attention and perception normal.        Mood and Affect: Mood normal.        Speech: Speech normal.        Behavior: Behavior is cooperative.        Thought Content: Thought content normal. Thought content is not paranoid or delusional. Thought content does not include homicidal or suicidal ideation. Thought content does not include homicidal or suicidal plan.        Cognition and Memory: Cognition and memory normal.        Judgment: Judgment normal.     Comments: Insight intact     Lab Review:     Component Value Date/Time   NA 135 06/10/2022 1100   K 4.1 06/10/2022 1100   CL 108 06/10/2022 1100   CO2 20 (L) 06/10/2022 1100   GLUCOSE 110 (H) 06/10/2022 1100   BUN 31 (H) 06/10/2022 1100  CREATININE 0.75 06/10/2022 1100   CALCIUM 9.5 06/10/2022 1100   PROT 6.7 03/21/2021 0051   ALBUMIN 3.2 (L) 03/21/2021 0051   AST 18 03/21/2021 0051   ALT 25 03/21/2021 0051   ALKPHOS 61 03/21/2021 0051   BILITOT 0.7 03/21/2021 0051   GFRNONAA >60 06/10/2022 1100   GFRAA 60 (L) 04/22/2019 1227       Component Value Date/Time   WBC 7.5 06/10/2022 1100   RBC 4.08 06/10/2022 1100   HGB 12.4 06/10/2022 1100   HCT 37.9 06/10/2022 1100   PLT 397  06/10/2022 1100   MCV 92.9 06/10/2022 1100   MCH 30.4 06/10/2022 1100   MCHC 32.7 06/10/2022 1100   RDW 13.9 06/10/2022 1100   LYMPHSABS 2.9 03/21/2021 0051   MONOABS 0.4 03/21/2021 0051   EOSABS 0.1 03/21/2021 0051   BASOSABS 0.1 03/21/2021 0051    No results found for: "POCLITH", "LITHIUM"   No results found for: "PHENYTOIN", "PHENOBARB", "VALPROATE", "CBMZ"   .res Assessment: Plan:    29 minutes spent dedicated to the care of this patient on the date of this encounter to include pre-visit review of records, ordering of medication, post visit documentation, and face-to-face time with the patient discussing ongoing treatment plan. Pt reports that her mood, anxiety, and insomnia has been well controlled and she would like to continue current medications without changes at this time. Continue Sertraline 100 mg twice daily for depression and anxiety.  Continue Trazodone 100 mg 1/2-1 tablet at bedtime as needed for insomnia.  Continue Alprazolam 0.5 mg 1/2-1 tablet twice daily as needed for severe anxiety.  Pt to follow-up in 6 months or sooner if clinically indicated.  Patient advised to contact office with any questions, adverse effects, or acute worsening in signs and symptoms.   Delonda was seen today for follow-up.  Diagnoses and all orders for this visit:  Recurrent major depressive disorder, in full remission (HCC) -     sertraline (ZOLOFT) 100 MG tablet; Take 1 tablet (100 mg total) by mouth in the morning and at bedtime.  Insomnia, unspecified type -     traZODone (DESYREL) 100 MG tablet; Take 1/2-1 tablet po QHS prn insomnia  Panic disorder -     sertraline (ZOLOFT) 100 MG tablet; Take 1 tablet (100 mg total) by mouth in the morning and at bedtime. -     ALPRAZolam (XANAX) 0.5 MG tablet; Take 1/2-1 tablet twice daily as needed for severe anxiety     Please see After Visit Summary for patient specific instructions.  Future Appointments  Date Time Provider Department  Center  01/22/2024 12:55 PM Sherrie George, MD [facility] None    No orders of the defined types were placed in this encounter.     -------------------------------

## 2023-08-16 ENCOUNTER — Encounter: Payer: Self-pay | Admitting: Psychiatry

## 2023-11-08 ENCOUNTER — Telehealth: Payer: Self-pay | Admitting: Cardiovascular Disease

## 2023-11-08 ENCOUNTER — Other Ambulatory Visit (HOSPITAL_COMMUNITY): Payer: Self-pay

## 2023-11-08 ENCOUNTER — Telehealth: Payer: Self-pay | Admitting: Pharmacy Technician

## 2023-11-08 NOTE — Telephone Encounter (Signed)
 Pharmacy Patient Advocate Encounter   Received notification from Pt Calls Messages that prior authorization for Repatha  SureClick 140MG /ML auto-injectors is required/requested.   Insurance verification completed.   The patient is insured through [facility] ADV[employer]/RX ADVANCE .   Per test claim: PA required; PA submitted to above mentioned insurance via CoverMyMeds Key/confirmation #/EOC [facility] Status is pending

## 2023-11-08 NOTE — Telephone Encounter (Signed)
 Calling to check to see if we received PA for Repatha  and if pt still needs

## 2023-11-08 NOTE — Telephone Encounter (Signed)
 PA request has been Submitted. New Encounter created for follow up. For additional info see Pharmacy Prior Auth telephone encounter from 11/08/23.

## 2023-11-08 NOTE — Telephone Encounter (Signed)
 Pharmacy Patient Advocate Encounter  Received notification from [employer] that Prior Authorization for repatha  has been DENIED.  See denial reason below. No denial letter attached in CMM. Will attach denial letter to Media tab once received.    All I could find was patient was taking atorvastatin  until stopped on 08/10/23 with a note saying patient not taking, no other reason I could find. Patient had labs on 03/02/2021 that said: History of hyperlipidemia on high-dose statin therapy with lipid profile performed 03/02/2021 revealing total cholesterol 118, LDL 50 and HDL 50. But then on 06/29/23 found in ekpn ldl was 147 and patient no longer on a statin.    PA #/Case ID/Reference #: [id number]

## 2023-11-09 MED ORDER — ATORVASTATIN CALCIUM 20 MG PO TABS: 20.0000 mg | ORAL_TABLET | Freq: Every day | ORAL | 3 refills | Status: DC

## 2023-11-09 NOTE — Telephone Encounter (Signed)
 Attempted to call pt to discuss to see what she is and is not taking in order to do an appeals VM full  Alicia Crawford called back. She was previously taking atorvastatin  80 but it made her feel terrible, therefore she stopped it. Feels much better off of it. She is taking Repatha  q 2 weeks. She took her last one last week. We did discuss that her LDL-C is above goal (previously at goal on atorvastatin  80mg  and repatha ). Discussed retrial of atorvastatin  at a lower dose vs different statin. Patient willing to retry atorvastatin  at 20mg  daily. Will work on copy for Repatha .  Appeals faxed to [employer]

## 2023-11-09 NOTE — Addendum Note (Signed)
 Addended by: Lillyen Schow D on: 11/09/2023 11:23 AM   Modules accepted: Orders

## 2023-11-10 NOTE — Telephone Encounter (Signed)
 Appeals won. Repatha  approved for 1 year.   Called pt. Mailbox is full

## 2023-11-14 NOTE — Telephone Encounter (Signed)
Spoke with pt regarding her repatha. She states that she is going to get it shortly to restart. Pt has no questions at this time.

## 2023-11-15 ENCOUNTER — Other Ambulatory Visit: Payer: Self-pay | Admitting: Cardiovascular Disease

## 2023-11-15 MED ORDER — CILOSTAZOL 100 MG PO TABS: ORAL_TABLET | ORAL | 1 refills | Status: DC

## 2023-11-15 NOTE — Addendum Note (Signed)
Addended by: Adriana Simas, Hannah Strader L on: 11/15/2023 03:51 PM   Modules accepted: Orders

## 2023-12-15 ENCOUNTER — Ambulatory Visit: Payer: PPO | Admitting: Cardiovascular Disease

## 2024-01-02 ENCOUNTER — Ambulatory Visit: Admitting: Cardiovascular Disease

## 2024-01-22 ENCOUNTER — Encounter (INDEPENDENT_AMBULATORY_CARE_PROVIDER_SITE_OTHER): Payer: PPO | Admitting: Ophthalmology

## 2024-01-30 ENCOUNTER — Ambulatory Visit: Attending: Cardiovascular Disease | Admitting: Cardiovascular Disease

## 2024-01-30 ENCOUNTER — Encounter: Payer: Self-pay | Admitting: Cardiovascular Disease

## 2024-01-30 VITALS — BP 144/82 | HR 95 | Ht 64.0 in | Wt 178.0 lb

## 2024-01-30 DIAGNOSIS — I10 Essential (primary) hypertension: Secondary | ICD-10-CM | POA: Diagnosis not present

## 2024-01-30 DIAGNOSIS — E782 Mixed hyperlipidemia: Secondary | ICD-10-CM

## 2024-01-30 DIAGNOSIS — I701 Atherosclerosis of renal artery: Secondary | ICD-10-CM | POA: Diagnosis not present

## 2024-01-30 DIAGNOSIS — I2510 Atherosclerotic heart disease of native coronary artery without angina pectoris: Secondary | ICD-10-CM

## 2024-01-30 DIAGNOSIS — I739 Peripheral vascular disease, unspecified: Secondary | ICD-10-CM

## 2024-01-30 MED ORDER — CILOSTAZOL 100 MG PO TABS: ORAL_TABLET | ORAL | 3 refills | Status: AC

## 2024-01-30 NOTE — Assessment & Plan Note (Signed)
 History of cardiac arrest in [facility] 09/27/2010 status post bypass grafting there.  She has remained stable from a cardiac point of view since.  She denies chest pain or shortness of breath.

## 2024-01-30 NOTE — Patient Instructions (Addendum)
 Medication Instructions:  Your physician recommends that you continue on your current medications as directed. Please refer to the Current Medication list given to you today.  *If you need a refill on your cardiac medications before your next appointment, please call your pharmacy*   Testing/Procedures: Your physician has requested that you have a carotid duplex. This test is an ultrasound of the carotid arteries in your neck. It looks at blood flow through these arteries that supply the brain with blood. Allow one hour for this exam. There are no restrictions or special instructions.  **To do in September**  Please note: We ask at that you not bring children with you during ultrasound (echo/ vascular) testing. Due to room size and safety concerns, children are not allowed in the ultrasound rooms during exams. Our front office staff cannot provide observation of children in our lobby area while testing is being conducted. An adult accompanying a patient to their appointment will only be allowed in the ultrasound room at the discretion of the ultrasound technician under special circumstances. We apologize for any inconvenience.   Follow-Up: At [facility], you and your health needs are our priority.  As part of our continuing mission to provide you with exceptional heart care, our providers are all part of one team.  This team includes your primary Cardiologist (physician) and Advanced Practice Providers or APPs (Physician Assistants and Nurse Practitioners) who all work together to provide you with the care you need, when you need it.  Your next appointment:   12 month(s)  Provider:   Lauro Portal, MD    We recommend signing up for the patient portal called "MyChart".  Sign up information is provided on this After Visit Summary.  MyChart is used to connect with patients for Virtual Visits (Telemedicine).  Patients are able to view lab/test results, encounter notes, upcoming  appointments, etc.  Non-urgent messages can be sent to your provider as well.   To learn more about what you can do with MyChart, go to [email or web address].

## 2024-01-30 NOTE — Progress Notes (Signed)
 I am sorry 6 no idea     01/30/2024 Alicia Crawford   December 01, 1955  [id number]  Primary Physician Ronna Coho, MD Primary Cardiologist: Avanell Leigh MD FACP, [place], [place], [place]  HPI:  Alicia Crawford is a 68 y.o.  moderately overweight, married Caucasian female, mother of 3 who I last saw in the office 05/17/2022.  She has a history of remote tobacco abuse having quit 10 years ago, hypertension, hyperlipidemia and a family history of heart disease with both mother and father who had CAD and a brother who had a stent. She had an episode of witnessed cardiac arrest, CPR, defibrillation and ultimately coronary artery bypass grafting in [facility] September 27, 2010. She had prolonged hospitalization on a ventilator and rehab. Echo performed April 2012 showed normal LV systolic function. A Myoview was normal as well. She otherwise is asymptomatic except for lifestyle-limiting claudication. Dopplers performed in our office December 06, 2011, revealed ABIs of 0.46 on the right with an occluded SFA and high-grade distal right common, profunda stenosis, left ABI of 0.62 with an occluded left SFA. I angiogram'd her January 17, 2012, demonstrating occluded SFAs bilaterally with 2-vessel runoff on the right, 1 on the left via peroneal. She did have a 95% calcified ostial profunda femoris stenosis on the right jeopardizing collaterals to her infrapopliteal vessels as well as a 60% distal left common femoral artery stenosis. She also had an incidentally noted 35% right renal artery stenosis. I reviewed the films with Dr. Charlotte Cookey who felt surgical revascularization was optimal. She in the interim developed a subdural hematoma after falling and hitting her head and her workup was put on hold.  She later saw Dr. Charlotte Cookey back in the office who recommended continued medical therapy.  She does have severe bladder prolapse and has been hospitalized recently for UTI and metabolic abnormalities.  She wishes to have prolapse surgery.  She  continues to deny chest pain, shortness of breath or claudication.     Since I saw her in the office 2 years ago she continues to do well.  She has gained 30 pounds over the last winter.  She walks her dog 1 to 1-1/2 miles a day without symptoms.  She denies chest pain or shortness of breath.     Current Meds  Medication Sig   Ascorbic Acid  (VITAMIN C ) 100 MG tablet Take 100 mg by mouth daily.   Bacillus Coagulans-Inulin (PROBIOTIC-PREBIOTIC PO) Take by mouth.   cholecalciferol  (VITAMIN D ) 25 MCG (1000 UNIT) tablet Take 1,000 Units by mouth daily.   Coenzyme Q10 300 MG CAPS Take 300 mg by mouth daily.   estradiol  (ESTRACE ) 0.1 MG/GM vaginal cream Place 0.5 g vaginally 2 (two) times a week.   magnesium  oxide (MAG-OX) 400 MG tablet Take 400 mg by mouth 2 (two) times daily.   REPATHA  SURECLICK 140 MG/ML SOAJ ADMINISTER 1 ML UNDER THE SKIN EVERY 14 DAYS   zinc  gluconate 50 MG tablet Take 50 mg by mouth daily.   [DISCONTINUED] cilostazol  (PLETAL ) 100 MG tablet TAKE ONE TABLET BY MOUTH TWICE DAILY BEFORE A MEAL.     Allergies  Allergen Reactions   Halothane Hives and Other (See Comments)    Gas used 1980's Reaction: affected her liver    Social History   Socioeconomic History   Marital status: Married    Spouse name: Not on file   Number of children: Not on file   Years of education: Not on file   Highest education  level: Not on file  Occupational History   Not on file  Tobacco Use   Smoking status: Former    Current packs/day: 0.00    Average packs/day: 1 pack/day for 30.0 years (30.0 ttl pk-yrs)    Types: Cigarettes    Start date: 06/22/1973    Quit date: 06/23/2003    Years since quitting: 20.6   Smokeless tobacco: Never  Vaping Use   Vaping status: Never Used  Substance and Sexual Activity   Alcohol use: Not Currently   Drug use: No   Sexual activity: Yes    Birth control/protection: None  Other Topics Concern   Not on file  Social History Narrative   Not on file    Social Drivers of Health   Financial Resource Strain: Not on file  Food Insecurity: Not on file  Transportation Needs: Not on file  Physical Activity: Not on file  Stress: Not on file  Social Connections: Not on file  Intimate Partner Violence: Not on file     Review of Systems: General: negative for chills, fever, night sweats or weight changes.  Cardiovascular: negative for chest pain, dyspnea on exertion, edema, orthopnea, palpitations, paroxysmal nocturnal dyspnea or shortness of breath Dermatological: negative for rash Respiratory: negative for cough or wheezing Urologic: negative for hematuria Abdominal: negative for nausea, vomiting, diarrhea, bright red blood per rectum, melena, or hematemesis Neurologic: negative for visual changes, syncope, or dizziness All other systems reviewed and are otherwise negative except as noted above.    Blood pressure (!) 144/82, pulse 95, height 5\' 4"  (1.626 m), weight 178 lb (80.7 kg), SpO2 97%.  General appearance: alert and no distress Neck: no adenopathy, no carotid bruit, no JVD, supple, symmetrical, trachea midline, and thyroid  not enlarged, symmetric, no tenderness/mass/nodules Lungs: clear to auscultation bilaterally Heart: regular rate and rhythm, S1, S2 normal, no murmur, click, rub or gallop Extremities: extremities normal, atraumatic, no cyanosis or edema Pulses: Absent pedal pulses Skin: Skin color, texture, turgor normal. No rashes or lesions Neurologic: Grossly normal  EKG EKG Interpretation Date/Time:  Tuesday January 30 2024 15:47:19 EDT Ventricular Rate:  95 PR Interval:  114 QRS Duration:  84 QT Interval:  372 QTC Calculation: 467 R Axis:   25  Text Interpretation: Sinus rhythm with Premature atrial complexes with Abberant conduction Nonspecific ST abnormality When compared with ECG of 20-Mar-2021 14:16, Fusion complexes are no longer Present Abberant conduction is now Present Confirmed by Lauro Portal [id number])  on 01/30/2024 4:12:16 PM    ASSESSMENT AND PLAN:   Peripheral artery disease, bilta LE PVD by angio April 2013 History of PAD status post peripheral vascular angiogram which I performed 01/17/12 revealing occluded SFAs bilaterally.  I did not think she had percutaneous options and referred her to Dr. Charlotte Cookey who felt that she required continued medical therapy.  She has been asymptomatic since.  HLD (hyperlipidemia) History of hyperlipidemia on Repatha  followed by her PCP.  CAD, cardiac arrest, CPR, CABG in [facility] Dec 2011-NL LVF by Echo, neg Nuc April 2013 History of cardiac arrest in [place] 09/27/2010 status post bypass grafting there.  She has remained stable from a cardiac point of view since.  She denies chest pain or shortness of breath.  Occlusion and stenosis of carotid artery without mention of cerebral infarction History of moderate left ICA stenosis by duplex ultrasound 06/23/2023.  This will be repeated on an annual basis.  Essential hypertension History of essential hypertension with blood pressure measured today 144/82.  She  is on amlodipine  and metoprolol .     Avanell Leigh MD [facility], [facility] 01/30/2024 4:26 PM

## 2024-01-30 NOTE — Assessment & Plan Note (Signed)
 History of essential hypertension with blood pressure measured today 144/82.  She is on amlodipine  and metoprolol .

## 2024-01-30 NOTE — Assessment & Plan Note (Signed)
 History of moderate left ICA stenosis by duplex ultrasound 06/23/2023.  This will be repeated on an annual basis.

## 2024-01-30 NOTE — Assessment & Plan Note (Signed)
History of hyperlipidemia on Repatha  followed by her PCP 

## 2024-01-30 NOTE — Assessment & Plan Note (Signed)
 History of PAD status post peripheral vascular angiogram which I performed 01/17/12 revealing occluded SFAs bilaterally.  I did not think she had percutaneous options and referred her to Dr. Charlotte Cookey who felt that she required continued medical therapy.  She has been asymptomatic since.

## 2024-01-31 DIAGNOSIS — I10 Essential (primary) hypertension: Secondary | ICD-10-CM | POA: Diagnosis not present

## 2024-01-31 DIAGNOSIS — E11319 Type 2 diabetes mellitus with unspecified diabetic retinopathy without macular edema: Secondary | ICD-10-CM | POA: Diagnosis not present

## 2024-01-31 DIAGNOSIS — E1169 Type 2 diabetes mellitus with other specified complication: Secondary | ICD-10-CM | POA: Diagnosis not present

## 2024-01-31 DIAGNOSIS — E785 Hyperlipidemia, unspecified: Secondary | ICD-10-CM | POA: Diagnosis not present

## 2024-02-07 ENCOUNTER — Other Ambulatory Visit: Payer: Self-pay

## 2024-02-07 ENCOUNTER — Ambulatory Visit (HOSPITAL_COMMUNITY)
Admission: EM | Admit: 2024-02-07 | Discharge: 2024-02-07 | Disposition: A | Discharge status: Home / Self Care | Attending: Family Medicine | Admitting: Family Medicine

## 2024-02-07 ENCOUNTER — Encounter (HOSPITAL_COMMUNITY): Payer: Self-pay | Admitting: *Deleted

## 2024-02-07 DIAGNOSIS — R1032 Left lower quadrant pain: Secondary | ICD-10-CM | POA: Diagnosis not present

## 2024-02-07 DIAGNOSIS — I10 Essential (primary) hypertension: Secondary | ICD-10-CM | POA: Diagnosis not present

## 2024-02-07 DIAGNOSIS — R1031 Right lower quadrant pain: Secondary | ICD-10-CM

## 2024-02-07 LAB — POCT URINALYSIS DIP (MANUAL ENTRY)
Glucose, UA: NEGATIVE mg/dL
Nitrite, UA: NEGATIVE
Protein Ur, POC: >=300 mg/dL — AB
Spec Grav, UA: >=1.03 — AB (ref 1.010–1.025)
Urobilinogen, UA: 0.2 U/dL
pH, UA: 5.5 (ref 5.0–8.0)

## 2024-02-07 MED ORDER — CEPHALEXIN 500 MG PO CAPS: 500.0000 mg | ORAL_CAPSULE | Freq: Two times a day (BID) | ORAL | 0 refills | Status: AC

## 2024-02-07 NOTE — ED Triage Notes (Signed)
 PT DOB and Full Name confirmed

## 2024-02-07 NOTE — ED Triage Notes (Signed)
 PT reports she feels like all the muscles in her stomach are pulled. Pt reports pain worse when coughing.

## 2024-02-07 NOTE — ED Provider Notes (Signed)
 [facility]    ASSESSMENT & PLAN:  1. Bilateral lower abdominal discomfort   2. Elevated blood pressure reading with diagnosis of hypertension    Results for orders placed or performed during the hospital encounter of 02/07/24  POC urinalysis dipstick   Collection Time: 02/07/24  1:38 PM  Result Value Ref Range   Color, UA yellow yellow   Clarity, UA clear clear   Glucose, UA negative negative mg/dL   Bilirubin, UA small (A) negative   Ketones, POC UA trace (5) (A) negative mg/dL   Spec Grav, UA >=2.956 (A) 1.010 - 1.025   Blood, UA small (A) negative   pH, UA 5.5 5.0 - 8.0   Protein Ur, POC >=300 (A) negative mg/dL   Urobilinogen, UA 0.2 0.2 or 1.0 E.U./dL   Nitrite, UA Negative Negative   Leukocytes, UA Trace (A) Negative   Begin: Meds ordered this encounter  Medications   cephALEXin  (KEFLEX ) 500 MG capsule    Sig: Take 1 capsule (500 mg total) by mouth 2 (two) times daily.    Dispense:  10 capsule    Refill:  0   No signs of pyelonephritis. Urine culture sent. Will notify patient of any significant results. Ensure proper hydration. Declines blood work today.   Follow-up Information     Schedule an appointment as soon as possible for a visit  with Ronna Coho, MD.   Specialty: Family Medicine Why: For follow up and to recheck your blood pressure. Contact information: [address] [place] [address] [phone]                   Discharge Instructions      You have had labs (urine culture) sent today. We will call you with any significant abnormalities or if there is need to begin or change treatment or pursue further follow up.  You may also review your test results online through MyChart. If you do not have a MyChart account, instructions to sign up should be on your discharge paperwork.  Your blood pressure was noted to be elevated during your visit today. If you are currently taking medication for high  blood pressure, please ensure you are taking this as directed. If you do not have a history of high blood pressure and your blood pressure remains persistently elevated, you may need to begin taking a medication at some point. You may return here within the next few days to recheck if unable to see your primary care provider or if you do not have a one.  BP (!) 181/103   Pulse 70   Temp 98 F (36.7 C)   Resp 20   SpO2 93%   BP Readings from Last 3 Encounters:  02/07/24 (!) 181/103  01/30/24 (!) 144/82  11/13/22 (!) 185/99        Outlined signs and symptoms indicating need for more acute intervention. Patient verbalized understanding. After Visit Summary given.  SUBJECTIVE:  Alicia Crawford is a 68 y.o. female who reports lower abdominal discomfort; first noted approx 5-6 d ago; stable; "just kinda feels sore". Does not affect sleep. Ques slight urinary urgency. Questions possibility of UTI. Normal PO intake without n/v/d. Denies fever. Ambulatory without difficulty. No tx PTA. LMP: No LMP recorded. Patient has had a hysterectomy.  Increased blood pressure noted today. Reports that she is treated for HTN. Taking meds as directed. No symptoms.  OBJECTIVE:  Vitals:   02/07/24 1301  BP: (!) 181/103  Pulse: 70  Resp: 20  Temp: 98 F (36.7 C)  SpO2: 93%   General appearance: alert; no distress HENT: oropharynx: moist CV: reg Lungs: unlabored respirations Abdomen: soft, non-tender; bowel sounds normal; no masses or organomegaly; no guarding or rebound tenderness Back: no CVA tenderness Extremities: no edema; symmetrical with no gross deformities Skin: warm and dry Neurologic: normal gait Psychological: alert and cooperative; normal mood and affect  Labs Reviewed  POCT URINALYSIS DIP (MANUAL ENTRY) - Abnormal; Notable for the following components:      Result Value   Bilirubin, UA small (*)    Ketones, POC UA trace (5) (*)    Spec Grav, UA >=1.030 (*)    Blood, UA  small (*)    Protein Ur, POC >=300 (*)    Leukocytes, UA Trace (*)    All other components within normal limits  URINE CULTURE    Allergies  Allergen Reactions   Halothane Hives and Other (See Comments)    Gas used 1980's Reaction: affected her liver    Past Medical History:  Diagnosis Date   Ankle fracture 09/05/2016   right ankle   Anoxic brain injury (HCC) 08/2010   "w/cardiac arrest"   Anxiety 10/2010   panic disorder   Bilateral hypertensive retinopathy 2020   Blood transfusion 09/2010   Cardiac arrest (HCC) 08/04/2010   witnessed cardiac arrest, with CPR & defibrillation, and ultimately coronary artery bypass grafting on 09/27/2010. She had a prolonged hospitalization on a ventilalator and rehab.   Carotid artery occlusion    hx of moderate bilateral ICA stenosis by duplex ultrasound 06/02/2021   Chronic kidney disease    stage 3   Claudication [facility])    life-style limiting claudication   Complication of anesthesia    Hal[place]an, given in the 1980's, affected liver   Coronary artery disease    CABG 2011 in [place], Pt follows with Dr. Lauro Portal, cardiologist, LOV 05/17/22 in Epic.   Depression    GERD (gastroesophageal reflux disease)    Hepatitis    1980's due to being given Halothane during anesthesia per pt   High cholesterol    Hyperkalemia 03/2021   03/18/21 K+ 6.5   Hypertension    Follows with cardiologist, Lauro Portal, MD., LOV 05/17/22 in Epic.   Myocardial infarction [facility]) 09/27/2010   in [email or web address]    Obesity    Peripheral vascular disease (HCC)    bilat SFA diseae   Renal artery stenosis (HCC)    35% right renal artery stenosis, revascularization was recommended, however patient fell and developed a subdural hematoma, workup was put on hold, medical therapy was recommended per 05/17/22 cardiology OV note in Epic from Dr. Lauro Portal.   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  S/P evacuation of hematoma   Type II diabetes mellitus (HCC)    takes Metformin    UTI (urinary tract infection) 03/2021   hospitalized for acute kidney injury, was found to have a Klebsiella UTI   Wears glasses    reading only   Social History   Socioeconomic History   Marital status: Married    Spouse name: Not on file   Number of children: Not on file   Years of education: Not on file   Highest education level: Not on file  Occupational History  Not on file  Tobacco Use   Smoking status: Former    Current packs/day: 0.00    Average packs/day: 1 pack/day for 30.0 years (30.0 ttl pk-yrs)    Types: Cigarettes    Start date: 06/22/1973    Quit date: 06/23/2003    Years since quitting: 20.6   Smokeless tobacco: Never  Vaping Use   Vaping status: Never Used  Substance and Sexual Activity   Alcohol use: Not Currently   Drug use: No   Sexual activity: Yes    Birth control/protection: None  Other Topics Concern   Not on file  Social History Narrative   Not on file   Social Drivers of Health   Financial Resource Strain: Not on file  Food Insecurity: Not on file  Transportation Needs: Not on file  Physical Activity: Not on file  Stress: Not on file  Social Connections: Not on file  Intimate Partner Violence: Not on file   Family History  Problem Relation Age of Onset   Heart disease Mother        before age 25   Diabetes Mother    Hyperlipidemia Mother    Hypertension Mother    Heart attack Mother    Heart disease Father        Before age 85   Hyperlipidemia Father    Hypertension Father    Heart attack Father    Heart disease Sister        Before age 15   Heart attack Sister    Cancer - Colon Sister    Hyperlipidemia Brother    Hypertension Brother    Heart attack Brother    Heart disease Brother        before age 43   Heart disease Other    Diabetes Other    Hypertension Other     Alcohol abuse Other    Mental illness Other         Afton Albright, MD 02/07/24 1421

## 2024-02-07 NOTE — Discharge Instructions (Signed)
 You have had labs (urine culture) sent today. We will call you with any significant abnormalities or if there is need to begin or change treatment or pursue further follow up.  You may also review your test results online through MyChart. If you do not have a MyChart account, instructions to sign up should be on your discharge paperwork.  Your blood pressure was noted to be elevated during your visit today. If you are currently taking medication for high blood pressure, please ensure you are taking this as directed. If you do not have a history of high blood pressure and your blood pressure remains persistently elevated, you may need to begin taking a medication at some point. You may return here within the next few days to recheck if unable to see your primary care provider or if you do not have a one.  BP (!) 181/103   Pulse 70   Temp 98 F (36.7 C)   Resp 20   SpO2 93%   BP Readings from Last 3 Encounters:  02/07/24 (!) 181/103  01/30/24 (!) 144/82  11/13/22 (!) 185/99

## 2024-02-08 LAB — URINE CULTURE: Culture: <10000 — AB

## 2024-02-13 DIAGNOSIS — Z8673 Personal history of transient ischemic attack (TIA), and cerebral infarction without residual deficits: Secondary | ICD-10-CM | POA: Diagnosis not present

## 2024-02-13 DIAGNOSIS — Z951 Presence of aortocoronary bypass graft: Secondary | ICD-10-CM | POA: Diagnosis not present

## 2024-02-13 DIAGNOSIS — I10 Essential (primary) hypertension: Secondary | ICD-10-CM | POA: Diagnosis not present

## 2024-02-17 ENCOUNTER — Other Ambulatory Visit: Payer: Self-pay | Admitting: Cardiovascular Disease

## 2024-02-19 ENCOUNTER — Encounter (INDEPENDENT_AMBULATORY_CARE_PROVIDER_SITE_OTHER): Admitting: Ophthalmology

## 2024-03-01 DIAGNOSIS — E1169 Type 2 diabetes mellitus with other specified complication: Secondary | ICD-10-CM | POA: Diagnosis not present

## 2024-03-01 DIAGNOSIS — G4733 Obstructive sleep apnea (adult) (pediatric): Secondary | ICD-10-CM | POA: Diagnosis not present

## 2024-03-01 DIAGNOSIS — I10 Essential (primary) hypertension: Secondary | ICD-10-CM | POA: Diagnosis not present

## 2024-03-01 DIAGNOSIS — E785 Hyperlipidemia, unspecified: Secondary | ICD-10-CM | POA: Diagnosis not present

## 2024-03-01 DIAGNOSIS — I2510 Atherosclerotic heart disease of native coronary artery without angina pectoris: Secondary | ICD-10-CM | POA: Diagnosis not present

## 2024-03-01 DIAGNOSIS — Z8673 Personal history of transient ischemic attack (TIA), and cerebral infarction without residual deficits: Secondary | ICD-10-CM | POA: Diagnosis not present

## 2024-03-02 DIAGNOSIS — I10 Essential (primary) hypertension: Secondary | ICD-10-CM | POA: Diagnosis not present

## 2024-03-02 DIAGNOSIS — E1169 Type 2 diabetes mellitus with other specified complication: Secondary | ICD-10-CM | POA: Diagnosis not present

## 2024-03-02 DIAGNOSIS — E11319 Type 2 diabetes mellitus with unspecified diabetic retinopathy without macular edema: Secondary | ICD-10-CM | POA: Diagnosis not present

## 2024-03-02 DIAGNOSIS — E785 Hyperlipidemia, unspecified: Secondary | ICD-10-CM | POA: Diagnosis not present

## 2024-03-11 ENCOUNTER — Encounter: Payer: Self-pay | Admitting: Cardiovascular Disease

## 2024-03-14 ENCOUNTER — Telehealth: Payer: Self-pay

## 2024-03-14 ENCOUNTER — Telehealth: Payer: Self-pay | Admitting: Cardiovascular Disease

## 2024-03-14 DIAGNOSIS — I739 Peripheral vascular disease, unspecified: Secondary | ICD-10-CM

## 2024-03-14 NOTE — Telephone Encounter (Signed)
 Patient called 03/13/2024 asking about making an appt with Dr. Charlotte Cookey for leg pain.  Returned call to pt 03/14/24 to discuss. Pt is a patient of Dr. Katheryne Pane and has not seen Dr. Charlotte Cookey since 2014. Advised pt to call Dr Arlester Ladd office to get a referral.  Pt reported bilateral lower extremity pain in her calves and lower thighs when she walks. Pt reported no swelling, skin color or temperature changes. Pt reported no ulcers on her feet or legs.  Pt advised to:   Contact a health care provider if: Your pain doesn't go away with rest. You have sores on your legs that: Don't heal. Have pus or a bad smell. Your condition gets worse or doesn't improve with treatment. Get help right away if: You have chest pain. You have trouble breathing. Your foot or leg is cold or it changes color. Your foot or leg gets numb. You have any signs of a stroke. BE FAST is an easy way to remember the main warning signs: B - Balance. Feeling dizzy, sudden trouble walking, or loss of balance. E - Eyes. Trouble seeing or a change in how you see. F - Face. Sudden weakness or feeling numb in the face. The face or eyelid may droop on one side. A - Arms. Weakness or loss of feeling in an arm. This happens fast and often only on one side. S - Speech. Sudden trouble speaking, slurred speech, or trouble understanding what people say. T - Time. Time to call 911. Write down what time symptoms started. Other signs of a stroke can be: A sudden, very bad headache with no known cause. Feeling like you may throw up. Throwing up. These symptoms may be an emergency. Call 911 right away. Do not wait to see if the symptoms will go away. Do not drive yourself to the hospital.

## 2024-03-14 NOTE — Telephone Encounter (Signed)
 Patient stated Dr. Mick Alamin office wants a referral sent as she had not been seen in a while.

## 2024-03-14 NOTE — Telephone Encounter (Signed)
 Attempted to contact patient, no answer and unable to leave message due to mailbox being full.  Will send MyChart message.  Referral to Dr. Charlotte Cookey placed.

## 2024-03-15 NOTE — Telephone Encounter (Signed)
Spoke with pt, aware referral has been placed

## 2024-03-20 DIAGNOSIS — G4733 Obstructive sleep apnea (adult) (pediatric): Secondary | ICD-10-CM | POA: Diagnosis not present

## 2024-03-26 DIAGNOSIS — G4733 Obstructive sleep apnea (adult) (pediatric): Secondary | ICD-10-CM | POA: Diagnosis not present

## 2024-04-01 DIAGNOSIS — E11319 Type 2 diabetes mellitus with unspecified diabetic retinopathy without macular edema: Secondary | ICD-10-CM | POA: Diagnosis not present

## 2024-04-01 DIAGNOSIS — E1169 Type 2 diabetes mellitus with other specified complication: Secondary | ICD-10-CM | POA: Diagnosis not present

## 2024-04-01 DIAGNOSIS — I10 Essential (primary) hypertension: Secondary | ICD-10-CM | POA: Diagnosis not present

## 2024-04-01 DIAGNOSIS — E785 Hyperlipidemia, unspecified: Secondary | ICD-10-CM | POA: Diagnosis not present

## 2024-04-11 DIAGNOSIS — G4733 Obstructive sleep apnea (adult) (pediatric): Secondary | ICD-10-CM | POA: Diagnosis not present

## 2024-04-26 ENCOUNTER — Other Ambulatory Visit: Payer: Self-pay | Admitting: Surgery

## 2024-04-26 DIAGNOSIS — M79606 Pain in leg, unspecified: Secondary | ICD-10-CM

## 2024-05-02 DIAGNOSIS — E1169 Type 2 diabetes mellitus with other specified complication: Secondary | ICD-10-CM | POA: Diagnosis not present

## 2024-05-02 DIAGNOSIS — I10 Essential (primary) hypertension: Secondary | ICD-10-CM | POA: Diagnosis not present

## 2024-05-02 DIAGNOSIS — E11319 Type 2 diabetes mellitus with unspecified diabetic retinopathy without macular edema: Secondary | ICD-10-CM | POA: Diagnosis not present

## 2024-05-02 DIAGNOSIS — E785 Hyperlipidemia, unspecified: Secondary | ICD-10-CM | POA: Diagnosis not present

## 2024-05-06 ENCOUNTER — Encounter: Admitting: Surgery

## 2024-05-06 ENCOUNTER — Encounter (HOSPITAL_COMMUNITY)

## 2024-05-12 DIAGNOSIS — G4733 Obstructive sleep apnea (adult) (pediatric): Secondary | ICD-10-CM | POA: Diagnosis not present

## 2024-05-13 ENCOUNTER — Telehealth (INDEPENDENT_AMBULATORY_CARE_PROVIDER_SITE_OTHER): Admitting: Adult Health

## 2024-05-13 ENCOUNTER — Encounter: Payer: Self-pay | Admitting: Adult Health

## 2024-05-13 DIAGNOSIS — G4700 Insomnia, unspecified: Secondary | ICD-10-CM | POA: Diagnosis not present

## 2024-05-13 DIAGNOSIS — F3342 Major depressive disorder, recurrent, in full remission: Secondary | ICD-10-CM

## 2024-05-13 DIAGNOSIS — F32A Depression, unspecified: Secondary | ICD-10-CM

## 2024-05-13 DIAGNOSIS — F419 Anxiety disorder, unspecified: Secondary | ICD-10-CM

## 2024-05-13 DIAGNOSIS — F410 Panic disorder [episodic paroxysmal anxiety] without agoraphobia: Secondary | ICD-10-CM

## 2024-05-13 MED ORDER — SERTRALINE HCL 100 MG PO TABS: 100.0000 mg | ORAL_TABLET | Freq: Two times a day (BID) | ORAL | 3 refills | Status: AC

## 2024-05-13 MED ORDER — ALPRAZOLAM 0.5 MG PO TABS: ORAL_TABLET | ORAL | 2 refills | Status: AC

## 2024-05-13 NOTE — Progress Notes (Addendum)
 Alicia Crawford [id number] March 31, 1956 68 y.o.  Virtual Visit via Video Note  I connected with pt @ on 05/13/24 at  8:00 AM EDT by a video enabled telemedicine application and verified that I am speaking with the correct person using two identifiers.   I discussed the limitations of evaluation and management by telemedicine and the availability of in person appointments. The patient expressed understanding and agreed to proceed.  I discussed the assessment and treatment plan with the patient. The patient was provided an opportunity to ask questions and all were answered. The patient agreed with the plan and demonstrated an understanding of the instructions.   The patient was advised to call back or seek an in-person evaluation if the symptoms worsen or if the condition fails to improve as anticipated.  I provided 30 minutes of non-face-to-face time during this encounter.  The patient was located at home.  The provider was located at [facility].   Angeline LOISE Sayers, NP   Subjective:   Patient ID:  Alicia Crawford is a 68 y.o. (DOB 11-17-1955) female.  Chief Complaint: No chief complaint on file.   HPI Alicia Crawford presents for follow-up of anxiety, depression and insomnia.   Describes mood today as ok. Pleasant. Mood symptoms - denies depression, anxiety and irritability. Reports stable interest and motivation. Denies panic attacks. Reports some worry, rumination and over thinking - trying to get that under control. Denies obsessive thoughts or acts. Reports mood as stable. Stating I feel like I'm doing ok. Taking medications as prescribed.  Energy levels stable. Active, has a regular exercise routine - walking a mile every day.  Enjoys some usual interests and activities. Married. Lives with husband - 2 dogs. Spending time with family. Appetite adequate. Weight stable. Sleeps well most nights. Averages 7 hours. Focus and concentration stable. Completing tasks. Managing  aspects of household. Retired. Denies SI or HI.  Denies AH or VH. Denies self harm. Denies substance use.   Alprazolam  last filled 03/20/2024.   Past Psychiatric Medication Trials: Xanax - Helpful Ativan  Sertraline - started in 2013 Prozac- took for years and then stopped working Topamax  Trazodone   Review of Systems:  Review of Systems  Musculoskeletal:  Negative for gait problem.  Neurological:  Negative for tremors.  Psychiatric/Behavioral:         Please refer to HPI    Medications: I have reviewed the patient's current medications.  Current Outpatient Medications  Medication Sig Dispense Refill   ALPRAZolam  (XANAX ) 0.5 MG tablet Take 1/2-1 tablet twice daily as needed for severe anxiety 60 tablet 5   amLODipine  (NORVASC ) 5 MG tablet TAKE 1 TABLET(5 MG) BY MOUTH DAILY 90 tablet 3   Ascorbic Acid  (VITAMIN C ) 100 MG tablet Take 100 mg by mouth daily.     atorvastatin  (LIPITOR ) 20 MG tablet Take 1 tablet (20 mg total) by mouth daily. 90 tablet 3   Bacillus Coagulans-Inulin (PROBIOTIC-PREBIOTIC PO) Take by mouth.     cephALEXin  (KEFLEX ) 500 MG capsule Take 1 capsule (500 mg total) by mouth 2 (two) times daily. 10 capsule 0   cholecalciferol  (VITAMIN D ) 25 MCG (1000 UNIT) tablet Take 1,000 Units by mouth daily.     cilostazol  (PLETAL ) 100 MG tablet TAKE ONE TABLET BY MOUTH TWICE DAILY BEFORE A MEAL. 90 tablet 3   Coenzyme Q10 300 MG CAPS Take 300 mg by mouth daily.     Evolocumab  (REPATHA  SURECLICK) 140 MG/ML SOAJ ADMINISTER 1 ML UNDER THE SKIN EVERY 14 DAYS 2 mL  5   magnesium  oxide (MAG-OX) 400 MG tablet Take 400 mg by mouth 2 (two) times daily.     metFORMIN  (GLUCOPHAGE ) 1000 MG tablet Take 1,000 mg by mouth daily.  0   metoprolol  tartrate (LOPRESSOR ) 25 MG tablet TAKE 1 TABLET(25 MG) BY MOUTH TWICE DAILY (Patient taking differently: Take 25 mg by mouth 2 (two) times daily.) 180 tablet 3   sertraline  (ZOLOFT ) 100 MG tablet Take 1 tablet (100 mg total) by mouth in the morning  and at bedtime. 180 tablet 3   traZODone  (DESYREL ) 100 MG tablet Take 1/2-1 tablet po QHS prn insomnia 90 tablet 3   zinc  gluconate 50 MG tablet Take 50 mg by mouth daily.     No current facility-administered medications for this visit.    Medication Side Effects: None  Allergies:  Allergies  Allergen Reactions   Halothane Hives and Other (See Comments)    Gas used 1980's Reaction: affected her liver    Past Medical History:  Diagnosis Date   Ankle fracture 09/05/2016   right ankle   Anoxic brain injury (HCC) 08/2010   w/cardiac arrest   Anxiety 10/2010   panic disorder   Bilateral hypertensive retinopathy 2020   Blood transfusion 09/2010   Cardiac arrest (HCC) 08/04/2010   witnessed cardiac arrest, with CPR & defibrillation, and ultimately coronary artery bypass grafting on 09/27/2010. She had a prolonged hospitalization on a ventilalator and rehab.   Carotid artery occlusion    hx of moderate bilateral ICA stenosis by duplex ultrasound 06/02/2021   Chronic kidney disease    stage 3   Claudication [facility])    life-style limiting claudication   Complication of anesthesia    Halothan, given in the 1980's, affected liver   Coronary artery disease    CABG 2011 in [place], Pt follows with Dr. Dorn Lesches, cardiologist, LOV 05/17/22 in Epic.   Depression    GERD (gastroesophageal reflux disease)    Hepatitis    1980's due to being given Halothane during anesthesia per pt   High cholesterol    Hyperkalemia 03/2021   03/18/21 K+ 6.5   Hypertension    Follows with cardiologist, Dorn Lesches, MD., LOV 05/17/22 in Epic.   Myocardial infarction [facility]) 09/27/2010   in [place]    Obesity    Peripheral vascular disease (HCC)    bilat SFA diseae   Renal artery stenosis (HCC)    35% right renal artery stenosis, revascularization was recommended, however patient fell and developed a subdural hematoma, workup was put on hold, medical therapy was recommended per 05/17/22  cardiology OV note in Epic from Dr. Dorn Lesches.   Rotator cuff tendonitis    left   Sleep apnea    uses CPAP nightly   Stroke [facility]) 2011   no residual deficits   Subdural hematoma (HCC) 02/25/2012   Frontal subdural  S/P evacuation of hematoma   Type II diabetes mellitus (HCC)    takes Metformin    UTI (urinary tract infection) 03/2021   hospitalized for acute kidney injury, was found to have a Klebsiella UTI   Wears glasses    reading only    Family History  Problem Relation Age of Onset   Heart disease Mother        before age 42   Diabetes Mother    Hyperlipidemia Mother    Hypertension Mother    Heart attack Mother    Heart disease Father        Before age 72  Hyperlipidemia Father    Hypertension Father    Heart attack Father    Heart disease Sister        Before age 49   Heart attack Sister    Cancer - Colon Sister    Hyperlipidemia Brother    Hypertension Brother    Heart attack Brother    Heart disease Brother        before age 53   Heart disease Other    Diabetes Other    Hypertension Other    Alcohol abuse Other    Mental illness Other     Social History   Socioeconomic History   Marital status: Married    Spouse name: Not on file   Number of children: Not on file   Years of education: Not on file   Highest education level: Not on file  Occupational History   Not on file  Tobacco Use   Smoking status: Former    Current packs/day: 0.00    Average packs/day: 1 pack/day for 30.0 years (30.0 ttl pk-yrs)    Types: Cigarettes    Start date: 06/22/1973    Quit date: 06/23/2003    Years since quitting: 20.9   Smokeless tobacco: Never  Vaping Use   Vaping status: Never Used  Substance and Sexual Activity   Alcohol use: Not Currently   Drug use: No   Sexual activity: Yes    Birth control/protection: None  Other Topics Concern   Not on file  Social History Narrative   Not on file   Social Drivers of Health   Financial Resource Strain:  Not on file  Food Insecurity: Not on file  Transportation Needs: Not on file  Physical Activity: Not on file  Stress: Not on file  Social Connections: Not on file  Intimate Partner Violence: Not on file    Past Medical History, Surgical history, Social history, and Family history were reviewed and updated as appropriate.   Please see review of systems for further details on the patient's review from today.   Objective:   Physical Exam:  There were no vitals taken for this visit.  Physical Exam Constitutional:      General: She is not in acute distress. Musculoskeletal:        General: No deformity.  Neurological:     Mental Status: She is alert and oriented to person, place, and time.     Coordination: Coordination normal.  Psychiatric:        Attention and Perception: Attention and perception normal. She does not perceive auditory or visual hallucinations.        Mood and Affect: Mood normal. Mood is not anxious or depressed. Affect is not labile, blunt, angry or inappropriate.        Speech: Speech normal.        Behavior: Behavior normal.        Thought Content: Thought content normal. Thought content is not paranoid or delusional. Thought content does not include homicidal or suicidal ideation. Thought content does not include homicidal or suicidal plan.        Cognition and Memory: Cognition and memory normal.        Judgment: Judgment normal.     Comments: Insight intact     Lab Review:     Component Value Date/Time   NA 135 06/10/2022 1100   K 4.1 06/10/2022 1100   CL 108 06/10/2022 1100   CO2 20 (L) 06/10/2022 1100   GLUCOSE 110 (H)  06/10/2022 1100   BUN 31 (H) 06/10/2022 1100   CREATININE 0.75 06/10/2022 1100   CALCIUM  9.5 06/10/2022 1100   PROT 6.7 03/21/2021 0051   ALBUMIN  3.2 (L) 03/21/2021 0051   AST 18 03/21/2021 0051   ALT 25 03/21/2021 0051   ALKPHOS 61 03/21/2021 0051   BILITOT 0.7 03/21/2021 0051   GFRNONAA >60 06/10/2022 1100   GFRAA 60 (L)  04/22/2019 1227       Component Value Date/Time   WBC 7.5 06/10/2022 1100   RBC 4.08 06/10/2022 1100   HGB 12.4 06/10/2022 1100   HCT 37.9 06/10/2022 1100   PLT 397 06/10/2022 1100   MCV 92.9 06/10/2022 1100   MCH 30.4 06/10/2022 1100   MCHC 32.7 06/10/2022 1100   RDW 13.9 06/10/2022 1100   LYMPHSABS 2.9 03/21/2021 0051   MONOABS 0.4 03/21/2021 0051   EOSABS 0.1 03/21/2021 0051   BASOSABS 0.1 03/21/2021 0051    No results found for: POCLITH, LITHIUM   No results found for: PHENYTOIN, PHENOBARB, VALPROATE, CBMZ   .res Assessment: Plan:    30 minutes spent dedicated to the care of this patient on the date of this encounter to include pre-visit review of records, ordering of medication, post visit documentation, and face-to-face time with the patient discussing ongoing treatment plan. Pt reports that her mood, anxiety, and insomnia has been well controlled and she would like to continue current medications without changes at this time.  Continue Sertraline  100 mg twice daily for depression and anxiety.  Continue Alprazolam  0.5 mg 1/2-1 tablet twice daily as needed for severe anxiety.   Discussed potential benefits, risk, and side effects of benzodiazepines to include potential risk of tolerance and dependence, as well as possible drowsiness.  Advised patient not to drive if experiencing drowsiness and to take lowest possible effective dose to minimize risk of dependence and tolerance.   Pt to follow-up in 6 months or sooner if clinically indicated.   Patient advised to contact office with any questions, adverse effects, or acute worsening in signs and symptoms.  There are no diagnoses linked to this encounter.   Please see After Visit Summary for patient specific instructions.  Future Appointments  Date Time Provider Department Center  05/13/2024  8:00 AM Kortney Potvin, Angeline Mattocks, NP CP-CP None  05/20/2024 10:00 AM [facility] 6 [facility]  05/20/2024 11:00 AM  Serene Gaile ORN, MD [facility] H&V  06/19/2024  1:30 PM [facility] 4 [facility] H[facility]V    No orders of the defined types were placed in this encounter.     -------------------------------

## 2024-05-20 ENCOUNTER — Ambulatory Visit (INDEPENDENT_AMBULATORY_CARE_PROVIDER_SITE_OTHER): Admitting: Surgery

## 2024-05-20 ENCOUNTER — Encounter: Payer: Self-pay | Admitting: Surgery

## 2024-05-20 ENCOUNTER — Ambulatory Visit (HOSPITAL_COMMUNITY)
Admission: RE | Admit: 2024-05-20 | Discharge: 2024-05-20 | Disposition: A | Discharge status: Home / Self Care | Source: Ambulatory Visit | Attending: Surgery | Admitting: Surgery

## 2024-05-20 VITALS — BP 147/72 | HR 63 | Temp 98.0°F | Resp 18 | Ht 64.0 in | Wt 175.0 lb

## 2024-05-20 DIAGNOSIS — M79606 Pain in leg, unspecified: Secondary | ICD-10-CM | POA: Insufficient documentation

## 2024-05-20 DIAGNOSIS — I70213 Atherosclerosis of native arteries of extremities with intermittent claudication, bilateral legs: Secondary | ICD-10-CM | POA: Insufficient documentation

## 2024-05-20 DIAGNOSIS — I6523 Occlusion and stenosis of bilateral carotid arteries: Secondary | ICD-10-CM | POA: Diagnosis not present

## 2024-05-20 LAB — VAS US ABI WITH/WO TBI
Left ABI: 0.88
Right ABI: 0.94

## 2024-05-20 NOTE — Progress Notes (Signed)
 [facility] [place]  Patient name: Alicia Crawford MRN: [id number] DOB: Sep 16, 1956 Sex: female   REQUESTING PROVIDER:    Dr. Court   REASON FOR CONSULT:    PAD  HISTORY OF PRESENT ILLNESS:   Alicia Crawford is a 68 y.o. female, who is here today for evaluation of bilateral claudication.  I last saw her in 2014.  She originally came to me for evaluation of claudication.  She had undergone angiography that showed bilateral superficial femoral artery occlusion and a high-grade right femoral artery stenosis.  She was walking approximately 1 mile.  I started her on cilostazol  and had her follow-up a year later, where her symptoms remain stable.  She was then lost to follow-up and now resurfaces with similar complaints of claudication however now she is only walking approximately 1/4 mile.  She does not have open wounds or rest pain.  She wants to walk more.     The patient has a history of a witnessed cardiac arrest with CPR defibrillation which eventually led to coronary artery bypass graft in 2011.She is a former smoker.  She takes Repatha  for hypercholesterolemia  PAST MEDICAL HISTORY    Past Medical History:  Diagnosis Date   Ankle fracture 09/05/2016   right ankle   Anoxic brain injury (HCC) 08/2010   w/cardiac arrest   Anxiety 10/2010   panic disorder   Bilateral hypertensive retinopathy 2020   Blood transfusion 09/2010   Cardiac arrest (HCC) 08/04/2010   witnessed cardiac arrest, with CPR & defibrillation, and ultimately coronary artery bypass grafting on 09/27/2010. She had a prolonged hospitalization on a ventilalator and rehab.   Carotid artery occlusion    hx of moderate bilateral ICA stenosis by duplex ultrasound 06/02/2021   Chronic kidney disease    stage 3   Claudication [facility])    life-style limiting claudication   Complication of anesthesia    Halothan, given in the 1980's, affected liver   Coronary artery  disease    CABG 2011 in [place], Pt follows with Dr. Dorn Court, cardiologist, LOV 05/17/22 in Epic.   Depression    GERD (gastroesophageal reflux disease)    Hepatitis    1980's due to being given Halothane during anesthesia per pt   High cholesterol    Hyperkalemia 03/2021   03/18/21 K+ 6.5   Hypertension    Follows with cardiologist, Dorn Court, MD., LOV 05/17/22 in Epic.   Myocardial infarction [facility]) 09/27/2010   in [place]    Obesity    Peripheral vascular disease (HCC)    bilat SFA diseae   Renal artery stenosis (HCC)    35% right renal artery stenosis, revascularization was recommended, however patient fell and developed a subdural hematoma, workup was put on hold, medical therapy was recommended per 05/17/22 cardiology OV note in Epic from Dr. Dorn Court.   Rotator cuff tendonitis    left   Sleep apnea    uses CPAP nightly   Stroke [facility]) 2011   no residual deficits   Subdural hematoma (HCC) 02/25/2012   Frontal subdural  S/P evacuation of hematoma   Type II diabetes mellitus (HCC)    takes Metformin    UTI (urinary tract infection) 03/2021   hospitalized for acute kidney injury, was found to have a Klebsiella UTI   Wears glasses    reading only     FAMILY HISTORY   Family History  Problem Relation Age of Onset   Heart disease Mother  before age 61   Diabetes Mother    Hyperlipidemia Mother    Hypertension Mother    Heart attack Mother    Heart disease Father        Before age 51   Hyperlipidemia Father    Hypertension Father    Heart attack Father    Heart disease Sister        Before age 55   Heart attack Sister    Cancer - Colon Sister    Hyperlipidemia Brother    Hypertension Brother    Heart attack Brother    Heart disease Brother        before age 69   Heart disease Other    Diabetes Other    Hypertension Other    Alcohol abuse Other    Mental illness Other     SOCIAL HISTORY:   Social History   Socioeconomic  History   Marital status: Married    Spouse name: Not on file   Number of children: Not on file   Years of education: Not on file   Highest education level: Not on file  Occupational History   Not on file  Tobacco Use   Smoking status: Former    Current packs/day: 0.00    Average packs/day: 1 pack/day for 30.0 years (30.0 ttl pk-yrs)    Types: Cigarettes    Start date: 06/22/1973    Quit date: 06/23/2003    Years since quitting: 20.9   Smokeless tobacco: Never  Vaping Use   Vaping status: Never Used  Substance and Sexual Activity   Alcohol use: Not Currently   Drug use: No   Sexual activity: Yes    Birth control/protection: None  Other Topics Concern   Not on file  Social History Narrative   Not on file   Social Drivers of Health   Financial Resource Strain: Not on file  Food Insecurity: Not on file  Transportation Needs: Not on file  Physical Activity: Not on file  Stress: Not on file  Social Connections: Not on file  Intimate Partner Violence: Not on file    ALLERGIES:    Allergies  Allergen Reactions   Halothane Hives and Other (See Comments)    Gas used 1980's Reaction: affected her liver    CURRENT MEDICATIONS:    Current Outpatient Medications  Medication Sig Dispense Refill   ALPRAZolam  (XANAX ) 0.5 MG tablet Take 1/2-1 tablet twice daily as needed for severe anxiety 60 tablet 2   Ascorbic Acid  (VITAMIN C ) 100 MG tablet Take 100 mg by mouth daily.     Bacillus Coagulans-Inulin (PROBIOTIC-PREBIOTIC PO) Take by mouth.     cephALEXin  (KEFLEX ) 500 MG capsule Take 1 capsule (500 mg total) by mouth 2 (two) times daily. 10 capsule 0   cholecalciferol  (VITAMIN D ) 25 MCG (1000 UNIT) tablet Take 1,000 Units by mouth daily.     cilostazol  (PLETAL ) 100 MG tablet TAKE ONE TABLET BY MOUTH TWICE DAILY BEFORE A MEAL. 90 tablet 3   Coenzyme Q10 300 MG CAPS Take 300 mg by mouth daily.     Evolocumab  (REPATHA  SURECLICK) 140 MG/ML SOAJ ADMINISTER 1 ML UNDER THE SKIN  EVERY 14 DAYS 2 mL 5   magnesium  oxide (MAG-OX) 400 MG tablet Take 400 mg by mouth 2 (two) times daily.     sertraline  (ZOLOFT ) 100 MG tablet Take 1 tablet (100 mg total) by mouth in the morning and at bedtime. 180 tablet 3   zinc  gluconate 50 MG tablet  Take 50 mg by mouth daily.     No current facility-administered medications for this visit.    REVIEW OF SYSTEMS:   [X]  denotes positive finding, [ ]  denotes negative finding Cardiac  Comments:  Chest pain or chest pressure:    Shortness of breath upon exertion:    Short of breath when lying flat:    Irregular heart rhythm:        Vascular    Pain in calf, thigh, or hip brought on by ambulation: x   Pain in feet at night that wakes you up from your sleep:     Blood clot in your veins:    Leg swelling:         Pulmonary    Oxygen at home:    Productive cough:     Wheezing:         Neurologic    Sudden weakness in arms or legs:     Sudden numbness in arms or legs:     Sudden onset of difficulty speaking or slurred speech:    Temporary loss of vision in one eye:     Problems with dizziness:         Gastrointestinal    Blood in stool:      Vomited blood:         Genitourinary    Burning when urinating:     Blood in urine:        Psychiatric    Major depression:         Hematologic    Bleeding problems:    Problems with blood clotting too easily:        Skin    Rashes or ulcers:        Constitutional    Fever or chills:     PHYSICAL EXAM:   Vitals:   05/20/24 1046  BP: (!) 147/72  Pulse: 63  Resp: 18  Temp: 98 F (36.7 C)  TempSrc: Temporal  SpO2: 98%  Weight: 175 lb (79.4 kg)  Height: 5' 4 (1.626 m)    GENERAL: The patient is a well-nourished female, in no acute distress. The vital signs are documented above. CARDIAC: There is a regular rate and rhythm.  VASCULAR: Nonpalpable pedal pulses PULMONARY: Nonlabored respirations MUSCULOSKELETAL: There are no major deformities or cyanosis. NEUROLOGIC:  No focal weakness or paresthesias are detected. SKIN: There are no ulcers or rashes noted. PSYCHIATRIC: The patient has a normal affect.  STUDIES:   I have reviewed the following: +-------+-----------+-----------+------------+------------+  ABI/TBIToday's ABIToday's TBIPrevious ABIPrevious TBI  +-------+-----------+-----------+------------+------------+  Right .94        .47        .77         .81           +-------+-----------+-----------+------------+------------+  Left  .88        .51        .91         .59           +-------+-----------+-----------+------------+------------+  Right toe pressure: 75 Left toe pressure: 81 Monophasic waveforms  ASSESSMENT and PLAN   PAD with claudication: The patient continues to walk approximately quarter mile before she gets symptoms in both calfs.  She has been started on full to continue walking.  I do not think there is any indication for revascularization at this time.  She knows to contact me should she develop nonhealing wounds.  I will have her return to see me in 1 year  with ABIs.  She is also going to get a repeat carotid because this has not been evaluated in a while and she had moderate disease in the past.   Malvina New, IV, MD, FACS [facility] [phone] Pager [phone]

## 2024-06-02 DIAGNOSIS — E11319 Type 2 diabetes mellitus with unspecified diabetic retinopathy without macular edema: Secondary | ICD-10-CM | POA: Diagnosis not present

## 2024-06-02 DIAGNOSIS — I10 Essential (primary) hypertension: Secondary | ICD-10-CM | POA: Diagnosis not present

## 2024-06-02 DIAGNOSIS — E1169 Type 2 diabetes mellitus with other specified complication: Secondary | ICD-10-CM | POA: Diagnosis not present

## 2024-06-02 DIAGNOSIS — E785 Hyperlipidemia, unspecified: Secondary | ICD-10-CM | POA: Diagnosis not present

## 2024-06-12 DIAGNOSIS — G4733 Obstructive sleep apnea (adult) (pediatric): Secondary | ICD-10-CM | POA: Diagnosis not present

## 2024-06-18 DIAGNOSIS — F329 Major depressive disorder, single episode, unspecified: Secondary | ICD-10-CM | POA: Diagnosis not present

## 2024-06-18 DIAGNOSIS — G4733 Obstructive sleep apnea (adult) (pediatric): Secondary | ICD-10-CM | POA: Diagnosis not present

## 2024-06-18 DIAGNOSIS — I10 Essential (primary) hypertension: Secondary | ICD-10-CM | POA: Diagnosis not present

## 2024-06-19 ENCOUNTER — Ambulatory Visit (HOSPITAL_COMMUNITY)
Admission: RE | Admit: 2024-06-19 | Discharge: 2024-06-19 | Disposition: A | Discharge status: Home / Self Care | Source: Ambulatory Visit | Attending: Cardiovascular Disease | Admitting: Cardiovascular Disease

## 2024-06-19 DIAGNOSIS — M21371 Foot drop, right foot: Secondary | ICD-10-CM | POA: Diagnosis not present

## 2024-06-19 DIAGNOSIS — I6523 Occlusion and stenosis of bilateral carotid arteries: Secondary | ICD-10-CM | POA: Insufficient documentation

## 2024-06-19 DIAGNOSIS — M19071 Primary osteoarthritis, right ankle and foot: Secondary | ICD-10-CM | POA: Diagnosis not present

## 2024-06-20 ENCOUNTER — Ambulatory Visit: Payer: Self-pay | Admitting: Cardiovascular Disease

## 2024-07-02 DIAGNOSIS — I10 Essential (primary) hypertension: Secondary | ICD-10-CM | POA: Diagnosis not present

## 2024-07-02 DIAGNOSIS — E1169 Type 2 diabetes mellitus with other specified complication: Secondary | ICD-10-CM | POA: Diagnosis not present

## 2024-07-02 DIAGNOSIS — E785 Hyperlipidemia, unspecified: Secondary | ICD-10-CM | POA: Diagnosis not present

## 2024-07-02 DIAGNOSIS — E11319 Type 2 diabetes mellitus with unspecified diabetic retinopathy without macular edema: Secondary | ICD-10-CM | POA: Diagnosis not present

## 2024-07-08 ENCOUNTER — Encounter (INDEPENDENT_AMBULATORY_CARE_PROVIDER_SITE_OTHER): Admitting: Ophthalmology

## 2024-07-08 DIAGNOSIS — Z7984 Long term (current) use of oral hypoglycemic drugs: Secondary | ICD-10-CM

## 2024-07-08 DIAGNOSIS — E113312 Type 2 diabetes mellitus with moderate nonproliferative diabetic retinopathy with macular edema, left eye: Secondary | ICD-10-CM | POA: Diagnosis not present

## 2024-07-08 DIAGNOSIS — H35033 Hypertensive retinopathy, bilateral: Secondary | ICD-10-CM | POA: Diagnosis not present

## 2024-07-08 DIAGNOSIS — E113391 Type 2 diabetes mellitus with moderate nonproliferative diabetic retinopathy without macular edema, right eye: Secondary | ICD-10-CM | POA: Diagnosis not present

## 2024-07-08 DIAGNOSIS — H43813 Vitreous degeneration, bilateral: Secondary | ICD-10-CM

## 2024-07-08 DIAGNOSIS — I10 Essential (primary) hypertension: Secondary | ICD-10-CM

## 2024-07-08 DIAGNOSIS — H33301 Unspecified retinal break, right eye: Secondary | ICD-10-CM | POA: Diagnosis not present

## 2024-07-09 DIAGNOSIS — E11319 Type 2 diabetes mellitus with unspecified diabetic retinopathy without macular edema: Secondary | ICD-10-CM | POA: Diagnosis not present

## 2024-07-09 DIAGNOSIS — I739 Peripheral vascular disease, unspecified: Secondary | ICD-10-CM | POA: Diagnosis not present

## 2024-07-09 DIAGNOSIS — E1121 Type 2 diabetes mellitus with diabetic nephropathy: Secondary | ICD-10-CM | POA: Diagnosis not present

## 2024-07-09 DIAGNOSIS — Z0000 Encounter for general adult medical examination without abnormal findings: Secondary | ICD-10-CM | POA: Diagnosis not present

## 2024-07-09 DIAGNOSIS — I10 Essential (primary) hypertension: Secondary | ICD-10-CM | POA: Diagnosis not present

## 2024-07-09 DIAGNOSIS — E785 Hyperlipidemia, unspecified: Secondary | ICD-10-CM | POA: Diagnosis not present

## 2024-07-09 DIAGNOSIS — F329 Major depressive disorder, single episode, unspecified: Secondary | ICD-10-CM | POA: Diagnosis not present

## 2024-07-09 DIAGNOSIS — I700 Atherosclerosis of aorta: Secondary | ICD-10-CM | POA: Diagnosis not present

## 2024-07-29 DIAGNOSIS — M21371 Foot drop, right foot: Secondary | ICD-10-CM | POA: Diagnosis not present

## 2024-08-02 DIAGNOSIS — I10 Essential (primary) hypertension: Secondary | ICD-10-CM | POA: Diagnosis not present

## 2024-08-02 DIAGNOSIS — E1169 Type 2 diabetes mellitus with other specified complication: Secondary | ICD-10-CM | POA: Diagnosis not present

## 2024-08-02 DIAGNOSIS — E11319 Type 2 diabetes mellitus with unspecified diabetic retinopathy without macular edema: Secondary | ICD-10-CM | POA: Diagnosis not present

## 2024-08-02 DIAGNOSIS — E785 Hyperlipidemia, unspecified: Secondary | ICD-10-CM | POA: Diagnosis not present

## 2024-08-12 DIAGNOSIS — G4733 Obstructive sleep apnea (adult) (pediatric): Secondary | ICD-10-CM | POA: Diagnosis not present

## 2024-08-26 ENCOUNTER — Encounter (INDEPENDENT_AMBULATORY_CARE_PROVIDER_SITE_OTHER): Admitting: Ophthalmology

## 2024-08-26 DIAGNOSIS — E113312 Type 2 diabetes mellitus with moderate nonproliferative diabetic retinopathy with macular edema, left eye: Secondary | ICD-10-CM

## 2024-08-26 DIAGNOSIS — H43813 Vitreous degeneration, bilateral: Secondary | ICD-10-CM | POA: Diagnosis not present

## 2024-08-26 DIAGNOSIS — H2513 Age-related nuclear cataract, bilateral: Secondary | ICD-10-CM

## 2024-08-26 DIAGNOSIS — Z7984 Long term (current) use of oral hypoglycemic drugs: Secondary | ICD-10-CM | POA: Diagnosis not present

## 2024-08-26 DIAGNOSIS — E113391 Type 2 diabetes mellitus with moderate nonproliferative diabetic retinopathy without macular edema, right eye: Secondary | ICD-10-CM

## 2024-08-26 DIAGNOSIS — I10 Essential (primary) hypertension: Secondary | ICD-10-CM

## 2024-08-26 DIAGNOSIS — H35033 Hypertensive retinopathy, bilateral: Secondary | ICD-10-CM | POA: Diagnosis not present

## 2024-09-01 DIAGNOSIS — E1169 Type 2 diabetes mellitus with other specified complication: Secondary | ICD-10-CM | POA: Diagnosis not present

## 2024-09-01 DIAGNOSIS — I10 Essential (primary) hypertension: Secondary | ICD-10-CM | POA: Diagnosis not present

## 2024-09-01 DIAGNOSIS — E785 Hyperlipidemia, unspecified: Secondary | ICD-10-CM | POA: Diagnosis not present

## 2024-09-01 DIAGNOSIS — E11319 Type 2 diabetes mellitus with unspecified diabetic retinopathy without macular edema: Secondary | ICD-10-CM | POA: Diagnosis not present

## 2024-10-14 ENCOUNTER — Encounter (INDEPENDENT_AMBULATORY_CARE_PROVIDER_SITE_OTHER): Admitting: Ophthalmology

## 2024-10-24 ENCOUNTER — Other Ambulatory Visit: Payer: Self-pay | Admitting: Cardiovascular Disease

## 2024-10-30 ENCOUNTER — Other Ambulatory Visit: Payer: Self-pay | Admitting: Cardiovascular Disease

## 2024-10-31 NOTE — Telephone Encounter (Signed)
 In accordance with refill protocols, please review and address the following requirements before this medication refill can be authorized:  Labs

## 2024-11-04 ENCOUNTER — Other Ambulatory Visit: Payer: Self-pay | Admitting: Cardiovascular Disease

## 2024-11-13 ENCOUNTER — Telehealth: Admitting: Adult Health
